# Patient Record
Sex: Female | Born: 1965
Health system: Southern US, Community
[De-identification: ages and names within clinical notes are randomized; demographics above are authoritative.]

## PROBLEM LIST (undated history)

## (undated) DIAGNOSIS — G35 Multiple sclerosis: Secondary | ICD-10-CM

## (undated) DIAGNOSIS — G259 Extrapyramidal and movement disorder, unspecified: Secondary | ICD-10-CM

## (undated) DIAGNOSIS — E119 Type 2 diabetes mellitus without complications: Secondary | ICD-10-CM

## (undated) HISTORY — PX: WISDOM TOOTH EXTRACTION: SHX21

## (undated) HISTORY — DX: Extrapyramidal and movement disorder, unspecified: G25.9

## (undated) HISTORY — PX: UTERINE FIBROID SURGERY: SHX826

---

## 1998-01-23 ENCOUNTER — Ambulatory Visit (HOSPITAL_COMMUNITY): Admission: RE | Admit: 1998-01-23 | Discharge: 1998-01-23 | Payer: Self-pay | Admitting: Obstetrics and Gynecology

## 2000-06-16 ENCOUNTER — Ambulatory Visit (HOSPITAL_COMMUNITY): Admission: RE | Admit: 2000-06-16 | Discharge: 2000-06-16 | Payer: Self-pay | Admitting: Obstetrics and Gynecology

## 2000-06-16 ENCOUNTER — Encounter: Payer: Self-pay | Admitting: Obstetrics and Gynecology

## 2000-06-21 ENCOUNTER — Encounter: Payer: Self-pay | Admitting: Obstetrics and Gynecology

## 2000-06-21 ENCOUNTER — Inpatient Hospital Stay (HOSPITAL_COMMUNITY): Admission: AD | Admit: 2000-06-21 | Discharge: 2000-06-24 | Payer: Self-pay | Admitting: Obstetrics and Gynecology

## 2000-06-21 ENCOUNTER — Encounter (INDEPENDENT_AMBULATORY_CARE_PROVIDER_SITE_OTHER): Payer: Self-pay | Admitting: Specialist

## 2000-06-22 ENCOUNTER — Encounter: Payer: Self-pay | Admitting: Obstetrics and Gynecology

## 2001-03-22 ENCOUNTER — Encounter (INDEPENDENT_AMBULATORY_CARE_PROVIDER_SITE_OTHER): Payer: Self-pay | Admitting: Specialist

## 2001-03-22 ENCOUNTER — Encounter (INDEPENDENT_AMBULATORY_CARE_PROVIDER_SITE_OTHER): Payer: Self-pay | Admitting: *Deleted

## 2001-03-22 ENCOUNTER — Inpatient Hospital Stay (HOSPITAL_COMMUNITY): Admission: RE | Admit: 2001-03-22 | Discharge: 2001-03-24 | Payer: Self-pay | Admitting: Obstetrics and Gynecology

## 2002-01-18 ENCOUNTER — Other Ambulatory Visit: Admission: RE | Admit: 2002-01-18 | Discharge: 2002-01-18 | Payer: Self-pay | Admitting: Obstetrics and Gynecology

## 2002-01-31 ENCOUNTER — Encounter: Payer: Self-pay | Admitting: Obstetrics and Gynecology

## 2002-01-31 ENCOUNTER — Ambulatory Visit (HOSPITAL_COMMUNITY): Admission: RE | Admit: 2002-01-31 | Discharge: 2002-01-31 | Payer: Self-pay | Admitting: Obstetrics and Gynecology

## 2002-10-12 ENCOUNTER — Encounter: Payer: Self-pay | Admitting: Obstetrics and Gynecology

## 2002-10-12 ENCOUNTER — Ambulatory Visit (HOSPITAL_COMMUNITY): Admission: RE | Admit: 2002-10-12 | Discharge: 2002-10-12 | Payer: Self-pay | Admitting: Obstetrics and Gynecology

## 2002-10-17 ENCOUNTER — Ambulatory Visit (HOSPITAL_COMMUNITY): Admission: RE | Admit: 2002-10-17 | Discharge: 2002-10-17 | Payer: Self-pay | Admitting: Obstetrics and Gynecology

## 2002-11-16 ENCOUNTER — Encounter: Payer: Self-pay | Admitting: Obstetrics and Gynecology

## 2002-11-16 ENCOUNTER — Ambulatory Visit (HOSPITAL_COMMUNITY): Admission: RE | Admit: 2002-11-16 | Discharge: 2002-11-16 | Payer: Self-pay | Admitting: Obstetrics and Gynecology

## 2003-01-26 ENCOUNTER — Encounter: Payer: Self-pay | Admitting: Obstetrics and Gynecology

## 2003-01-26 ENCOUNTER — Ambulatory Visit (HOSPITAL_COMMUNITY): Admission: RE | Admit: 2003-01-26 | Discharge: 2003-01-26 | Payer: Self-pay | Admitting: Obstetrics and Gynecology

## 2003-03-16 ENCOUNTER — Inpatient Hospital Stay (HOSPITAL_COMMUNITY): Admission: AD | Admit: 2003-03-16 | Discharge: 2003-03-20 | Payer: Self-pay | Admitting: Obstetrics and Gynecology

## 2003-03-17 ENCOUNTER — Encounter (INDEPENDENT_AMBULATORY_CARE_PROVIDER_SITE_OTHER): Payer: Self-pay

## 2003-03-22 ENCOUNTER — Inpatient Hospital Stay (HOSPITAL_COMMUNITY): Admission: AD | Admit: 2003-03-22 | Discharge: 2003-03-22 | Payer: Self-pay | Admitting: Obstetrics and Gynecology

## 2003-03-22 ENCOUNTER — Encounter: Payer: Self-pay | Admitting: Obstetrics and Gynecology

## 2003-06-15 ENCOUNTER — Other Ambulatory Visit: Admission: RE | Admit: 2003-06-15 | Discharge: 2003-06-15 | Payer: Self-pay | Admitting: Obstetrics and Gynecology

## 2004-10-08 ENCOUNTER — Other Ambulatory Visit: Admission: RE | Admit: 2004-10-08 | Discharge: 2004-10-08 | Payer: Self-pay | Admitting: Obstetrics and Gynecology

## 2005-01-17 ENCOUNTER — Ambulatory Visit (HOSPITAL_COMMUNITY): Admission: RE | Admit: 2005-01-17 | Discharge: 2005-01-17 | Payer: Self-pay | Admitting: Obstetrics and Gynecology

## 2005-02-18 ENCOUNTER — Ambulatory Visit (HOSPITAL_COMMUNITY): Admission: RE | Admit: 2005-02-18 | Discharge: 2005-02-18 | Payer: Self-pay | Admitting: Obstetrics and Gynecology

## 2005-03-20 ENCOUNTER — Ambulatory Visit (HOSPITAL_COMMUNITY): Admission: RE | Admit: 2005-03-20 | Discharge: 2005-03-20 | Payer: Self-pay | Admitting: Obstetrics and Gynecology

## 2005-05-14 ENCOUNTER — Inpatient Hospital Stay (HOSPITAL_COMMUNITY): Admission: AD | Admit: 2005-05-14 | Discharge: 2005-05-14 | Payer: Self-pay | Admitting: Obstetrics and Gynecology

## 2005-05-15 ENCOUNTER — Inpatient Hospital Stay (HOSPITAL_COMMUNITY): Admission: AD | Admit: 2005-05-15 | Discharge: 2005-05-15 | Payer: Self-pay | Admitting: Obstetrics and Gynecology

## 2005-05-30 ENCOUNTER — Ambulatory Visit (HOSPITAL_COMMUNITY): Admission: RE | Admit: 2005-05-30 | Discharge: 2005-05-30 | Payer: Self-pay | Admitting: Obstetrics and Gynecology

## 2005-06-18 ENCOUNTER — Ambulatory Visit (HOSPITAL_COMMUNITY): Admission: RE | Admit: 2005-06-18 | Discharge: 2005-06-18 | Payer: Self-pay | Admitting: Obstetrics and Gynecology

## 2005-08-01 ENCOUNTER — Inpatient Hospital Stay (HOSPITAL_COMMUNITY): Admission: RE | Admit: 2005-08-01 | Discharge: 2005-08-04 | Payer: Self-pay | Admitting: Obstetrics and Gynecology

## 2005-08-06 ENCOUNTER — Inpatient Hospital Stay (HOSPITAL_COMMUNITY): Admission: AD | Admit: 2005-08-06 | Discharge: 2005-08-06 | Payer: Self-pay | Admitting: Obstetrics and Gynecology

## 2005-08-07 ENCOUNTER — Encounter: Admission: RE | Admit: 2005-08-07 | Discharge: 2005-08-20 | Payer: Self-pay | Admitting: Obstetrics and Gynecology

## 2005-08-07 ENCOUNTER — Inpatient Hospital Stay (HOSPITAL_COMMUNITY): Admission: AD | Admit: 2005-08-07 | Discharge: 2005-08-07 | Payer: Self-pay | Admitting: Obstetrics and Gynecology

## 2005-08-08 ENCOUNTER — Inpatient Hospital Stay (HOSPITAL_COMMUNITY): Admission: AD | Admit: 2005-08-08 | Discharge: 2005-08-08 | Payer: Self-pay | Admitting: Obstetrics and Gynecology

## 2005-11-19 ENCOUNTER — Other Ambulatory Visit: Admission: RE | Admit: 2005-11-19 | Discharge: 2005-11-19 | Payer: Self-pay | Admitting: Obstetrics and Gynecology

## 2007-03-09 ENCOUNTER — Ambulatory Visit (HOSPITAL_COMMUNITY): Admission: RE | Admit: 2007-03-09 | Discharge: 2007-03-09 | Payer: Self-pay | Admitting: Obstetrics and Gynecology

## 2007-03-25 ENCOUNTER — Encounter: Admission: RE | Admit: 2007-03-25 | Discharge: 2007-03-25 | Payer: Self-pay | Admitting: Obstetrics and Gynecology

## 2007-04-08 ENCOUNTER — Ambulatory Visit (HOSPITAL_COMMUNITY): Admission: RE | Admit: 2007-04-08 | Discharge: 2007-04-08 | Payer: Self-pay | Admitting: Obstetrics and Gynecology

## 2008-06-13 ENCOUNTER — Encounter: Admission: RE | Admit: 2008-06-13 | Discharge: 2008-06-13 | Payer: Self-pay | Admitting: Obstetrics and Gynecology

## 2009-07-11 ENCOUNTER — Encounter: Admission: RE | Admit: 2009-07-11 | Discharge: 2009-07-11 | Payer: Self-pay | Admitting: Obstetrics and Gynecology

## 2010-10-17 ENCOUNTER — Other Ambulatory Visit (HOSPITAL_COMMUNITY): Payer: Self-pay | Admitting: Obstetrics and Gynecology

## 2010-10-17 DIAGNOSIS — Z1231 Encounter for screening mammogram for malignant neoplasm of breast: Secondary | ICD-10-CM

## 2010-10-17 DIAGNOSIS — Z1239 Encounter for other screening for malignant neoplasm of breast: Secondary | ICD-10-CM

## 2010-11-06 ENCOUNTER — Ambulatory Visit (HOSPITAL_COMMUNITY)
Admission: RE | Admit: 2010-11-06 | Discharge: 2010-11-06 | Disposition: A | Discharge status: Home / Self Care | Payer: BC Managed Care – PPO | Source: Ambulatory Visit | Attending: Obstetrics and Gynecology | Admitting: Obstetrics and Gynecology

## 2010-11-06 DIAGNOSIS — Z1231 Encounter for screening mammogram for malignant neoplasm of breast: Secondary | ICD-10-CM | POA: Insufficient documentation

## 2011-01-28 NOTE — H&P (Signed)
NAMEMAGARET, JUSTO              ACCOUNT NO.:  [id number]   MEDICAL RECORD NO.:  [id number]          PATIENT TYPE:  AMB   LOCATION:  SDC                           [facility]ILITY:  [facility]   PHYSICIAN:  Hal Morales, M.D.DATE OF BIRTH:  02-Jun-1966   DATE OF ADMISSION:  04/08/2007  DATE OF DISCHARGE:                              HISTORY & PHYSICAL   HISTORY OF PRESENT ILLNESS:  Ms. Frary is a 45 year old married  African American female, para 2-0-1-2, who presents for open laparoscopy  to remove a misplaced IUD.  The patient had a Mirena IUD placed on  October 31, 2005.  The initial followup of this IUD placement did not  reveal a string.  However, an ultrasound on November 19, 2005 showed that  the IUD was centrally located within the endometrial cavity.  A year  later, during her annual exam, it was again noted that the patient's IUD  string was not visible, and a repeat ultrasound failed to show an IUD in  the endometrial cavity.  A subsequent x-ray of the abdomen showed the  IUD in the region of the right iliac bone.  The patient presents for  surgical removal of this device.   PAST MEDICAL HISTORY:   OB HISTORY:  Gravida 3, para 2-0-1-2.  The patient had a history of  incompetent cervix.  The patient also underwent cesarean sections.   GYN HISTORY:  Menarche 45 years old.  Last menstrual period March 29, 2007.  She denies any use of contraception at this time.  Denies any  history of sexually transmitted diseases.  Does have a history of  abnormal Pap smear in 2000.  However, subsequent Pap smears have been  within normal limits.  Her last normal Pap smear was January 2006.  Her  most recent Pap smear done on June 2008, results are pending.   MEDICAL HISTORY:  1. Multiple sclerosis.  2. Uterine fibroids.   SURGICAL HISTORY:  1. In 2002, myomectomy.  2. Denies any history of blood transfusions or problems with      anesthesia.   FAMILY HISTORY:  Diabetes, hypertension,  thyroid disease, and cancer.   SOCIAL HISTORY:  The patient is married, and she currently is not  employed.   HABITS:  She does not use alcohol or tobacco.   CURRENT MEDICATIONS:  None.   ALLERGIES:  OXYCODONE, when it is given alone.  However, she is able to  take it in combination with Tylenol.  It causes her to have hives.   REVIEW OF SYSTEMS:  She denies any chest pain, shortness of breath,  abdominal pain, headache, vision changes, urinary tract symptoms,  vaginitis symptoms, irregular vaginal bleeding, abdominal pain, back  pain, and otherwise review of systems is also negative.   PHYSICAL EXAMINATION:  VITAL SIGNS:  Blood pressure 128/70, weight is  197, height is 5 feet 7 inches, pulse is 75.  NECK:  Supple, without masses.  There is no thyromegaly or cervical  adenopathy.  HEART:  Regular rate and rhythm.  There is no murmur.  LUNGS:  Clear.  There are no  wheezes, rales, or rhonchi.  BACK:  No CVA tenderness.  ABDOMEN:  Soft.  Bowel sounds are present.  It is without tenderness,  masses, or organomegaly.  EXTREMITIES:  Without clubbing, cyanosis, or edema.  PELVIC:  EGBUS is within normal limits.  Vagina is rugose.  Cervix is  nontender, without lesions.  Uterus appears normal size, shape, and  consistency, without tenderness.  Adnexa without tenderness or masses.   IMPRESSION:  Misplaced intrauterine device.   DISPOSITION:  A discussion was held with the patient regarding the  indications for her procedure, along with its risks which include but  are not limited to reaction to anesthesia, damage to adjacent organs,  infection, excessive bleeding, and the possibility that her surgery may  require an abdominal incision to complete her surgery (laparotomy).  The  patient verbalized understanding of these risks and has consented to  proceed with an open laparoscopy with the possibility of laparotomy at  [facility] on April 08, 2007 at 2:30 p.m. for  the  removal of a misplaced IUD.      Elmira J. Adline Peals.      Hal Morales, M.D.  Electronically Signed    EJP/MEDQ  D:  04/07/2007  T:  04/07/2007  Job:  [id number]

## 2011-01-28 NOTE — Op Note (Signed)
[name]              ACCOUNT NO.:  [id number]   MEDICAL RECORD NO.:  [id number]          PATIENT TYPE:  AMB   LOCATION:  SDC                           [facility]   PHYSICIAN:  Hal Morales, M.D.DATE OF BIRTH:  [birth year]   DATE OF PROCEDURE:  04/08/2007  DATE OF DISCHARGE:                               OPERATIVE REPORT   PREOPERATIVE DIAGNOSIS:  Is intra-abdominal intrauterine contraceptive  device, Mirena.   POSTOPERATIVE DIAGNOSIS:  Is intra-abdominal intrauterine contraceptive  device, Mirena.   OPERATION:  Operative laparoscopy with retrieval of intra-abdominal  Mirena intrauterine contraceptive device.   SURGEON:  Dr. Dierdre Forth   FIRST ASSISTANT:  [name].   ANESTHESIA:  General orotracheal.   ESTIMATED BLOOD LOSS:  Less than 10 mL.   COMPLICATIONS:  None.   FINDINGS:  The uterus was essentially normal size.  There was an  adhesion over the anterior surface of the uterus to the anterior  abdominal wall and then an adhesion between the fundus of the uterus and  the omentum.  The IUD was encased in filmy adhesions attached to the  omental pad.  The right tube and ovary was within normal limits.  The  left tube was within normal limits.  The left ovary contained a corpus  luteum cyst.   PROCEDURE:  The patient was taken to the operating room after  appropriate identification and placed on the operating table.  After the  attainment of adequate general anesthesia she was placed in modified  lithotomy position.  The abdomen, perineum and vagina were prepped with  multiple layers of Betadine and a Foley catheter inserted into the  bladder and connected to straight drainage.  The Graves speculum was  placed in the vagina and a single-tooth tenaculum placed on the anterior  cervix.  The abdomen and perineum was draped as a sterile field.  Subumbilical and suprapubic injections of quarter percent Marcaine for  total of 8 mL was  undertaken.  A subumbilical incision was made and  after blunt dissection the fascia identified and grasped.  It was  incised and the peritoneum then grasped and incised.  The open  laparoscopy cannula was then placed into the peritoneal cavity under  direct visualization and sutures placed on the perineum and fascia on  either side of that incision to secure of the cannula in place.  A  pneumoperitoneum was created with 3 liters of CO2.  The laparoscope was  placed through the laparoscopy cannula.  Suprapubic incisions to the  right and left of midline were made and laparoscopic probe trocars  placed through those incisions into the perineal cavity under direct  visualization.  The above-noted findings were made and documented.  The  IUD was identified and then grasped with atraumatic graspers.  A  combination of blunt and sharp dissection was used to dissect the IUD  from the overlying filmy adhesions.  Once it was free in the peritoneal  cavity the IUD string were brought through the laparoscopic trocar  sleeve and removed.  Hemostasis was noted to be adequate and copious  irrigation was carried out with approximately 50 mL of lactated Ringer's  left in the peritoneal cavity.  All instruments were then removed from  the peritoneal cavity under direct visualization as the CO2 was allowed  to escape.  The sutures that had been placed in the fascia upon entry  into the peritoneal cavity were then used to close the fascia in figure-  of-eight fashion with 0 Vicryl sutures.  The skin incision was then  closed with subcuticular sutures of 3-0 Monocryl both of the umbilical  and suprapubic region.  Sterile dressings were applied, the Foley  catheter removed, and the single-tooth tenaculum removed as well.  The  patient was awakened from general anesthesia and taken to the recovery  room in satisfactory condition having tolerated the procedure well with  sponge and instrument counts correct.   The Mirena IUD was given to the  patient.      Hal Morales, M.D.  Electronically Signed     VPH/MEDQ  D:  04/08/2007  T:  04/09/2007  Job:  [id number]

## 2011-01-31 NOTE — Discharge Summary (Signed)
[facility]  Patient:    [name], [name]                     MRN: [id number] Adm. Date:  [id number] Disc. Date: [id number] Attending:  Shaune Spittle Dictator:   Vance Gather Duplantis, C.N.M.                           Discharge Summary  ADMISSION DIAGNOSES:          1. Intrauterine pregnancy at 18-2/7 weeks.                               2. Spontaneous rupture of membranes.                               3. Cord prolapse.                               4. Multiple large fibroids.  DISCHARGE DIAGNOSES:          1. Intrauterine pregnancy at 18-2/7 weeks.                               2. Spontaneous rupture of membranes.                               3. Cord prolapse.                               4. Multiple large fibroids.                               5. Positive group B streptococcus.                               6. Undecided regarding contraception.  PROCEDURES THIS ADMISSION:    1. Spontaneous of a nonviable fetus at                                  approximately [redacted] weeks gestation at 6 p.m. on                                  June 22, 2000.  Attended by Georgina Peer, M.D.                               2. Dilatation and curettage for retained                                  placenta on June 23, 2000, also by Jomarie Longs  Dora Sims, M.D.  HISTORY OF PRESENT ILLNESS:   [name] is a 45 year old, married, black female, gravida 1, para 0, at 18-2/7 weeks by LMP and confirmed by ultrasound, who presents complaining of a pink discharge and a questionable gush of fluid earlier in the evening.  She also reports onset of cramps earlier in the day which had resolved after the gush of fluid.  Upon evaluation, she was noted to have cord prolapse and a fetal hand in the vagina.  HOSPITAL COURSE:              She was admitted for observation and eventually to proceed with induction  of labor once nonviability was demonstrated on ultrasound.  She received Cytotec for induction and eventually delivered at 6 p.m. on June 22, 2000, but had a retained placenta and underwent D&C for removal of that on June 23, 2000, at approximately midnight.  She was attended by Georgina Peer, M.D., for delivery and [facility].  Please see the operative note for details.  Postoperatively, she has done well.  She is now afebrile, ambulating, voiding, and eating without difficulty.  She is emotionally coping well and has good support systems at home.  She is deemed ready for discharge today.  DISCHARGE INSTRUCTIONS:       To call for temperature, heavy bleeding, or any other concerns or problems.  DISCHARGE LABORATORIES:       Her hemoglobin is 11.6, her WBC count is 13.0, and her platelet count is 242.  Group B streptococcus was positive from this hospital admission.  DISCHARGE MEDICATIONS:        1. Motrin 600 mg one p.o. q.6h. p.r.n. for pain.                               2. Tylox one to two p.o. q.4-6h. p.r.n. for                                  pain.                               3. Prenatal vitamins daily.  DISCHARGE FOLLOW-UP:          In two to three weeks with Erie Noe P. Haygood, M.D., for exam and discussion of future plans and/or contraception. DD:  06/24/00 TD:  06/25/00 Job: [id number]

## 2011-01-31 NOTE — Op Note (Signed)
NAMEMARIBELLA, [name]              ACCOUNT NO.:  [id number]   MEDICAL RECORD NO.:  [id number]          PATIENT TYPE:  AMB   LOCATION:  SDC                           [facility]   PHYSICIAN:  Hal Morales, M.D.DATE OF BIRTH:  16-Jul-1966   DATE OF PROCEDURE:  02/18/2005  DATE OF DISCHARGE:                                 OPERATIVE REPORT   PREOPERATIVE DIAGNOSES:  1.  Intrauterine pregnancy at [redacted] weeks gestation.  2.  Incompetent cervix.  3.  Uterine fibroids.  4.  Status post myomectomy.  5.  Status post cesarean section for previous pregnancy.   POSTOPERATIVE DIAGNOSES:  1.  Intrauterine pregnancy at [redacted] weeks gestation.  2.  Incompetent cervix.  3.  Uterine fibroids.  4.  Status post myomectomy.  5.  Status post cesarean section for previous pregnancy.   OPERATION:  Cervical cerclage.   SURGEON:  Hal Morales, M.D.   ANESTHESIA:  Spinal.   ESTIMATED BLOOD LOSS:  Less than 25 mL.   COMPLICATIONS:  None.   FINDINGS:  The cervix was dilated to 1 cm at the external os. The internal  os was fingertip dilated. The cervix was slightly shortened.   PROCEDURE:  The patient was taken to the operating room after appropriate  identification and placed on the operating table. After placement of a  spinal anesthetic, she was placed in the lithotomy position. The perineum  and vagina were prepped with multiple layers of Betadine and draped as a  sterile field. A red Robinson catheter was used into the bladder. A weighted  speculum was placed in the posterior vagina and the Deaver used to elevate  the bladder. A  Deschamps clamp was placed on the anterior cervix and it was  noted to be foreshortened. The cervicovaginal mucosa was incised and the  bladder dissected sharply off the anterior cervix to the level of the  internal os. A 5 mm Mersilene suture was then used to place a pursestring  suture at the level of the internal os and that suture was tied posteriorly.  It  was cinched down to the point that the internal os was less than a  fingertip dilated. The vaginal mucosa was then repaired with a running  suture of 2-0 Vicryl. Hemostasis was noted to be adequate and the patient  tolerated the procedure well. She was taken from the operating room to the  recovery room in satisfactory condition having tolerated procedure well with  sponge and instrument counts correct.   DISCHARGE INSTRUCTIONS:  The patient is discharged home on bed rest level  III for a total of two weeks. She is to take ibuprofen 600 mg p.o. q.6h. for  three days and then q.6h. p.r.n. cramps. She is to call for menstrual-like  cramping, uterine contractions, low dull back ache not relieved by heat or  Tylenol, intestinal cramps with or without diarrhea, pelvic pressure,  increase change in vaginal discharge, vaginal bleeding or leaking of fluid.   FOLLOW-UP:  She will follow-up in the office with me in two weeks.       VPH/MEDQ  D:  02/18/2005  T:  02/18/2005  Job:  [id number]

## 2011-01-31 NOTE — Consult Note (Signed)
[name], [name]                        ACCOUNT NO.:  [id number]   MEDICAL RECORD NO.:  [id number]                   PATIENT TYPE:  MAT   LOCATION:  MATC                                 [facility]   PHYSICIAN:  Janine Limbo, M.D.            DATE OF BIRTH:  1965-11-19   DATE OF CONSULTATION:  03/22/2003  DATE OF DISCHARGE:                                   CONSULTATION   HISTORY OF PRESENT ILLNESS:  The patient is a 45 year old female, now para 1-  0-1-1, who had a primary cesarean section on March 17, 2003.  The patient was  followed for her pregnancy at the [facility]. The patient's pregnancy was  complicated by a history of a prior myomectomy. The patient also has a  history of multiple sclerosis, advanced maternal age, and possible  incompetent cervix. The patient had a prior pregnancy loss at [redacted] weeks  gestation. During labor the patient had a nonreassuring fetal heart rate  tracing and was subsequently found to have an occult cord prolapse. She  tolerated her cesarean section well. Her postoperative course was  uneventful. She was discharged to home on postoperative day three. The  patient reports that on March 21, 2003, she started noticing swelling in her  legs and in her hands. She experienced some shortness of breath. She also  complained of chills. She presented for evaluation at the Maternity  Admissions Unit here at [facility].   OBSTETRICAL HISTORY  Please see history of present illness.   PAST MEDICAL HISTORY:  The patient has a history of multiple sclerosis as  mentioned above. She also has had a prior myomectomy. She has also had her  wisdom teeth removed.   ALLERGIES:  The patient reports that she is allergic to OXYCODONE and that  this causes hives. However she is currently taking oxycodone and does not  have any hives. She reports that the medicine does not work well  for her.   FAMILY HISTORY:  The patient's mother, aunt, and grandmother have  hypertension. Her mother also has thyroid disease.   SOCIAL HISTORY:  The patient denies alcohol use, tobacco use, and  recreational drug use.   REVIEW OF SYSTEMS:  The patient complains of mild diarrhea.   PHYSICAL EXAMINATION:  VITAL SIGNS: Her temperature is 98.6, her pulse is  between 50 and 55.  Respirations 20, blood pressure is 150/72 and 143/63.  HEENT: Within normal limits. The patient is in no acute distress.  LUNGS: Clear in most quadrants but there is slight decrease breath sounds in  the right lower quadrant.  HEART: Regular rate and rhythm. No gallops are noted.  BREASTS: Nontender and no lesions are noted.  ABDOMEN: Soft and her incision is clean.  EXTREMITIES: Show 1 to 2+ edema.  NEUROLOGY: There is no clonus present. The patient is slightly  hyperreflexic.  LABORATORY DATA:  Hemoglobin is 9.1, white blood cell count is 9900,  platelet count is 314,000. Her chemistry evaluation is within normal limits  except for an SGOT of 60.   Chest x-ray  show mild interstitial edema and a mild right pleural effusion.  The heart size appears normal.   ASSESSMENT:  1. Status post cesarean section on March 17, 2003.  2. Fluid shifts that is normal for postpartum patients.   PLAN:  The patient was offered in-hospital management. She elects to proceed  with outpatient management. She will be given Lasix 20 mg to take three  times each day for two days. Her potassium is 4.0 today. She will return to  the office on March 23, 2003 for evaluation. She understands that she can call  anytime tonight should she have any difficulties or concerns. Her oxygen  saturation in the emergency room is between 93 and 98%  on room air. The  patient was given a prescription for Darvocet-N 100 and she will take one  tablet every four to six hours as needed for pain in addition to her  ibuprofen.                                                Janine Limbo, M.D.    AVS/MEDQ  D:  03/22/2003  T:  03/22/2003  Job:  [id number]   cc:   Janine Limbo, M.D.  [address]  [place]  [place] [address]  Fax: [phone]

## 2011-01-31 NOTE — H&P (Signed)
NAMEARTURO, [name]              ACCOUNT NO.:  [id number]   MEDICAL RECORD NO.:  [id number]          PATIENT TYPE:  INP   LOCATION:  NA                            [facility]   PHYSICIAN:  Hal Morales, M.D.DATE OF BIRTH:  1966/03/27   DATE OF ADMISSION:  08/01/2005  DATE OF DISCHARGE:                                HISTORY & PHYSICAL   This is a 45 year old gravida 3, para 1-0-1-1, who will be 38-1/7 weeks on  day of surgery.  She presents for an elective repeat cesarean section and  cerclage removal and tubal ligation to be performed by Dr. Pennie Rushing.  Pregnancy has been followed by Dr. Pennie Rushing and remarkable for:  (1) AMA.  (2) Multiple sclerosis.  (3) History of second trimester loss.  (4) History  of myomectomy.  (5) Previous cesarean section.  (6) UTI.  (7) Increased down  syndrome risks.   PAST OBSTETRICAL HISTORY:  Remarkable for vaginal delivery in 2001 of an 18-  week gestational baby remarkable for preterm premature rupture of membranes  and cord prolapse.  She did receive a D&C for the placenta after that  delivery.  She then had a cesarean delivery in 2004 of a female infant at [redacted]  weeks gestation weighing 8 pounds 5 ounces remarkable for cerclage and  occult cord prolapse.   PAST MEDICAL HISTORY:  Remarkable for a history of multiple fibroids with a  history of myomectomy, history of childhood varicella, and multiple  sclerosis which was diagnosed in 1997, treated with steroids at one time,  but no medications currently.   PAST SURGICAL HISTORY:  Remarkable for wisdom teeth extraction, myomectomy,  cerclage, and cesarean section.   FAMILY HISTORY:  Remarkable for hypertension in her mother and grandmother.  Varicosities in her aunt.  Type 2 diabetes in her aunt.  Thyroid dysfunction  in her mother.  Unknown type of cancer in her grandfather and cigarette use  in her father.  Genetic history is unremarkable except for increased down  syndrome risk on quad  screen this pregnancy.   SOCIAL HISTORY:  The patient is married to Nino Glow who is involved  and supportive.  She does not report a religious affiliation.  She works in  Audiological scientist.  She denies any alcohol, tobacco, or drug use.   HISTORY OF CURRENT PREGNANCY:  The patient entered care at [redacted] weeks  gestation.  She had an ultrasound at 11 weeks showing 4.3 cm of cervical  length.  Cerclage was done in June.  She had a quad screen at 16 weeks  showing down syndrome risk of 1:29.  She declined amnio at that time.  She  had a Glucola at 26 weeks which was normal.  She had an ultrasound at 29  weeks showing average growth and again at 31 weeks showing average growth  50th to 75th percentile.  She had Group B Strep cultures which were negative  at term and had an ultrasound at term showing average growth.   OBJECTIVE:  VITAL SIGNS:  Stable and afebrile.  HEENT:  Within normal limits.  Thyroid normal, not  enlarged.  CHEST:  Clear to auscultation.  HEART:  Regular rate and rhythm.  ABDOMEN:  Gravid, 38 cm, vertex to Leopold's.  Fetal heart rate 138.  PELVIC:  Declined.  EXTREMITIES:  Within normal limits.   ASSESSMENT:  1.  Intrauterine pregnancy at 38-1/7 weeks.  2.  Previous cesarean section and myomectomy.  3.  Cerclage.  4.  Elective repeat cesarean section.   PLAN:  Admit to operating suite for elective cesarean section, bilateral  tubal ligation, and cerclage removal per Dr. Pennie Rushing.      Marie L. Williams, C.N.M.      Hal Morales, M.D.  Electronically Signed    MLW/MEDQ  D:  07/28/2005  T:  07/28/2005  Job:  [id number]

## 2011-01-31 NOTE — Discharge Summary (Signed)
[name], [name]                        ACCOUNT NO.:  [id number]   MEDICAL RECORD NO.:  [id number]                   PATIENT TYPE:  INP   LOCATION:  9103                                 FACILITY:   PHYSICIAN:  Christina A. Dillard, M.D.              DATE OF BIRTH:  [birth year]   DATE OF ADMISSION:  03/16/2003  DATE OF DISCHARGE:  03/20/2003                                 DISCHARGE SUMMARY   ADMISSION DIAGNOSES:  1. Intrauterine pregnancy at term.  2. Active labor.  3. History of myomectomy.  4. History of cerclage with this pregnancy.  5. Multiple sclerosis.   DISCHARGE DIAGNOSES:  1. Intrauterine pregnancy at term.  2. Active labor.  3. History of myomectomy.  4. History of cerclage with this pregnancy.  5. Multiple sclerosis.  6. Non reassuring fetal heart rate tracing, status post primary low     transverse cesarean section, breastfeeding, and undecided regarding     contraception.   PROCEDURES THIS ADMISSION:  Primary low transverse cesarean section for  delivery of a viable female infant named Charity who weighed 8 pounds and 5  ounces and had Apgars of 8 and 9 on March 17, 2003, attended in delivery by  Dr. Dierdre Forth and Wynelle Bourgeois, C.N.M.   HOSPITAL COURSE:  [name] is a 45 year old black female, gravida 2, para  0-1-0-0, at [redacted] weeks gestation who presented from the office secondary to  advanced cervical dilation and early active labor.  She was four to five cm  on admission and progressed to five to six about an hour later.  Subsequently her uterine contractions dissipated to about every eight  minutes and her water was ruptured with clear fluid noted and she was  started on Pitocin a couple of hours later because uterine contractions were  still irregular and she has not changed her cervix any further.  By midnight  that night, the patient was noted to have late decelerations with uterine  contractions every four to five minutes and her cervix was  still 7 cm, 90%,  and a minus 1 station.  At this point, amnio infusion was started and IUPC  was placed to document adequacy of uterine contractions and the plan was to  reassess in an hour.  Approximately an hour later, it was noted that she  continued to have late decelerations with variables and the recommendation  was made for her to progress to a cesarean section for delivery.  The risks  and benefits were discussed with patient and husband and she consented to  proceed with that.  She underwent the same for delivery of a viable female  infant named Charity who weighed 8 pounds, 5 ounces, and had Apgars of 8 and  9 on March 17, 2003.  Please see the operative note for further details.  Postoperatively, the patient has done well.  She is ambulating, voiding, and  eating without difficulty.  Her vital signs have been stable and she has  remained afebrile throughout her hospital stay.  She is breast feeding.  She  is undecided regarding contraception.  She is deemed ready for discharge  today.   DISCHARGE INSTRUCTIONS:  As per the [facility] handout.   DISCHARGE MEDICATIONS:  1. Motrin 600 mg p.o. q. 6h p.r.n. for pain.  2. Tylox one to two p.o. q. 4-6h p.r.n. for pain.  3. Prenatal vitamins daily.   DISCHARGE LABS:  Hemoglobin is 9.5.  WBC count is 13.2.  Platelets are 231.   DISCHARGE FOLLOWUP:  Will be in six weeks at [facility] or  p.r.n.     [name], C.N.M.              [name], M.D.    SJD/MEDQ  D:  03/20/2003  T:  03/20/2003  Job:  [id number]

## 2011-01-31 NOTE — Discharge Summary (Signed)
[facility]  Patient:    [name], [name]                     MRN: [id number] Adm. Date:  [id number] Disc. Date: 03/24/01 Attending:  Shaune Spittle Dictator:   Henreitta Leber, P.A.-C.                           Discharge Summary  DISCHARGE DIAGNOSES:          1. Large uterine fibroid.                               2. History of second trimester loss.  OPERATION ON March 22, 2001:    The patient underwent an abdominal myomectomy which yielded 10 uterine fibroids ranging in size from 9 cm x 5 cm to 6 mm x 3 mm. The patient tolerated the procedure well.  HISTORY OF PRESENT ILLNESS:   Ms. Bohannon is a 45 year old married, African-American female, para 1-0-1-0, with a history of multiple sclerosis, infertility and long history of uterine fibroids who presents for myomectomy. Please see patients dictated history and physical examination for details.  PHYSICAL EXAMINATION:         Vital signs: Blood pressure 100/70, weight is 202 pounds, height is 5 feet 6-3/4 inches tall. General exam is within normal limits with the exception of the abdomen which reveals a firm, nontender mass arising from the pelvis to two fingerbreadths below the umbilicus. The patient has no organomegaly. Pelvic exam: EGBUS is within normal limits. The vagina is rugous. The cervix is no nontender without lesions. The uterus is approximately 18 to 20 weeks size, firm, and nontender. Adnexa without masses. The patients uterus extends partially bilaterally into her adnexal region; however, there are no separable masses. Rectovaginal confirms.  HOSPITAL COURSE:              On March 22, 2001 the patient underwent abdominal myomectomy yielding 10 uterine fibroids ranging from 9 cm x 5 cm to 6 mm x 3 mm, tolerating procedure well. Postoperative course was unremarkable with patient quickly resuming bowel and bladder function by postoperative day #2 and being ready for discharge home.  Postoperative hemoglobin 10.3 (preoperative hemoglobin 12.8).  DISCHARGE MEDICATIONS:        1. Darvocet-N 100 one tablet every four hours                                  as needed for pain.                               2. Ibuprofen 600 mg one tablet every six hours                                  with food for five days then as needed.                               3. Iron 325 mg one tablet twice daily for  six weeks.                               4. Phenergan 12.5 mg one tablet four times a                                  day, if needed, for nausea.                               5. Stool softener 100 mg one tablet twice daily                                  until bowel movements are regular.  DISCHARGE FOLLOWUP:           The patient is scheduled to have staples removed at [facility] on March 26, 2001 at 10:30 a.m. She also has a six week postoperative exam with Dr. Dierdre Forth on May 04, 2001 at 2: 30 p.m.  DISCHARGE INSTRUCTIONS:       The patient was given a copy of [facility] postoperative instruction sheet. She was further advised to avoid driving for two weeks, heavy lifting for four weeks, and intercourse for six weeks.  FINAL PATHOLOGY:              Uterine fibroids; fragments of leiomyomata with extensive degenerative changes. DD:  03/24/01 TD:  03/24/01 Job: [id number]

## 2011-01-31 NOTE — H&P (Signed)
NAMESHELTON, SOLER                        ACCOUNT NO.:  [id number]   MEDICAL RECORD NO.:  [id number]                   PATIENT TYPE:  MAT   LOCATION:  MATC                                 [facility]ILITY:  [facility]   PHYSICIAN:  Hal Morales, M.D.             DATE OF BIRTH:  06-May-1966   DATE OF ADMISSION:  03/16/2003  DATE OF DISCHARGE:                                HISTORY & PHYSICAL   HISTORY OF PRESENT ILLNESS:  This is a 45 year old gravida 2, para 0-0-1-0,  at 39 weeks who presents from the office with advanced cervical dilation.  Her pregnancy has been followed by Dr. Pennie Rushing, and remarkable for multiple  sclerosis, advanced maternal age, history of myomectomy, history of second  trimester loss at 18 weeks, increased Down syndrome risk per AFT, Cerclage  which was removed two weeks ago.   OBSTETRICAL HISTORY:  Vaginal delivery in 2001, of an 18 week baby,  remarkable for preterm premature rupture of membranes and cord prolapse.  She did receive a D&C for the placenta after that delivery.   PAST MEDICAL HISTORY:  1. History of multiple fibroids with a history of myomectomy.  2. History of childhood Varicella.  3. Multiple sclerosis which was diagnosed in 1997, treated with steroids at     one time, but no medications currently.   PAST SURGICAL HISTORY:  1. Wisdom teeth extraction.  2. Myomectomy.   FAMILY HISTORY:  Hypertension in her mother, aunt, and grandmother.  Varicosities in her aunt.  Type 2 diabetes in her aunt.  Thyroid dysfunction  in her mother.  Unknown type of cancer in her grandfather.  Cigarette use in  her father.   GENETIC HISTORY:  Unremarkable.   SOCIAL HISTORY:  The patient is married to Nino Glow who is involved  and supportive.  She does not report a religious affiliation.  She works in  Audiological scientist.  She denies any alcohol, tobacco, or drug use.   PHYSICAL EXAMINATION:  VITAL SIGNS:  Stable, afebrile.  HEENT:  Within normal limits.  NECK:   Thyroid normal, not enlarged.  CHEST:  Clear to auscultation.  HEART:  Regular rate and rhythm.  ABDOMEN:  Gravid at 39 cm, vertex [place].  EFM shows fetal heart rate of  140's with small accelerations, uterine contractions every 5 to 8 minutes  which are mild to moderate.  PELVIC:  Cervical exam was 4 to 5 cm, 90% effaced, -1 station, vertex  presentation in the office today.  EXTREMITIES:  Within normal limits.   ASSESSMENT:  1. Intrauterine pregnancy at 39 weeks.  2. Early labor with advanced cervical dilation.    PLAN:  1. Admit to birthing suite as per Dr. Pennie Rushing.  2. Routine M.D. orders and further orders to follow.     Marie L. Williams, C.N.M.                 Hal Morales, M.D.  MLW/MEDQ  D:  03/16/2003  T:  03/16/2003  Job:  [id number]

## 2011-01-31 NOTE — H&P (Signed)
[facility]  Patient:    [name], [name]                       MRN: [id number] Attending:  Maris Berger. Pennie Rushing, M.D. Dictator:   Henreitta Leber, P.A.                         History and Physical  DATE OF BIRTH:                06/13/1966  HISTORY OF PRESENT ILLNESS:   Ms. Kaleta is a 45 year old, married, African-American female, para 1-0-1-0, with a history of multiple sclerosis, infertility, and greater than five-year history of uterine fibroids.  The patient experienced a spontaneous rupture of membranes at 18 weeks in October of 2001, which resulted in the birth of a nonviable infant and retained placenta, which was removed by D&C.  The patients menstrual periods are every 28 days with moderate bleeding and only minimal cramping.  A pelvic ultrasound on September 23, 2000, revealed five uterine fibroids ranging in size from 2 cm to 7.7 cm.  In view of the patients obstetrical history and desire to achieve a pregnancy, the patient has consented to undergo an abdominal myomectomy.  OBSTETRICAL HISTORY:          Gravida 1, para 1-0-1-0; the patient has a history of a second trimester lost.  GYNECOLOGICAL HISTORY:        Menarche in "early teens."  The patients monthly periods are monthly with only moderate bleeding and minimal cramping. She denies any history of sexually transmitted diseases.  Currently is using condoms as a method of contraception.  History of abnormal Pap smear in the year 2000, however, subsequent Pap smears have been normal with her most recently being in August of 2001.  PAST MEDICAL HISTORY:         Multiple sclerosis, which is inactive (diagnosed in October of 1997).  PAST SURGICAL HISTORY:        Positive for D&C for retained placenta.  She denies any history of blood transfusions.  FAMILY HISTORY:               Positive for hypertension, diabetes, and colon cancer.  SOCIAL HISTORY:               The patient is married and she  is employed as a Counselling psychologist.  CURRENT MEDICATIONS:          None.  ALLERGIES:                    Oxycodone which causes hives.  HABITS:                       She does not use alcohol or tobacco.  REVIEW OF SYSTEMS:            Positive for urinary frequency, otherwise negative.  PHYSICAL EXAMINATION:         Vital signs with blood pressure 100/70.  WEIGHT:                       202 pounds.  HEIGHT:                       5 feet 6-3/4 inches tall.  HEENT:  Exam is within normal limits.  The thyroid is not enlarged.  HEART:                        Regular rate and rhythm.  There is no murmur.  LUNGS:                        Without wheezes, rales, or rhonchi.  BACK:                         No CVA tenderness.  ABDOMEN:                      Bowel sounds are present.  It is soft.  The patient does, however, have a firm, nontender mass arising from the pelvis to two fingerbreadths below the umbilicus.  She has no organomegaly.  EXTREMITIES:                  There is no clubbing, cyanosis, or edema.  PELVIC:                       EG and BUS within normal limits.  The vagina is rugous.  The cervix is nontender without lesions.  The uterus is approximately 18-20 weeks size, firm, and nontender.  Adnexa with no masses.  The patients uterus extends partially bilaterally into her adnexal regions.  Rectovaginal confirms.  IMPRESSION:                   1. Large uterine fibroids.                               2. History of second trimester loss.  DISPOSITION:                  A discussion was held with the patient regarding the implications for her procedure along with the risks involved to include reaction to anesthesia, infection, bleeding, and damage to adjacent organs. The patient also understands that hysterectomy may become necessary should unforeseen complications develop.  The patient has consented to undergo an abdominal myomectomy at [facility]  [facility] [place], [place], on Wednesday, March 24, 2001, at 10 a.m. DD:  03/19/01 TD:  03/19/01 Job: [id number]

## 2011-01-31 NOTE — Op Note (Signed)
NAMEAVAYA, MCJUNKINS              ACCOUNT NO.:  [id number]   MEDICAL RECORD NO.:  [id number]          PATIENT TYPE:  INP   LOCATION:  9122                          [facility]ILITY:  [facility]   PHYSICIAN:  Hal Morales, M.D.DATE OF BIRTH:  July 10, 1966   DATE OF PROCEDURE:  08/01/2005  DATE OF DISCHARGE:                                 OPERATIVE REPORT   PREOPERATIVE DIAGNOSES:  1.  Intrauterine pregnancy at term.  2.  History of incompetent cervix with cerclage in place.  3.  History of prior cesarean section with desire for repeat cesarean      section.  4.  History of myomectomy.  5.  Multiple sclerosis, asymptomatic   POSTOPERATIVE DIAGNOSES:  1.  Intrauterine pregnancy at term.  2.  History of incompetent cervix with cerclage in place.  3.  History of prior cesarean section with desire for repeat cesarean      section.  4.  History of myomectomy.  5.  Multiple sclerosis, asymptomatic   OPERATION:  Cervical cerclage removal and repeat low transverse cesarean  section.   SURGEON:  Dr. Dierdre Forth   FIRST ASSISTANT:  Wynelle Bourgeois, CNM   ANESTHESIA:  Spinal.   ESTIMATED BLOOD LOSS:  750 mL.   COMPLICATIONS:  None.   FINDINGS:  The uterus, tubes, and ovaries were normal for the gravid state.  The patient was delivered of a female infant whose name is Leeroy Bock, weighing  6 pounds 11 ounces with Apgars of 9 and 10 at one and five minutes,  respectively.   PROCEDURE:  The patient was taken to the operating room after appropriate  identification and placed on the operating table.  After the attainment of  adequate spinal anesthesia, she was placed in the supine position with a  left lateral tilt.  The legs were abducted and a Graves speculum placed in  the vagina.  The cervical cerclage was then removed with a uterine dressing  forceps and long scissors.  The abdomen and perineum were then prepped with  multiple layers of Betadine and a Foley catheter inserted into the  bladder  under sterile conditions and connected to straight drainage.  The abdomen  was draped as a sterile field.  After assurance of adequate anesthesia, the  site of the previous cesarean section incision was infiltrated with 20 mL of  0.25% Marcaine.  A suprapubic incision was made at that site and the abdomen  opened in layers.  The peritoneum was entered and the bladder blade placed.  The uterus was incised approximately 2 cm above the uterovesical fold and  the uterine incision taken laterally on either side bluntly.  The membranes  were ruptured with clear fluid and the infant delivered from the right  occiput transverse position.  The nares and pharynx were suctioned and the  cord clamped and cut.  The infant was handed off to the awaiting  pediatricians.  The appropriate cord blood was drawn and the placenta noted  to have separated from the uterus and was removed from the operative field.  The uterine incision was closed with a running interlocking  suture of 0  Vicryl.  An imbricating suture of 0 Vicryl was placed.  A single hemostatic  suture was placed in the fundus at the site of a disruption of the serosa.  Copious irrigation was carried out and hemostasis noted to be adequate.  The  abdominal peritoneum was closed with a suture of 2-0 Vicryl.  The rectus  muscles were reapproximated in the midline with figure-of-eight suture of 2-  0 Vicryl.  The rectus fascia was closed with a running suture of 0 Vicryl  then reinforced on either side of midline with figure-of-eight sutures of 0  Vicryl.  The subcutaneous tissue was made hemostatic with Bovie cautery and  irrigated.  Subcuticular sutures were used to close the skin incision.  Sterile Steri-Strips were applied.  A sterile dressing was applied and the  patient taken from the operating room to the recovery room in satisfactory  condition, having tolerated the procedure well with sponge and instrument  counts correct.  The  infant went to the full-term nursery.      Hal Morales, M.D.  Electronically Signed     VPH/MEDQ  D:  08/01/2005  T:  08/01/2005  Job:  [id number]

## 2011-01-31 NOTE — Op Note (Signed)
[facility]  Patient:    [name], [name]                     MRN: [id number] Proc. Date: 03/22/01 Adm. Date:  [id number] Attending:  Dierdre Forth [place]                           Operative Report  PREOPERATIVE DIAGNOSES:       1. Large uterine fibroids.                               2. History of second trimester pregnancy loss.  POSTOPERATIVE DIAGNOSES:      1. Large uterine fibroids.                               2. History of second trimester pregnancy loss.  OPERATION:                    Multiple abdominal myomectomy.  SURGEON:                      Vanessa P. Pennie Rushing, M.D.  ASSISTANT:                    Henreitta Leber, P.A.C.  ANESTHESIA:                   General orotracheal anesthesia.  ESTIMATED BLOOD LOSS:         300 cc.  COMPLICATIONS:                None.  FINDINGS:                     The uterus was enlarged to approximately 16 weeks size with multiple myomata. The tubes and ovaries appeared normal.  Ten myomata were removed, ranging in size from 6 x 3 mm to 9 x 5 cm.  DESCRIPTION OF PROCEDURE:     The patient was taken to the operating room after appropriate identification and placed on the operating table.  After obtaining adequate general anesthesia, the abdomen, perineum and vagina were prepped with multiple layers of Betadine.  A Foley catheter was inserted into the bladder and connected to straight drainage. The abdomen was draped as a sterile field. Transverse incision was made in the abdomen and the abdomen opened in layers.  The peritoneum was entered and peritoneal washings obtained. The upper abdomen and pelvis were examined with normal kidneys and liver edge palpated and no other masses other than those mentioned on the uterus.  The uterus was then elevated into the operative field and a combination of blunt and sharp dissection used to dissect adherent omentum off the uterus. The largest of the myomata were in  the left fundal region posteriorly and adjacent to the left tube and ovary. The overlying serosa was injected with a dilute solution of Pitressin and incised.  A combination of blunt and sharp dissection were used to remove those two myomata through a single incision.  A third myomata was removed from the subserosal area of that incision and the myoma bed closed with interrupted sutures of 0 Vicryl.  The serosa was closed with a running suture of 3-0 Vicryl.  A second incision was made in the fundus of the uterus  from anterior to posterior overlying two large myomata.  A combination of blunt and sharp dissection was used to remove those myomata from the myoma bed and a third myoma removed subserosally from the cut edge.  Sutures of 0 Vicryl were used to close the myoma bed and the serosa was closed with a running suture of 3-0 Vicryl.  Four additional myomata were noted in the left anterior fundus and the serosa overlying these was injected with a dilute solution of Pitressin and incised.  A combination of blunt and sharp dissection were used to remove these myomata and sutures of 0 Vicryl were used to close the myoma bed and provide hemostasis and the serosa closed with a suture of 3-0 Vicryl.  Hemostatic sutures of 3-0 Vicryl were placed in the three incisions to allow for adequate hemostasis.  Copious irrigation was carried out.  The incisions were each covered with a strip of Interceed which was sewn down and the uterus returned to the pelvic cavity. The abdominal peritoneum was then closed with a running suture of 2-0 Vicryl. The rectus muscles were made hemostatic with Bovie cautery and irrigated. The rectus fascia was closed with running sutures of 0 Vicryl from each apex to the midline and tied in the midline.  The subcutaneous tissue was made hemostatic with Bovie cautery and irrigated. Skin staples were applied to the skin incision. Sterile dressing was applied and the patient was  awakened from general anesthesia and taken to the recovery room in satisfactory condition, having tolerated the procedure well with sponge and instrument counts correct.  SPECIMENS TO PATHOLOGY:       Peritoneal washings and 10 uterine fibroids. DD:  03/22/01 TD:  03/22/01 Job: [id number]

## 2011-01-31 NOTE — H&P (Signed)
[facility]  Patient:    [name], [name]                       MRN: [id number] Adm. Date:  06/21/00 Attending:  Erie Noe P. Pennie Rushing, M.D. Dictator:   Vance Gather Duplantis, C.N.M.                         History and Physical  HISTORY OF PRESENT ILLNESS:   [name] is a 45 year old married black female, gravida 1, para 0, at 18-2/7ths weeks by LMP, confirmed by ultrasound on June 16, 2000, who presents complaining of some pink-reddish spotting around 2:30 this morning and a possible gushing of yellow fluid in a small amount at 8:20 p.m.  She reports that she did have cramping all day previously prior to that gush of fluid.  Subsequent to the episode of fluid, she has had no further fluid or cramping.  She denies any nausea, vomiting, headaches or visual disturbances.  She reports no problems with her pregnancy up to this date except some episodes of vaginal bleeding and a history of fibroids.  Her pregnancy is also at risk for advanced maternal age and multiple sclerosis.  GYNECOLOGICAL HISTORY:        She is a primigravida.  She is reported to have multiple fibroids.  GENERAL MEDICAL HISTORY:      She has no known drug allergies.  She reports having had the usual childhood diseases and multiple sclerosis that was diagnosed in 1997, but no medications currently.  FAMILY HISTORY:               Significant for a mother with hypertension, paternal aunt with hypertension, paternal aunt with type 2 insulin-dependent diabetes, mom with thyroid dysfunction.  GENETIC HISTORY:              Negative other than that she is age 75 currently.  SOCIAL HISTORY:               She is married to Nino Glow who is involved and supportive.  They are both employed full-time.  They deny any illicit drug use, alcohol or smoking with this pregnancy.  PRENATAL LABORATORY:          Her prenatal labs are currently unavailable, although her rubella is  equivocal.  PHYSICAL EXAMINATION:  VITAL SIGNS:                  Her temperature is 98.8, pulse is 93, respirations are 20, blood pressure is 105/70.  HEENT:                        Grossly within normal limits.  HEART:                        Regular rhythm and rate.  CHEST:                        Clear.  BREASTS:                      Soft and nontender.  ABDOMEN:                      A fundal height is about 26 cm, irregular and fibroids are palpable.  Fetal heart tones were audible with Doppler at 150s.  PELVIC:  A sterile speculum exam reveals a prolapsed umbilical cord in the vagina with a fetal hand presenting.  GC, Chlamydia and group B strep cultures were collected.  A digital exam was deferred at this time.  EXTREMITIES:                  Within normal limits.  LABORATORY DATA:              A UA was negative.  ASSESSMENT:                   1. Intrauterine pregnancy at approximately                                  18-2/7ths weeks.                               2. Cord prolapse.                               3. Fibroids.  PLAN:                         Admit to [facility] and Dr. Dierdre Forth will come and discuss further plans in detail. DD:  06/21/00 TD:  06/21/00 Job: [id number]

## 2011-01-31 NOTE — Op Note (Signed)
   [name], [name]                        ACCOUNT NO.:  [id number]   MEDICAL RECORD NO.:  [id number]                   PATIENT TYPE:  AMB   LOCATION:  SDC                                  [facility]ILITY:  [facility]   PHYSICIAN:  Hal Morales, M.D.             DATE OF BIRTH:  [birth year]   DATE OF PROCEDURE:  10/18/2002  DATE OF DISCHARGE:                                 OPERATIVE REPORT   PREOPERATIVE DIAGNOSES:  1. Intrauterine pregnancy at [redacted] weeks gestation.  2. Incompetent cervix.  3. History of fibroids.  4. Status post myomectomy.   POSTOPERATIVE DIAGNOSES:  1. Intrauterine pregnancy at [redacted] weeks gestation.  2. Incompetent cervix.  3. History of fibroids.  4. Status post myomectomy.   OPERATION:  Cervical cerclage.   ANESTHESIA:  Epidural.   ESTIMATED BLOOD LOSS:  Less than 50 mL.   COMPLICATIONS:  None.   FINDINGS:  The internal os of the cervix was dilated approximately a  fingertip.   PROCEDURE:  The patient was taken to the operating room after appropriate  identification and placed on the operating table.  After the placement of a  spinal anesthetic she was placed in the lithotomy position.  The perineum  and vagina were prepped with multiple layers of Betadine.  A red Robinson  catheter was used to empty the bladder.  A weighted speculum was placed in  the posterior vagina and Deschamps clamps placed on the anterior and  posterior cervix.  A 5 mm Mersilene suture was used to place a purse-string  suture around the cervix at the level of the internal os and was tied down  posteriorly.  At the time of completion of the cerclage the cervix was  slightly less than a fingertip.  Hemostasis was noted to be adequate.  All  instruments were removed from the vagina and the patient taken from the  operating room to the recovery room in satisfactory condition having  tolerated the procedure well with sponge and instrument counts correct.                    Hal Morales, M.D.    VPH/MEDQ  D:  10/17/2002  T:  10/17/2002  Job:  [id number]

## 2011-01-31 NOTE — Op Note (Signed)
[name], [name]                        ACCOUNT NO.:  [id number]   MEDICAL RECORD NO.:  [id number]                   PATIENT TYPE:  INP   LOCATION:  9103                                 [facility]ACILITY:  [facility]   PHYSICIAN:  Hal Morales, M.D.             DATE OF BIRTH:  20-May-1966   DATE OF PROCEDURE:  03/17/2003  DATE OF DISCHARGE:                                 OPERATIVE REPORT   PREOPERATIVE DIAGNOSIS:  Intrauterine pregnancy at [redacted] weeks gestation status  post myomectomy, history of incompetent cervix status post cerclage during  this pregnancy which was removed three weeks ago, nonreassuring fetal heart  rate tracing.   POSTOPERATIVE DIAGNOSIS:  Intrauterine pregnancy at [redacted] weeks gestation  status post myomectomy, history of incompetent cervix status post cerclage  during this pregnancy which was removed three weeks ago, nonreassuring fetal  heart rate tracing, occult cord prolapse.   OPERATION:  Primary low transverse cesarean section.   SURGEON:  Hal Morales, M.D.   FIRST ASSISTANT:  Wynelle Bourgeois, certified nurse mid-wife.   ANESTHESIA:  Epidural.   ESTIMATED BLOOD LOSS:  750 mL.   COMPLICATIONS:  None.   FINDINGS:  The patient was delivered of a female infant whose name is  Therapist, nutritional.  The weight is 8 pounds 3 ounces, Apgars of 8 and 9 at 1 and 5  minutes respectively.  The uterus, tubes, and ovaries were normal for the  gravid state.   PROCEDURE:  The patient was taken to the operating room and after  appropriate identification and after a discussion of the indication and the  risks of anesthesia, bleeding, infection, damage to adjacent organs, she had  her labor epidural and Foley catheter in place.  She was placed on the  operating table in the supine position with a left lateral tilt and her  epidural was dosed for surgical anesthesia.  The abdomen was prepped with  multiple layers of Betadine and draped in a sterile field.  After assurance  of  adequate anesthesia, 10 mL of 0.25% Marcaine were injected at the site of  the myomectomy incision.  A transverse incision was made at the site of the  previous incision and the abdomen opened in layers.  The peritoneum was  entered and the bladder blade placed.  The uterus was incised approximately  2 cm above the uterovesical fold and that incision taken laterally on either  side bluntly.  The infant was delivered from the occiput transverse position  with the aid of a vacuum extractor.  The nares and pharynx were suctioned,  the cord clamped and cut.  The infant was handed off to the awaiting  pediatricians.  The appropriate cord blood was drawn in the placenta,  separated from the uterus, and was removed from the operative field.  There  was the suggestion of a velamentous insertion of the cord.  All products of  conception were removed from  the uterine cavity and the uterine incision was  closed with running interlocking suture of 0 Vicryl.  An imbricating suture  of 0 Vicryl was placed with good hemostasis.  Copious irrigation was carried  out.  The abdominal peritoneum was closed with running suture of 2-0 Vicryl.  The rectus muscles were reapproximated in the midline with a figure-of-eight  suture of 2-0 Vicryl.  The rectus muscles were irrigated and made hemostatic  with Bovie cautery.  The rectus fascia was closed with running suture of 0  Vicryl and then reinforced on either side of the midline with a figure-of-  eight suture of 0 Vicryl.  The subcutaneous tissues were irrigated and made  hemostatic with Bovie cautery.  A subcuticular suture of 3-0 Monocryl was  used to close the skin incision.  A sterile dressing was applied.  The  patient was taken from the operating room to the recovery room in  satisfactory condition, having tolerated the procedure well with sponge and  instrument counts correct.  The infant went to the full term nursery.  The  placenta went to  pathology.                                               Hal Morales, M.D.    VPH/MEDQ  D:  03/17/2003  T:  03/17/2003  Job:  [id number]

## 2011-01-31 NOTE — Discharge Summary (Signed)
[name], [name]              ACCOUNT NO.:  [id number]   MEDICAL RECORD NO.:  [id number]          PATIENT TYPE:  INP   LOCATION:  9122                          FACILITY:  [facility]   PHYSICIAN:  Hal Morales, M.D.DATE OF BIRTH:  23-Aug-1966   DATE OF ADMISSION:  08/01/2005  DATE OF DISCHARGE:  08/04/2005                                 DISCHARGE SUMMARY   ADMISSION DIAGNOSES:  1.  Intrauterine pregnancy at term.  2.  Incompetent cervix with cerclage.  3.  History of increased Down syndrome risk on maternal screening.   DISCHARGE DIAGNOSES:  1.  Intrauterine pregnancy at term.  2.  Incompetent cervix with cerclage.  3.  History of increased Down syndrome risk on maternal screening.  4.  Status post Cesarean delivery of a female infant named Chelsea weighing      6 pounds, 11 ounces, Apgars 9 and 10.   HOSPITAL PROCEDURES:  1.  Spinal anesthesia.  2.  Cerclage removal.  3.  Repeat low transverse caesarian section.   HOSPITAL COURSE:  The patient was admitted for an elective repeat Cesarean  section and cerclage removal.  She underwent this procedure under spinal  anesthesia and was taken to recovery and then to the mother-baby unit, where  she did well.  Postop day number 1, Foley was removed, she was tolerating  regular diet, she was breastfeeding, and routine postop care was given.  Postop day number 2, she was improving, got up to the shower, tolerated a  regular diet, and care was continued.  On postop day number 3, she was  breastfeeding well, vital signs were stable, blood pressure 114/73, weight  was 219, which was an increase of 2 pounds, but denies headache or any other  symptoms.  Chest was clear.  Heart regular rate and rhythm.  Abdomen was  soft and appropriately tender.  Incision was clean, dry and intact with  Steri Strips.  Lochia was normal.  Extremities showed trace edema in feet.  She was deemed to have received the full benefit of her hospital stay and  was  discharged home.   DISCHARGE MEDICATIONS:  1.  Motrin 600 mg p.o. q.6 hours p.r.n.  2.  Tylox 1 to 2 p.o. q.4 hours p.r.n.   DISCHARGE LABS:  White blood cell count 10, hemoglobin 11.2, platelet count  223, RPR nonreactive.   DISCHARGE INSTRUCTIONS:  Per [facility] handout.   DISCHARGE FOLLOWUP:  In 6 weeks or p.r.n.      Marie L. Williams, C.N.M.      Hal Morales, M.D.  Electronically Signed    MLW/MEDQ  D:  08/04/2005  T:  08/04/2005  Job:  [id number]

## 2011-06-30 LAB — CBC
MCHC: 33.8
RBC: 4.28
WBC: 8.7

## 2011-12-10 ENCOUNTER — Other Ambulatory Visit: Payer: Self-pay | Admitting: Obstetrics and Gynecology

## 2011-12-10 ENCOUNTER — Ambulatory Visit (INDEPENDENT_AMBULATORY_CARE_PROVIDER_SITE_OTHER): Payer: BC Managed Care – PPO | Admitting: Obstetrics and Gynecology

## 2011-12-10 DIAGNOSIS — Z1231 Encounter for screening mammogram for malignant neoplasm of breast: Secondary | ICD-10-CM

## 2011-12-10 DIAGNOSIS — Z01419 Encounter for gynecological examination (general) (routine) without abnormal findings: Secondary | ICD-10-CM

## 2011-12-24 ENCOUNTER — Telehealth: Payer: Self-pay | Admitting: Obstetrics and Gynecology

## 2011-12-24 NOTE — Telephone Encounter (Signed)
PC TO PT PER TELEPHONE CALL. PT STATES,"SPOUSE WILL PROCEED WITH HAVING VASECTOMY". PT WILL CALL BACK IF DESIRES BTL @ LATER TIME. PT ALSO NOTICED A QUARTER-SIZED BLOOD CLOT X 1 EPISODE TODAY. LMP 12-20-11. NO FEVER. NO ABN ABD PAIN. ADVISED PT TO MONITOR AND IF EPISODE REOCCURS, PT TO CALL BACK. PT AGREES

## 2012-01-03 ENCOUNTER — Encounter (HOSPITAL_COMMUNITY): Payer: Self-pay | Admitting: Emergency Medicine

## 2012-01-03 ENCOUNTER — Emergency Department (HOSPITAL_COMMUNITY)
Admission: EM | Admit: 2012-01-03 | Discharge: 2012-01-03 | Disposition: A | Discharge status: Home / Self Care | Payer: BC Managed Care – PPO | Attending: Emergency Medicine | Admitting: Emergency Medicine

## 2012-01-03 DIAGNOSIS — H579 Unspecified disorder of eye and adnexa: Secondary | ICD-10-CM | POA: Insufficient documentation

## 2012-01-03 DIAGNOSIS — R220 Localized swelling, mass and lump, head: Secondary | ICD-10-CM | POA: Insufficient documentation

## 2012-01-03 DIAGNOSIS — G35 Multiple sclerosis: Secondary | ICD-10-CM | POA: Insufficient documentation

## 2012-01-03 DIAGNOSIS — J3489 Other specified disorders of nose and nasal sinuses: Secondary | ICD-10-CM | POA: Insufficient documentation

## 2012-01-03 DIAGNOSIS — T7840XA Allergy, unspecified, initial encounter: Secondary | ICD-10-CM

## 2012-01-03 DIAGNOSIS — R221 Localized swelling, mass and lump, neck: Secondary | ICD-10-CM | POA: Insufficient documentation

## 2012-01-03 HISTORY — DX: Multiple sclerosis: G35

## 2012-01-03 MED ORDER — DIPHENHYDRAMINE HCL 25 MG PO CAPS
50.0000 mg | ORAL_CAPSULE | Freq: Once | ORAL | Status: AC
  Administered 2012-01-03: 50 mg via ORAL
  Filled 2012-01-03: qty 2

## 2012-01-03 MED ORDER — ONDANSETRON HCL 4 MG/2ML IJ SOLN
4.0000 mg | Freq: Once | INTRAMUSCULAR | Status: AC
  Administered 2012-01-03: 4 mg via INTRAVENOUS
  Filled 2012-01-03: qty 2

## 2012-01-03 MED ORDER — PREDNISONE 10 MG PO TABS: 40.0000 mg | ORAL_TABLET | Freq: Every day | ORAL | Status: DC

## 2012-01-03 MED ORDER — METHYLPREDNISOLONE SODIUM SUCC 125 MG IJ SOLR
INTRAMUSCULAR | Status: AC
  Administered 2012-01-03: 09:00:00
  Filled 2012-01-03: qty 2

## 2012-01-03 MED ORDER — EPINEPHRINE HCL 1 MG/ML IJ SOLN
INTRAMUSCULAR | Status: AC
  Administered 2012-01-03: 09:00:00
  Filled 2012-01-03: qty 1

## 2012-01-03 MED ORDER — FAMOTIDINE 20 MG PO TABS: 20.0000 mg | ORAL_TABLET | Freq: Two times a day (BID) | ORAL | Status: DC

## 2012-01-03 MED ORDER — FAMOTIDINE IN NACL 20-0.9 MG/50ML-% IV SOLN
20.0000 mg | Freq: Once | INTRAVENOUS | Status: AC
  Administered 2012-01-03: 20 mg via INTRAVENOUS
  Filled 2012-01-03: qty 50

## 2012-01-03 MED ORDER — DIPHENHYDRAMINE HCL 25 MG PO TABS: 50.0000 mg | ORAL_TABLET | Freq: Four times a day (QID) | ORAL | Status: DC

## 2012-01-03 MED ORDER — EPINEPHRINE 0.3 MG/0.3ML IJ DEVI: 0.3000 mg | INTRAMUSCULAR | Status: DC | PRN

## 2012-01-03 NOTE — ED Notes (Signed)
ZOX:WR60<AV> Expected date:01/03/12<BR> Expected time: 9:45 AM<BR> Means of arrival:Ambulance<BR> Comments:<BR> Allergic Reaction

## 2012-01-03 NOTE — ED Notes (Signed)
Per EMS: Pt tried soy milk for the first time today with her cereal.  About 15 minutes later, her eyes began to swell, throat started swelling, and she began to itch.  20 g in L hand.  125 solumedrol.  0.3 epi IM.  25 mg benadryl given en route.  Pt took 25 mg of benadryl on her own while at home.  Denies SOB.  C/o nasal congestion and copious congestion.

## 2012-01-03 NOTE — Discharge Instructions (Signed)
Please follow-up with your doctor and an allergist of her choice as we discussed. Return to the ER immediately if your symptoms get worse.     Anaphylactic Reaction An anaphylactic reaction is a severe allergic reaction. It may be caused by medicines, food, insect bites, or other common items. It cannot spread from one person to another (contagious). It can be life threatening and require hospitalization.  SYMPTOMS  Symptoms may include, but are not limited to, the following:  Skin rash or hives.   Itching.   Chest tightness.   Swelling (including the eyes, tongue, or lips).   Trouble breathing or swallowing.   Lightheadedness or fainting.   Stomach pains or vomiting.  Symptoms may gradually disappear when you are no longer around the substance that caused the problem. You may find that 2 or 3 days are needed for symptoms to go away completely. HOME CARE INSTRUCTIONS   Carry a card or wear a bracelet that lists anything that has caused past anaphylaxis or a less severe allergic reaction.   If 1 or more medicines have caused past problems, you need to avoid the same or similar medicines in the future.   Talk with a medical caregiver before using any new prescription or over-the-counter medicines.   If you develop hives or a rash:   Apply cold compresses to the skin, or take a cool bath to help reduce itching.   Avoid hot baths or showers. This might make itching or the rash worse.   Wear loose-fitting clothes.   If you have had a severe allergic reaction in the past:   Carry an anaphylaxis kit with you at all times. Both you and family members should be shown how to give the medicines in the kit. This can be lifesaving if there is another severe reaction. If it needs to be used, and symptoms improve, it is still important for you to seek immediate medical care or call your local emergency services (911 in the [place] This is because severe symptoms can return when the medicine  in the kit wears off.   If hospitalization is not required, you should have fast access to emergency services if the problem recurs. If a family member or friend has been shown how to give the medicines in an anaphylaxis kit and can stay with you, he or she can also help give the medicines from the kit if problems return.   Make sure you renew your anaphylaxis kit when it gets close to being out of date.   You may return to your normal activities when the allergic symptoms are gone.  SEEK MEDICAL CARE IF:   You develop symptoms of an allergic reaction to a new substance. Symptoms may occur right away or minutes later.   Rash, hives, or itching occurs.   You have different symptoms than you have had previously.  SEEK IMMEDIATE MEDICAL CARE IF:   You have difficulty breathing, start to wheeze, or a tight feeling in the chest or throat develops.   You develop swelling of the mouth or tongue or swelling or itching over most of your body.   You develop severe stomach pains or repeated vomiting.   You feel very lightheaded or pass out.   Chest pain develops or there is a worsening of problems noted above. THIS IS AN EMERGENCY. Call your local emergency services (911 in the [place]  MAKE SURE YOU:   Understand these instructions.   Will watch your condition.   Will  get help right away if you have recurrent problems or have problems that are getting worse.  Document Released: 09/01/2005 Document Revised: 08/21/2011 Document Reviewed: 04/19/2008 [facility] Patient Information 2012 [place], [place].          Epinephrine Injection Epinephrine is a medicine given by injection to temporarily treat an emergency allergic reaction. It is also used to treat severe asthmatic attacks and other lung problems. The medicine helps to enlarge (dilate) the small breathing tubes of the lungs. A life-threatening, sudden allergic reaction that involves the whole body is called anaphylaxis. Because of  potential side effects, epinephrine should only be used as directed by your caregiver. RISKS AND COMPLICATIONS Possible side effects of epinephrine injections include:  Chest pain.   Irregular or rapid heartbeat.   Shortness of breath.   Nausea.   Vomiting.   Abdominal pain or cramping.   Sweating.   Dizziness.   Weakness.   Headache.   Nervousness.  Report all side effects to your caregiver. HOW TO GIVE AN EPINEPHRINE INJECTION Give the epinephrine injection immediately when symptoms of a severe reaction begin. Inject the medicine into the outer thigh or any available, large muscle. Your caregiver can teach you how to do this. You do not need to remove any clothing. After the injection, call your local emergency services (911 in [place] Even if you improve after the injection, you need to be examined at a hospital emergency department. Epinephrine works quickly, but it also wears off quickly. Delayed reactions can occur. A delayed reaction may be as serious and dangerous as the initial reaction. HOME CARE INSTRUCTIONS  Make sure you and your family know how to give an epinephrine injection.   Use epinephrine injections as directed by your caregiver. Do not use this medicine more often or in larger doses than prescribed.   Always carry your epinephrine injection or anaphylaxis kit with you. This can be lifesaving if you have a severe reaction.   Store the medicine in a cool, dry place. If the medicine becomes discolored or cloudy, dispose of it properly and replace it with new medicine.   Check the expiration date on your medicine. It may be unsafe to use medicines past their expiration date.   Tell your caregiver about any other medicines you are taking. Some medicines can react badly with epinephrine.   Tell your caregiver about any medical conditions you have, such as diabetes, high blood pressure (hypertension), heart disease, irregular heartbeats, or if you are pregnant.   SEEK IMMEDIATE MEDICAL CARE IF:  You have used an epinephrine injection. Call your local emergency services (911 in [place] Even if you improve after the injection, you need to be examined at a hospital emergency department to make sure your allergic reaction is under control. You will also be monitored for adverse effects from the medicine.   You have chest pain.   You have irregular or fast heartbeats.   You have shortness of breath.   You have severe headaches.   You have severe nausea, vomiting, or abdominal cramps.   You have severe pain, swelling, or redness in the area where you gave the injection.  Document Released: 08/29/2000 Document Revised: 08/21/2011 Document Reviewed: 05/21/2011 [facility] Patient Information 2012 [place], [place].

## 2012-01-03 NOTE — ED Provider Notes (Signed)
History     CSN: [id number]  Arrival date & time 01/03/12  4098   First MD Initiated Contact with Patient 01/03/12 [id number]      Chief Complaint  Patient presents with  . Allergic Reaction    (Consider location/radiation/quality/duration/timing/severity/associated sxs/prior treatment) The history is provided by the patient.  46 y/o F with PMH of MS with no known food allergies presents to ED via EMS with c/c of allergic reaction, suspected to be from using soy milk in her cereal this morning. Has never had soy milk before; has eaten the cereal before. While driving to work approx 46-82 minutes after eating, she developed eye itching, eyelid swelling, nasal congestion, and throat swelling. Denies that there was any dizziness, SOB, palpitations, CP, abd pain, N/V, or rash. She does not take any medications daily and has had no new exposures otherwise. Nothing makes her symptoms worse. She took benadryl 25mg  prior to EMS arrival. She was given an additional 25mg  benadryl as well as 125 solumedrol and 0.3 epi by EMS PTA in the ED. She reports significantly improved eye itching and throat swelling, but is afraid to swallow. Has never had a reaction like this in the past.  Past Medical History  Diagnosis Date  . Multiple sclerosis     No past surgical history on file.  No family history on file.  History  Substance Use Topics  . Smoking status: Never Smoker   . Smokeless tobacco: Never Used  . Alcohol Use: No    Review of Systems 10 systems reviewed and are negative for acute change except as noted in the HPI.  Allergies  Review of patient's allergies indicates not on file.  Home Medications  No current outpatient prescriptions on file.  BP 135/72  Pulse 104  Temp(Src) 98.1 F (36.7 C) (Oral)  Resp 18  SpO2 96%  LMP 12/20/2011  Physical Exam  Nursing note reviewed. Constitutional: She is oriented to person, place, and time. She appears well-developed and well-nourished.      VS reviewed, sig for tachycardia. Pt sitting up on stretcher, spitting oral secretions into emesis bag (though denies any throat swelling)  HENT:  Head: Normocephalic and atraumatic.  Right Ear: Hearing and external ear normal.  Left Ear: Hearing and external ear normal.  Nose: Mucosal edema present.  Mouth/Throat: Uvula is midline, oropharynx is clear and moist and mucous membranes are normal.       Edematous uvula  Eyes:       Unable to assess pupils, EOM, or conjunctiva secondary to significant eyelid edema.   Neck: Normal range of motion. Neck supple.  Cardiovascular:       Tachycardia, regular rhythm, normal heart sounds without murmur  Pulmonary/Chest: Effort normal and breath sounds normal. No stridor. No respiratory distress. She has no wheezes. She has no rales. She exhibits no tenderness.  Abdominal: Soft. Bowel sounds are normal. She exhibits no distension. There is no tenderness.  Musculoskeletal: She exhibits no edema and no tenderness.  Neurological: She is alert and oriented to person, place, and time. GCS eye subscore is 4. GCS verbal subscore is 5. GCS motor subscore is 6.       Speech normal.  Skin: Skin is warm and dry.       Mild erythema to upper chest without urticarial appearance. No other areas of the body affected.  Psychiatric:       Anxious    ED Course  Procedures (including critical care time)  Labs Reviewed -  No data to display No results found.   Dx 1: Allergic reaction   MDM  10:15 AM Pt has been seen and evaluated. Initial H&P completed. Allergic rxn improving after med administration. Pepcid ordered as she has not received any. Pt nauseated after stimulation of gag reflex while examining uvula, zofran ordered. Will continue to monitor closely.      1:00 PM Pt with improving eyelid edema. Chemosis now visible bilaterally. PERRL. No longer spitting into emesis bag. Tolerating oral secretions well. No SOB. Family at bedside.      5:15  PM Pt continues to improve. Uvula swelling significantly decreased. Tolerating oral secretions and other PO intake. Lungs remain clear with no increased WOB. No rash. Will d.c home. Return precautions were discussed. Spoke at length with pt and family regarding the use of an epi-pen and follow-up with an allergist as preferred by her primary doctor. Pt agrees to consult PMD early this week to arrange follow-up.  Shaaron Adler, [place] 01/03/12 1720

## 2012-01-04 NOTE — ED Provider Notes (Signed)
Medical screening examination/treatment/procedure(s) were conducted as a shared visit with non-physician practitioner(s) and myself.  I personally evaluated the patient during the encounter   Breshae Belcher, MD 01/04/12 0710 

## 2012-01-14 ENCOUNTER — Ambulatory Visit (HOSPITAL_COMMUNITY)
Admission: RE | Admit: 2012-01-14 | Discharge: 2012-01-14 | Disposition: A | Discharge status: Home / Self Care | Payer: BC Managed Care – PPO | Source: Ambulatory Visit | Attending: Obstetrics and Gynecology | Admitting: Obstetrics and Gynecology

## 2012-01-14 DIAGNOSIS — Z1231 Encounter for screening mammogram for malignant neoplasm of breast: Secondary | ICD-10-CM | POA: Insufficient documentation

## 2012-04-07 ENCOUNTER — Encounter: Payer: Self-pay | Admitting: Obstetrics and Gynecology

## 2013-01-06 DIAGNOSIS — E559 Vitamin D deficiency, unspecified: Secondary | ICD-10-CM | POA: Insufficient documentation

## 2013-01-06 DIAGNOSIS — G35 Multiple sclerosis: Secondary | ICD-10-CM | POA: Insufficient documentation

## 2013-01-06 DIAGNOSIS — G35D Multiple sclerosis, unspecified: Secondary | ICD-10-CM | POA: Insufficient documentation

## 2013-01-14 ENCOUNTER — Ambulatory Visit: Payer: BC Managed Care – PPO | Attending: Physician Assistant | Admitting: Physical Therapy

## 2013-01-14 DIAGNOSIS — R269 Unspecified abnormalities of gait and mobility: Secondary | ICD-10-CM | POA: Insufficient documentation

## 2013-01-14 DIAGNOSIS — M6281 Muscle weakness (generalized): Secondary | ICD-10-CM | POA: Insufficient documentation

## 2013-01-14 DIAGNOSIS — IMO0001 Reserved for inherently not codable concepts without codable children: Secondary | ICD-10-CM | POA: Insufficient documentation

## 2013-01-19 ENCOUNTER — Ambulatory Visit: Payer: BC Managed Care – PPO | Admitting: Physical Therapy

## 2013-01-19 ENCOUNTER — Ambulatory Visit: Payer: BC Managed Care – PPO | Admitting: *Deleted

## 2013-01-21 ENCOUNTER — Ambulatory Visit: Payer: BC Managed Care – PPO | Admitting: Physical Therapy

## 2013-01-26 ENCOUNTER — Ambulatory Visit: Payer: BC Managed Care – PPO | Admitting: Physical Therapy

## 2013-02-02 ENCOUNTER — Ambulatory Visit: Payer: BC Managed Care – PPO | Admitting: Physical Therapy

## 2013-02-09 ENCOUNTER — Ambulatory Visit: Payer: BC Managed Care – PPO | Admitting: Physical Therapy

## 2013-02-16 ENCOUNTER — Ambulatory Visit: Payer: BC Managed Care – PPO | Attending: Physician Assistant | Admitting: Physical Therapy

## 2013-02-16 DIAGNOSIS — M6281 Muscle weakness (generalized): Secondary | ICD-10-CM | POA: Insufficient documentation

## 2013-02-16 DIAGNOSIS — IMO0001 Reserved for inherently not codable concepts without codable children: Secondary | ICD-10-CM | POA: Insufficient documentation

## 2013-02-16 DIAGNOSIS — R269 Unspecified abnormalities of gait and mobility: Secondary | ICD-10-CM | POA: Insufficient documentation

## 2013-02-23 ENCOUNTER — Ambulatory Visit: Payer: BC Managed Care – PPO | Admitting: Physical Therapy

## 2013-03-02 ENCOUNTER — Ambulatory Visit: Payer: BC Managed Care – PPO | Admitting: Physical Therapy

## 2013-03-09 ENCOUNTER — Ambulatory Visit: Payer: BC Managed Care – PPO | Admitting: Physical Therapy

## 2013-03-25 ENCOUNTER — Ambulatory Visit: Payer: BC Managed Care – PPO | Attending: Physician Assistant | Admitting: Physical Therapy

## 2013-03-25 DIAGNOSIS — IMO0001 Reserved for inherently not codable concepts without codable children: Secondary | ICD-10-CM | POA: Insufficient documentation

## 2013-03-25 DIAGNOSIS — M6281 Muscle weakness (generalized): Secondary | ICD-10-CM | POA: Insufficient documentation

## 2013-03-25 DIAGNOSIS — R269 Unspecified abnormalities of gait and mobility: Secondary | ICD-10-CM | POA: Insufficient documentation

## 2013-04-27 ENCOUNTER — Other Ambulatory Visit: Payer: Self-pay | Admitting: Obstetrics and Gynecology

## 2013-04-27 DIAGNOSIS — Z1231 Encounter for screening mammogram for malignant neoplasm of breast: Secondary | ICD-10-CM

## 2013-06-22 ENCOUNTER — Ambulatory Visit (HOSPITAL_COMMUNITY)
Admission: RE | Admit: 2013-06-22 | Discharge: 2013-06-22 | Disposition: A | Discharge status: Home / Self Care | Payer: BC Managed Care – PPO | Source: Ambulatory Visit | Attending: Obstetrics and Gynecology | Admitting: Obstetrics and Gynecology

## 2013-06-22 DIAGNOSIS — Z1231 Encounter for screening mammogram for malignant neoplasm of breast: Secondary | ICD-10-CM | POA: Insufficient documentation

## 2014-03-26 ENCOUNTER — Encounter (HOSPITAL_COMMUNITY): Payer: Self-pay | Admitting: Emergency Medicine

## 2014-03-26 ENCOUNTER — Emergency Department (HOSPITAL_COMMUNITY)
Admission: EM | Admit: 2014-03-26 | Discharge: 2014-03-27 | Disposition: A | Discharge status: Home / Self Care | Payer: BC Managed Care – HMO | Attending: Emergency Medicine | Admitting: Emergency Medicine

## 2014-03-26 DIAGNOSIS — Z79899 Other long term (current) drug therapy: Secondary | ICD-10-CM | POA: Diagnosis not present

## 2014-03-26 DIAGNOSIS — IMO0002 Reserved for concepts with insufficient information to code with codable children: Secondary | ICD-10-CM | POA: Insufficient documentation

## 2014-03-26 DIAGNOSIS — Z8669 Personal history of other diseases of the nervous system and sense organs: Secondary | ICD-10-CM | POA: Insufficient documentation

## 2014-03-26 DIAGNOSIS — M5430 Sciatica, unspecified side: Secondary | ICD-10-CM | POA: Diagnosis present

## 2014-03-26 DIAGNOSIS — M5416 Radiculopathy, lumbar region: Secondary | ICD-10-CM

## 2014-03-26 MED ORDER — DIAZEPAM 5 MG PO TABS
5.0000 mg | ORAL_TABLET | Freq: Once | ORAL | Status: AC
  Administered 2014-03-26: 5 mg via ORAL
  Filled 2014-03-26: qty 1

## 2014-03-26 MED ORDER — DEXAMETHASONE SODIUM PHOSPHATE 10 MG/ML IJ SOLN
10.0000 mg | Freq: Once | INTRAMUSCULAR | Status: AC
  Administered 2014-03-26: 10 mg via INTRAVENOUS
  Filled 2014-03-26: qty 1

## 2014-03-26 MED ORDER — MORPHINE SULFATE 4 MG/ML IJ SOLN
4.0000 mg | INTRAMUSCULAR | Status: DC | PRN
  Administered 2014-03-26: 4 mg via INTRAVENOUS
  Filled 2014-03-26: qty 1

## 2014-03-26 NOTE — ED Notes (Signed)
IV team paged for IV start. 

## 2014-03-26 NOTE — ED Notes (Signed)
Pt c/o L lower back pain radiating down L leg x 1 week

## 2014-03-26 NOTE — ED Provider Notes (Signed)
CSN: [id number]     Arrival date & time 03/26/14  2007 History   First MD Initiated Contact with Patient 03/26/14 2218     Chief Complaint  Patient presents with  . Sciatica     (Consider location/radiation/quality/duration/timing/severity/associated sxs/prior Treatment) HPI  [name] is a 48 y.o. female with past medical history significant for multiple sclerosis complaining of low back pain radiating down the left leg worsening over the course of a week, not alleviated by ibuprofen. Pain is exacerbated by standing or laying flat. Patient does not have a history of chronic low back pain, atraumatic. Denies fever, chills, change in bowel or bladder habits, h/o IDVU or cancer, numbness or weakness.    Past Medical History  Diagnosis Date  . Multiple sclerosis    Past Surgical History  Procedure Laterality Date  . Cesarean section    . Wisdom tooth extraction    . Uterine fibroid surgery     No family history on file. History  Substance Use Topics  . Smoking status: Never Smoker   . Smokeless tobacco: Never Used  . Alcohol Use: No   OB History   Grav Para Term Preterm Abortions TAB SAB Ect Mult Living                 Review of Systems  10 systems reviewed and found to be negative, except as noted in the HPI.  Allergies  Food and Oxycodone hcl  Home Medications   Prior to Admission medications   Medication Sig Start Date End Date Taking? Authorizing Provider  cholecalciferol (VITAMIN D) 1000 UNITS tablet Take 1,000 Units by mouth daily.   Yes Historical Provider, MD  ibuprofen (ADVIL,MOTRIN) 200 MG tablet Take 200 mg by mouth every 6 (six) hours as needed (pain).   Yes Historical Provider, MD  diazepam (VALIUM) 5 MG tablet Take 1 tablet (5 mg total) by mouth 2 (two) times daily. 03/27/14   Destiny Trickey, PA-C  diphenhydrAMINE (BENADRYL) 25 MG tablet Take 2 tablets (50 mg total) by mouth every 6 (six) hours. 01/03/12 02/02/12  Orlie Dakin, PA-C   famotidine (PEPCID) 20 MG tablet Take 1 tablet (20 mg total) by mouth 2 (two) times daily. 01/03/12 01/02/13  Orlie Dakin, PA-C  traMADol (ULTRAM) 50 MG tablet Take 1 tablet (50 mg total) by mouth every 6 (six) hours as needed. 03/27/14   Julene Rahn, PA-C   BP 131/75  Pulse 95  Temp(Src) 98.2 F (36.8 C) (Oral)  Resp 20  Ht 5\' 7"  (1.702 m)  Wt 220 lb (99.791 kg)  BMI 34.45 kg/m2  SpO2 100% Physical Exam  Nursing note and vitals reviewed. Constitutional: She is oriented to person, place, and time. She appears well-developed and well-nourished. No distress.  HENT:  Head: Normocephalic and atraumatic.  Mouth/Throat: Oropharynx is clear and moist.  Eyes: Conjunctivae and EOM are normal. Pupils are equal, round, and reactive to light.  Cardiovascular: Normal rate, regular rhythm and intact distal pulses.   Pulmonary/Chest: Effort normal and breath sounds normal. No stridor. No respiratory distress. She has no wheezes. She has no rales. She exhibits no tenderness.  Abdominal: Soft. Bowel sounds are normal. She exhibits no distension and no mass. There is no tenderness. There is no rebound and no guarding.  Musculoskeletal: Normal range of motion.  Neurological: She is alert and oriented to person, place, and time.  Skin: Skin is warm.  Psychiatric: She has a normal mood and affect.    ED  Course  Procedures (including critical care time) Labs Review Labs Reviewed - No data to display  Imaging Review No results found.   EKG Interpretation None      MDM   Final diagnoses:  Lumbar radiculopathy, acute   Filed Vitals:   03/26/14 2105 03/26/14 2114  BP: 131/75   Pulse: 95   Temp: 98.2 F (36.8 C)   TempSrc: Oral   Resp: 20   Height:  5\' 7"  (1.702 m)  Weight:  220 lb (99.791 kg)  SpO2: 100%     Medications  morphine 4 MG/ML injection 4 mg (4 mg Intravenous Given 03/26/14 2344)  traMADol (ULTRAM) tablet 50 mg (not administered)  dexamethasone (DECADRON)  injection 10 mg (10 mg Intravenous Given 03/26/14 2344)  diazepam (VALIUM) tablet 5 mg (5 mg Oral Given 03/26/14 2306)  ketorolac (TORADOL) 30 MG/ML injection 15 mg (15 mg Intravenous Given 03/27/14 0033)    [name] is a 48 y.o. female presenting with  back pain.  No neurological deficits and normal neuro exam.  Patient can walk but states is painful.  No loss of bowel or bladder control.  No concern for cauda equina.  No fever, night sweats, weight loss, h/o cancer, IVDU.  RICE protocol and pain medicine indicated and discussed with patient.  Evaluation does not show pathology that would require ongoing emergent intervention or inpatient treatment. Pt is hemodynamically stable and mentating appropriately. Discussed findings and plan with patient/guardian, who agrees with care plan. All questions answered. Return precautions discussed and outpatient follow up given.   New Prescriptions   DIAZEPAM (VALIUM) 5 MG TABLET    Take 1 tablet (5 mg total) by mouth 2 (two) times daily.   TRAMADOL (ULTRAM) 50 MG TABLET    Take 1 tablet (50 mg total) by mouth every 6 (six) hours as needed.        Monico Blitz, PA-C 03/27/14 [phone]

## 2014-03-27 MED ORDER — TRAMADOL HCL 50 MG PO TABS
50.0000 mg | ORAL_TABLET | Freq: Once | ORAL | Status: AC
  Administered 2014-03-27: 50 mg via ORAL
  Filled 2014-03-27: qty 1

## 2014-03-27 MED ORDER — TRAMADOL HCL 50 MG PO TABS: 50.0000 mg | ORAL_TABLET | Freq: Four times a day (QID) | ORAL | Status: DC | PRN

## 2014-03-27 MED ORDER — KETOROLAC TROMETHAMINE 30 MG/ML IJ SOLN
15.0000 mg | Freq: Once | INTRAMUSCULAR | Status: AC
  Administered 2014-03-27: 15 mg via INTRAVENOUS
  Filled 2014-03-27: qty 1

## 2014-03-27 MED ORDER — DIAZEPAM 5 MG PO TABS: 5.0000 mg | ORAL_TABLET | Freq: Two times a day (BID) | ORAL | Status: DC

## 2014-03-27 NOTE — Discharge Instructions (Signed)
Please take ibuprofen 400mg  (this is normally 2 over the counter pills) every 6 hours (take with food to minimze stomach irritation).   Take valium and/or tramadol for breakthrough pain, do not drink alcohol, drive, care for children or perfom other critical tasks while taking valium and/or tramadol.  Please follow with your primary care doctor in the next 2 days for a check-up. They must obtain records for further management.   Do not hesitate to return to the Emergency Department for any new, worsening or concerning symptoms.    Lumbosacral Radiculopathy Lumbosacral radiculopathy is a pinched nerve or nerves in the low back (lumbosacral area). When this happens you may have weakness in your legs and may not be able to stand on your toes. You may have pain going down into your legs. There may be difficulties with walking normally. There are many causes of this problem. Sometimes this may happen from an injury, or simply from arthritis or boney problems. It may also be caused by other illnesses such as diabetes. If there is no improvement after treatment, further studies may be done to find the exact cause. DIAGNOSIS  X-rays may be needed if the problems become long standing. Electromyograms may be done. This study is one in which the working of nerves and muscles is studied. HOME CARE INSTRUCTIONS   Applications of ice packs may be helpful. Ice can be used in a plastic bag with a towel around it to prevent frostbite to skin. This may be used every 2 hours for 20 to 30 minutes, or as needed, while awake, or as directed by your caregiver.  Only take over-the-counter or prescription medicines for pain, discomfort, or fever as directed by your caregiver.  If physical therapy was prescribed, follow your caregiver's directions. SEEK IMMEDIATE MEDICAL CARE IF:   You have pain not controlled with medications.  You seem to be getting worse rather than better.  You develop increasing weakness in  your legs.  You develop loss of bowel or bladder control.  You have difficulty with walking or balance, or develop clumsiness in the use of your legs.  You have a fever. MAKE SURE YOU:   Understand these instructions.  Will watch your condition.  Will get help right away if you are not doing well or get worse. Document Released: 09/01/2005 Document Revised: 11/24/2011 Document Reviewed: 04/21/2008 [facility] Patient Information 2015 [place], [place]. This information is not intended to replace advice given to you by your health care provider. Make sure you discuss any questions you have with your health care provider.

## 2014-03-29 NOTE — ED Provider Notes (Signed)
Medical screening examination/treatment/procedure(s) were performed by non-physician practitioner and as supervising physician I was immediately available for consultation/collaboration.    Dorie Rank, MD 03/29/14 [phone]

## 2014-06-30 ENCOUNTER — Other Ambulatory Visit (HOSPITAL_COMMUNITY): Payer: Self-pay | Admitting: Obstetrics and Gynecology

## 2014-06-30 DIAGNOSIS — Z1231 Encounter for screening mammogram for malignant neoplasm of breast: Secondary | ICD-10-CM

## 2014-07-17 ENCOUNTER — Ambulatory Visit (HOSPITAL_COMMUNITY)
Admission: RE | Admit: 2014-07-17 | Discharge: 2014-07-17 | Disposition: A | Discharge status: Home / Self Care | Payer: BC Managed Care – HMO | Source: Ambulatory Visit | Attending: Obstetrics and Gynecology | Admitting: Obstetrics and Gynecology

## 2014-07-17 DIAGNOSIS — Z1231 Encounter for screening mammogram for malignant neoplasm of breast: Secondary | ICD-10-CM | POA: Insufficient documentation

## 2014-07-20 ENCOUNTER — Other Ambulatory Visit: Payer: Self-pay | Admitting: Obstetrics and Gynecology

## 2014-07-20 DIAGNOSIS — R928 Other abnormal and inconclusive findings on diagnostic imaging of breast: Secondary | ICD-10-CM

## 2014-08-22 ENCOUNTER — Ambulatory Visit
Admission: RE | Admit: 2014-08-22 | Discharge: 2014-08-22 | Disposition: A | Discharge status: Home / Self Care | Payer: BC Managed Care – PPO | Source: Ambulatory Visit | Attending: Obstetrics and Gynecology | Admitting: Obstetrics and Gynecology

## 2014-08-22 DIAGNOSIS — R928 Other abnormal and inconclusive findings on diagnostic imaging of breast: Secondary | ICD-10-CM

## 2014-09-28 ENCOUNTER — Ambulatory Visit (INDEPENDENT_AMBULATORY_CARE_PROVIDER_SITE_OTHER): Payer: BLUE CROSS/BLUE SHIELD | Admitting: Neurology

## 2014-09-28 ENCOUNTER — Encounter: Payer: Self-pay | Admitting: Neurology

## 2014-09-28 VITALS — BP 120/68 | HR 78 | Resp 16 | Ht 67.0 in | Wt 184.6 lb

## 2014-09-28 DIAGNOSIS — Z79899 Other long term (current) drug therapy: Secondary | ICD-10-CM

## 2014-09-28 DIAGNOSIS — G35 Multiple sclerosis: Secondary | ICD-10-CM

## 2014-09-28 NOTE — Progress Notes (Signed)
[facility]  PATIENT: [name] DOB: December 22, 1965  REFERRING CLINICIAN: Willey Blade HISTORY FROM: Patient and mother REASON FOR VISIT: Multiple sclerosis   HISTORICAL  CHIEF COMPLAINT:  Chief Complaint  Patient presents with  . Multiple Sclerosis    HISTORY OF PRESENT ILLNESS:  [name] is a 49 year old woman who was diagnosed around 44 when she presented with backache, poor gait and visual blurring. She had an MRI and a lumbar puncture. The studies were consistent with multiple sclerosis. She received several days of IV steroids. She was placed on a disease modifying therapy at that time. Her legs and her walking got completely better and she was back at baseline after the steroids. Over the next 15 years or so she had no additional symptoms. She had worsening symptoms in the left leg that was affecting her balance. She saw Dr. Krista Blue and had a repeat MRI. There was progression. However, she opted not to go on medication at that time.   She went to [facility] for several visits. At that time she was started on Tecfidera. She has been on Tecfidera for over a year she tolerates it well and has not had much flushing or GI symptoms. She has not had definite exacerbations. However, her gait has worsened and she has gone from a cane to a walker.  Her gait has been poor since 2012. The left leg has been worse than the right leg. She notes poor balance. More recently she is falling more. She started using a cane after she saw Dr. Krista Blue about 2 years ago. This summer she started using a walker. Both feet are numb. After physical therapy she felt the feet got better after infrared therapy. About 2 years ago she got a left AFO brace when her foot drop on the left worsened. It seemed to help a bit at first and she is less sure now.   She feels the weakness is only on the left. She denies any weakness in the arms. The legs, the left leg is much more clumsy than the  right and she denies any ataxia in the arms.   We had discussed Ampyra in the past. She chose not to go on it because of the risk of seizures.  She is interested in the [employer] foot drop system  She feels her bladder is doing well. She denies any vision changes.  She feels tired daily, especially as the day goes on. She denies any depression or anxiety. However, her mother thinks that she is moody at times. She sleeps well at night. She denies any cognitive dysfunction. She has no difficulty with short-term memory or getting words out.  She is unable to work. 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 She has tried to get disability because of her impairments but has not been able to do so yet.  In July 2015 she went to the emergency room after she had the onset of low back and left leg pain.A lumbar radiculopathy. She had regular x-rays but not an MRI at that time. She noted that her walking worsened with the pain and she went from a cane to walk her at that time. Even though her pain has improved she continues to use the walker.   REVIEW OF SYSTEMS:  Constitutional: No fevers, chills, sweats, or change in appetite Eyes: No visual changes, double vision, eye pain Ear, nose and throat: No hearing loss, ear pain,  nasal congestion, sore throat Cardiovascular: No chest pain, palpitations Respiratory:  No shortness of breath at rest or with exertion.   No wheezes GastrointestinaI: No nausea, vomiting, diarrhea, abdominal pain, fecal incontinence Genitourinary:  No dysuria, urinary retention or frequency.  No nocturia. Musculoskeletal:  No neck pain.   Currently back pain is mild but increases with movements Integumentary: No rash, pruritus, skin lesions Neurological: as above Psychiatric: No major depression at this time -- but is moody.  No anxiety Endocrine: No palpitations, diaphoresis, change in appetite or increased thirst Hematologic/Lymphatic:  No anemia,  purpura, petechiae. Allergic/Immunologic: No itchy/runny eyes, nasal congestion, recent allergic reactions, rashes  ALLERGIES: Allergies  Allergen Reactions  . Food Other (See Comments)    Eyes swelling and throat swelling  . Oxycodone Hcl Hives  . Soy Allergy     Other reaction(s): SWELLING    HOME MEDICATIONS: Outpatient Prescriptions Prior to Visit  Medication Sig Dispense Refill  . ibuprofen (ADVIL,MOTRIN) 200 MG tablet Take 200 mg by mouth every 6 (six) hours as needed (pain).    [name] diphenhydrAMINE (BENADRYL) 25 MG tablet Take 2 tablets (50 mg total) by mouth every 6 (six) hours. 60 tablet 0  . famotidine (PEPCID) 20 MG tablet Take 1 tablet (20 mg total) by mouth 2 (two) times daily. 20 tablet 0  . cholecalciferol (VITAMIN D) 1000 UNITS tablet Take 1,000 Units by mouth daily.    . diazepam (VALIUM) 5 MG tablet Take 1 tablet (5 mg total) by mouth 2 (two) times daily. 10 tablet 0  . traMADol (ULTRAM) 50 MG tablet Take 1 tablet (50 mg total) by mouth every 6 (six) hours as needed. 15 tablet 0   No facility-administered medications prior to visit.    PAST MEDICAL HISTORY: Past Medical History  Diagnosis Date  . Multiple sclerosis   . Movement disorder     PAST SURGICAL HISTORY: Past Surgical History  Procedure Laterality Date  . Cesarean section    . Wisdom tooth extraction    . Uterine fibroid surgery      FAMILY HISTORY: Family History  Problem Relation Age of Onset  . Hypertension Mother   . Hypertension Father   . Diabetes Father     SOCIAL HISTORY:  History   Social History  . Marital Status: Married    Spouse Name: N/A    Number of Children: N/A  . Years of Education: N/A   Occupational History  . Not on file.   Social History Main Topics  . Smoking status: Never Smoker   . Smokeless tobacco: Never Used  . Alcohol Use: No  . Drug Use: No  . Sexual Activity: Not on file   Other Topics Concern  . Not on file   Social History Narrative      PHYSICAL EXAM  Filed Vitals:   09/28/14 1015  BP: 120/68  Pulse: 78  Resp: 16  Height: 5\' 7"  (1.702 m)  Weight: 184 lb 9.6 oz (83.734 kg)    Body mass index is 28.91 kg/(m^2).   General: The patient is well-developed and well-nourished and in no acute distress.   She uses a walker  Eyes:  Funduscopic exam shows normal optic discs and retinal vessels.  Neck: The neck is supple, no carotid bruits are noted.  The neck is nontender.  Respiratory: The respiratory examination is clear.  Cardiovascular: The cardiovascular examination reveals a regular rate and rhythm, no murmurs, gallops or rubs are noted.  Skin: Extremities are without significant edema.  Neurologic Exam  Mental status: The patient is alert and oriented x 3 at the time of the examination. The patient has apparent normal recent and remote memory, with an apparently normal attention span and concentration ability.   Speech is normal.  Cranial nerves: Extraocular movements show bilateral INO's with nystagmus. Pupils are equal, round, and reactive to light and accomodation.  Visual fields are full.  Facial symmetry is present. There is good facial sensation to soft touch bilaterally.Facial strength is normal.  Trapezius and sternocleidomastoid strength is normal. No dysarthria is noted.  The tongue is midline, and the patient has symmetric elevation of the soft palate. No obvious hearing deficits are noted.  Motor:  Muscle bulk is normal but tone is increased in left > right legs.  Strength is 4++ in right EHL, 5/5 elsewhere in right leg and both arms, EHL and ant. tib are 4-/5 on the left and she is 4/5 elsewhere in left leg.   Sensory: Sensory testing is intact to pinprick, soft touch, vibration sensation, and position sense in arms but there is decreased vib sense in left foot  Coordination: Cerebellar testing reveals good finger-nose-finger and right heel-to-shin but very poor left HTS.  Gait and station:  Station is unstable and she does better with unilateral support.   Positive Romberg,   . Wide stride can only do a few steps without unilateral support for balance.  Can not tandem  Reflexes: Deep tendon reflexes are increased on let more than right legs. Plantar responses are normal.    DIAGNOSTIC DATA (LABS, IMAGING, TESTING) - I reviewed patient records, labs, notes, testing and imaging myself where available.  Lab Results  Component Value Date   WBC 8.7 04/07/2007   HGB 13.2 04/07/2007   HCT 38.9 04/07/2007   MCV 91.0 04/07/2007   PLT 280 04/07/2007   No results found for: NA, K, CL, CO2, GLUCOSE, BUN, CREATININE, CALCIUM, PROT, ALBUMIN, AST, ALT, ALKPHOS, BILITOT, GFRNONAA, GFRAA No results found for: CHOL, HDL, LDLCALC, LDLDIRECT, TRIG, CHOLHDL No results found for: HGBA1C No results found for: VITAMINB12 No results found for: TSH No components found for: VITAMIND     ASSESSMENT AND PLAN  In summary, Sandy Haye is a 49 year old woman with relapsing remitting multiple sclerosis. Her main impairments involve poor gait due to left greater than right weakness spasticity and clumsiness. She also has fatigue.  Her MS appears to be stable on Tecfidera and she will continue. As a lymphopenia occurs in about 6% of people onTecfidera and the lead to increased risk of PML, I will check a CBC with differential. She will look into obtaining a bioness for her left foot drop.  She will return to see me in 4 - 5 months or call sooner if she has any new or worsening neurologic symptoms.   Richard A. Felecia Shelling, MD, PhD 6/83/4196, 22:29 AM Certified in Neurology, Clinical Neurophysiology, Sleep Medicine, Pain Medicine and Neuroimaging  [facility] [address], [place], [place] [address] [phone]

## 2014-09-29 ENCOUNTER — Telehealth: Payer: Self-pay | Admitting: *Deleted

## 2014-09-29 LAB — CBC WITH DIFFERENTIAL/PLATELET
BASOS ABS: 0 10*3/uL (ref 0.0–0.2)
Basos: 0 %
Eos: 2 %
Eosinophils Absolute: 0.1 10*3/uL (ref 0.0–0.4)
HCT: 41.2 % (ref 34.0–46.6)
Hemoglobin: 14.2 g/dL (ref 11.1–15.9)
IMMATURE GRANS (ABS): 0 10*3/uL (ref 0.0–0.1)
Immature Granulocytes: 0 %
Lymphocytes Absolute: 1.1 10*3/uL (ref 0.7–3.1)
Lymphs: 14 %
MCH: 31.3 pg (ref 26.6–33.0)
MCHC: 34.5 g/dL (ref 31.5–35.7)
MCV: 91 fL (ref 79–97)
MONOS ABS: 1 10*3/uL — AB (ref 0.1–0.9)
Monocytes: 12 %
NEUTROS ABS: 6 10*3/uL (ref 1.4–7.0)
Neutrophils Relative %: 72 %
RBC: 4.53 x10E6/uL (ref 3.77–5.28)
RDW: 13.9 % (ref 12.3–15.4)
WBC: 8.3 10*3/uL (ref 3.4–10.8)

## 2014-09-29 NOTE — Telephone Encounter (Signed)
Spoke with [name] and, per RAS,  advised her of normal lab results; she should continue Tecfidera as rx.  She verbalized understanding of same/fim

## 2015-01-02 ENCOUNTER — Other Ambulatory Visit: Payer: Self-pay | Admitting: Neurology

## 2015-01-03 ENCOUNTER — Telehealth: Payer: Self-pay | Admitting: Neurology

## 2015-01-03 NOTE — Telephone Encounter (Signed)
LMOM (identified vm) for [name] that vitamin d3 2,000iu is otc.  She does not need a rx. for this/fim

## 2015-01-03 NOTE — Telephone Encounter (Signed)
Patient requesting refill for Rx [facility] D3 2000 UNITS CAPS.  Please forward to [place].  Please call and advise.

## 2015-01-26 ENCOUNTER — Other Ambulatory Visit: Payer: Self-pay | Admitting: Internal Medicine

## 2015-01-26 DIAGNOSIS — R319 Hematuria, unspecified: Secondary | ICD-10-CM

## 2015-02-01 ENCOUNTER — Ambulatory Visit (INDEPENDENT_AMBULATORY_CARE_PROVIDER_SITE_OTHER): Payer: BLUE CROSS/BLUE SHIELD | Admitting: Neurology

## 2015-02-01 ENCOUNTER — Encounter: Payer: Self-pay | Admitting: Neurology

## 2015-02-01 VITALS — BP 110/74 | HR 78 | Resp 16 | Ht 67.0 in | Wt 242.0 lb

## 2015-02-01 DIAGNOSIS — R269 Unspecified abnormalities of gait and mobility: Secondary | ICD-10-CM | POA: Diagnosis not present

## 2015-02-01 DIAGNOSIS — R5383 Other fatigue: Secondary | ICD-10-CM | POA: Insufficient documentation

## 2015-02-01 DIAGNOSIS — G35 Multiple sclerosis: Secondary | ICD-10-CM | POA: Diagnosis not present

## 2015-02-01 DIAGNOSIS — M21372 Foot drop, left foot: Secondary | ICD-10-CM | POA: Diagnosis not present

## 2015-02-01 MED ORDER — VITAMIN B-12 500 MCG PO TABS: 1000.0000 ug | ORAL_TABLET | Freq: Every day | ORAL | Status: DC

## 2015-02-01 MED ORDER — CVS D3 50 MCG (2000 UT) PO CAPS: 2000.0000 [IU] | ORAL_CAPSULE | Freq: Every day | ORAL | Status: DC

## 2015-02-01 NOTE — Progress Notes (Signed)
[facility]  PATIENT: [name] DOB: 04/15/1966  REFERRING CLINICIAN: Willey Blade HISTORY FROM: Patient and mother REASON FOR VISIT: Multiple sclerosis   HISTORICAL  CHIEF COMPLAINT:  Chief Complaint  Patient presents with  . Multiple Sclerosis    Sts. she tolerates Tecfidera well.  She thinks walking is some better--she can take 4-5 steps without her walker as long as there is something nearby she can grab if she needs it. She had recent labwork at pcp's office (on 01-08-15) and brought a copy with her.  Lymphocytes were 1.3  Hgb A1c was 5.9.  Her pcp would like to do further testing for diabetes./fim    HISTORY OF PRESENT ILLNESS:  [name] is a 49 year old woman with relapsing remitting MS diagnosed in 1997.  MS History:   In 1997, she presented with backache, poor gait and visual blurring. She had an MRI and a lumbar puncture. The studies were consistent with multiple sclerosis.  She turned down a disease modifying therapy at that time. Her legs and her walking got completely better and she was back at baseline after the steroids. Over the next 15 years or so she had no additional symptoms. She had worsening symptoms in the left leg that was affecting her balance. MRI 2-3 years ago showed progression. She opted not to go on medication at that time.   She went to the [place] clinic at [facility] for several visits. At that time she was started on Tecfidera and she tolerates it well and has not had much flushing or GI symptoms. She has not had definite exacerbations. However, her gait has worsened and she has gone from a cane to a walker.  Gait/strength/sensation:  Her gait has been poor since 2012. She uses walker for long distances and cane/furniture for short distances.    The left leg is much worse than the right leg. She notes poor balance. No recent falls. She started using a cane after she saw Dr. Krista Blue about 2 years ago. This summer she started using a walker.    She has a left AFO but it has not helped recently.   She is going to look into the Bio[employer]ess device.   She denies any weakness in the arms. The legs, the left leg is much more clumsy than the right and she denies any ataxia in the arms.  We had discussed Ampyra in the past. She chose not to go on it because of the risk of seizures.  After B12 pills her numbness in the toes is better.     Bladder:   She feels her bladder is doing well. She has 0 - 1 nocturia.  Vision:  She denies any vision changes.  Fatigue:   She feels tired daily, especially in the evenings and she needs to go to bed earlier.  She sleeps well at night.     .  Mood/cognition:   She denies any depression or anxiety. However, her mother thinks that she is moody at times. She denies any cognitive dysfunction. She has no difficulty with short-term memory or getting words out.     Pain:    She had an episode of pain, likely left leg lumbar radiculopathy, last year.  She noted that her walking worsened with the pain and she went from a cane to walk her at that time. E  CBC with Diff 01/08/15 (outsie) shows lymphocyte count = 1.3   REVIEW OF SYSTEMS:  Constitutional: No fevers, chills, sweats,  or change in appetite Eyes: No visual changes, double vision, eye pain Ear, nose and throat: No hearing loss, ear pain, nasal congestion, sore throat Cardiovascular: No chest pain, palpitations Respiratory:  No shortness of breath at rest or with exertion.   No wheezes GastrointestinaI: No nausea, vomiting, diarrhea, abdominal pain, fecal incontinence Genitourinary:  No dysuria, urinary retention or frequency.  No nocturia. Musculoskeletal:  No neck pain.   Currently back pain is mild but increases with movements Integumentary: No rash, pruritus, skin lesions Neurological: as above Psychiatric: No major depression at this time -- but is moody.  No anxiety Endocrine: No palpitations, diaphoresis, change in appetite or increased  thirst Hematologic/Lymphatic:  No anemia, purpura, petechiae. Allergic/Immunologic: No itchy/runny eyes, nasal congestion, recent allergic reactions, rashes  ALLERGIES: Allergies  Allergen Reactions  . Food Other (See Comments)    Eyes swelling and throat swelling  . Oxycodone Hcl Hives  . Soy Allergy     Other reaction(s): SWELLING    HOME MEDICATIONS: Outpatient Prescriptions Prior to Visit  Medication Sig Dispense Refill  . CVS D3 2000 UNITS CAPS Take 2,000 Units by mouth. Take 2 capsules daily  5  . Dimethyl Fumarate 240 MG CPDR Take 240 mg by mouth.    [name] ibuprofen (ADVIL,MOTRIN) 200 MG tablet Take 200 mg by mouth every 6 (six) hours as needed (pain).    [name] diphenhydrAMINE (BENADRYL) 25 MG tablet Take 2 tablets (50 mg total) by mouth every 6 (six) hours. 60 tablet 0  . famotidine (PEPCID) 20 MG tablet Take 1 tablet (20 mg total) by mouth 2 (two) times daily. 20 tablet 0   No facility-administered medications prior to visit.    PAST MEDICAL HISTORY: Past Medical History  Diagnosis Date  . Multiple sclerosis   . Movement disorder     PAST SURGICAL HISTORY: Past Surgical History  Procedure Laterality Date  . Cesarean section    . Wisdom tooth extraction    . Uterine fibroid surgery      FAMILY HISTORY: Family History  Problem Relation Age of Onset  . Hypertension Mother   . Hypertension Father   . Diabetes Father     SOCIAL HISTORY:  History   Social History  . Marital Status: Married    Spouse Name: N/A  . Number of Children: N/A  . Years of Education: N/A   Occupational History  . Not on file.   Social History Main Topics  . Smoking status: Never Smoker   . Smokeless tobacco: Never Used  . Alcohol Use: No  . Drug Use: No  . Sexual Activity: Not on file   Other Topics Concern  . Not on file   Social History Narrative     PHYSICAL EXAM  Filed Vitals:   02/01/15 0900  BP: 110/74  Pulse: 78  Resp: 16  Height: 5\' 7"  (1.702 m)  Weight:  242 lb (109.77 kg)    Body mass index is 37.89 kg/(m^2).   General: The patient is well-developed and well-nourished and in no acute distress.   She uses a walker  Eyes:  Funduscopic exam shows normal optic discs and retinal vessels.  Neck: The neck is supple, no carotid bruits are noted.  The neck is nontender.  Respiratory: The respiratory examination is clear.  Cardiovascular: The cardiovascular examination reveals a regular rate and rhythm, no murmurs, gallops or rubs are noted.  Skin: Extremities are without significant edema.  Neurologic Exam  Mental status: The patient is alert and oriented  x 3 at the time of the examination. The patient has apparent normal recent and remote memory, with an apparently normal attention span and concentration ability.   Speech is normal.  Cranial nerves: Extraocular movements show bilateral INO's with nystagmus. Pupils are equal, round, and reactive to light and accomodation.  Visual fields are full.  Facial symmetry is present. There is good facial sensation to soft touch bilaterally.Facial strength is normal.  Trapezius and sternocleidomastoid strength is normal. No dysarthria is noted.  The tongue is midline, and the patient has symmetric elevation of the soft palate. No obvious hearing deficits are noted.  Motor:  Muscle bulk is normal but tone is increased in left > right legs.  Strength is 4+ in right EHL, 5/5 elsewhere in right leg and both arms, EHL and ant. tib are 4-/5 on the left and she is 4/5 elsewhere in left leg.   Sensory: Sensory testing is intact to pinprick, soft touch, vibration sensation, and position sense in arms but there is decreased vib and touch  sense in left foot  Coordination: Cerebellar testing reveals good finger-nose-finger and right heel-to-shin but very poor left HTS.  Gait and station: Station is unstable and she does better with unilateral support.   Positive Romberg,   . Wide stride with left foot drop and can  only do a few steps without unilateral support for balance.  Can not tandem  Reflexes: Deep tendon reflexes are increased on let more than right legs. Plantar responses are normal.    DIAGNOSTIC DATA (LABS, IMAGING, TESTING) - I reviewed patient records, labs, notes, testing and imaging myself where available.  Lab Results  Component Value Date   WBC 8.3 09/28/2014   HGB 14.2 09/28/2014   HCT 41.2 09/28/2014   MCV 91 09/28/2014   PLT 280 04/07/2007      ASSESSMENT AND PLAN  Multiple sclerosis - Plan: MR Brain W Wo Contrast, CANCELED: CBC with Differential/Platelet  Gait disturbance  Left foot drop  Other fatigue    1.   Continue Tecfidera, CBC checked in [facility] and was fine (lymphocyte count =1.3) 2.   Continue Vit D and B12 as she was deficient 3.   Stay active and exercise a tolerated.   Use walker for safety.   I believe she is unable to work due to gait issues and easy fatigue. 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 She has not been able to get this yet and is working with a Chief Executive Officer 4.   rtc 4-5 months, sooner if problems   Richard A. Felecia Shelling, MD, PhD 7/62/8315, 1:76 AM Certified in Neurology, Clinical Neurophysiology, Sleep Medicine, Pain Medicine and Neuroimaging  [facility] [address], [place], [place] [address] [phone]    g \\

## 2015-02-05 ENCOUNTER — Telehealth: Payer: Self-pay | Admitting: Neurology

## 2015-02-05 DIAGNOSIS — G35 Multiple sclerosis: Secondary | ICD-10-CM

## 2015-02-05 DIAGNOSIS — M21372 Foot drop, left foot: Secondary | ICD-10-CM

## 2015-02-05 DIAGNOSIS — R269 Unspecified abnormalities of gait and mobility: Secondary | ICD-10-CM

## 2015-02-05 NOTE — Telephone Encounter (Signed)
LMTC./fim 

## 2015-02-05 NOTE — Telephone Encounter (Signed)
mri is scheduled for 06/06/15 same as f/u appt.  Pt states she is having gait difficulites and normally she has the cspine and thoracic or lumbar done as well.  Please advise if patient is to have additional scans.  Also she wants them on the same day as appointment and would like to know if they can be read before scheduled afternoon appt.

## 2015-02-06 ENCOUNTER — Other Ambulatory Visit: Payer: Self-pay

## 2015-02-06 NOTE — Addendum Note (Signed)
Addended by: France Ravens I on: 02/06/2015 10:33 AM   Modules accepted: Orders

## 2015-02-06 NOTE — Telephone Encounter (Signed)
[place].  Per RAS, since she feels she is having more difficulty walking, it is ok to add mri c-spine without contrast.  I have ordered this and let Larene Beach know so that it can be scheduled on the same day as her mri brain.  Dr. Felecia Shelling will read mri's during her f/u office visit, providing that the mri's are done w/i the [facility] system, so that he can see images/fim

## 2015-02-16 ENCOUNTER — Ambulatory Visit
Admission: RE | Admit: 2015-02-16 | Discharge: 2015-02-16 | Disposition: A | Discharge status: Home / Self Care | Payer: BLUE CROSS/BLUE SHIELD | Source: Ambulatory Visit | Attending: Internal Medicine | Admitting: Internal Medicine

## 2015-02-16 DIAGNOSIS — R319 Hematuria, unspecified: Secondary | ICD-10-CM

## 2015-02-23 ENCOUNTER — Telehealth: Payer: Self-pay | Admitting: Neurology

## 2015-02-23 NOTE — Telephone Encounter (Signed)
Patient returning Faith's call from 02/05/15. She also has a question concerning ROLFING therapy. Please call and advise. Patient can be reached at [phone].

## 2015-02-23 NOTE — Telephone Encounter (Signed)
I have spoken with [name] this morning.  I do not see a note from 2 weeks ago--I do not have a message for her at this time.  She asked if I am aware of rolfing therapy--I have advised I am not aware of this./fim

## 2015-06-06 ENCOUNTER — Ambulatory Visit (INDEPENDENT_AMBULATORY_CARE_PROVIDER_SITE_OTHER): Payer: BLUE CROSS/BLUE SHIELD | Admitting: Neurology

## 2015-06-06 ENCOUNTER — Ambulatory Visit: Payer: BLUE CROSS/BLUE SHIELD | Admitting: Neurology

## 2015-06-06 ENCOUNTER — Encounter: Payer: Self-pay | Admitting: Neurology

## 2015-06-06 ENCOUNTER — Ambulatory Visit (INDEPENDENT_AMBULATORY_CARE_PROVIDER_SITE_OTHER): Payer: BLUE CROSS/BLUE SHIELD

## 2015-06-06 VITALS — BP 126/88 | HR 80 | Resp 18 | Ht 67.0 in | Wt 239.0 lb

## 2015-06-06 DIAGNOSIS — G35 Multiple sclerosis: Secondary | ICD-10-CM | POA: Diagnosis not present

## 2015-06-06 DIAGNOSIS — M21372 Foot drop, left foot: Secondary | ICD-10-CM | POA: Diagnosis not present

## 2015-06-06 DIAGNOSIS — R269 Unspecified abnormalities of gait and mobility: Secondary | ICD-10-CM

## 2015-06-06 DIAGNOSIS — R5383 Other fatigue: Secondary | ICD-10-CM

## 2015-06-06 MED ORDER — GADOPENTETATE DIMEGLUMINE 469.01 MG/ML IV SOLN: 20.0000 mL | Freq: Once | INTRAVENOUS | Status: DC | PRN

## 2015-06-06 MED ORDER — NALTREXONE HCL 50 MG PO TABS: ORAL_TABLET | ORAL | Status: DC

## 2015-06-06 NOTE — Progress Notes (Signed)
[facility]  PATIENT: [name] DOB: 02-01-1966  REFERRING CLINICIAN: Willey Blade HISTORY FROM: Patient and mother REASON FOR VISIT: Multiple sclerosis   HISTORICAL  CHIEF COMPLAINT:  Chief Complaint  Patient presents with  . Multiple Sclerosis    Sts. she continues to tolerate Tecfidera well.  Sts. she feels walking is some better with physical therapy./fim    HISTORY OF PRESENT ILLNESS:  [name] is a 49 year old woman with relapsing remitting MS diagnosed in 1997.   She is on Tecfidera and tolerates it well.     MS History:   In 1997, she presented with backache, poor gait and visual blurring. She had an MRI and a lumbar puncture. The studies were consistent with multiple sclerosis.  She turned down a disease modifying therapy at that time. Her legs and her walking got completely better and she was back at baseline after the steroids. Over the next 15 years or so she had no additional symptoms. She had worsening symptoms in the left leg that was affecting her balance. MRI 2-3 years ago showed progression. She opted not to go on medication at that time.   She went to the [place] clinic at [facility] for several visits. At that time she was started on Tecfidera and she tolerates it well and has not had much flushing or GI symptoms. She has not had definite exacerbations. However, her gait has worsened and she has gone from a cane to a walker.  Gait/strength/sensation:  She has fallen more, last time 1 month ago.   The left leg is much worse than the right leg. She notes poor balance. Her gait has been poor since 2012. She uses walker for long distances and cane/furniture for short distances.   She has been using cane since 2012 and walker since this summer she started  She has a left AFO but it has not helped recently.  She drags more on some floors.  She denies any weakness in the arms. The left leg has weakness, spasticity and ataxia.   Right leg issues are milder.   We had discussed Ampyra in the past. 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 She has 0 - 1 nocturia.  Vision:  She denies any vision changes.  Fatigue:   She feels tired some evenings and she needs to go to bed earlier.  She sleeps well at night.     .  Mood/cognition:   She denies any depression or anxiety. However, her mother thinks that she is moody at times. She denies any cognitive dysfunction. She has no difficulty with short-term memory or getting words out.     Her back and left leg pain resolved  (c/w radiculopathy)  REVIEW OF SYSTEMS:  Constitutional: No fevers, chills, sweats, or change in appetite Eyes: No visual changes, double vision, eye pain Ear, nose and throat: No hearing loss, ear pain, nasal congestion, sore throat Cardiovascular: No chest pain, palpitations Respiratory:  No shortness of breath at rest or with exertion.   No wheezes GastrointestinaI: No nausea, vomiting, diarrhea, abdominal pain, fecal incontinence Genitourinary:  No dysuria, urinary retention or frequency.  No nocturia. Musculoskeletal:  No neck pain.   Currently back pain is mild but increases with movements Integumentary: No rash, pruritus, skin lesions Neurological: as above Psychiatric: No major depression at this time --  but is moody.  No anxiety Endocrine: No palpitations, diaphoresis, change in appetite or increased thirst Hematologic/Lymphatic:  No anemia, purpura, petechiae. Allergic/Immunologic: No itchy/runny eyes, nasal congestion, recent allergic reactions, rashes  ALLERGIES: Allergies  Allergen Reactions  . Food Other (See Comments)    Eyes swelling and throat swelling  . Oxycodone Hcl Hives  . Soy Allergy     Other reaction(s): SWELLING    HOME MEDICATIONS: Outpatient Prescriptions Prior to Visit  Medication Sig Dispense Refill  . CVS D3 2000 UNITS CAPS Take  1 capsule (2,000 Units total) by mouth daily. Take 2 capsules daily 30 each 11  . Dimethyl Fumarate 240 MG CPDR Take 240 mg by mouth.    [name] ibuprofen (ADVIL,MOTRIN) 200 MG tablet Take 200 mg by mouth every 6 (six) hours as needed (pain).    . vitamin B-12 (CYANOCOBALAMIN) 500 MCG tablet Take 2 tablets (1,000 mcg total) by mouth daily. 60 tablet 11  . diphenhydrAMINE (BENADRYL) 25 MG tablet Take 2 tablets (50 mg total) by mouth every 6 (six) hours. 60 tablet 0  . famotidine (PEPCID) 20 MG tablet Take 1 tablet (20 mg total) by mouth 2 (two) times daily. 20 tablet 0   No facility-administered medications prior to visit.    PAST MEDICAL HISTORY: Past Medical History  Diagnosis Date  . Multiple sclerosis   . Movement disorder     PAST SURGICAL HISTORY: Past Surgical History  Procedure Laterality Date  . Cesarean section    . Wisdom tooth extraction    . Uterine fibroid surgery      FAMILY HISTORY: Family History  Problem Relation Age of Onset  . Hypertension Mother   . Hypertension Father   . Diabetes Father     SOCIAL HISTORY:  Social History   Social History  . Marital Status: Married    Spouse Name: N/A  . Number of Children: N/A  . Years of Education: N/A   Occupational History  . Not on file.   Social History Main Topics  . Smoking status: Never Smoker   . Smokeless tobacco: Never Used  . Alcohol Use: No  . Drug Use: No  . Sexual Activity: Not on file   Other Topics Concern  . Not on file   Social History Narrative     PHYSICAL EXAM  Filed Vitals:   06/06/15 0907  BP: 126/88  Pulse: 80  Resp: 18  Height: 5\' 7"  (1.702 m)  Weight: 239 lb (108.41 kg)    Body mass index is 37.42 kg/(m^2).   General: The patient is well-developed and well-nourished and in no acute distress.   She uses a walker  Eyes:  Funduscopic exam shows normal optic discs and retinal vessels.  Neck: The neck is supple, no carotid bruits are noted.  The neck is  nontender.  Respiratory: The respiratory examination is clear.  Cardiovascular: The cardiovascular examination reveals a regular rate and rhythm, no murmurs, gallops or rubs are noted.  Skin: Extremities are without significant edema.  Neurologic Exam  Mental status: The patient is alert and oriented x 3 at the time of the examination. The patient has apparent normal recent and remote memory, with an apparently normal attention span and concentration ability.   Speech is normal.  Cranial nerves: Extraocular movements show bilateral INO's with nystagmus. Pupils are equal, round, and reactive to light and accomodation.  Visual fields are full.  Facial symmetry is present. There is good facial sensation to soft touch bilaterally.Facial strength is  normal.  Trapezius and sternocleidomastoid strength is normal. No dysarthria is noted.  The tongue is midline, and the patient has symmetric elevation of the soft palate. No obvious hearing deficits are noted.  Motor:  Muscle bulk is normal but tone is increased in left > right legs.  Strength is 4+ in right EHL, 5/5 elsewhere in right leg and both arms, EHL and ant. tib are 4-/5 on the left and she is 4/5 elsewhere in left leg.   Sensory: Sensory testing is intact to pinprick, soft touch, vibration sensation, and position sense in arms but there is decreased vib and touch  sense in left foot  Coordination: Cerebellar testing reveals good finger-nose-finger and right heel-to-shin but very poor left HTS.  Gait and station: Station is unstable and she does better with unilateral support.   Positive Romberg,   . Wide stride with left foot drop and can only do a few steps without unilateral support for balance.  Can not tandem  Reflexes: Deep tendon reflexes are increased on let more than right legs. Plantar responses are normal.    DIAGNOSTIC DATA (LABS, IMAGING, TESTING) - I reviewed patient records, labs, notes, testing and imaging myself where  available.  Lab Results  Component Value Date   WBC 8.3 09/28/2014   HGB 14.2 09/28/2014   HCT 41.2 09/28/2014   MCV 91 09/28/2014   PLT 280 04/07/2007      ASSESSMENT AND PLAN  No diagnosis found.   1.   Continue Tecfidera and check CBC.    She will be having an MRI of the cervical spine later this morning. I will reevaluate the scans later in the day or tomorrow and let her know the results. If she is having subclinical progression of the MS, I would recommend that we switch her medications to either Tysabri or [place] or Zinbryta 2.   Continue Vit D and B12 as she was deficient 3.   Stay active and exercise a tolerated.   Use walker for safety.  We discussed using a recumbent exercise bike..  4.   She has her a lot about low-dose naltrexone as a treatment for MS. We had a long discussion about the lack of any decent studies. It is possible that it can help her fatigue though I am less certain that it would have any beneficial effect long-term for the MS. It is a safe treatment and relatively inexpensive, so I am okay with her giving it a try as long as she stays on her Tecfidera or other DMT.   rtc 4-5 months, sooner if problems   45 minute face-to-face with greater than one half of the visit counseling or coordinating care about her multiple sclerosis prognosis, difficulties with gait and other aspects of her disease and treatment.     Takahiro Godinho A. Felecia Shelling, MD, PhD 0/56/9794, 8:01 AM Certified in Neurology, Clinical Neurophysiology, Sleep Medicine, Pain Medicine and Neuroimaging  [facility] [address], [place], [place] [address] [phone]    g \\

## 2015-06-07 LAB — CBC WITH DIFFERENTIAL/PLATELET
BASOS ABS: 0 10*3/uL (ref 0.0–0.2)
BASOS: 0 %
EOS (ABSOLUTE): 0.1 10*3/uL (ref 0.0–0.4)
Eos: 2 %
Hematocrit: 41.6 % (ref 34.0–46.6)
Hemoglobin: 14 g/dL (ref 11.1–15.9)
Immature Grans (Abs): 0 10*3/uL (ref 0.0–0.1)
Immature Granulocytes: 0 %
LYMPHS ABS: 1.3 10*3/uL (ref 0.7–3.1)
Lymphs: 18 %
MCH: 29.9 pg (ref 26.6–33.0)
MCHC: 33.7 g/dL (ref 31.5–35.7)
MCV: 89 fL (ref 79–97)
Monocytes Absolute: 0.6 10*3/uL (ref 0.1–0.9)
Monocytes: 9 %
Neutrophils Absolute: 4.8 10*3/uL (ref 1.4–7.0)
Neutrophils: 71 %
Platelets: 271 10*3/uL (ref 150–379)
RBC: 4.69 x10E6/uL (ref 3.77–5.28)
RDW: 15 % (ref 12.3–15.4)
WBC: 6.8 10*3/uL (ref 3.4–10.8)

## 2015-06-14 ENCOUNTER — Telehealth: Payer: Self-pay | Admitting: *Deleted

## 2015-06-14 NOTE — Telephone Encounter (Signed)
I have spoken with [name] this morning, and per RAS, advised that mri c-spine showed 4 spots that are probably responsible for her gait disturbance, and mri brain showed spots consistent with MS.  None of the spots in the brain or neck look new.  She verbalized understanding of same.  4 mo. follow up appt. made/fim

## 2015-06-14 NOTE — Telephone Encounter (Signed)
-----   Message from Britt Bottom, MD sent at 06/08/2015  5:37 PM EDT ----- Please let her know that the MRI of the cervical spine shows 4 foci within the spinal cord. That likely explains her gait difficulties. The MRI of the brain shows spots consistent with multiple sclerosis. None of the spots in the brain or spinal cord appears to be new  Blood work looks good.

## 2015-06-15 ENCOUNTER — Other Ambulatory Visit: Payer: Self-pay

## 2015-06-15 DIAGNOSIS — Z1231 Encounter for screening mammogram for malignant neoplasm of breast: Secondary | ICD-10-CM

## 2015-06-19 ENCOUNTER — Telehealth: Payer: Self-pay | Admitting: Neurology

## 2015-06-19 NOTE — Telephone Encounter (Signed)
Patient called regarding stopping naltrexone (DEPADE) 50 MG tablet. Please call back at [phone].

## 2015-06-19 NOTE — Telephone Encounter (Signed)
I have spoken with [name] this morning.  She sts. she has decided to stop Naltrexone, as she does not feel it is helping.  I will let RAS know, and remove it from her med list/fim

## 2015-07-01 ENCOUNTER — Other Ambulatory Visit: Payer: Self-pay | Admitting: Neurology

## 2015-08-24 ENCOUNTER — Ambulatory Visit: Payer: BLUE CROSS/BLUE SHIELD

## 2015-09-19 ENCOUNTER — Other Ambulatory Visit: Payer: Self-pay | Admitting: Neurology

## 2015-09-26 ENCOUNTER — Ambulatory Visit: Payer: BLUE CROSS/BLUE SHIELD

## 2015-10-03 ENCOUNTER — Ambulatory Visit: Payer: Self-pay | Admitting: Neurology

## 2015-10-23 ENCOUNTER — Ambulatory Visit (INDEPENDENT_AMBULATORY_CARE_PROVIDER_SITE_OTHER): Payer: BLUE CROSS/BLUE SHIELD | Admitting: Neurology

## 2015-10-23 ENCOUNTER — Encounter: Payer: Self-pay | Admitting: Neurology

## 2015-10-23 VITALS — BP 116/74 | HR 88 | Resp 18 | Ht 67.0 in | Wt 247.8 lb

## 2015-10-23 DIAGNOSIS — M21372 Foot drop, left foot: Secondary | ICD-10-CM | POA: Diagnosis not present

## 2015-10-23 DIAGNOSIS — R5383 Other fatigue: Secondary | ICD-10-CM | POA: Diagnosis not present

## 2015-10-23 DIAGNOSIS — R269 Unspecified abnormalities of gait and mobility: Secondary | ICD-10-CM | POA: Diagnosis not present

## 2015-10-23 DIAGNOSIS — G35 Multiple sclerosis: Secondary | ICD-10-CM

## 2015-10-23 DIAGNOSIS — E559 Vitamin D deficiency, unspecified: Secondary | ICD-10-CM | POA: Diagnosis not present

## 2015-10-23 NOTE — Progress Notes (Signed)
[facility]  PATIENT: [name] DOB: 1965-11-23  REFERRING CLINICIAN: Willey Blade HISTORY FROM: Patient and mother REASON FOR VISIT: Multiple sclerosis   HISTORICAL  CHIEF COMPLAINT:  Chief Complaint  Patient presents with  . Multiple Sclerosis    Sts. she continues to tolerate Tecfidera well.  She thinks gait is about the same.  Thinks balance is better due to participation in physical therapy.  She is ambulatory with a rolling walker, dragging both feet some.  She has a left afo but doesn't use it./fim    HISTORY OF PRESENT ILLNESS:  [name] is a 50 year old woman with relapsing remitting MS diagnosed in 1997.   She is on Tecfidera and tolerates it well.   She denies any recent exacerbation. Her last MRI of the brain was 06/08/2015. It did not show any enhancing lesions.  Gait/strength/sensation:  She feels gait is slightly better.   She can walk without a cane/walker if she is well rested but generally uses a walker if > 5 steps.     Stairs are difficult.     The left leg is much worse than the right leg. She notes poor balance and she has used a walker x 1 1/2  years (cane x 2 years before that).    She has a left AFO but it has not helped recently.  She drags more on some floors.  She denies any weakness in the arms. The left leg has weakness, spasticity and ataxia.   Right leg issues are milder.  We had discussed Ampyra in the past. 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    She has a left AFO but has not worn it much.      Bladder:   She feels her bladder is doing well. She has 0 - 1 nocturia.  Vision:  She denies any vision changes.  Fatigue:   She wakes up feeling well but feels fatigued by the evening most days.   She takes a nap with benefit many days.  She sleeps well at night.   She does not snore.       .  Mood/cognition:   She denies any depression or anxiety.  However, her mother thinks that she is moody at times. She denies any cognitive dysfunction. She has no difficulty with short-term memory or getting words out.     Her back and radicular left leg pain resolved.     MS History:   In 1997, she presented with backache, poor gait and visual blurring. She had an MRI and a lumbar puncture. The studies were consistent with multiple sclerosis.  She turned down a disease modifying therapy at that time. Her legs and her walking got completely better and she was back at baseline after the steroids. Over the next 15 years or so she had no additional symptoms. She had worsening symptoms in the left leg that was affecting her balance. MRI 2-3 years ago showed progression. She opted not to go on medication at that time.   She went to the [place] clinic at [facility] for several visits. At that time she was started on Tecfidera and she tolerates it well and has not had much flushing or GI symptoms. She has not had definite exacerbations. However, her gait has worsened and she has gone from a cane to a walker.  REVIEW OF SYSTEMS:  Constitutional: No fevers, chills,  sweats, or change in appetite Eyes: No visual changes, double vision, eye pain Ear, nose and throat: No hearing loss, ear pain, nasal congestion, sore throat Cardiovascular: No chest pain, palpitations Respiratory:  No shortness of breath at rest or with exertion.   No wheezes GastrointestinaI: No nausea, vomiting, diarrhea, abdominal pain, fecal incontinence Genitourinary:  No dysuria, urinary retention or frequency.  No nocturia. Musculoskeletal:  No neck pain.   Currently back pain is mild but increases with movements Integumentary: No rash, pruritus, skin lesions Neurological: as above Psychiatric: No major depression at this time -- but is moody.  No anxiety Endocrine: No palpitations, diaphoresis, change in appetite or increased thirst Hematologic/Lymphatic:  No anemia, purpura,  petechiae. Allergic/Immunologic: No itchy/runny eyes, nasal congestion, recent allergic reactions, rashes  ALLERGIES: Allergies  Allergen Reactions  . Food Other (See Comments)    Eyes swelling and throat swelling  . Oxycodone Hcl Hives  . Soy Allergy     Other reaction(s): SWELLING    HOME MEDICATIONS: Outpatient Prescriptions Prior to Visit  Medication Sig Dispense Refill  . [facility] D3 2000 UNITS CAPS Take 1 capsule (2,000 Units total) by mouth daily. Take 2 capsules daily 30 each 11  . ibuprofen (ADVIL,MOTRIN) 200 MG tablet Take 200 mg by mouth every 6 (six) hours as needed (pain).    . TECFIDERA 240 MG CPDR TAKE ONE CAPSULE (240 MG) BY MOUTH TWICE DAILY 180 capsule 0  . vitamin B-12 (CYANOCOBALAMIN) 500 MCG tablet Take 2 tablets (1,000 mcg total) by mouth daily. 60 tablet 11  . diphenhydrAMINE (BENADRYL) 25 MG tablet Take 2 tablets (50 mg total) by mouth every 6 (six) hours. 60 tablet 0   Facility-Administered Medications Prior to Visit  Medication Dose Route Frequency Provider Last Rate Last Dose  . gadopentetate dimeglumine (MAGNEVIST) injection 20 mL  20 mL Intravenous Once PRN Britt Bottom, MD        PAST MEDICAL HISTORY: Past Medical History  Diagnosis Date  . Multiple sclerosis ([place])   . Movement disorder     PAST SURGICAL HISTORY: Past Surgical History  Procedure Laterality Date  . Cesarean section    . Wisdom tooth extraction    . Uterine fibroid surgery      FAMILY HISTORY: Family History  Problem Relation Age of Onset  . Hypertension Mother   . Hypertension Father   . Diabetes Father     SOCIAL HISTORY:  Social History   Social History  . Marital Status: Married    Spouse Name: N/A  . Number of Children: N/A  . Years of Education: N/A   Occupational History  . Not on file.   Social History Main Topics  . Smoking status: Never Smoker   . Smokeless tobacco: Never Used  . Alcohol Use: No  . Drug Use: No  . Sexual Activity: Not on file    Other Topics Concern  . Not on file   Social History Narrative     PHYSICAL EXAM  Filed Vitals:   10/23/15 0942  BP: 116/74  Pulse: 88  Resp: 18  Height: 5\' 7"  (1.702 m)  Weight: 247 lb 12.8 oz (112.401 kg)    Body mass index is 38.8 kg/(m^2).   General: The patient is well-developed and well-nourished and in no acute distress.   She uses a walker   Neurologic Exam  Mental status: The patient is alert and oriented x 3 at the time of the examination. The patient has apparent normal recent and remote  memory, with an apparently normal attention span and concentration ability.   Speech is normal.  Cranial nerves: Extraocular movements show bilateral INO's with nystagmus. Pupils are equal, round, and reactive to light and accomodation.  Visual fields are full.  Facial symmetry is present. There is good facial sensation to soft touch bilaterally.Facial strength is normal.  Trapezius and sternocleidomastoid strength is normal. No dysarthria is noted.  The tongue is midline, and the patient has symmetric elevation of the soft palate. No obvious hearing deficits are noted.  Motor:  Muscle bulk is normal but tone is increased in left > right legs.  Strength is 4+ in right EHL, 5/5 elsewhere in right leg and both arms, EHL and ant. tib are 4-/5 on the left and she is 4/5 elsewhere in left leg.   Sensory: Sensory testing is intact to touch and vibration sensation, and position sense in arms but there is decreased vib and touch  sense in left foot  Coordination: Cerebellar testing reveals good finger-nose-finger and right heel-to-shin but very poor left HTS.  Gait and station: Station is unstable and she does better with unilateral support.   Positive Romberg,   . Wide stride with left foot drop and needs unilateral support for balance.    Reflexes: Deep tendon reflexes are increased on let more than right legs.     DIAGNOSTIC DATA (LABS, IMAGING, TESTING) - I reviewed patient  records, labs, notes, testing and imaging myself where available.  Lab Results  Component Value Date   WBC 6.8 06/06/2015   HGB 14.2 09/28/2014   HCT 41.6 06/06/2015   MCV 89 06/06/2015   PLT 271 06/06/2015      ASSESSMENT AND PLAN  Multiple sclerosis ([place]) - Plan: CBC with Differential/Platelet, VITAMIN D 25 Hydroxy (Vit-D Deficiency, Fractures)  Gait disturbance  Left foot drop  Other fatigue  Avitaminosis D - Plan: VITAMIN D 25 Hydroxy (Vit-D Deficiency, Fractures)    1.   Continue Tecfidera and check CBC.  Also check Vit D as low in past. 2.   Continue Vit D and B12 as she was deficient in past 3.   Stay active and exercise a tolerated.   Use walker for safety.  Use recumbent exercise bike.[name]  4.    rtc 4-5 months, sooner if problems     Richard A. Felecia Shelling, MD, PhD [id number], 0000000 AM Certified in Neurology, Clinical Neurophysiology, Sleep Medicine, Pain Medicine and Neuroimaging  [facility] [address] [place], [place] [address] [phone]    g \\

## 2015-10-24 ENCOUNTER — Telehealth: Payer: Self-pay | Admitting: *Deleted

## 2015-10-24 LAB — CBC WITH DIFFERENTIAL/PLATELET
BASOS ABS: 0 10*3/uL (ref 0.0–0.2)
Basos: 0 %
EOS (ABSOLUTE): 0.1 10*3/uL (ref 0.0–0.4)
Eos: 2 %
Hematocrit: 41.1 % (ref 34.0–46.6)
Hemoglobin: 14 g/dL (ref 11.1–15.9)
IMMATURE GRANS (ABS): 0 10*3/uL (ref 0.0–0.1)
IMMATURE GRANULOCYTES: 0 %
LYMPHS: 19 %
Lymphocytes Absolute: 1.2 10*3/uL (ref 0.7–3.1)
MCH: 30.5 pg (ref 26.6–33.0)
MCHC: 34.1 g/dL (ref 31.5–35.7)
MCV: 90 fL (ref 79–97)
MONOCYTES: 9 %
Monocytes Absolute: 0.6 10*3/uL (ref 0.1–0.9)
Neutrophils Absolute: 4.2 10*3/uL (ref 1.4–7.0)
Neutrophils: 70 %
Platelets: 277 10*3/uL (ref 150–379)
RBC: 4.59 x10E6/uL (ref 3.77–5.28)
RDW: 14.1 % (ref 12.3–15.4)
WBC: 6.1 10*3/uL (ref 3.4–10.8)

## 2015-10-24 LAB — VITAMIN D 25 HYDROXY (VIT D DEFICIENCY, FRACTURES): VIT D 25 HYDROXY: 48.5 ng/mL (ref 30.0–100.0)

## 2015-10-24 NOTE — Telephone Encounter (Signed)
LMTC./fim 

## 2015-10-24 NOTE — Telephone Encounter (Signed)
-----   Message from Britt Bottom, MD sent at 10/24/2015  8:35 AM EST ----- Please better now that the blood count and a vitamin D level looks good.   She should still continue to take over-the-counter vitamin D.

## 2015-10-24 NOTE — Telephone Encounter (Signed)
I have spoken with [name] this afternoon and per RAS, advised that labs, including vit. d level look good.  She should still continue to take otc vitamin d.  She verbalized understanding of same/fim

## 2015-12-07 ENCOUNTER — Telehealth: Payer: Self-pay | Admitting: Neurology

## 2015-12-07 NOTE — Telephone Encounter (Signed)
I have spoken with [name] to see what sort of letter she needs.  She sts. she needs documentation of her disabilities, such as her difficulty with ambulation.  I have mailed her copies of all ov with RAS, as RAS as most thoroughly documented her disabilities in these notes.  She may submit these notes with her disability claim.  She is agreeable with this plan.  Notes mailed to her home address on [address]/fim

## 2015-12-07 NOTE — Telephone Encounter (Signed)
Patient called to request note regarding her case for hearing that is scheduled for 03/04/16 for disability.

## 2015-12-18 ENCOUNTER — Telehealth: Payer: Self-pay | Admitting: Neurology

## 2015-12-18 NOTE — Telephone Encounter (Signed)
Patient called to say "thank you to Faith for the package that she mailed"

## 2015-12-18 NOTE — Telephone Encounter (Signed)
noted/fim 

## 2015-12-18 NOTE — Telephone Encounter (Signed)
I have spoken with [name] this afternoon.  She sts. another MD advised she take vit. B12 for lower extremity edema.  I have advised vit. B12 should not cause or improve this condition.  She verbalized understanding of same.  She denies shortness of breath. I have advised elevation, low Na diet, plenty of water, and f/u with pcp if edema persists or worsens.  She verbalized understanding of same./fim

## 2015-12-18 NOTE — Telephone Encounter (Signed)
Patient called to advise, B12 doesn't seem to be as potent, not working as well, would like to know if she should double it or change brands? "Ankles are real tight and pressure around ankles". Please call to advise.

## 2016-01-01 ENCOUNTER — Other Ambulatory Visit: Payer: Self-pay | Admitting: Neurology

## 2016-01-22 ENCOUNTER — Telehealth: Payer: Self-pay | Admitting: Neurology

## 2016-01-22 NOTE — Telephone Encounter (Signed)
[place] [phone] called to check status of records request that was faxed on 12/18/15, states she hasn't heard back from anyone. "Client has a hearing coming before the judge and we really need those records".

## 2016-01-28 NOTE — Telephone Encounter (Signed)
Request sent to [employer] on 01/02/16.Please call [employer] at [phone].

## 2016-02-05 ENCOUNTER — Other Ambulatory Visit: Payer: Self-pay | Admitting: Neurology

## 2016-02-12 ENCOUNTER — Telehealth: Payer: Self-pay | Admitting: Neurology

## 2016-02-12 NOTE — Telephone Encounter (Signed)
Message For: MED RECORDS          Taken 30-MAY-17 at  1:51PM by [facility] ------------------------------------------------------------  Caller  NATALIE AT [facility]   CID  [id number]   Patient  [name]        Pt's Dr  NOT [place]  [id number]                                                                                  Disp:Y/N  N  If Y = C/B If No Response In 23minutes  ============================================================

## 2016-02-13 ENCOUNTER — Telehealth: Payer: Self-pay | Admitting: Neurology

## 2016-02-13 MED ORDER — VITAMIN B-12 500 MCG PO TABS: 1000.0000 ug | ORAL_TABLET | Freq: Every day | ORAL | Status: DC

## 2016-02-13 NOTE — Telephone Encounter (Signed)
I have spoken with pt.  [facility] per [facility] (if rx. is sent in, they will let her pick it up at the drive thru window--she has gait disturbance and it is difficult for her to walk into the store.)  She also needed to move her July appt. to August, due to vacation plans, and I have done this for her/fim

## 2016-02-13 NOTE — Telephone Encounter (Signed)
Message For: OFFICE               Taken 30-MAY-17 at  4:47PM by [facility] ------------------------------------------------------------  [name]              CID  [id number]   Patient  -CALLER-              Pt's Dr  Felecia Shelling         Area Code  67  Phone#  [address] [place]  02/04/1966       RE  NEEDS B12 CALLED INTO PHARMACY, ASKED TO [place]. [facility]#:[phone]. NOT E.R>>   Disp:Y/N  N  If Y = C/B If No Response In 83minutes   >>FOR TODAY, CAN BE CALLED IN TOMORROW 5/31 IF        NEEDED.

## 2016-02-14 ENCOUNTER — Telehealth: Payer: Self-pay | Admitting: *Deleted

## 2016-02-14 NOTE — Telephone Encounter (Signed)
Taken  1-JUN-17 at 11:31AM by TRL ------------------------------------------------------------ Kennedy Bucker   CID [id number]  Patient [name]       Pt's Dr NOT Haig Prophet     Area Code 336 Phone# [phone]      RE NEEDS TO SPEAK WITH MEDICAL RECORDS  ASAP                                                              Disp:Y/N N If Y = C/B If No Response In 38minutes ============================================================  Notes faxed on 02/14/16. To [facility]

## 2016-03-03 NOTE — Telephone Encounter (Signed)
Pt called, need to know when was her first office visit.

## 2016-03-25 ENCOUNTER — Ambulatory Visit: Payer: BLUE CROSS/BLUE SHIELD | Admitting: Neurology

## 2016-04-22 ENCOUNTER — Encounter: Payer: Self-pay | Admitting: Neurology

## 2016-04-22 ENCOUNTER — Ambulatory Visit (INDEPENDENT_AMBULATORY_CARE_PROVIDER_SITE_OTHER): Payer: BLUE CROSS/BLUE SHIELD | Admitting: Neurology

## 2016-04-22 VITALS — BP 110/68 | HR 70 | Resp 18 | Ht 67.0 in | Wt 247.0 lb

## 2016-04-22 DIAGNOSIS — R350 Frequency of micturition: Secondary | ICD-10-CM | POA: Insufficient documentation

## 2016-04-22 DIAGNOSIS — R269 Unspecified abnormalities of gait and mobility: Secondary | ICD-10-CM

## 2016-04-22 DIAGNOSIS — R5383 Other fatigue: Secondary | ICD-10-CM

## 2016-04-22 DIAGNOSIS — E559 Vitamin D deficiency, unspecified: Secondary | ICD-10-CM

## 2016-04-22 DIAGNOSIS — M21372 Foot drop, left foot: Secondary | ICD-10-CM | POA: Diagnosis not present

## 2016-04-22 DIAGNOSIS — G35 Multiple sclerosis: Secondary | ICD-10-CM | POA: Diagnosis not present

## 2016-04-22 NOTE — Progress Notes (Signed)
[facility]  PATIENT: [name] DOB: 05-Oct-1965  REFERRING CLINICIAN: Willey Blade HISTORY FROM: Patient and mother REASON FOR VISIT: Multiple sclerosis   HISTORICAL  CHIEF COMPLAINT:  Chief Complaint  Patient presents with  . Multiple Sclerosis    Sts. she continues to tolerate Tecfidera well.  Sts. she continues PT thru [facility]. She would like to discuss gait/balance training, possible referral to another PT facility/fim    HISTORY OF PRESENT ILLNESS:  [name] is a 50 year old woman with relapsing remitting MS diagnosed in 1997.  She denies any recent exacerbation.   She is on Tecfidera and tolerates it well.  Her last MRI of the brain was 06/08/2015. It did not show any enhancing lesions. An MRI of the cervical spine 06/08/2015 showed several foci within the spinal cord consistent with MS. These images were personally reviewed today.  Gait/strength/sensation:  Gait is stable.   She uses a walker around the house and wheelchair out of the house.    She'll take a few steps without a walker.        The left leg is worse than the right leg and she has left AFO brace . She notes poor balance and she has used a walker x 1 1/2  years (cane x 2 years before that).    Her left foot drags more on some floors.  She denies any weakness in the arms. The left leg also has spasticity and ataxia.   Right leg issues are milder.  We had discussed Ampyra in the past. 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        Bladder:   She feels her bladder is doing well. She has 0 - 1 nocturia.  Vision:  She denies any vision changes.  Fatigue:   She wakes up feeling well but feels fatigued by the evening most days.  She does a little worse with heat.     She takes a nap with benefit many days.  She sleeps well at night.   She does not snore.       .  Mood/cognition:   She denies any depression  or anxiety. However, her mother thinks that she is moody at times. She denies any cognitive dysfunction. She has no difficulty with short-term memory or getting words out.     Her back and radicular left leg pain resolved.     MS History:   In 1997, she presented with backache, poor gait and visual blurring. She had an MRI and a lumbar puncture. The studies were consistent with multiple sclerosis.  She turned down a disease modifying therapy at that time. Her legs and her walking got completely better and she was back at baseline after the steroids. Over the next 15 years or so she had no additional symptoms. She had worsening symptoms in the left leg that was affecting her balance. MRI 2-3 years ago showed progression. She opted not to go on medication at that time.   She went to the [place] clinic at [facility] for several visits. At that time she was started on Tecfidera and she tolerates it well and has not had much flushing or GI symptoms. She has not had definite exacerbations. However, her gait has worsened and she has gone from a cane to a walker.  REVIEW OF SYSTEMS:  Constitutional: No fevers, chills, sweats, or change in appetite Eyes:  No visual changes, double vision, eye pain Ear, nose and throat: No hearing loss, ear pain, nasal congestion, sore throat Cardiovascular: No chest pain, palpitations Respiratory:  No shortness of breath at rest or with exertion.   No wheezes GastrointestinaI: No nausea, vomiting, diarrhea, abdominal pain, fecal incontinence Genitourinary:  No dysuria, urinary retention or frequency.  No nocturia. Musculoskeletal:  No neck pain.   Currently back pain is mild but increases with movements Integumentary: No rash, pruritus, skin lesions Neurological: as above Psychiatric: No major depression at this time -- but is moody.  No anxiety Endocrine: No palpitations, diaphoresis, change in appetite or increased thirst Hematologic/Lymphatic:  No anemia, purpura,  petechiae. Allergic/Immunologic: No itchy/runny eyes, nasal congestion, recent allergic reactions, rashes  ALLERGIES: Allergies  Allergen Reactions  . Food Other (See Comments)    Eyes swelling and throat swelling  . Oxycodone Hcl Hives  . Soy Allergy     Other reaction(s): SWELLING    HOME MEDICATIONS: Outpatient Medications Prior to Visit  Medication Sig Dispense Refill  . [facility] D3 2000 units CAPS TAKE 1 CAPSULE BY MOUTH DAILY OR AS DIRECTED 30 capsule 7  . ibuprofen (ADVIL,MOTRIN) 200 MG tablet Take 200 mg by mouth every 6 (six) hours as needed (pain).    . TECFIDERA 240 MG CPDR TAKE ONE CAPSULE (240 MG) BY MOUTH TWICE DAILY 180 capsule 3  . vitamin B-12 (CYANOCOBALAMIN) 500 MCG tablet Take 2 tablets (1,000 mcg total) by mouth daily. 180 tablet 3  . terbinafine (LAMISIL) 250 MG tablet Take 250 mg by mouth daily.  0  . diphenhydrAMINE (BENADRYL) 25 MG tablet Take 2 tablets (50 mg total) by mouth every 6 (six) hours. 60 tablet 0   Facility-Administered Medications Prior to Visit  Medication Dose Route Frequency Provider Last Rate Last Dose  . gadopentetate dimeglumine (MAGNEVIST) injection 20 mL  20 mL Intravenous Once PRN Britt Bottom, MD        PAST MEDICAL HISTORY: Past Medical History:  Diagnosis Date  . Movement disorder   . Multiple sclerosis ([place])     PAST SURGICAL HISTORY: Past Surgical History:  Procedure Laterality Date  . CESAREAN SECTION    . UTERINE FIBROID SURGERY    . WISDOM TOOTH EXTRACTION      FAMILY HISTORY: Family History  Problem Relation Age of Onset  . Hypertension Mother   . Hypertension Father   . Diabetes Father     SOCIAL HISTORY:  Social History   Social History  . Marital status: Married    Spouse name: N/A  . Number of children: N/A  . Years of education: N/A   Occupational History  . Not on file.   Social History Main Topics  . Smoking status: Never Smoker  . Smokeless tobacco: Never Used  . Alcohol use No  . Drug  use: No  . Sexual activity: Not on file   Other Topics Concern  . Not on file   Social History Narrative  . No narrative on file     PHYSICAL EXAM  Vitals:   04/22/16 1500  BP: 110/68  Pulse: 70  Resp: 18  Weight: 247 lb (112 kg)  Height: 5\' 7"  (1.702 m)    Body mass index is 38.69 kg/m.   General: The patient is well-developed and well-nourished and in no acute distress.   She uses a walker   Neurologic Exam  Mental status: The patient is alert and oriented x 3 at the time of the examination. The  patient has apparent normal recent and remote memory, with an apparently normal attention span and concentration ability.   Speech is normal.  Cranial nerves: Extraocular movements show bilateral INO's with nystagmus.  Facial symmetry is present. There is good facial sensation to soft touch bilaterally.Facial strength is normal.  Trapezius and sternocleidomastoid strength is normal. No dysarthria is noted.  The tongue is midline, and the patient has symmetric elevation of the soft palate. No obvious hearing deficits are noted.  Motor:  Muscle bulk is normal.   Tone is increased in left > right legs;normal in arms.  Strength is 4 in right iliopsoas and right EHL, 5/5 elsewhere in right leg and both arms.   Strength is  4-/5 in left EHL and ant. tib 4+/5 quads and 3/5 in left iliopsoas.      Sensory: Sensory testing is intact to touch and vibration sensation in arms.   Mild reduced Vibration sense in left foot  Coordination: Cerebellar testing reveals good finger-nose-finger and right heel-to-shin but very poor left HTS.  Gait and station: Station is unstable and she does better with unilateral support.   Positive Romberg,   . Wide stride with left foot drop and needs unilateral support for balance.    Reflexes: Deep tendon reflexes are increased on let more than right legs.     DIAGNOSTIC DATA (LABS, IMAGING, TESTING) - I reviewed patient records, labs, notes, testing and  imaging myself where available.  Lab Results  Component Value Date   WBC 6.1 10/23/2015   HGB 14.2 09/28/2014   HCT 41.1 10/23/2015   MCV 90 10/23/2015   PLT 277 10/23/2015      ASSESSMENT AND PLAN  Multiple sclerosis ([facility])  Gait disturbance  Left foot drop  Other fatigue  Avitaminosis D  Urinary frequency   1.   Continue Tecfidera and check CBC to assess for lymphopenia.   Around the time of next visit we will check an MRI of the brain to make sure that there is not any subclinical progression that will make [place] consider a change to different disease modifying therapy. 2.   Continue Vit D and B12 as she was deficient in past.   Last vitamin D was fine so OTC strength is sufficient 3.   Physical therapy for gait and left greater than right weakness/spasticity. Stay active and exercise as tolerated.   Use walker for safety.    4.    rtc 6 months, sooner if problems     Richard A. Felecia Shelling, MD, PhD [id number], [id number] PM Certified in Neurology, Clinical Neurophysiology, Sleep Medicine, Pain Medicine and Neuroimaging  [facility] [address], [place], [place] [address] [phone]    g \\

## 2016-04-23 ENCOUNTER — Telehealth: Payer: Self-pay | Admitting: *Deleted

## 2016-04-23 LAB — CBC WITH DIFFERENTIAL/PLATELET
Basophils Absolute: 0 x10E3/uL (ref 0.0–0.2)
Basos: 0 %
EOS (ABSOLUTE): 0.2 x10E3/uL (ref 0.0–0.4)
Eos: 3 %
Hematocrit: 40.6 % (ref 34.0–46.6)
Hemoglobin: 13.8 g/dL (ref 11.1–15.9)
Immature Grans (Abs): 0.1 x10E3/uL (ref 0.0–0.1)
Immature Granulocytes: 1 %
Lymphocytes Absolute: 1.5 x10E3/uL (ref 0.7–3.1)
Lymphs: 18 %
MCH: 30.7 pg (ref 26.6–33.0)
MCHC: 34 g/dL (ref 31.5–35.7)
MCV: 90 fL (ref 79–97)
Monocytes Absolute: 0.8 x10E3/uL (ref 0.1–0.9)
Monocytes: 9 %
Neutrophils Absolute: 6 x10E3/uL (ref 1.4–7.0)
Neutrophils: 69 %
Platelets: 295 x10E3/uL (ref 150–379)
RBC: 4.5 x10E6/uL (ref 3.77–5.28)
RDW: 14.5 % (ref 12.3–15.4)
WBC: 8.6 x10E3/uL (ref 3.4–10.8)

## 2016-04-23 NOTE — Telephone Encounter (Signed)
-----   Message from Britt Bottom, MD sent at 04/23/2016  8:50 AM EDT ----- Please note that the blood count is good.

## 2016-04-23 NOTE — Telephone Encounter (Signed)
I have spoken with [name] this afternoon, and per RAS, advised that blood counts are good--she should continue Tecfidera as rx'd.  She verbalized understanding of same/fim

## 2016-09-26 DIAGNOSIS — R531 Weakness: Secondary | ICD-10-CM | POA: Diagnosis not present

## 2016-09-26 DIAGNOSIS — R262 Difficulty in walking, not elsewhere classified: Secondary | ICD-10-CM | POA: Diagnosis not present

## 2016-10-10 DIAGNOSIS — R531 Weakness: Secondary | ICD-10-CM | POA: Diagnosis not present

## 2016-10-10 DIAGNOSIS — R262 Difficulty in walking, not elsewhere classified: Secondary | ICD-10-CM | POA: Diagnosis not present

## 2016-10-15 DIAGNOSIS — R531 Weakness: Secondary | ICD-10-CM | POA: Diagnosis not present

## 2016-10-15 DIAGNOSIS — R262 Difficulty in walking, not elsewhere classified: Secondary | ICD-10-CM | POA: Diagnosis not present

## 2016-10-21 ENCOUNTER — Ambulatory Visit (INDEPENDENT_AMBULATORY_CARE_PROVIDER_SITE_OTHER): Payer: BLUE CROSS/BLUE SHIELD | Admitting: Neurology

## 2016-10-21 ENCOUNTER — Encounter: Payer: Self-pay | Admitting: Neurology

## 2016-10-21 VITALS — BP 104/66 | HR 70 | Resp 18 | Ht 67.0 in

## 2016-10-21 DIAGNOSIS — M21372 Foot drop, left foot: Secondary | ICD-10-CM | POA: Diagnosis not present

## 2016-10-21 DIAGNOSIS — G35 Multiple sclerosis: Secondary | ICD-10-CM | POA: Diagnosis not present

## 2016-10-21 DIAGNOSIS — R269 Unspecified abnormalities of gait and mobility: Secondary | ICD-10-CM

## 2016-10-21 DIAGNOSIS — Z79899 Other long term (current) drug therapy: Secondary | ICD-10-CM | POA: Diagnosis not present

## 2016-10-21 DIAGNOSIS — R350 Frequency of micturition: Secondary | ICD-10-CM | POA: Diagnosis not present

## 2016-10-21 DIAGNOSIS — R5383 Other fatigue: Secondary | ICD-10-CM

## 2016-10-21 NOTE — Progress Notes (Signed)
[facility]  PATIENT: [name] DOB: 08-01-1966  REFERRING CLINICIAN: Willey Blade HISTORY FROM: Patient and mother REASON FOR VISIT: Multiple sclerosis   HISTORICAL  CHIEF COMPLAINT:  Chief Complaint  Patient presents with  . Multiple Sclerosis    Sts. she continues to tolerate Tecfidera well.  Thinks spasticity is some worse in her hands, but sts. exercises help./fim    HISTORY OF PRESENT ILLNESS:  [name] is a 51 year old woman with relapsing remitting MS diagnosed in 1997.    MS:    She is on Tecfidera and she tolerates it well.   She denies any recent exacerbation..   Her last MRI of the brain was 06/08/2015. It did not show any enhancing lesions. An MRI of the cervical spine 06/08/2015 showed several foci within the spinal cord consistent with MS. These images were personally reviewed today.  Gait/strength/sensation:  Gait is stable.   She uses a walker around the house and can get from one room to another.    She'll take a few steps without a walker.        The left leg is worse than the right leg and she has left AFO brace but does not wear it much .   She has been using a walker predominantly around the house for the past 2 years.    Her left foot drags more on some floors.  She denies any weakness in the arms. He r left leg is spastic.   She notes left leg ataxia.   Right leg issues are milder.  We had discussed Ampyra in the past. 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        Bladder:   She feels her bladder is doing well. She has 0 - 1 nocturia.   She does not need any medication.    Vision:  She denies any MS related vision changes.  Fatigue:   She feels fatigue is better.    She wakes up feeling well but feels fatigued by the evening most days.  She has mild sleepiness.   She takes a nap with benefit many days.  She sleeps well at night.   She does not snore.       .   Mood/cognition:   She denies any depression or anxiety.   She denies any cognitive dysfunction. She has no difficulty with short-term memory or getting words out.     LBP/leg pain:   Her back and radicular left leg pain resolved.    Pain increases with bending.   NSAIDs help.   MS History:   In 1997, she presented with backache, poor gait and visual blurring. She had an MRI and a lumbar puncture. The studies were consistent with multiple sclerosis.  She turned down a disease modifying therapy at that time. Her legs and her walking got completely better and she was back at baseline after the steroids. Over the next 15 years or so she had no additional symptoms. She had worsening symptoms in the left leg that was affecting her balance. MRI 2-3 years ago showed progression. She opted not to go on medication at that time.   She went to the [place] [facility] at [facility] for several visits. At that time she was started on Tecfidera and she tolerates it well and has not had much flushing or GI symptoms. She has not had definite exacerbations. However,  her gait has worsened and she has gone from a cane to a walker.  REVIEW OF SYSTEMS:  Constitutional: No fevers, chills, sweats, or change in appetite Eyes: No visual changes, double vision, eye pain Ear, nose and throat: No hearing loss, ear pain, nasal congestion, sore throat Cardiovascular: No chest pain, palpitations Respiratory:  No shortness of breath at rest or with exertion.   No wheezes GastrointestinaI: No nausea, vomiting, diarrhea, abdominal pain, fecal incontinence Genitourinary:  No dysuria, urinary retention or frequency.  No nocturia. Musculoskeletal:  No neck pain.   Currently back pain is mild but increases with movements Integumentary: No rash, pruritus, skin lesions Neurological: as above Psychiatric: No major depression at this time -- but is moody.  No anxiety Endocrine: No palpitations, diaphoresis, change in appetite or increased  thirst Hematologic/Lymphatic:  No anemia, purpura, petechiae. Allergic/Immunologic: No itchy/runny eyes, nasal congestion, recent allergic reactions, rashes  ALLERGIES: Allergies  Allergen Reactions  . Food Other (See Comments)    Eyes swelling and throat swelling  . Oxycodone Hcl Hives  . Soy Allergy     Other reaction(s): SWELLING    HOME MEDICATIONS: Outpatient Medications Prior to Visit  Medication Sig Dispense Refill  . CVS D3 2000 units CAPS TAKE 1 CAPSULE BY MOUTH DAILY OR AS DIRECTED 30 capsule 7  . ibuprofen (ADVIL,MOTRIN) 200 MG tablet Take 200 mg by mouth every 6 (six) hours as needed (pain).    . TECFIDERA 240 MG CPDR TAKE ONE CAPSULE (240 MG) BY MOUTH TWICE DAILY 180 capsule 3  . vitamin B-12 (CYANOCOBALAMIN) 500 MCG tablet Take 2 tablets (1,000 mcg total) by mouth daily. 180 tablet 3  . diphenhydrAMINE (BENADRYL) 25 MG tablet Take 2 tablets (50 mg total) by mouth every 6 (six) hours. 60 tablet 0   Facility-Administered Medications Prior to Visit  Medication Dose Route Frequency Provider Last Rate Last Dose  . gadopentetate dimeglumine (MAGNEVIST) injection 20 mL  20 mL Intravenous Once PRN Britt Bottom, MD        PAST MEDICAL HISTORY: Past Medical History:  Diagnosis Date  . Movement disorder   . Multiple sclerosis ([place])     PAST SURGICAL HISTORY: Past Surgical History:  Procedure Laterality Date  . CESAREAN SECTION    . UTERINE FIBROID SURGERY    . WISDOM TOOTH EXTRACTION      FAMILY HISTORY: Family History  Problem Relation Age of Onset  . Hypertension Mother   . Hypertension Father   . Diabetes Father     SOCIAL HISTORY:  Social History   Social History  . Marital status: Married    Spouse name: N/A  . Number of children: N/A  . Years of education: N/A   Occupational History  . Not on file.   Social History Main Topics  . Smoking status: Never Smoker  . Smokeless tobacco: Never Used  . Alcohol use No  . Drug use: No  . Sexual  activity: Not on file   Other Topics Concern  . Not on file   Social History Narrative  . No narrative on file     PHYSICAL EXAM  Vitals:   10/21/16 0929  BP: 104/66  Pulse: 70  Resp: 18  Height: 5\' 7"  (1.702 m)    There is no height or weight on file to calculate BMI.   General: The patient is well-developed and well-nourished and in no acute distress.   She uses a walker   Neurologic Exam  Mental status: The patient  is alert and oriented x 3 at the time of the examination. The patient has apparent normal recent and remote memory, with an apparently normal attention span and concentration ability.   Speech is normal.  Cranial nerves: Extraocular movements showed that she has bilateral INO's with nystagmus.  Facial symmetry is present. Facial strength and sensation is normal. Trapezius and sternocleidomastoid strength is normal. No dysarthria is noted.  The tongue is midline, and the patient has symmetric elevation of the soft palate. No obvious hearing deficits are noted.  Motor:  Muscle bulk is normal.   Muscle tone is increased in legs,  left > right .  Tone is normal in arms.   Strength is 4 in right iliopsoas and right EHL, 5/5 elsewhere in right leg and both arms.   Strength is  4-/5 in left EHL and ant. tib 4+/5 quads and 3/5 in left iliopsoas.      Sensory: Sensory testing is intact to touch and vibration sensation in arms.   Mild reduced Vibration sense in left foot  Coordination: Cerebellar testing reveals good finger-nose-finger and right heel-to-shin but very poor left HTS.  Gait and station: Station is unstable and she does better with unilateral support.   Positive Romberg,   . Wide stride with left foot drop and needs unilateral support for balance.    Reflexes: Deep tendon reflexes are increased on let more than right legs.     DIAGNOSTIC DATA (LABS, IMAGING, TESTING) - I reviewed patient records, labs, notes, testing and imaging myself where  available.  Lab Results  Component Value Date   WBC 8.6 04/22/2016   HGB 14.2 09/28/2014   HCT 40.6 04/22/2016   MCV 90 04/22/2016   PLT 295 04/22/2016      ASSESSMENT AND PLAN  Multiple sclerosis ([place]) - Plan: CBC with Differential/Platelet, Comprehensive metabolic panel, Stratify JCV Antibody Test ([facility])  Gait disturbance  Left foot drop  Other fatigue  Urinary frequency  High risk medication use - Plan: Comprehensive metabolic panel, Stratify JCV Antibody Test ([facility])     1.   Continue Tecfidera and check CBC to assess for lymphopenia and CMP to rule out hepatotoxicity and other changes..   Later this year we will check an MRI of the brain to make sure that there is not any subclinical progression that will make [place] consider a change to different disease modifying therapy. 2.   Continue supplementation with vitamin D and vitamin B12. 3.   Stay active and make sure to walk daily with her walker. 4.    rtc 6 months, sooner if problems     Ouita Nish A. Felecia Shelling, MD, PhD [id number], [id number] AM Certified in Neurology, Clinical Neurophysiology, Sleep Medicine, Pain Medicine and Neuroimaging  [facility] [address], [place], [place] [address] [phone]    g Amado Nash

## 2016-10-22 LAB — COMPREHENSIVE METABOLIC PANEL
A/G RATIO: 1.6 (ref 1.2–2.2)
ALBUMIN: 4.9 g/dL (ref 3.5–5.5)
ALT: 17 IU/L (ref 0–32)
AST: 12 IU/L (ref 0–40)
Alkaline Phosphatase: 64 IU/L (ref 39–117)
BUN/Creatinine Ratio: 21 (ref 9–23)
BUN: 16 mg/dL (ref 6–24)
Bilirubin Total: 0.3 mg/dL (ref 0.0–1.2)
CALCIUM: 9.5 mg/dL (ref 8.7–10.2)
CO2: 24 mmol/L (ref 18–29)
Chloride: 99 mmol/L (ref 96–106)
Creatinine, Ser: 0.76 mg/dL (ref 0.57–1.00)
GFR calc Af Amer: 106 mL/min/{1.73_m2} (ref >59)
GFR, EST NON AFRICAN AMERICAN: 92 mL/min/{1.73_m2} (ref >59)
GLOBULIN, TOTAL: 3 g/dL (ref 1.5–4.5)
Glucose: 97 mg/dL (ref 65–99)
Potassium: 4.4 mmol/L (ref 3.5–5.2)
SODIUM: 138 mmol/L (ref 134–144)
TOTAL PROTEIN: 7.9 g/dL (ref 6.0–8.5)

## 2016-10-22 LAB — CBC WITH DIFFERENTIAL/PLATELET
Basophils Absolute: 0 10*3/uL (ref 0.0–0.2)
Basos: 0 %
EOS (ABSOLUTE): 0.1 10*3/uL (ref 0.0–0.4)
EOS: 1 %
HEMATOCRIT: 43.2 % (ref 34.0–46.6)
Hemoglobin: 14.6 g/dL (ref 11.1–15.9)
IMMATURE GRANS (ABS): 0 10*3/uL (ref 0.0–0.1)
Immature Granulocytes: 0 %
Lymphocytes Absolute: 1.5 10*3/uL (ref 0.7–3.1)
Lymphs: 18 %
MCH: 29.7 pg (ref 26.6–33.0)
MCHC: 33.8 g/dL (ref 31.5–35.7)
MCV: 88 fL (ref 79–97)
Monocytes Absolute: 0.5 10*3/uL (ref 0.1–0.9)
Monocytes: 6 %
NEUTROS ABS: 6.5 10*3/uL (ref 1.4–7.0)
NEUTROS PCT: 75 %
Platelets: 278 10*3/uL (ref 150–379)
RBC: 4.91 x10E6/uL (ref 3.77–5.28)
RDW: 14.6 % (ref 12.3–15.4)
WBC: 8.7 10*3/uL (ref 3.4–10.8)

## 2016-10-23 DIAGNOSIS — R262 Difficulty in walking, not elsewhere classified: Secondary | ICD-10-CM | POA: Diagnosis not present

## 2016-10-23 DIAGNOSIS — R531 Weakness: Secondary | ICD-10-CM | POA: Diagnosis not present

## 2016-10-29 ENCOUNTER — Telehealth: Payer: Self-pay | Admitting: Neurology

## 2016-10-29 DIAGNOSIS — G35 Multiple sclerosis: Secondary | ICD-10-CM

## 2016-10-29 DIAGNOSIS — G35D Multiple sclerosis, unspecified: Secondary | ICD-10-CM

## 2016-10-29 DIAGNOSIS — R269 Unspecified abnormalities of gait and mobility: Secondary | ICD-10-CM

## 2016-10-29 NOTE — Telephone Encounter (Signed)
Rx. for w/c faxed to [facility] per pt's request/fim

## 2016-10-29 NOTE — Telephone Encounter (Signed)
Rx. for w/c faxed to [facility], fax# [phone]/fim

## 2016-10-29 NOTE — Telephone Encounter (Signed)
I have spoken with [name] this morning--extended, pleasant conversation regarding what type of w/c she needs.  We discussed power w/c vs manual w/c, insurance coverage (1w/c every 5 yrs., so if she gets a manual w/c now, and needs a power w/c in 2 yrs., she may not be able to get that covered thru insurance, if her insurance remains the same.)  She sts. she prefers to stay as mobile as possible right now, so just wants rx. for manual w/c faxed to [facility]./fim

## 2016-10-29 NOTE — Telephone Encounter (Signed)
Patient called office to see if she is able to get an order for a wheel chair (powered/normal) if possible to compare prices.  Patient will be using [facility] fax # [phone]

## 2016-10-29 NOTE — Telephone Encounter (Signed)
Rx. for lightweight manual w/c with seat cushion awaiting RAS sig/fim

## 2016-10-29 NOTE — Addendum Note (Signed)
Addended by: France Ravens I on: 10/29/2016 11:32 AM   Modules accepted: Orders

## 2016-10-30 ENCOUNTER — Telehealth: Payer: Self-pay | Admitting: *Deleted

## 2016-10-30 ENCOUNTER — Encounter: Payer: Self-pay | Admitting: *Deleted

## 2016-10-30 DIAGNOSIS — N83202 Unspecified ovarian cyst, left side: Secondary | ICD-10-CM | POA: Diagnosis not present

## 2016-10-30 DIAGNOSIS — D259 Leiomyoma of uterus, unspecified: Secondary | ICD-10-CM | POA: Diagnosis not present

## 2016-10-30 NOTE — Telephone Encounter (Signed)
-----   Message from Britt Bottom, MD sent at 10/30/2016  1:07 PM EST ----- Please let him know that the Labs are fine

## 2016-10-30 NOTE — Telephone Encounter (Signed)
I have spoken with [name] this afternoon and per RAS, explained that labs done in our office look ok.  She verbalized understanding of same/fim

## 2016-11-03 ENCOUNTER — Telehealth: Payer: Self-pay | Admitting: *Deleted

## 2016-11-03 NOTE — Telephone Encounter (Signed)
[facility] [phone] called to speak with RN reg wheelchair order

## 2016-11-03 NOTE — Telephone Encounter (Signed)
I have spoken with [name] this afternoon, explained that I have spoken with [name] at [facility].  She explained that pt. does not qualify for a lightwt. w/c.  Sts. a lightwt. w/c is only 2lbs lighter than a standard w/c.  She will change the order and fax to me for sig.  I have also faxed last ov note that details pt. difficulty walking.  Now waiting to see if [facility] can process order.  She verbalized understanding of same/fim

## 2016-11-03 NOTE — Telephone Encounter (Signed)
Patient called stating she spoke with [facility] today re: trying to get a wheelchair through Medicaid. She stated they "did not win the bid for this area". They are unable to assist her with getting a wheelchair. Please call her to discuss other options.

## 2016-11-03 NOTE — Telephone Encounter (Signed)
I have spoken with [name] this afternoon.  She clarified that pt. will not qualify for a lightwt. w/c, would have to get a standard w/c.  She explained that a lightwt. w/c is only 2 lbs lighter than a standard manual w/c.  She will fax me a new order for RAS to sign. I have faxed requested ov notes to her/fim

## 2016-11-06 DIAGNOSIS — R531 Weakness: Secondary | ICD-10-CM | POA: Diagnosis not present

## 2016-11-06 DIAGNOSIS — R262 Difficulty in walking, not elsewhere classified: Secondary | ICD-10-CM | POA: Diagnosis not present

## 2016-11-14 NOTE — Telephone Encounter (Signed)
[name] with [facility] is calling to discuss order for wheelchair for the patient.

## 2016-11-14 NOTE — Telephone Encounter (Signed)
I have spoken with [name] at [facility].  She sts. ins. will not approve a manual w/c for pt. b/c pt. is able to walk with walker at home.  Sts. transport w/c, which weighs about 20lbs, is available for purchase at [facility] for around $80.  I have spoken with Catricia, and offered the following options: 1) another office visit to document worsening gait and need for manual w/c (she does not want a power w/c yet--wants to keep as mobile as possible, and is afraid if she has a power w/c, walking will decline). 2) I can refer her to the [facility] to see if they can help with the cost of a manual transport w/c, or 3) she can purchase on her own if she is able.  She sts. if cost is only arounc $80, she feels she can afford this.  She will let me know if she changes her mind/fim

## 2016-11-19 ENCOUNTER — Other Ambulatory Visit: Payer: Self-pay | Admitting: Neurology

## 2017-01-26 ENCOUNTER — Other Ambulatory Visit: Payer: Self-pay | Admitting: Neurology

## 2017-02-03 IMAGING — US US RENAL
1 series · 14 of 23 positions shown · non-contrast
Comparison: None.

CLINICAL DATA: 49-year-old female with a history of hematuria

EXAM:
RENAL / URINARY TRACT ULTRASOUND COMPLETE

[Series 1: us renal · 0.30mm/px · 14 of 23 slices shown]
[im 1/23]
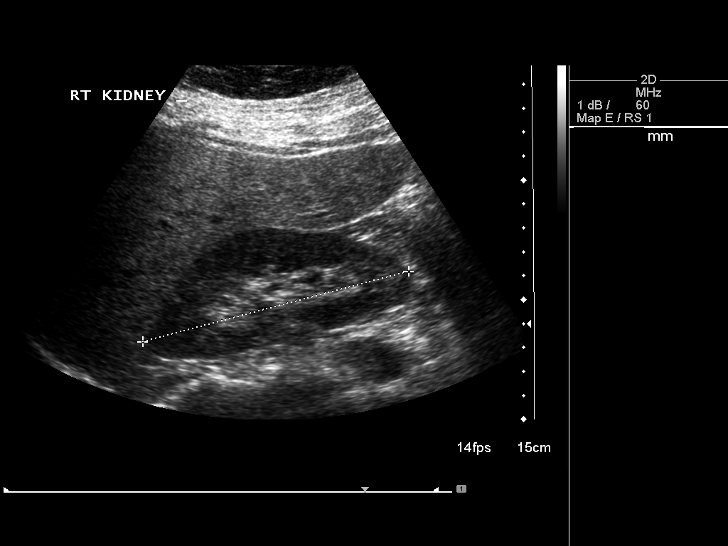
[im 3/23]
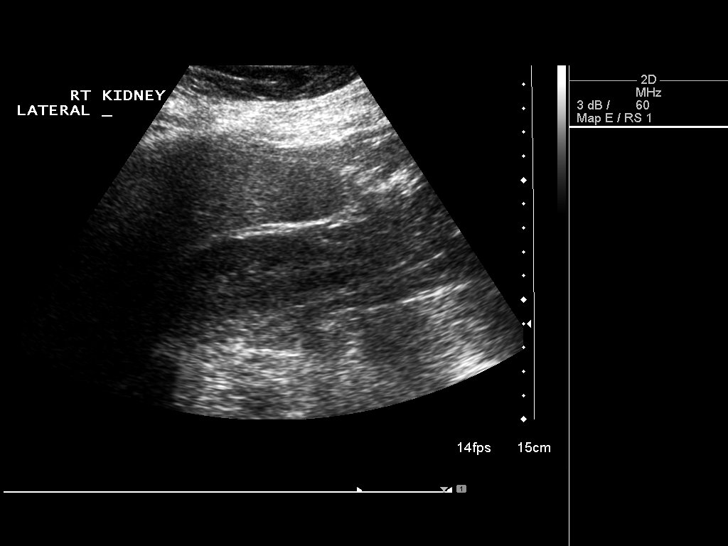
[im 5/23]
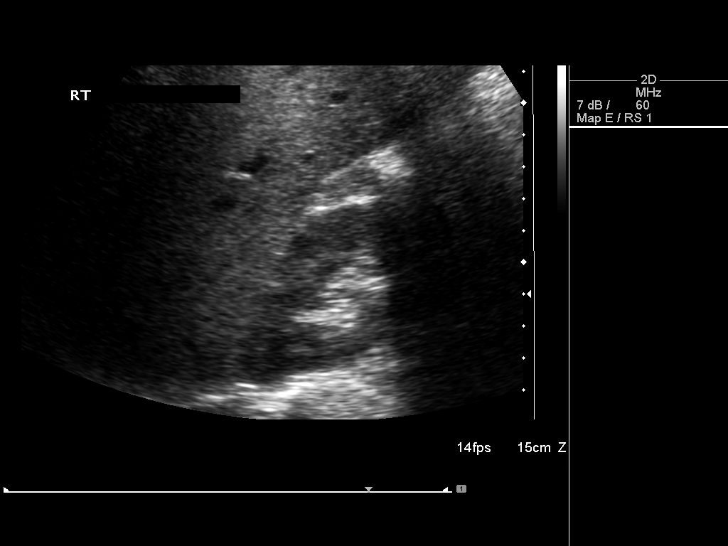
[im 6/23]
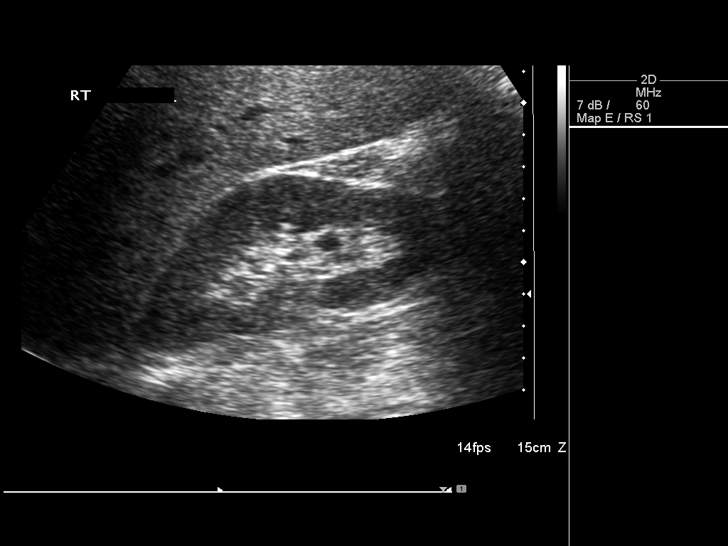
[im 8/23]
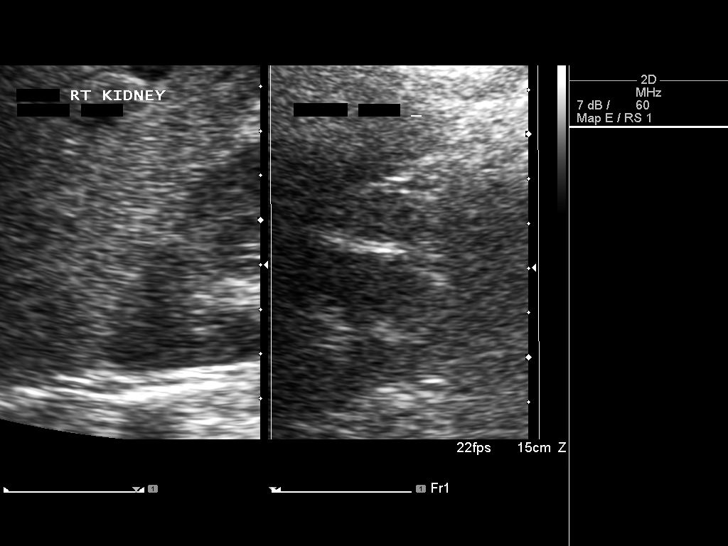
[im 10/23]
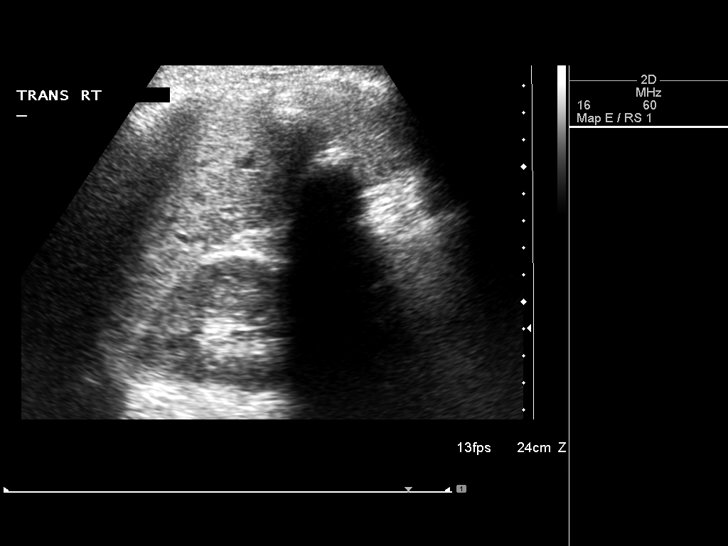
[im 11/23]
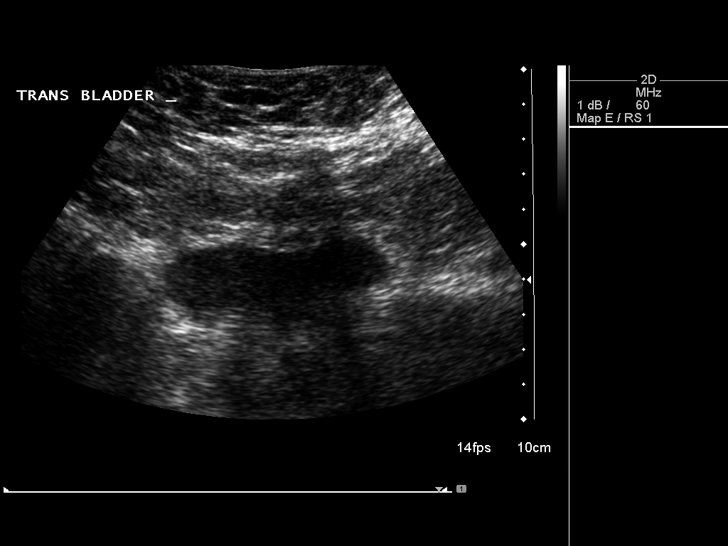
[im 13/23]
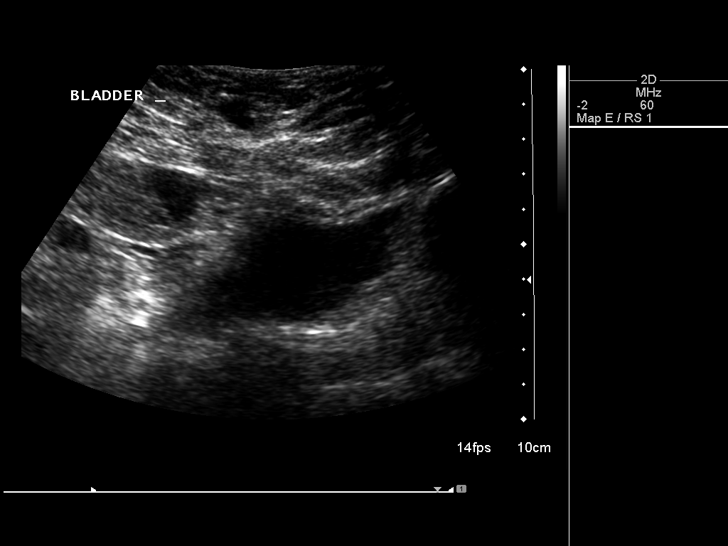
[im 14/23]
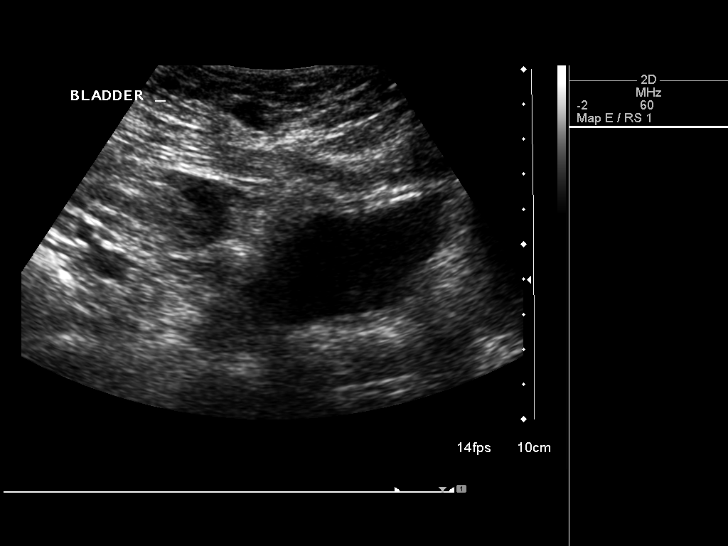
[im 16/23]
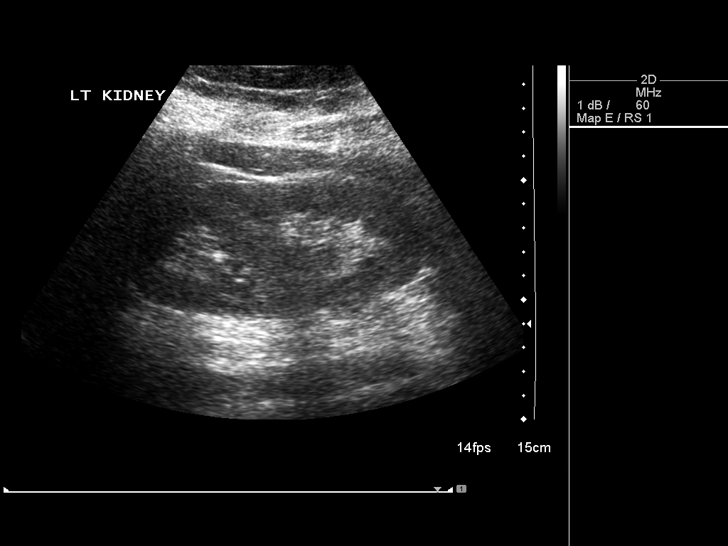
[im 18/23]
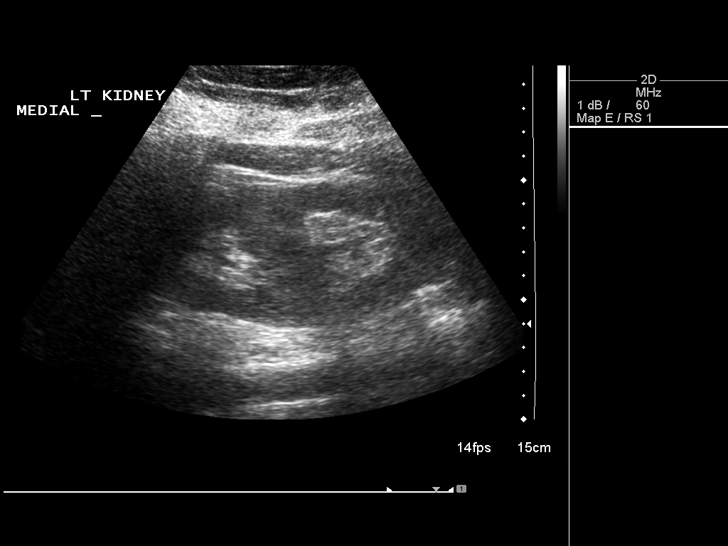
[im 19/23]
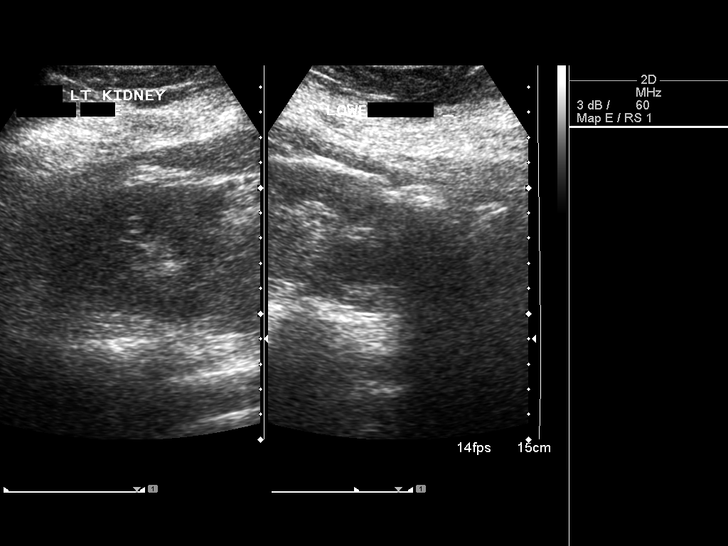
[im 21/23]
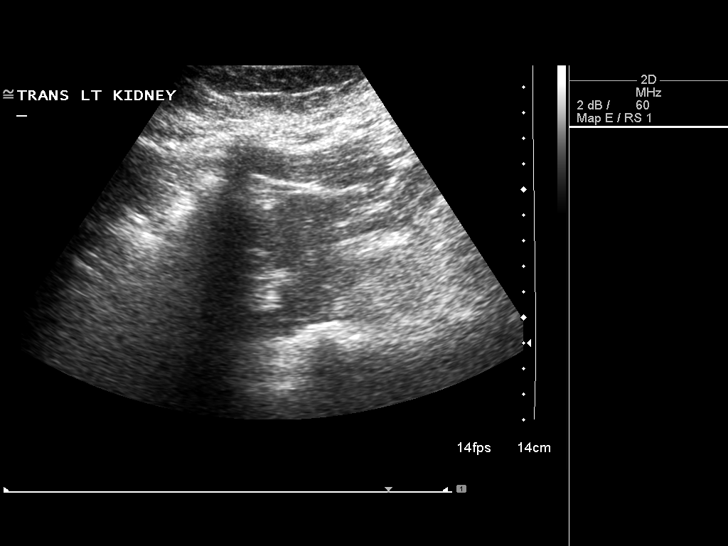
[im 23/23]
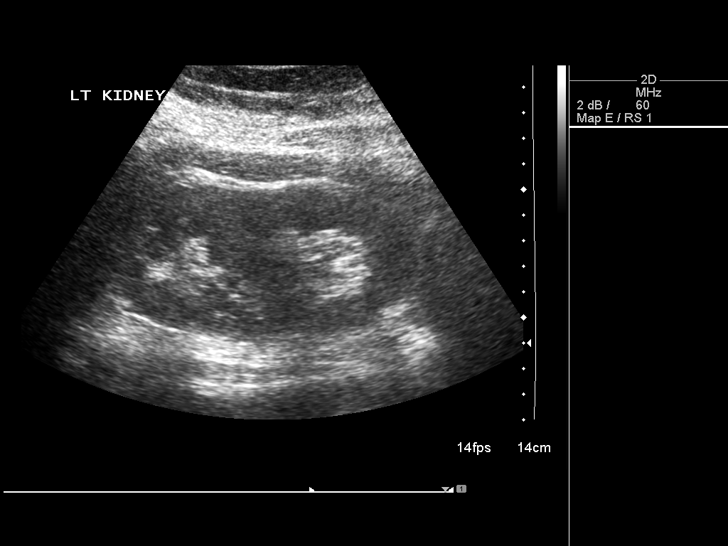

[14 of 23 positions shown; findings below may reference images not displayed]

FINDINGS: Right Kidney:

Length: 11.5 cm. Echogenicity less than that of the adjacent liver.
No hydronephrosis.

Left Kidney:

Length: 12.6 cm. Echogenicity similar to that of the adjacent
spleen. No hydronephrosis.

Bladder:

Appears normal for degree of bladder distention.
IMPRESSION: Unremarkable sonographic survey of the kidneys.

## 2017-02-12 ENCOUNTER — Encounter: Payer: Self-pay | Admitting: *Deleted

## 2017-02-12 ENCOUNTER — Telehealth: Payer: Self-pay | Admitting: Neurology

## 2017-02-12 NOTE — Telephone Encounter (Signed)
Letter mailed to home address/fim

## 2017-02-12 NOTE — Telephone Encounter (Signed)
I have spoken with [name] this afternoon.  She sts. she doesn't feel she can serve for jury duty as spasticity/spasms are worse with prolonged sitting.  Letter awaiting RAS sig/fim

## 2017-02-12 NOTE — Telephone Encounter (Signed)
Patient called office in reference to [place] for August 2nd and is needing letter stating why patient is unable to serve and her disability and how it effects the patient.  Please call and if able to complete letter per patient please mail to her home.

## 2017-02-19 ENCOUNTER — Telehealth: Payer: Self-pay | Admitting: Neurology

## 2017-02-19 NOTE — Telephone Encounter (Signed)
Patient called and requested to speak with the nurse regarding a vitamin she is wanting to take. She wants to know if this would be ok, the vitamin is TURMERIC. Please call and advise.

## 2017-02-19 NOTE — Telephone Encounter (Signed)
I have spoken with Dellene and advised ok to take otc Turmeric.  She verbalized understanding of same, sts. has been taking it and feels it is helping edema in legs/fim

## 2017-02-25 DIAGNOSIS — M79675 Pain in left toe(s): Secondary | ICD-10-CM | POA: Diagnosis not present

## 2017-02-25 DIAGNOSIS — M79674 Pain in right toe(s): Secondary | ICD-10-CM | POA: Diagnosis not present

## 2017-02-27 ENCOUNTER — Other Ambulatory Visit: Payer: Self-pay | Admitting: Neurology

## 2017-04-23 ENCOUNTER — Ambulatory Visit (INDEPENDENT_AMBULATORY_CARE_PROVIDER_SITE_OTHER): Payer: BLUE CROSS/BLUE SHIELD | Admitting: Neurology

## 2017-04-23 ENCOUNTER — Encounter: Payer: Self-pay | Admitting: Neurology

## 2017-04-23 VITALS — BP 135/78 | HR 98 | Wt 250.0 lb

## 2017-04-23 DIAGNOSIS — M21372 Foot drop, left foot: Secondary | ICD-10-CM

## 2017-04-23 DIAGNOSIS — R350 Frequency of micturition: Secondary | ICD-10-CM | POA: Diagnosis not present

## 2017-04-23 DIAGNOSIS — R269 Unspecified abnormalities of gait and mobility: Secondary | ICD-10-CM

## 2017-04-23 DIAGNOSIS — G35 Multiple sclerosis: Secondary | ICD-10-CM

## 2017-04-23 DIAGNOSIS — R5383 Other fatigue: Secondary | ICD-10-CM | POA: Diagnosis not present

## 2017-04-23 DIAGNOSIS — Z79899 Other long term (current) drug therapy: Secondary | ICD-10-CM | POA: Diagnosis not present

## 2017-04-23 DIAGNOSIS — G35D Multiple sclerosis, unspecified: Secondary | ICD-10-CM

## 2017-04-23 NOTE — Progress Notes (Signed)
[facility]  PATIENT: [name] DOB: [birth year]  REFERRING CLINICIAN: Willey Blade HISTORY FROM: Patient and mother REASON FOR VISIT: Multiple sclerosis   HISTORICAL  CHIEF COMPLAINT:  No chief complaint on file.   HISTORY OF PRESENT ILLNESS:  [name] is a 51 year old woman with relapsing remitting MS diagnosed in 1997.    MS:    She is on Tecfidera and she tolerates it well.  No flushing or GI issues.   She denies any recent exacerbation..   Her last MRI of the brain was 06/08/2015. It did not show any enhancing lesions. An MRI of the cervical spine 06/08/2015 showed several foci within the spinal cord consistent with MS. These images were personally reviewed today.  Gait/strength/sensation:  Her gait is a little worse compared to her last visit.    she uses a walker around the house and wheelchair for long distances outside.   The left leg is worse than the right leg and she has left AFO brace for foot drop but does not use it recently.   She denies any weakness in the arms. He r left leg is spastic.   She notes left leg ataxia.   Right leg issues are milder.  We had discussed Ampyra in the past. 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        Bladder:   She feels her bladder is better with only mild frequency now.   She does not need any medication.    Vision:  She denies any MS related vision changes.  Fatigue:   She notes mild fatigue most days. The fatigue worsens as the day goes on. She is often sleepy and takes naps most days. She sleeps well at night.   She does not snore.       .  Mood/cognition:  She feels mood is doing well though she is sometimes irritable. She denies any cognitive dysfunction. There is no difficulty with verbal fluency or short-term memory.  LBP/leg pain:   Her back and radicular left leg pain resolved.    Pain increases with bending.   NSAIDs help.      A chiropractor told her one leg is shorter than another and she feels manipulations are helping some.    MS History:   In 1997, she presented with backache, poor gait and visual blurring. She had an MRI and a lumbar puncture. The studies were consistent with multiple sclerosis.  She turned down a disease modifying therapy at that time. Her legs and her walking got completely better and she was back at baseline after the steroids. Over the next 15 years or so she had no additional symptoms. She had worsening symptoms in the left leg that was affecting her balance. MRI 2-3 years ago showed progression. She opted not to go on medication at that time.   She went to the [place] [facility] at [facility] for several visits. At that time she was started on Tecfidera and she tolerates it well and has not had much flushing or GI symptoms. She has not had definite exacerbations. However, her gait has worsened and she has gone from a cane to a walker.  REVIEW OF SYSTEMS:  Constitutional: No fevers, chills, sweats, or change in appetite Eyes: No visual changes, double vision, eye pain Ear, nose and throat: No hearing loss, ear pain, nasal congestion, sore throat Cardiovascular: No chest pain,  palpitations Respiratory:  No shortness of breath at rest or with exertion.   No wheezes GastrointestinaI: No nausea, vomiting, diarrhea, abdominal pain, fecal incontinence Genitourinary:  No dysuria, urinary retention or frequency.  No nocturia. Musculoskeletal:  No neck pain.   She reports some back pain and occasionally proximal leg pain. Integumentary: No rash, pruritus, skin lesions Neurological: as above Psychiatric: She denies depression but notes that she is sometimes moody. There is no anxiety. Endocrine: No palpitations, diaphoresis, change in appetite or increased thirst Hematologic/Lymphatic:  No anemia, purpura, petechiae. Allergic/Immunologic: No itchy/runny eyes, nasal congestion, recent allergic reactions,  rashes  ALLERGIES: Allergies  Allergen Reactions  . Food Other (See Comments)    Eyes swelling and throat swelling  . Oxycodone Hcl Hives  . Soy Allergy     Other reaction(s): SWELLING    HOME MEDICATIONS: Outpatient Medications Prior to Visit  Medication Sig Dispense Refill  . CVS D3 2000 units CAPS TAKE 1 CAPSULE BY MOUTH DAILY OR AS DIRECTED 30 capsule 7  . CVS VITAMIN B-12 500 MCG tablet TAKE 2 TABLETS BY MOUTH EVERY DAY 180 tablet 3  . diphenhydrAMINE (BENADRYL) 25 MG tablet Take 2 tablets (50 mg total) by mouth every 6 (six) hours. 60 tablet 0  . ibuprofen (ADVIL,MOTRIN) 200 MG tablet Take 200 mg by mouth every 6 (six) hours as needed (pain).    . TECFIDERA 240 MG CPDR TAKE 1 CAPSULE (240 MG) BY MOUTH TWICE DAILY (Patient not taking: Reported on 04/23/2017) 180 capsule 3   Facility-Administered Medications Prior to Visit  Medication Dose Route Frequency Provider Last Rate Last Dose  . gadopentetate dimeglumine (MAGNEVIST) injection 20 mL  20 mL Intravenous Once PRN Carlie Solorzano, Nanine Means, MD        PAST MEDICAL HISTORY: Past Medical History:  Diagnosis Date  . Movement disorder   . Multiple sclerosis ([place])     PAST SURGICAL HISTORY: Past Surgical History:  Procedure Laterality Date  . CESAREAN SECTION    . UTERINE FIBROID SURGERY    . WISDOM TOOTH EXTRACTION      FAMILY HISTORY: Family History  Problem Relation Age of Onset  . Hypertension Mother   . Hypertension Father   . Diabetes Father     SOCIAL HISTORY:  Social History   Social History  . Marital status: Married    Spouse name: N/A  . Number of children: N/A  . Years of education: N/A   Occupational History  . Not on file.   Social History Main Topics  . Smoking status: Never Smoker  . Smokeless tobacco: Never Used  . Alcohol use No  . Drug use: No  . Sexual activity: Not on file   Other Topics Concern  . Not on file   Social History Narrative  . No narrative on file     PHYSICAL  EXAM  Vitals:   04/23/17 1526  BP: 135/78  Pulse: 98  Weight: 250 lb (113.4 kg)    Body mass index is 39.16 kg/m.   General: The patient is well-developed and well-nourished and in no acute distress.   She uses a walker   Neurologic Exam  Mental status: The patient is alert and oriented x 3 at the time of the examination. The patient has apparent normal recent and remote memory, with an apparently normal attention span and concentration ability.   Speech is normal.  Cranial nerves: Extraocular movements showed that she has bilateral INO's with nystagmus.  Facial symmetry is present. Facial strength  and sensation is normal. Trapezius and sternocleidomastoid strength is normal. No dysarthria is noted.  The tongue is midline, and the patient has symmetric elevation of the soft palate. No obvious hearing deficits are noted.  Motor:  Muscle bulk is normal. Tone is increased in her legs, more the left than the right. Muscle tone is normal in the arms. Strength is normal in the arms. Currently strength is 4 minus in the hip flexors on the right and 4/5 elsewhere in the right leg. Strength is 2+/5 in the left iliopsoas and 3 to 4 minus/5 elsewhere in the left leg.       Sensory: Sensory testing is intact to touch and vibration sensation in arms.   That sensation is normal and symmetric in the legs. She has reduced vibration sensation in the left leg.  Coordination: Cerebellar testing reveals good finger-nose-finger and right heel-to-shin but very poor left HTS.  Gait and station: Station is unstable and she does better with unilateral support.   Positive Romberg,   . Wide stride with left foot drop and needs unilateral support for balance.    Reflexes: Deep tendon reflexes are increased on let more than right legs.     DIAGNOSTIC DATA (LABS, IMAGING, TESTING) - I reviewed patient records, labs, notes, testing and imaging myself where available.  Lab Results  Component Value Date   WBC  8.7 10/21/2016   HGB 14.6 10/21/2016   HCT 43.2 10/21/2016   MCV 88 10/21/2016   PLT 278 10/21/2016      ASSESSMENT AND PLAN  No diagnosis found.  1.   Continue Tecfidera. We  will check a CBC and hepatic function test to determine if there is any lymphopenia or hepatotoxicity. She needs to have an MRI of the brain, cervical spine and thoracic spine as she is having more difficulty with her gait. If she has evidence of new lesions, we will need to consider changing from Tecfidera to 1 of the IV medications.   This will also allow [place] to rule out compressive cervical myelopathy.   2.   Continue supplementation with vitamin D and vitamin B12. 3.   Advised to use the walker when she is in the house.. 4.    rtc 6 months, sooner if problems     Audrionna Lampton A. Felecia Shelling, MD, PhD 02/18/599, 4:59 PM Certified in Neurology, Clinical Neurophysiology, Sleep Medicine, Pain Medicine and Neuroimaging  [facility] [address], [place], [place] [address] [phone]    g Amado Nash

## 2017-04-24 ENCOUNTER — Telehealth: Payer: Self-pay

## 2017-04-24 LAB — HEPATIC FUNCTION PANEL
ALBUMIN: 4.6 g/dL (ref 3.5–5.5)
ALK PHOS: 58 IU/L (ref 39–117)
ALT: 20 IU/L (ref 0–32)
AST: 15 IU/L (ref 0–40)
BILIRUBIN TOTAL: 0.2 mg/dL (ref 0.0–1.2)
Bilirubin, Direct: 0.08 mg/dL (ref 0.00–0.40)
TOTAL PROTEIN: 7.5 g/dL (ref 6.0–8.5)

## 2017-04-24 LAB — CBC WITH DIFFERENTIAL/PLATELET
BASOS ABS: 0 10*3/uL (ref 0.0–0.2)
Basos: 0 %
EOS (ABSOLUTE): 0.2 10*3/uL (ref 0.0–0.4)
EOS: 2 %
Hematocrit: 42.4 % (ref 34.0–46.6)
Hemoglobin: 13.9 g/dL (ref 11.1–15.9)
IMMATURE GRANULOCYTES: 0 %
Immature Grans (Abs): 0 10*3/uL (ref 0.0–0.1)
Lymphocytes Absolute: 1.4 10*3/uL (ref 0.7–3.1)
Lymphs: 16 %
MCH: 29.4 pg (ref 26.6–33.0)
MCHC: 32.8 g/dL (ref 31.5–35.7)
MCV: 90 fL (ref 79–97)
MONOS ABS: 0.8 10*3/uL (ref 0.1–0.9)
Monocytes: 10 %
NEUTROS PCT: 72 %
Neutrophils Absolute: 6 10*3/uL (ref 1.4–7.0)
PLATELETS: 269 10*3/uL (ref 150–379)
RBC: 4.72 x10E6/uL (ref 3.77–5.28)
RDW: 14.4 % (ref 12.3–15.4)
WBC: 8.4 10*3/uL (ref 3.4–10.8)

## 2017-04-24 NOTE — Telephone Encounter (Signed)
I called pt. I advised her that her lab work was fine per Dr. Felecia Shelling. Pt says that she will discuss with her chiropractor that she needs a handicapped placard and if the chiropractor can't do it, she will call our office to ask Dr. Felecia Shelling. I offered to discuss this with Dr. Felecia Shelling for her, but she does not want to drive up here to pick it up at this time. Pt verbalized understanding of results. Pt had no questions at this time but was encouraged to call back if questions arise.

## 2017-04-24 NOTE — Telephone Encounter (Signed)
-----   Message from Britt Bottom, MD sent at 04/24/2017  9:51 AM EDT ----- Please let the patient know that the lab work is fine.

## 2017-05-28 ENCOUNTER — Telehealth: Payer: Self-pay | Admitting: Neurology

## 2017-05-28 DIAGNOSIS — R269 Unspecified abnormalities of gait and mobility: Secondary | ICD-10-CM

## 2017-05-28 DIAGNOSIS — G35 Multiple sclerosis: Secondary | ICD-10-CM

## 2017-05-28 NOTE — Telephone Encounter (Signed)
Pt is asking for RX for stair lift, shower is upstairs, her bedroom is upstairs. She has 16 stairs and is having a lot of difficulty climbing. She has been sleeping downstairs on the sofa. Please send to home address (verified).

## 2017-05-28 NOTE — Telephone Encounter (Signed)
DME order for stair lift mailed to pt's home address/fim

## 2017-06-08 ENCOUNTER — Other Ambulatory Visit: Payer: BLUE CROSS/BLUE SHIELD

## 2017-06-22 ENCOUNTER — Ambulatory Visit
Admission: RE | Admit: 2017-06-22 | Discharge: 2017-06-22 | Disposition: A | Discharge status: Home / Self Care | Payer: Medicare Other | Source: Ambulatory Visit | Attending: Neurology | Admitting: Neurology

## 2017-06-22 DIAGNOSIS — G35 Multiple sclerosis: Secondary | ICD-10-CM | POA: Diagnosis not present

## 2017-06-22 MED ORDER — GADOBENATE DIMEGLUMINE 529 MG/ML IV SOLN
20.0000 mL | Freq: Once | INTRAVENOUS | Status: AC | PRN
  Administered 2017-06-22: 20 mL via INTRAVENOUS

## 2017-06-23 ENCOUNTER — Telehealth: Payer: Self-pay | Admitting: *Deleted

## 2017-06-23 NOTE — Telephone Encounter (Signed)
Spoke with Karmina this morning and reviewed MRI results as below.  She verbalized understanding of same/fim

## 2017-06-23 NOTE — Telephone Encounter (Signed)
-----   Message from Britt Bottom, MD sent at 06/22/2017  4:06 PM EDT ----- Please let her know that the MRIs of the brain and spine show chronic changes of MS but nothing looked brand-new. The MRI of the brain and cervical spine have not changed since the MRI from 2016. She does have some spots in her spinal cord which likely explain her difficulty with gait.   She should continue the Tecfidera.

## 2017-08-19 ENCOUNTER — Other Ambulatory Visit: Payer: Self-pay | Admitting: Neurology

## 2017-08-27 ENCOUNTER — Telehealth: Payer: Self-pay | Admitting: Neurology

## 2017-08-27 NOTE — Telephone Encounter (Signed)
Pt called request RX for stairlift. Please call to discuss

## 2017-08-27 NOTE — Telephone Encounter (Signed)
Spoke with [name] and explained that the dme order I mailed her on 05/28/17 is the only way we have of giving an order for a stair lift./fim

## 2017-10-28 ENCOUNTER — Ambulatory Visit: Payer: BLUE CROSS/BLUE SHIELD | Admitting: Neurology

## 2017-11-19 ENCOUNTER — Encounter: Payer: Self-pay | Admitting: Neurology

## 2017-11-19 ENCOUNTER — Telehealth: Payer: Self-pay | Admitting: Neurology

## 2017-11-19 ENCOUNTER — Other Ambulatory Visit: Payer: Self-pay

## 2017-11-19 ENCOUNTER — Ambulatory Visit (INDEPENDENT_AMBULATORY_CARE_PROVIDER_SITE_OTHER): Payer: BLUE CROSS/BLUE SHIELD | Admitting: Neurology

## 2017-11-19 VITALS — BP 130/77 | HR 94 | Resp 18 | Ht 67.0 in | Wt 250.0 lb

## 2017-11-19 DIAGNOSIS — R350 Frequency of micturition: Secondary | ICD-10-CM

## 2017-11-19 DIAGNOSIS — R5383 Other fatigue: Secondary | ICD-10-CM | POA: Diagnosis not present

## 2017-11-19 DIAGNOSIS — R269 Unspecified abnormalities of gait and mobility: Secondary | ICD-10-CM | POA: Diagnosis not present

## 2017-11-19 DIAGNOSIS — Z79899 Other long term (current) drug therapy: Secondary | ICD-10-CM

## 2017-11-19 DIAGNOSIS — G35 Multiple sclerosis: Secondary | ICD-10-CM

## 2017-11-19 DIAGNOSIS — G35D Multiple sclerosis, unspecified: Secondary | ICD-10-CM

## 2017-11-19 MED ORDER — MODAFINIL 200 MG PO TABS: 200.0000 mg | ORAL_TABLET | Freq: Every day | ORAL | 5 refills | Status: DC

## 2017-11-19 MED ORDER — MELOXICAM 15 MG PO TABS: 15.0000 mg | ORAL_TABLET | Freq: Every day | ORAL | 11 refills | Status: DC

## 2017-11-19 NOTE — Telephone Encounter (Signed)
Spoke with [name] and advised she does not need to fast for labs this morning/fim

## 2017-11-19 NOTE — Telephone Encounter (Signed)
Pt would like to know if she needs to fast for labs today, please call

## 2017-11-19 NOTE — Progress Notes (Signed)
[facility]  PATIENT: [name] DOB: [birth year]  REFERRING CLINICIAN: Willey Blade HISTORY FROM: Patient and mother REASON FOR VISIT: Multiple sclerosis   HISTORICAL  CHIEF COMPLAINT:  Chief Complaint  Patient presents with  . Multiple Sclerosis    Sts. she continues to tolerate Tecfidera well.  Having more edema, tight, uncomfortable sensation bilat lower legs and feet/fim    HISTORY OF PRESENT ILLNESS:  [name] is a 52 year old woman with relapsing remitting MS diagnosed in 1997.    Update 11/19/2017: She feels her MS is stable. She is on Tecfidera. She tolerates it well. She has not had any recent exacerbations.    The MRI of the brain performed 06/22/2017 was compared to the MRI dated 06/06/2015. She has lesions in the hemispheres and brainstem consistent with MS. None of them were acute and there were no changes over the 2 year interval.     She has multiple foci in both the cervical and thoracic spine none of them appear to be acute.  Her gait is about the same as 6 months ago.    She uses a walker because ankles are weak.   Outside, sometimes she will use a wheelchair.    She notes pain in the ankles and feet.       There is ankle discomfort mor ethan foot.   She has a  little hand numbness.    Bladder is about the same with frequency and very rare incontinence.    She notes fatigue.   Sometimes she is sleepy during the day.   She spends a lot of time in her home.    She denies depression.   She used to be more active and did water therapy that she enjoyed.    She sleeps well at night Cognition is fine.    From 04/23/2017:  MS:    She is on Tecfidera and she tolerates it well.  No flushing or GI issues.   She denies any recent exacerbation..   Her last MRI of the brain was 06/08/2015. It did not show any enhancing lesions. An MRI of the cervical spine 06/08/2015 showed several foci within the spinal cord consistent with MS. These images were  personally reviewed today.  Gait/strength/sensation:  Her gait is a little worse compared to her last visit.    she uses a walker around the house and wheelchair for long distances outside.   The left leg is worse than the right leg and she has left AFO brace for foot drop but does not use it recently.   She denies any weakness in the arms. He r left leg is spastic.   She notes left leg ataxia.   Right leg issues are milder.  We had discussed Ampyra in the past. 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        Bladder:   She feels her bladder is better with only mild frequency now.   She does not need any medication.    Vision:  She denies any MS related vision changes.  Fatigue:   She notes mild fatigue most days. The fatigue worsens as the day goes on. She is often sleepy and takes naps most days. She sleeps well at night.   She does not snore.       .  Mood/cognition:  She feels mood is doing well though she is sometimes  irritable. She denies any cognitive dysfunction. There is no difficulty with verbal fluency or short-term memory.  LBP/leg pain:   Her back and radicular left leg pain resolved.    Pain increases with bending.   NSAIDs help.     A chiropractor told her one leg is shorter than another and she feels manipulations are helping some.    MS History:   In 1997, she presented with backache, poor gait and visual blurring. She had an MRI and a lumbar puncture. The studies were consistent with multiple sclerosis.  She turned down a disease modifying therapy at that time. Her legs and her walking got completely better and she was back at baseline after the steroids. Over the next 15 years or so she had no additional symptoms. She had worsening symptoms in the left leg that was affecting her balance. MRI 2-3 years ago showed progression. She opted not to go on medication at that time.   She went to the [place] [facility] for  several visits. At that time she was started on Tecfidera and she tolerates it well and has not had much flushing or GI symptoms. She has not had definite exacerbations. However, her gait has worsened and she has gone from a cane to a walker.  REVIEW OF SYSTEMS:  Constitutional: No fevers, chills, sweats, or change in appetite Eyes: No visual changes, double vision, eye pain Ear, nose and throat: No hearing loss, ear pain, nasal congestion, sore throat Cardiovascular: No chest pain, palpitations Respiratory:  No shortness of breath at rest or with exertion.   No wheezes GastrointestinaI: No nausea, vomiting, diarrhea, abdominal pain, fecal incontinence Genitourinary:  No dysuria, urinary retention or frequency.  No nocturia. Musculoskeletal:  No neck pain.   She reports some back pain and occasionally proximal leg pain. Integumentary: No rash, pruritus, skin lesions Neurological: as above Psychiatric: She denies depression but notes that she is sometimes moody. There is no anxiety. Endocrine: No palpitations, diaphoresis, change in appetite or increased thirst Hematologic/Lymphatic:  No anemia, purpura, petechiae. Allergic/Immunologic: No itchy/runny eyes, nasal congestion, recent allergic reactions, rashes  ALLERGIES: Allergies  Allergen Reactions  . Food Other (See Comments)    Eyes swelling and throat swelling  . Oxycodone Hcl Hives  . Soy Allergy     Other reaction(s): SWELLING    HOME MEDICATIONS: Outpatient Medications Prior to Visit  Medication Sig Dispense Refill  . [facility] D3 2000 units CAPS TAKE 1 CAPSULE BY MOUTH DAILY OR AS DIRECTED 30 capsule 11  . [facility] VITAMIN B-12 500 MCG tablet TAKE 2 TABLETS BY MOUTH EVERY DAY 180 tablet 3  . Dimethyl Fumarate (TECFIDERA) 240 MG CPDR Tecfidera 240 mg capsule,delayed release    . ibuprofen (ADVIL,MOTRIN) 200 MG tablet Take 200 mg by mouth every 6 (six) hours as needed (pain).    [name] levonorgestrel (MIRENA, 52 MG,) 20 MCG/24HR IUD Mirena 20  mcg/24 hr (5 years) intrauterine device  Take 1 device by intrauterine route.    [name] OVER THE COUNTER MEDICATION     . diphenhydrAMINE (BENADRYL) 25 MG tablet Take 2 tablets (50 mg total) by mouth every 6 (six) hours. 60 tablet 0  . famotidine (PEPCID) 20 MG tablet famotidine 20 mg tablet     Facility-Administered Medications Prior to Visit  Medication Dose Route Frequency Provider Last Rate Last Dose  . gadopentetate dimeglumine (MAGNEVIST) injection 20 mL  20 mL Intravenous Once PRN Amzie Sillas, Nanine Means, MD        PAST  MEDICAL HISTORY: Past Medical History:  Diagnosis Date  . Movement disorder   . Multiple sclerosis ([place])     PAST SURGICAL HISTORY: Past Surgical History:  Procedure Laterality Date  . CESAREAN SECTION    . UTERINE FIBROID SURGERY    . WISDOM TOOTH EXTRACTION      FAMILY HISTORY: Family History  Problem Relation Age of Onset  . Hypertension Mother   . Hypertension Father   . Diabetes Father     SOCIAL HISTORY:  Social History   Socioeconomic History  . Marital status: Married    Spouse name: Not on file  . Number of children: Not on file  . Years of education: Not on file  . Highest education level: Not on file  Social Needs  . Financial resource strain: Not on file  . Food insecurity - worry: Not on file  . Food insecurity - inability: Not on file  . Transportation needs - medical: Not on file  . Transportation needs - non-medical: Not on file  Occupational History  . Not on file  Tobacco Use  . Smoking status: Never Smoker  . Smokeless tobacco: Never Used  Substance and Sexual Activity  . Alcohol use: No  . Drug use: No  . Sexual activity: Not on file  Other Topics Concern  . Not on file  Social History Narrative  . Not on file     PHYSICAL EXAM  Vitals:   11/19/17 1120  BP: 130/77  Pulse: 94  Resp: 18  Weight: 250 lb (113.4 kg)  Height: 5\' 7"  (1.702 m)    Body mass index is 39.16 kg/m.   General: The patient is  well-developed and well-nourished and in no acute distress.   She uses a walker   Neurologic Exam  Mental status: The patient is alert and oriented x 3 at the time of the examination. The patient has apparent normal recent and remote memory, with an apparently normal attention span and concentration ability.   Speech is normal.  Cranial nerves: Extraocular movements showed that she has bilateral INO's with nystagmus.  Facial symmetry is present. Facial strength and sensation is normal. Trapezius and sternocleidomastoid strength is normal. No dysarthria is noted.  The tongue is midline, and the patient has symmetric elevation of the soft palate. No obvious hearing deficits are noted.  Motor:  Muscle bulk is normal. Tone is increased in her legs, more the left than the right. Muscle tone is normal in the arms. Strength is normal in the arms, though rapid alternating movements were reduced in the left arm. In the legs, strength was 3/5 in the right hip flexors and 2/5 in the left. Strength was 3/5 in the lower right leg and 2+/5 in the lower left leg.   Sensory: In the arms, there was normal sensation to touch and vibration. She had normal touch sensation in the legs but reduced vibratory sensation in the left leg.  \  Coordination: Cerebellar testing reveals good finger-nose-finger and is unable to do heel-to-shin  Gait and station: Station is unstable and she does better with unilateral support.   Positive Romberg,   . Wide stride with left foot drop and needs unilateral support for balance.    Reflexes: Deep tendon reflexes are increased on let more than right legs.     DIAGNOSTIC DATA (LABS, IMAGING, TESTING) - I reviewed patient records, labs, notes, testing and imaging myself where available.  Lab Results  Component Value Date   WBC 8.4  04/23/2017   HGB 13.9 04/23/2017   HCT 42.4 04/23/2017   MCV 90 04/23/2017   PLT 269 04/23/2017      ASSESSMENT AND PLAN  Multiple sclerosis  ([place]) - Plan: CBC with Differential/Platelet, Ambulatory referral to Physical Therapy  Gait disturbance - Plan: Ambulatory referral to Physical Therapy  Other fatigue  Urinary frequency  High risk medication use  1.   She will continue Tecfidera. I'll check a CBC to make sure that she is not having any lymphopenia.  2.   Continue supplementation with vitamin D and vitamin B12. 3.   She will use a walker around her house and a wheelchair outside the house. 4.    With a long discussion about mood. She does have a flat affect and seems a little bit depressed though she denies it. She does not want to start a antidepressant. I advised her to try to get out of the house every day that the weather is decent even if she just stays in her yard. We also discussed trying to do more activities with others. 5.     Physical therapy/neuro rehabilitation to help with the gait. 6.   Modafinil 200 mg by mouth every morning to help with her sleepiness and lethargy. 7.    Meloxicam for leg pain. She has spasticity but was unable to tolerate some muscle spasm drugs in the past. 8.  rtc 6 months, sooner if problems   40 minutes face-to-face interaction with greater than one time counseling correlating care about her MS and related symptoms.    Savior Himebaugh A. Felecia Shelling, MD, PhD 05/20/6386, 5:64 PM Certified in Neurology, Clinical Neurophysiology, Sleep Medicine, Pain Medicine and Neuroimaging  [facility] [address] [place], [place] [address] [phone]

## 2017-11-20 ENCOUNTER — Telehealth: Payer: Self-pay | Admitting: *Deleted

## 2017-11-20 LAB — CBC WITH DIFFERENTIAL/PLATELET
Basophils Absolute: 0 10*3/uL (ref 0.0–0.2)
Basos: 0 %
EOS (ABSOLUTE): 0.4 10*3/uL (ref 0.0–0.4)
Eos: 4 %
Hematocrit: 40.6 % (ref 34.0–46.6)
Hemoglobin: 14 g/dL (ref 11.1–15.9)
IMMATURE GRANULOCYTES: 0 %
Immature Grans (Abs): 0 10*3/uL (ref 0.0–0.1)
Lymphocytes Absolute: 1.5 10*3/uL (ref 0.7–3.1)
Lymphs: 18 %
MCH: 31 pg (ref 26.6–33.0)
MCHC: 34.5 g/dL (ref 31.5–35.7)
MCV: 90 fL (ref 79–97)
MONOS ABS: 0.6 10*3/uL (ref 0.1–0.9)
Monocytes: 7 %
NEUTROS PCT: 71 %
Neutrophils Absolute: 5.8 10*3/uL (ref 1.4–7.0)
PLATELETS: 251 10*3/uL (ref 150–379)
RBC: 4.51 x10E6/uL (ref 3.77–5.28)
RDW: 14.4 % (ref 12.3–15.4)
WBC: 8.3 10*3/uL (ref 3.4–10.8)

## 2017-11-20 NOTE — Telephone Encounter (Signed)
-----   Message from Britt Bottom, MD sent at 11/20/2017 10:42 AM EST ----- Please let the patient know that the lab work is fine.

## 2017-11-20 NOTE — Telephone Encounter (Signed)
Spoke with [name] and reviewed below lab results.  She verbalized understanding of same/fim 

## 2017-11-20 NOTE — Telephone Encounter (Signed)
PA for Modafinil 200mg  tablets #30/30 completed via Cover My Meds.  Dx: Fatigue related to MS (G35, R53.83).Hilton Cork

## 2017-11-25 NOTE — Telephone Encounter (Signed)
CaseId:[id number];Status:Approved;Review Type:Prior Auth;Coverage Start Date:10/21/2017;Coverage End Date:11/20/2018;

## 2017-12-15 ENCOUNTER — Telehealth: Payer: Self-pay | Admitting: Neurology

## 2017-12-15 NOTE — Telephone Encounter (Signed)
Pt wants to stop taking the anti inflammatory that Dr Felecia Shelling has her on. She said she has been on it for about 30 days and it is helping ankles and hands some but not feet much.  Pt would like to try OTC  Cura Med by Berkley Harvey and wants to know if 750 mg would be okay to take.  Pt is asking for a call

## 2017-12-15 NOTE — Telephone Encounter (Signed)
Spoke with [name] and explained ok to stop Meloxicam and try the otc supplement (turmeric) from the [facility], and dose rec. on package. She verbalized understanding of same, sts. is seeing a holistic FNP in May.  She will run any med changes by RAS/fim

## 2018-02-10 ENCOUNTER — Other Ambulatory Visit: Payer: Self-pay | Admitting: Neurology

## 2018-02-17 DIAGNOSIS — E039 Hypothyroidism, unspecified: Secondary | ICD-10-CM | POA: Diagnosis not present

## 2018-02-17 DIAGNOSIS — R5383 Other fatigue: Secondary | ICD-10-CM | POA: Diagnosis not present

## 2018-03-17 ENCOUNTER — Other Ambulatory Visit: Payer: Self-pay | Admitting: Neurology

## 2018-03-22 DIAGNOSIS — E569 Vitamin deficiency, unspecified: Secondary | ICD-10-CM | POA: Diagnosis not present

## 2018-03-22 DIAGNOSIS — E039 Hypothyroidism, unspecified: Secondary | ICD-10-CM | POA: Diagnosis not present

## 2018-03-22 DIAGNOSIS — R799 Abnormal finding of blood chemistry, unspecified: Secondary | ICD-10-CM | POA: Diagnosis not present

## 2018-03-22 DIAGNOSIS — E559 Vitamin D deficiency, unspecified: Secondary | ICD-10-CM | POA: Diagnosis not present

## 2018-03-22 DIAGNOSIS — R5382 Chronic fatigue, unspecified: Secondary | ICD-10-CM | POA: Diagnosis not present

## 2018-03-22 DIAGNOSIS — D510 Vitamin B12 deficiency anemia due to intrinsic factor deficiency: Secondary | ICD-10-CM | POA: Diagnosis not present

## 2018-03-22 DIAGNOSIS — D649 Anemia, unspecified: Secondary | ICD-10-CM | POA: Diagnosis not present

## 2018-04-07 DIAGNOSIS — Z79899 Other long term (current) drug therapy: Secondary | ICD-10-CM | POA: Diagnosis not present

## 2018-04-07 DIAGNOSIS — E569 Vitamin deficiency, unspecified: Secondary | ICD-10-CM | POA: Diagnosis not present

## 2018-04-07 DIAGNOSIS — D510 Vitamin B12 deficiency anemia due to intrinsic factor deficiency: Secondary | ICD-10-CM | POA: Diagnosis not present

## 2018-04-07 DIAGNOSIS — I7090 Unspecified atherosclerosis: Secondary | ICD-10-CM | POA: Diagnosis not present

## 2018-04-07 DIAGNOSIS — E349 Endocrine disorder, unspecified: Secondary | ICD-10-CM | POA: Diagnosis not present

## 2018-04-07 DIAGNOSIS — E559 Vitamin D deficiency, unspecified: Secondary | ICD-10-CM | POA: Diagnosis not present

## 2018-04-07 DIAGNOSIS — M2550 Pain in unspecified joint: Secondary | ICD-10-CM | POA: Diagnosis not present

## 2018-04-07 DIAGNOSIS — R799 Abnormal finding of blood chemistry, unspecified: Secondary | ICD-10-CM | POA: Diagnosis not present

## 2018-04-07 DIAGNOSIS — K2900 Acute gastritis without bleeding: Secondary | ICD-10-CM | POA: Diagnosis not present

## 2018-04-07 DIAGNOSIS — R5382 Chronic fatigue, unspecified: Secondary | ICD-10-CM | POA: Diagnosis not present

## 2018-04-07 DIAGNOSIS — E039 Hypothyroidism, unspecified: Secondary | ICD-10-CM | POA: Diagnosis not present

## 2018-05-24 DIAGNOSIS — R5382 Chronic fatigue, unspecified: Secondary | ICD-10-CM | POA: Diagnosis not present

## 2018-05-24 DIAGNOSIS — E039 Hypothyroidism, unspecified: Secondary | ICD-10-CM | POA: Diagnosis not present

## 2018-05-31 ENCOUNTER — Other Ambulatory Visit: Payer: Self-pay

## 2018-05-31 ENCOUNTER — Ambulatory Visit (INDEPENDENT_AMBULATORY_CARE_PROVIDER_SITE_OTHER): Payer: BLUE CROSS/BLUE SHIELD | Admitting: Neurology

## 2018-05-31 ENCOUNTER — Encounter: Payer: Self-pay | Admitting: Neurology

## 2018-05-31 VITALS — BP 136/70 | HR 72 | Resp 18 | Ht 67.0 in | Wt 250.0 lb

## 2018-05-31 DIAGNOSIS — R5383 Other fatigue: Secondary | ICD-10-CM | POA: Diagnosis not present

## 2018-05-31 DIAGNOSIS — M21372 Foot drop, left foot: Secondary | ICD-10-CM | POA: Diagnosis not present

## 2018-05-31 DIAGNOSIS — Z79899 Other long term (current) drug therapy: Secondary | ICD-10-CM

## 2018-05-31 DIAGNOSIS — R269 Unspecified abnormalities of gait and mobility: Secondary | ICD-10-CM | POA: Diagnosis not present

## 2018-05-31 DIAGNOSIS — R350 Frequency of micturition: Secondary | ICD-10-CM

## 2018-05-31 DIAGNOSIS — G35 Multiple sclerosis: Secondary | ICD-10-CM

## 2018-05-31 NOTE — Progress Notes (Signed)
[facility]  PATIENT: [name] DOB: 08/04/1966  REFERRING CLINICIAN: Willey Blade HISTORY FROM: Patient and mother REASON FOR VISIT: Multiple sclerosis   HISTORICAL  CHIEF COMPLAINT:  Chief Complaint  Patient presents with  . Multiple Sclerosis    Sts. she continues to tolerate Tecfidera well.  Not taking Provigil and can't remember why. Continues to walk at home, uses a w/c outside of the home. One fall in the last several months--was walking at home, twisted her ankle and fell--belives it may have been due to the type of socks/shoes she she had on/fim    HISTORY OF PRESENT ILLNESS:  [name] is a 52 y.o. woman with relapsing remitting MS diagnosed in 1997.    Update 05/31/2018: She feels she is mostly stable.   With a walker she can walk around her house.    She feels she is mildly better compared to earlier in the year.     She has ben reluctant to try Ampyra due to the risk of seizures.    She denies numbness (occ tingling).   Her left leg is usually weaker than the right but sometimes legs are equal.     She has urinary urgency and has frequency (q 2-3 hours).     She falls asleep easily but has 2 x nocturia.   She usually falls back asleep easily.    She has some fatigue and   She has some joint pain.   She stopped meloxicam and is on tumeric and feels it is better than the NSAID.      Update 11/19/2017: She feels her MS is stable. She is on Tecfidera. She tolerates it well. She has not had any recent exacerbations.    The MRI of the brain performed 06/22/2017 was compared to the MRI dated 06/06/2015. She has lesions in the hemispheres and brainstem consistent with MS. None of them were acute and there were no changes over the 2 year interval.     She has multiple foci in both the cervical and thoracic spine none of them appear to be acute.  Her gait is about the same as 6 months ago.    She uses a walker because ankles are weak.   Outside,  sometimes she will use a wheelchair.    She notes pain in the ankles and feet.       There is ankle discomfort mor ethan foot.   She has a  little hand numbness.    Bladder is about the same with frequency and very rare incontinence.    She notes fatigue.   Sometimes she is sleepy during the day.   She spends a lot of time in her home.    She denies depression.   She used to be more active and did water therapy that she enjoyed.    She sleeps well at night Cognition is fine.    From 04/23/2017:  MS:    She is on Tecfidera and she tolerates it well.  No flushing or GI issues.   She denies any recent exacerbation..   Her last MRI of the brain was 06/08/2015. It did not show any enhancing lesions. An MRI of the cervical spine 06/08/2015 showed several foci within the spinal cord consistent with MS. These images were personally reviewed today.  Gait/strength/sensation:  Her gait is a little worse compared to her last visit.    she uses a walker around the house and wheelchair for long distances  outside.   The left leg is worse than the right leg and she has left AFO brace for foot drop but does not use it recently.   She denies any weakness in the arms. He r left leg is spastic.   She notes left leg ataxia.   Right leg issues are milder.  We had discussed Ampyra in the past. 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        Bladder:   She feels her bladder is better with only mild frequency now.   She does not need any medication.    Vision:  She denies any MS related vision changes.  Fatigue:   She notes mild fatigue most days. The fatigue worsens as the day goes on. She is often sleepy and takes naps most days. She sleeps well at night.   She does not snore.       .  Mood/cognition:  She feels mood is doing well though she is sometimes irritable. She denies any cognitive dysfunction. There is no difficulty with verbal fluency or  short-term memory.  LBP/leg pain:   Her back and radicular left leg pain resolved.    Pain increases with bending.   NSAIDs help.     A chiropractor told her one leg is shorter than another and she feels manipulations are helping some.    MS History:   In 1997, she presented with backache, poor gait and visual blurring. She had an MRI and a lumbar puncture. The studies were consistent with multiple sclerosis.  She turned down a disease modifying therapy at that time. Her legs and her walking got completely better and she was back at baseline after the steroids. Over the next 15 years or so she had no additional symptoms. She had worsening symptoms in the left leg that was affecting her balance. MRI 2-3 years ago showed progression. She opted not to go on medication at that time.   She went to [facility] for several visits. At that time she was started on Tecfidera and she tolerates it well and has not had much flushing or GI symptoms. She has not had definite exacerbations. However, her gait has worsened and she has gone from a cane to a walker.  REVIEW OF SYSTEMS:  Constitutional: No fevers, chills, sweats, or change in appetite Eyes: No visual changes, double vision, eye pain Ear, nose and throat: No hearing loss, ear pain, nasal congestion, sore throat Cardiovascular: No chest pain, palpitations Respiratory:  No shortness of breath at rest or with exertion.   No wheezes GastrointestinaI: No nausea, vomiting, diarrhea, abdominal pain, fecal incontinence Genitourinary:  No dysuria, urinary retention or frequency.  No nocturia. Musculoskeletal:  No neck pain.   She reports some back pain and occasionally proximal leg pain. Integumentary: No rash, pruritus, skin lesions Neurological: as above Psychiatric: She denies depression but notes that she is sometimes moody. There is no anxiety. Endocrine: No palpitations, diaphoresis, change in appetite or increased thirst Hematologic/Lymphatic:   No anemia, purpura, petechiae. Allergic/Immunologic: No itchy/runny eyes, nasal congestion, recent allergic reactions, rashes  ALLERGIES: Allergies  Allergen Reactions  . Food Other (See Comments)    Eyes swelling and throat swelling  . Oxycodone Hcl Hives  . Soy Allergy     Other reaction(s): SWELLING    HOME MEDICATIONS: Outpatient Medications Prior to Visit  Medication Sig Dispense Refill  .  Coenzyme Q10 (COQ10 PO) Take 150 mg by mouth daily.    . CVS B-12 500 MCG tablet TAKE 2 TABS BY MOUTH DAILY 100 tablet 7  . CVS D3 2000 units CAPS TAKE 1 CAPSULE BY MOUTH DAILY OR AS DIRECTED 30 capsule 11  . Misc Natural Products (TURMERIC CURCUMIN) CAPS Take by mouth 2 (two) times daily.    . TECFIDERA 240 MG CPDR TAKE 1 CAPSULE (240 MG) BY MOUTH TWICE DAILY 180 capsule 3  . diphenhydrAMINE (BENADRYL) 25 MG tablet Take 2 tablets (50 mg total) by mouth every 6 (six) hours. 60 tablet 0  . modafinil (PROVIGIL) 200 MG tablet Take 1 tablet (200 mg total) by mouth daily. In the morning (Patient not taking: Reported on 05/31/2018) 30 tablet 5  . progesterone (PROMETRIUM) 100 MG capsule TAKE 1 CAPSULE BY MOUTH EVERYDAY AT BEDTIME  0  . famotidine (PEPCID) 20 MG tablet famotidine 20 mg tablet    . ibuprofen (ADVIL,MOTRIN) 200 MG tablet Take 200 mg by mouth every 6 (six) hours as needed (pain).    [name] levonorgestrel (MIRENA, 52 MG,) 20 MCG/24HR IUD Mirena 20 mcg/24 hr (5 years) intrauterine device  Take 1 device by intrauterine route.    . meloxicam (MOBIC) 15 MG tablet Take 1 tablet (15 mg total) by mouth daily. 30 tablet 11  . OVER THE COUNTER MEDICATION      Facility-Administered Medications Prior to Visit  Medication Dose Route Frequency Provider Last Rate Last Dose  . gadopentetate dimeglumine (MAGNEVIST) injection 20 mL  20 mL Intravenous Once PRN Sater, Nanine Means, MD        PAST MEDICAL HISTORY: Past Medical History:  Diagnosis Date  . Movement disorder   . Multiple sclerosis ([place])      PAST SURGICAL HISTORY: Past Surgical History:  Procedure Laterality Date  . CESAREAN SECTION    . UTERINE FIBROID SURGERY    . WISDOM TOOTH EXTRACTION      FAMILY HISTORY: Family History  Problem Relation Age of Onset  . Hypertension Mother   . Hypertension Father   . Diabetes Father     SOCIAL HISTORY:  Social History   Socioeconomic History  . Marital status: Married    Spouse name: Not on file  . Number of children: Not on file  . Years of education: Not on file  . Highest education level: Not on file  Occupational History  . Not on file  Social Needs  . Financial resource strain: Not on file  . Food insecurity:    Worry: Not on file    Inability: Not on file  . Transportation needs:    Medical: Not on file    Non-medical: Not on file  Tobacco Use  . Smoking status: Never Smoker  . Smokeless tobacco: Never Used  Substance and Sexual Activity  . Alcohol use: No  . Drug use: No  . Sexual activity: Not on file  Lifestyle  . Physical activity:    Days per week: Not on file    Minutes per session: Not on file  . Stress: Not on file  Relationships  . Social connections:    Talks on phone: Not on file    Gets together: Not on file    Attends religious service: Not on file    Active member of club or organization: Not on file    Attends meetings of clubs or organizations: Not on file    Relationship status: Not on file  . Intimate partner  violence:    Fear of current or ex partner: Not on file    Emotionally abused: Not on file    Physically abused: Not on file    Forced sexual activity: Not on file  Other Topics Concern  . Not on file  Social History Narrative  . Not on file     PHYSICAL EXAM  Vitals:   05/31/18 1114  BP: 136/70  Pulse: 72  Resp: 18  Weight: 250 lb (113.4 kg)  Height: 5\' 7"  (1.702 m)    Body mass index is 39.16 kg/m.   General: The patient is well-developed and well-nourished and in no acute distress.   She uses a  walker   Neurologic Exam  Mental status: The patient is alert and oriented x 3 at the time of the examination. The patient has apparent normal recent and remote memory, with an apparently normal attention span and concentration ability.   Speech is normal.  Cranial nerves: She has bilateral INO's and nystagmus.  Facial symmetry is present. Facial strength and sensation is normal. Trapezius and sternocleidomastoid strength is normal. No dysarthria is noted.  The tongue is midline, and the patient has symmetric elevation of the soft palate. No obvious hearing deficits are noted.  Motor:  Muscle bulk is normal.  Muscle tone is increased in the legs, left greater than right.  Muscle tone was fairly normal in the arms.  Strength was normal in the right arm and just minimally reduced in the left arm (triceps) though left rapid alternating movements were slowed.  In the legs, she has reduced strength bilaterally , 3/5 on the right with 4-/5 quads and 2-3/5 on the left.    Sensory: In the arms, there was normal sensation to touch and vibration. She had normal touch sensation in the legs but reduced vibratory sensation in the left leg.  \  Coordination: Cerebellar testing reveals good finger-nose-finger and is unable to do heel-to-shin  Gait and station: Station is unstable and she does better with unilateral support.   Positive Romberg,   . Wide stride with left foot drop and needs unilateral support for balance.    Reflexes: Deep tendon reflexes are increased on let more than right legs.     DIAGNOSTIC DATA (LABS, IMAGING, TESTING) - I reviewed patient records, labs, notes, testing and imaging myself where available.  Lab Results  Component Value Date   WBC 8.3 11/19/2017   HGB 14.0 11/19/2017   HCT 40.6 11/19/2017   MCV 90 11/19/2017   PLT 251 11/19/2017      ASSESSMENT AND PLAN  Multiple sclerosis ([place]) - Plan: CBC with Differential/Platelet, Ambulatory referral to Physical  Therapy  Gait disturbance - Plan: Ambulatory referral to Physical Therapy  Left foot drop  Other fatigue  Urinary frequency  High risk medication use  1.   Continue Tecfidera.  We will check a CBC with differential to determine if there is any lymphopenia. 2.   Continue supplementary vitamin D and B12.. 3.   She will use a walker around the house and wheelchair outside the house. 4.    We discussed trying to stay active and try to get out of the house on a daily basis. 5.     Physical therapy/neuro rehabilitation to help with the gait. 6.   rtc 6 months, sooner if problems     Richard A. Felecia Shelling, MD, PhD 9/93/7169, 67:89 PM Certified in Neurology, Clinical Neurophysiology, Sleep Medicine, Pain Medicine and Neuroimaging  [facility]  [place], [address] [place], [place] [address] [phone]

## 2018-06-01 ENCOUNTER — Telehealth: Payer: Self-pay | Admitting: *Deleted

## 2018-06-01 LAB — CBC WITH DIFFERENTIAL/PLATELET
Basophils Absolute: 0 10*3/uL (ref 0.0–0.2)
Basos: 0 %
EOS (ABSOLUTE): 0.2 10*3/uL (ref 0.0–0.4)
EOS: 2 %
HEMATOCRIT: 41.8 % (ref 34.0–46.6)
HEMOGLOBIN: 13.6 g/dL (ref 11.1–15.9)
Immature Grans (Abs): 0 10*3/uL (ref 0.0–0.1)
Immature Granulocytes: 0 %
LYMPHS ABS: 1.1 10*3/uL (ref 0.7–3.1)
Lymphs: 12 %
MCH: 29.6 pg (ref 26.6–33.0)
MCHC: 32.5 g/dL (ref 31.5–35.7)
MCV: 91 fL (ref 79–97)
MONOCYTES: 10 %
Monocytes Absolute: 0.9 10*3/uL (ref 0.1–0.9)
NEUTROS ABS: 6.7 10*3/uL (ref 1.4–7.0)
Neutrophils: 76 %
Platelets: 272 10*3/uL (ref 150–450)
RBC: 4.59 x10E6/uL (ref 3.77–5.28)
RDW: 14.3 % (ref 12.3–15.4)
WBC: 8.9 10*3/uL (ref 3.4–10.8)

## 2018-06-01 NOTE — Telephone Encounter (Signed)
Spoke with Chauntae and reviewed below lab results.  She verbalized understanding of same/fim

## 2018-06-01 NOTE — Telephone Encounter (Signed)
-----   Message from Britt Bottom, MD sent at 06/01/2018  9:12 AM EDT ----- Please let the patient know that the lab work is fine.

## 2018-08-20 DIAGNOSIS — E039 Hypothyroidism, unspecified: Secondary | ICD-10-CM | POA: Diagnosis not present

## 2018-08-20 DIAGNOSIS — E569 Vitamin deficiency, unspecified: Secondary | ICD-10-CM | POA: Diagnosis not present

## 2018-10-06 DIAGNOSIS — Z1231 Encounter for screening mammogram for malignant neoplasm of breast: Secondary | ICD-10-CM | POA: Diagnosis not present

## 2018-10-06 DIAGNOSIS — G35 Multiple sclerosis: Secondary | ICD-10-CM | POA: Diagnosis not present

## 2018-10-06 DIAGNOSIS — Z3040 Encounter for surveillance of contraceptives, unspecified: Secondary | ICD-10-CM | POA: Diagnosis not present

## 2018-10-06 DIAGNOSIS — Z01411 Encounter for gynecological examination (general) (routine) with abnormal findings: Secondary | ICD-10-CM | POA: Diagnosis not present

## 2018-10-06 DIAGNOSIS — N912 Amenorrhea, unspecified: Secondary | ICD-10-CM | POA: Diagnosis not present

## 2018-10-06 DIAGNOSIS — D259 Leiomyoma of uterus, unspecified: Secondary | ICD-10-CM | POA: Diagnosis not present

## 2018-10-06 DIAGNOSIS — N920 Excessive and frequent menstruation with regular cycle: Secondary | ICD-10-CM | POA: Diagnosis not present

## 2018-10-06 DIAGNOSIS — Z124 Encounter for screening for malignant neoplasm of cervix: Secondary | ICD-10-CM | POA: Diagnosis not present

## 2018-10-15 ENCOUNTER — Other Ambulatory Visit: Payer: Self-pay | Admitting: Neurology

## 2018-10-28 ENCOUNTER — Other Ambulatory Visit: Payer: Self-pay

## 2018-10-28 ENCOUNTER — Emergency Department (HOSPITAL_COMMUNITY): Payer: BLUE CROSS/BLUE SHIELD

## 2018-10-28 ENCOUNTER — Inpatient Hospital Stay (HOSPITAL_COMMUNITY)
Admission: EM | Admit: 2018-10-28 | Discharge: 2018-10-30 | DRG: 690 | Disposition: A | Discharge status: Home Health | Payer: BLUE CROSS/BLUE SHIELD | Attending: Internal Medicine | Admitting: Internal Medicine

## 2018-10-28 ENCOUNTER — Encounter (HOSPITAL_COMMUNITY): Payer: Self-pay

## 2018-10-28 DIAGNOSIS — K08409 Partial loss of teeth, unspecified cause, unspecified class: Secondary | ICD-10-CM | POA: Diagnosis present

## 2018-10-28 DIAGNOSIS — R61 Generalized hyperhidrosis: Secondary | ICD-10-CM | POA: Diagnosis not present

## 2018-10-28 DIAGNOSIS — N3000 Acute cystitis without hematuria: Secondary | ICD-10-CM | POA: Diagnosis not present

## 2018-10-28 DIAGNOSIS — R5383 Other fatigue: Secondary | ICD-10-CM | POA: Diagnosis not present

## 2018-10-28 DIAGNOSIS — Z79899 Other long term (current) drug therapy: Secondary | ICD-10-CM | POA: Diagnosis not present

## 2018-10-28 DIAGNOSIS — R531 Weakness: Secondary | ICD-10-CM | POA: Diagnosis not present

## 2018-10-28 DIAGNOSIS — Z91018 Allergy to other foods: Secondary | ICD-10-CM | POA: Diagnosis not present

## 2018-10-28 DIAGNOSIS — M21372 Foot drop, left foot: Secondary | ICD-10-CM | POA: Diagnosis present

## 2018-10-28 DIAGNOSIS — Z6841 Body Mass Index (BMI) 40.0 and over, adult: Secondary | ICD-10-CM | POA: Diagnosis not present

## 2018-10-28 DIAGNOSIS — G35 Multiple sclerosis: Secondary | ICD-10-CM | POA: Diagnosis present

## 2018-10-28 DIAGNOSIS — Z885 Allergy status to narcotic agent status: Secondary | ICD-10-CM

## 2018-10-28 DIAGNOSIS — N390 Urinary tract infection, site not specified: Principal | ICD-10-CM | POA: Diagnosis present

## 2018-10-28 DIAGNOSIS — R55 Syncope and collapse: Secondary | ICD-10-CM | POA: Diagnosis not present

## 2018-10-28 DIAGNOSIS — Z7401 Bed confinement status: Secondary | ICD-10-CM | POA: Diagnosis not present

## 2018-10-28 DIAGNOSIS — R000 Tachycardia, unspecified: Secondary | ICD-10-CM | POA: Diagnosis not present

## 2018-10-28 DIAGNOSIS — K5900 Constipation, unspecified: Secondary | ICD-10-CM | POA: Diagnosis not present

## 2018-10-28 DIAGNOSIS — M2550 Pain in unspecified joint: Secondary | ICD-10-CM | POA: Diagnosis not present

## 2018-10-28 DIAGNOSIS — R5381 Other malaise: Secondary | ICD-10-CM | POA: Diagnosis not present

## 2018-10-28 LAB — COMPREHENSIVE METABOLIC PANEL
ALBUMIN: 4.8 g/dL (ref 3.5–5.0)
ALT: 31 U/L (ref 0–44)
AST: 25 U/L (ref 15–41)
Alkaline Phosphatase: 62 U/L (ref 38–126)
Anion gap: 10 (ref 5–15)
BILIRUBIN TOTAL: 0.7 mg/dL (ref 0.3–1.2)
BUN: 13 mg/dL (ref 6–20)
CO2: 24 mmol/L (ref 22–32)
Calcium: 9 mg/dL (ref 8.9–10.3)
Chloride: 103 mmol/L (ref 98–111)
Creatinine, Ser: 0.83 mg/dL (ref 0.44–1.00)
Glucose, Bld: 115 mg/dL — ABNORMAL HIGH (ref 70–99)
POTASSIUM: 4.4 mmol/L (ref 3.5–5.1)
Sodium: 137 mmol/L (ref 135–145)
TOTAL PROTEIN: 8.8 g/dL — AB (ref 6.5–8.1)

## 2018-10-28 LAB — CBC WITH DIFFERENTIAL/PLATELET
Abs Immature Granulocytes: 0.03 10*3/uL (ref 0.00–0.07)
BASOS ABS: 0 10*3/uL (ref 0.0–0.1)
Basophils Relative: 0 %
EOS ABS: 0 10*3/uL (ref 0.0–0.5)
Eosinophils Relative: 0 %
HEMATOCRIT: 44.4 % (ref 36.0–46.0)
HEMOGLOBIN: 14.7 g/dL (ref 12.0–15.0)
Immature Granulocytes: 0 %
LYMPHS PCT: 5 %
Lymphs Abs: 0.5 10*3/uL — ABNORMAL LOW (ref 0.7–4.0)
MCH: 29.6 pg (ref 26.0–34.0)
MCHC: 33.1 g/dL (ref 30.0–36.0)
MCV: 89.5 fL (ref 80.0–100.0)
MONOS PCT: 3 %
Monocytes Absolute: 0.3 10*3/uL (ref 0.1–1.0)
NEUTROS ABS: 10 10*3/uL — AB (ref 1.7–7.7)
NEUTROS PCT: 92 %
Platelets: 324 10*3/uL (ref 150–400)
RBC: 4.96 MIL/uL (ref 3.87–5.11)
RDW: 13.5 % (ref 11.5–15.5)
WBC: 10.9 10*3/uL — ABNORMAL HIGH (ref 4.0–10.5)
nRBC: 0 % (ref 0.0–0.2)

## 2018-10-28 LAB — URINALYSIS, ROUTINE W REFLEX MICROSCOPIC
BILIRUBIN URINE: NEGATIVE
Glucose, UA: NEGATIVE mg/dL
KETONES UR: NEGATIVE mg/dL
Nitrite: POSITIVE — AB
PROTEIN: NEGATIVE mg/dL
Specific Gravity, Urine: 1.006 (ref 1.005–1.030)
pH: 6 (ref 5.0–8.0)

## 2018-10-28 LAB — LACTIC ACID, PLASMA: Lactic Acid, Venous: 1.5 mmol/L (ref 0.5–1.9)

## 2018-10-28 LAB — TROPONIN I

## 2018-10-28 MED ORDER — SODIUM CHLORIDE 0.9 % IV SOLN
1.0000 g | Freq: Once | INTRAVENOUS | Status: AC
  Administered 2018-10-28: 1 g via INTRAVENOUS
  Filled 2018-10-28: qty 10

## 2018-10-28 MED ORDER — ONDANSETRON HCL 4 MG PO TABS: 4.0000 mg | ORAL_TABLET | Freq: Four times a day (QID) | ORAL | Status: DC | PRN

## 2018-10-28 MED ORDER — SODIUM CHLORIDE 0.9 % IV SOLN
1.0000 g | INTRAVENOUS | Status: DC
  Administered 2018-10-29: 1 g via INTRAVENOUS
  Filled 2018-10-28: qty 1

## 2018-10-28 MED ORDER — ONDANSETRON HCL 4 MG/2ML IJ SOLN: 4.0000 mg | Freq: Four times a day (QID) | INTRAMUSCULAR | Status: DC | PRN

## 2018-10-28 MED ORDER — VITAMIN D 25 MCG (1000 UNIT) PO TABS
2000.0000 [IU] | ORAL_TABLET | Freq: Every day | ORAL | Status: DC
  Administered 2018-10-29 – 2018-10-30 (×2): 2000 [IU] via ORAL
  Filled 2018-10-28 (×2): qty 2

## 2018-10-28 MED ORDER — LACTATED RINGERS IV BOLUS
1000.0000 mL | Freq: Once | INTRAVENOUS | Status: AC
  Administered 2018-10-28: 1000 mL via INTRAVENOUS

## 2018-10-28 MED ORDER — TURMERIC CURCUMIN PO CAPS: ORAL_CAPSULE | Freq: Two times a day (BID) | ORAL | Status: DC

## 2018-10-28 MED ORDER — ALUM & MAG HYDROXIDE-SIMETH 200-200-20 MG/5ML PO SUSP
30.0000 mL | ORAL | Status: DC | PRN
  Administered 2018-10-29 (×2): 30 mL via ORAL
  Filled 2018-10-28 (×2): qty 30

## 2018-10-28 MED ORDER — VITAMIN B-12 1000 MCG PO TABS
1000.0000 ug | ORAL_TABLET | Freq: Every day | ORAL | Status: DC
  Administered 2018-10-29 – 2018-10-30 (×2): 1000 ug via ORAL
  Filled 2018-10-28 (×2): qty 1

## 2018-10-28 MED ORDER — HEPARIN SODIUM (PORCINE) 5000 UNIT/ML IJ SOLN
5000.0000 [IU] | Freq: Three times a day (TID) | INTRAMUSCULAR | Status: DC
  Administered 2018-10-28 – 2018-10-30 (×6): 5000 [IU] via SUBCUTANEOUS
  Filled 2018-10-28 (×6): qty 1

## 2018-10-28 MED ORDER — PROGESTERONE MICRONIZED 100 MG PO CAPS
100.0000 mg | ORAL_CAPSULE | Freq: Every day | ORAL | Status: DC
  Administered 2018-10-28 – 2018-10-29 (×2): 100 mg via ORAL
  Filled 2018-10-28 (×2): qty 1

## 2018-10-28 MED ORDER — DIMETHYL FUMARATE 240 MG PO CPDR
240.0000 mg | DELAYED_RELEASE_CAPSULE | Freq: Two times a day (BID) | ORAL | Status: DC
  Administered 2018-10-29 – 2018-10-30 (×3): 240 mg via ORAL

## 2018-10-28 MED ORDER — DEXTROSE-NACL 5-0.45 % IV SOLN
INTRAVENOUS | Status: DC
  Administered 2018-10-28 – 2018-10-30 (×4): via INTRAVENOUS

## 2018-10-28 MED ORDER — DIPHENHYDRAMINE HCL 25 MG PO TABS: 50.0000 mg | ORAL_TABLET | Freq: Four times a day (QID) | ORAL | Status: DC

## 2018-10-28 NOTE — ED Triage Notes (Signed)
Per EMS: Pt c/o of constipation x4 days.  Pt was able to have BM today.  Pt became dizzy, presyncopal, and diaphoretic when passing BM.  Pt now states she feels improvement.

## 2018-10-28 NOTE — ED Notes (Signed)
Report given to [facility]

## 2018-10-28 NOTE — ED Provider Notes (Signed)
Patient accepted at signout from Dr. Ardith Dark for reassessment.  Patient has increasing weakness.  UTI has been identified.  Patient was rehydrated and given antibiotics.  Plan for reassessment for general condition.  Patient reports she remains very weak.  She tried to ambulate at baseline with assistance but reports it was very difficult.  Heart rate remains elevated at rest despite fluid resuscitation.  Will plan for admission.   Charlesetta Shanks, MD 10/28/18 [phone]

## 2018-10-28 NOTE — ED Notes (Signed)
This RN and NT tried to ambulated pt with walker.  We were able to help pt to standing x2 assist, but pt was unable to walk across room in the ED.  Pt states she is home alone most the day, and the walker is easier to use on carpet.  But with this ambulation attempt pt was unable to place feet flat on the floor.  She is able to bear her weight once standing, but she is unable to get to a standing position on her own at this time.

## 2018-10-28 NOTE — ED Notes (Signed)
Bed: WA06 Expected date:  Expected time:  Means of arrival:  Comments: 52yo constipation x 4 days, had BM today which caused near syncope

## 2018-10-28 NOTE — ED Notes (Signed)
PT PLACED ON [place].

## 2018-10-28 NOTE — ED Provider Notes (Signed)
[facility] DEPT Provider Note   CSN: [id number] Arrival date & time: 10/28/18  1247     History   Chief Complaint Chief Complaint  Patient presents with  . generalized weakness    HPI [name] is a 53 y.o. female.  HPI 53 year old female presents with generalized weakness and near syncope.  This morning she was having a bowel movement and while on the toilet and after the bowel movement she developed dizziness and near syncope.  Her legs feel weaker than normal.  She has had constipation for the last couple days but did end up going this morning.  She has not noticed any urinary symptoms, fever, cough, chest pain or vomiting.  Past Medical History:  Diagnosis Date  . Movement disorder   . Multiple sclerosis [facility])     Patient Active Problem List   Diagnosis Date Noted  . High risk medication use 10/21/2016  . Urinary frequency 04/22/2016  . Multiple sclerosis ([place]) 02/01/2015  . Gait disturbance 02/01/2015  . Left foot drop 02/01/2015  . Other fatigue 02/01/2015  . DS (disseminated sclerosis) ([place]) 01/06/2013  . Avitaminosis D 01/06/2013    Past Surgical History:  Procedure Laterality Date  . CESAREAN SECTION    . UTERINE FIBROID SURGERY    . WISDOM TOOTH EXTRACTION       OB History   No obstetric history on file.      Home Medications    Prior to Admission medications   Medication Sig Start Date End Date Taking? Authorizing Provider  CVS B-12 500 MCG tablet TAKE 2 TABS BY MOUTH DAILY Patient taking differently: Take 1,000 mcg by mouth daily.  03/17/18  Yes Sater, Nanine Means, MD  CVS D3 50 MCG (2000 UT) CAPS TAKE 1 CAPSULE BY MOUTH DAILY OR AS DIRECTED Patient taking differently: Take 1 capsule by mouth daily.  10/15/18  Yes Sater, Nanine Means, MD  Misc Natural Products (TURMERIC CURCUMIN) CAPS Take by mouth 2 (two) times daily.   Yes [provider]  progesterone (PROMETRIUM) 100 MG capsule Take 100 mg by mouth at  bedtime.  04/08/18  Yes [provider]  TECFIDERA 240 MG CPDR TAKE 1 CAPSULE (240 MG) BY MOUTH TWICE DAILY Patient taking differently: Take 240 mg by mouth 2 (two) times daily.  02/10/18  Yes Sater, Nanine Means, MD  diphenhydrAMINE (BENADRYL) 25 MG tablet Take 2 tablets (50 mg total) by mouth every 6 (six) hours. Patient not taking: Reported on 10/28/2018 01/03/12 04/23/17  Carylon Perches, PA-C  modafinil (PROVIGIL) 200 MG tablet Take 1 tablet (200 mg total) by mouth daily. In the morning Patient not taking: Reported on 05/31/2018 11/19/17   Britt Bottom, MD    Family History Family History  Problem Relation Age of Onset  . Hypertension Mother   . Hypertension Father   . Diabetes Father     Social History Social History   Tobacco Use  . Smoking status: Never Smoker  . Smokeless tobacco: Never Used  Substance Use Topics  . Alcohol use: No  . Drug use: No     Allergies   Food; Oxycodone hcl; and Soy allergy   Review of Systems Review of Systems  Constitutional: Negative for fever.  Respiratory: Negative for cough and shortness of breath.   Cardiovascular: Negative for chest pain.  Gastrointestinal: Positive for constipation. Negative for abdominal pain and vomiting.  Genitourinary: Negative for dysuria and frequency.  Neurological: Positive for weakness and light-headedness. Negative for  syncope and headaches.  All other systems reviewed and are negative.    Physical Exam Updated Vital Signs BP (!) 148/75   Pulse (!) 110   Temp 99.4 F (37.4 C) (Rectal)   Resp 20   Ht 5\' 6"  (1.676 m)   Wt 122.5 kg   SpO2 100%   BMI 43.58 kg/m   Physical Exam Vitals signs and nursing note reviewed.  Constitutional:      Appearance: She is well-developed. She is obese.  HENT:     Head: Normocephalic and atraumatic.     Right Ear: External ear normal.     Left Ear: External ear normal.     Nose: Nose normal.  Eyes:     General:        Right eye: No discharge.         Left eye: No discharge.  Cardiovascular:     Rate and Rhythm: Regular rhythm. Tachycardia present.     Heart sounds: Normal heart sounds.  Pulmonary:     Effort: Pulmonary effort is normal.     Breath sounds: Normal breath sounds.  Abdominal:     General: There is no distension.     Palpations: Abdomen is soft.     Tenderness: There is no abdominal tenderness.  Skin:    General: Skin is warm and dry.  Neurological:     Mental Status: She is alert.     Comments: Patient is able to move legs minimally side to side on the stretcher against no resistance, worse than typical for her  Psychiatric:        Mood and Affect: Mood is not anxious.      ED Treatments / Results  Labs (all labs ordered are listed, but only abnormal results are displayed) Labs Reviewed  COMPREHENSIVE METABOLIC PANEL - Abnormal; Notable for the following components:      Result Value   Glucose, Bld 115 (*)    Total Protein 8.8 (*)    All other components within normal limits  CBC WITH DIFFERENTIAL/PLATELET - Abnormal; Notable for the following components:   WBC 10.9 (*)    Neutro Abs 10.0 (*)    Lymphs Abs 0.5 (*)    All other components within normal limits  URINALYSIS, ROUTINE W REFLEX MICROSCOPIC - Abnormal; Notable for the following components:   Hgb urine dipstick MODERATE (*)    Nitrite POSITIVE (*)    Leukocytes,Ua SMALL (*)    Bacteria, UA RARE (*)    All other components within normal limits  URINE CULTURE  LACTIC ACID, PLASMA  TROPONIN I  LACTIC ACID, PLASMA    EKG EKG Interpretation  Date/Time:  Thursday October 28 2018 13:13:04 EST Ventricular Rate:  114 PR Interval:    QRS Duration: 76 QT Interval:  398 QTC Calculation: 549 R Axis:   62 Text Interpretation:  Sinus tachycardia Probable left atrial enlargement Borderline T abnormalities, anterior leads Prolonged QT interval rate is faster compared to 2013 Confirmed by Sherwood Gambler [phone]) on 10/28/2018 2:34:05  PM   Radiology Dg Chest Portable 1 View  Result Date: 10/28/2018 CLINICAL DATA:  Dizziness, presyncopal episode during defecation. EXAM: PORTABLE CHEST 1 VIEW COMPARISON:  08/06/2005 FINDINGS: Cardiomediastinal silhouette is normal. Mediastinal contours appear intact. There is no evidence of focal airspace consolidation, pleural effusion or pneumothorax. Osseous structures are without acute abnormality. Soft tissues are grossly normal. IMPRESSION: No active disease. Electronically Signed   By: Fidela Salisbury M.D.   On: 10/28/2018 13:53  Procedures Procedures (including critical care time)  Angiocath insertion Performed by: Ephraim Hamburger  Consent: Verbal consent obtained. Risks and benefits: risks, benefits and alternatives were discussed Time out: Immediately prior to procedure a "time out" was called to verify the correct patient, procedure, equipment, support staff and site/side marked as required.  Preparation: Patient was prepped and draped in the usual sterile fashion.  Vein Location: right upper arm  Ultrasound Guided  Gauge: 20  Normal blood return and flush without difficulty Patient tolerance: Patient tolerated the procedure well with no immediate complications.    Medications Ordered in ED Medications  cefTRIAXone (ROCEPHIN) 1 g in sodium chloride 0.9 % 100 mL IVPB (1 g Intravenous New Bag/Given 10/28/18 1618)  lactated ringers bolus 1,000 mL (1,000 mLs Intravenous New Bag/Given 10/28/18 1511)     Initial Impression / Assessment and Plan / ED Course  I have reviewed the triage vital signs and the nursing notes.  Pertinent labs & imaging results that were available during my care of the patient were reviewed by me and considered in my medical decision making (see chart for details).     Patient's near syncope while on the toilet was probably vasovagal given the constipation.  However she is noted to be tachycardic currently with low-grade temperature of  99.4.  Given her history of MS and generalized weakness, infectious source is being worked up and it does appear that she has an acute urinary tract infection.  She will be given IV fluids and IV Rocephin.  After this, she will need to be ambulated and if she is able to ambulate at her baseline with walker, she can probably go home.  If not, she will probably need admission for further supportive care.  Care transferred to Dr. Zoe Lan.  Final Clinical Impressions(s) / ED Diagnoses   Final diagnoses:  Acute urinary tract infection    ED Discharge Orders    None       Sherwood Gambler, MD 10/28/18 1623

## 2018-10-28 NOTE — H&P (Signed)
History and Physical   [name] LOV:[id number] DOB: 12-11-1965 DOA: 10/28/2018  Referring MD/NP/PA: Dr. Johnney Killian  PCP: Glendale Chard, MD   Patient coming from: Home  Chief Complaint: Weakness  HPI: [name] is a 53 y.o. female with medical history significant of multiple sclerosis, morbid obesity, left foot drop, gait disturbance who came from home secondary to generalized weakness today.  Patient was unable to get up or walk.  She also was constipated and was having difficulty having a bowel movement.  She finally managed to have a bowel movement prior to coming in and the weakness got worse.  She was seen and evaluated in the ER.  She has global weakness with no focal weakness.  She does not appear to have a flareup of this multiple sclerosis at this point.  Patient was found to have UTI.  Due to her relative immune compromised status and her inability to walk on her feet she is being admitted to the hospital for treatment.  Patient denied any significant fever or chills.  No nausea or vomiting.  She has been taking her medications for multiple sclerosis consistently..  ED Course: Temperature is 99.4, blood pressure 149/83, pulse 113, respirate of 28 and oxygen sat 97% on room air.  White count is 10.9 otherwise the rest of the CBC and chemistry appeared to be within normal.  Urinalysis showed moderate hemoglobin nitrite positive and WBC 6-10.  She has rare bacteria.  Chest x-ray showed no active findings.  Patient was initiated on IV antibiotics and is being admitted to the hospital for treatment.  Review of Systems: As per HPI otherwise 10 point review of systems negative.    Past Medical History:  Diagnosis Date  . Movement disorder   . Multiple sclerosis ([place])     Past Surgical History:  Procedure Laterality Date  . CESAREAN SECTION    . UTERINE FIBROID SURGERY    . WISDOM TOOTH EXTRACTION       reports that she has never smoked. She has never used smokeless  tobacco. She reports that she does not drink alcohol or use drugs.  Allergies  Allergen Reactions  . Food Other (See Comments)    Eyes swelling and throat swelling  . Oxycodone Hcl Hives  . Soy Allergy     Other reaction(s): SWELLING    Family History  Problem Relation Age of Onset  . Hypertension Mother   . Hypertension Father   . Diabetes Father      Prior to Admission medications   Medication Sig Start Date End Date Taking? Authorizing Provider  [facility] B-12 500 MCG tablet TAKE 2 TABS BY MOUTH DAILY Patient taking differently: Take 1,000 mcg by mouth daily.  03/17/18  Yes Sater, Nanine Means, MD  [facility] D3 50 MCG (2000 UT) CAPS TAKE 1 CAPSULE BY MOUTH DAILY OR AS DIRECTED Patient taking differently: Take 1 capsule by mouth daily.  10/15/18  Yes Sater, Nanine Means, MD  Misc Natural Products (TURMERIC CURCUMIN) CAPS Take by mouth 2 (two) times daily.   Yes [provider]  progesterone (PROMETRIUM) 100 MG capsule Take 100 mg by mouth at bedtime.  04/08/18  Yes [provider]  TECFIDERA 240 MG CPDR TAKE 1 CAPSULE (240 MG) BY MOUTH TWICE DAILY Patient taking differently: Take 240 mg by mouth 2 (two) times daily.  02/10/18  Yes Sater, Nanine Means, MD  diphenhydrAMINE (BENADRYL) 25 MG tablet Take 2 tablets (50 mg total) by mouth every 6 (six) hours.  Patient not taking: Reported on 10/28/2018 01/03/12 04/23/17  Carylon Perches, PA-C  modafinil (PROVIGIL) 200 MG tablet Take 1 tablet (200 mg total) by mouth daily. In the morning Patient not taking: Reported on 05/31/2018 11/19/17   Britt Bottom, MD    Physical Exam: Vitals:   10/28/18 1330 10/28/18 1332 10/28/18 1619 10/28/18 1630  BP: 133/75  (!) 148/75 (!) 143/75  Pulse: (!) 110  (!) 110 (!) 106  Resp: (!) 28  20 (!) 21  Temp:  99.4 F (37.4 C)    TempSrc:  Rectal    SpO2: 97%  100% 100%  Weight:      Height:          Constitutional: NAD, calm, comfortable, morbidly obese Vitals:   10/28/18 1330 10/28/18 1332 10/28/18  1619 10/28/18 1630  BP: 133/75  (!) 148/75 (!) 143/75  Pulse: (!) 110  (!) 110 (!) 106  Resp: (!) 28  20 (!) 21  Temp:  99.4 F (37.4 C)    TempSrc:  Rectal    SpO2: 97%  100% 100%  Weight:      Height:       Eyes: PERRL, lids and conjunctivae normal ENMT: Mucous membranes are moist. Posterior pharynx clear of any exudate or lesions.Normal dentition.  Neck: normal, supple, no masses, no thyromegaly Respiratory: clear to auscultation bilaterally, no wheezing, no crackles. Normal respiratory effort. No accessory muscle use.  Cardiovascular: Regular rate and rhythm, no murmurs / rubs / gallops. No extremity edema. 2+ pedal pulses. No carotid bruits.  Abdomen: no tenderness, no masses palpated. No hepatosplenomegaly. Bowel sounds positive.  Musculoskeletal: no clubbing / cyanosis. No joint deformity upper and lower extremities. Good ROM, no contractures. Normal muscle tone.  Skin: no rashes, lesions, ulcers. No induration Neurologic: CN 2-12 grossly intact. Sensation intact, DTR normal. Strength 4/5 in all 4.  Psychiatric: Normal judgment and insight. Alert and oriented x 3. Normal mood.     Labs on Admission: I have personally reviewed following labs and imaging studies  CBC: Recent Labs  Lab 10/28/18 1510  WBC 10.9*  NEUTROABS 10.0*  HGB 14.7  HCT 44.4  MCV 89.5  PLT 814   Basic Metabolic Panel: Recent Labs  Lab 10/28/18 1510  NA 137  K 4.4  CL 103  CO2 24  GLUCOSE 115*  BUN 13  CREATININE 0.83  CALCIUM 9.0   GFR: Estimated Creatinine Clearance: 105.9 mL/min (by C-G formula based on SCr of 0.83 mg/dL). Liver Function Tests: Recent Labs  Lab 10/28/18 1510  AST 25  ALT 31  ALKPHOS 62  BILITOT 0.7  PROT 8.8*  ALBUMIN 4.8   No results for input(s): LIPASE, AMYLASE in the last 168 hours. No results for input(s): AMMONIA in the last 168 hours. Coagulation Profile: No results for input(s): INR, PROTIME in the last 168 hours. Cardiac Enzymes: Recent Labs    Lab 10/28/18 1510  TROPONINI <0.03   BNP (last 3 results) No results for input(s): PROBNP in the last 8760 hours. HbA1C: No results for input(s): HGBA1C in the last 72 hours. CBG: No results for input(s): GLUCAP in the last 168 hours. Lipid Profile: No results for input(s): CHOL, HDL, LDLCALC, TRIG, CHOLHDL, LDLDIRECT in the last 72 hours. Thyroid Function Tests: No results for input(s): TSH, T4TOTAL, FREET4, T3FREE, THYROIDAB in the last 72 hours. Anemia Panel: No results for input(s): VITAMINB12, FOLATE, FERRITIN, TIBC, IRON, RETICCTPCT in the last 72 hours. Urine analysis:    Component Value Date/Time  COLORURINE YELLOW 10/28/2018 [place] 10/28/2018 1335   LABSPEC 1.006 10/28/2018 1335   PHURINE 6.0 10/28/2018 1335   GLUCOSEU NEGATIVE 10/28/2018 1335   HGBUR MODERATE (A) 10/28/2018 1335   BILIRUBINUR NEGATIVE 10/28/2018 1335   KETONESUR NEGATIVE 10/28/2018 1335   PROTEINUR NEGATIVE 10/28/2018 1335   NITRITE POSITIVE (A) 10/28/2018 1335   LEUKOCYTESUR SMALL (A) 10/28/2018 1335   Sepsis Labs: @LABRCNTIP (procalcitonin:4,lacticidven:4) )No results found for this or any previous visit (from the past 240 hour(s)).   Radiological Exams on Admission: Dg Chest Portable 1 View  Result Date: 10/28/2018 CLINICAL DATA:  Dizziness, presyncopal episode during defecation. EXAM: PORTABLE CHEST 1 VIEW COMPARISON:  08/06/2005 FINDINGS: Cardiomediastinal silhouette is normal. Mediastinal contours appear intact. There is no evidence of focal airspace consolidation, pleural effusion or pneumothorax. Osseous structures are without acute abnormality. Soft tissues are grossly normal. IMPRESSION: No active disease. Electronically Signed   By: Fidela Salisbury M.D.   On: 10/28/2018 13:53    EKG: Independently reviewed. It shows Sinus tachycardia with rate of 112 with no significant ST changes.  Assessment/Plan Principal Problem:   UTI (urinary tract infection) Active  Problems:   DS (disseminated sclerosis) (HCC)   Multiple sclerosis (H[facility])   Other fatigue     #1. UTI: Will admit and initiate IV Rocephin.  Follow urine and blood culture results.  Patient is relatively immunocompromised.  If she becomes more symptomatic we will broaden antibiotics.  #2 generalized weakness and fatigue: Could be related to the UTI or patient's multiple sclerosis.  Continue with her home regimen.  PT OT consult in the morning.  #3 multiple sclerosis: No evidence of flareups.  I will continue with her standard home regimen at this point.   DVT prophylaxis: Lovenox Code Status: Full code Family Communication: Patient has no family at bedside Disposition Plan: Home Consults called: None Admission status: Inpatient  Severity of Illness: The appropriate patient status for this patient is INPATIENT. Inpatient status is judged to be reasonable and necessary in order to provide the required intensity of service to ensure the patient's safety. The patient's presenting symptoms, physical exam findings, and initial radiographic and laboratory data in the context of their chronic comorbidities is felt to place them at high risk for further clinical deterioration. Furthermore, it is not anticipated that the patient will be medically stable for discharge from the hospital within 2 midnights of admission. The following factors support the patient status of inpatient.   " The patient's presenting symptoms include generalized weakness. " The worrisome physical exam findings include global weakness with no significant focal findings. " The initial radiographic and laboratory data are worrisome because of evidence of UTI. " The chronic co-morbidities include multiple sclerosis.   * I certify that at the point of admission it is my clinical judgment that the patient will require inpatient hospital care spanning beyond 2 midnights from the point of admission due to high intensity of service,  high risk for further deterioration and high frequency of surveillance required.Barbette Merino MD [facility] Pager [phone]-  If 7PM-7AM, please contact night-coverage [email or web address] Password [facility]  10/28/2018, 7:57 PM

## 2018-10-28 NOTE — ED Notes (Signed)
ED TO INPATIENT HANDOFF REPORT  Name/Age/Gender [name] 53 y.o. female  Code Status    Code Status Orders  (From admission, onward)         Start     Ordered   10/28/18 2012  Full code  Continuous     10/28/18 2011        Code Status History    This patient has a current code status but no historical code status.      Home/SNF/Other Home  Chief Complaint Abdominal pain   Level of Care/Admitting Diagnosis ED Disposition    ED Disposition Condition Comment   Admit  Hospital Area: [facility] [[id number]]  Level of Care: Med-Surg [16]  Diagnosis: UTI (urinary tract infection) [350093]  Admitting Physician: Elwyn Reach [[id number]]  Attending Physician: Elwyn Reach [[id number]]  Estimated length of stay: past midnight tomorrow  Certification:: I certify this patient will need inpatient services for at least 2 midnights  PT Class (Do Not Modify): Inpatient [101]  PT Acc Code (Do Not Modify): Private [1]       Medical History Past Medical History:  Diagnosis Date  . Movement disorder   . Multiple sclerosis (HCC)     Allergies Allergies  Allergen Reactions  . Food Other (See Comments)    Eyes swelling and throat swelling  . Oxycodone Hcl Hives  . Soy Allergy     Other reaction(s): SWELLING    IV Location/Drains/Wounds Patient Lines/Drains/Airways Status   Active Line/Drains/Airways    Name:   Placement date:   Placement time:   Site:   Days:   Peripheral IV 10/28/18 Right;Upper Arm   10/28/18    1506    Arm   less than 1          Labs/Imaging Results for orders placed or performed during the hospital encounter of 10/28/18 (from the past 48 hour(s))  Urinalysis, Routine w reflex microscopic     Status: Abnormal   Collection Time: 10/28/18  1:35 PM  Result Value Ref Range   Color, Urine YELLOW YELLOW   APPearance CLEAR CLEAR   Specific Gravity, Urine 1.006 1.005 - 1.030   pH 6.0 5.0 - 8.0   Glucose, UA NEGATIVE  NEGATIVE mg/dL   Hgb urine dipstick MODERATE (A) NEGATIVE   Bilirubin Urine NEGATIVE NEGATIVE   Ketones, ur NEGATIVE NEGATIVE mg/dL   Protein, ur NEGATIVE NEGATIVE mg/dL   Nitrite POSITIVE (A) NEGATIVE   Leukocytes,Ua SMALL (A) NEGATIVE   RBC / HPF 0-5 0 - 5 RBC/hpf   WBC, UA 6-10 0 - 5 WBC/hpf   Bacteria, UA RARE (A) NONE SEEN   Squamous Epithelial / LPF 0-5 0 - 5   Mucus PRESENT     Comment: Performed at [facility], [place] [address] [place], [place] [address]  Comprehensive metabolic panel     Status: Abnormal   Collection Time: 10/28/18  3:10 PM  Result Value Ref Range   Sodium 137 135 - 145 mmol/L   Potassium 4.4 3.5 - 5.1 mmol/L   Chloride 103 98 - 111 mmol/L   CO2 24 22 - 32 mmol/L   Glucose, Bld 115 (H) 70 - 99 mg/dL   BUN 13 6 - 20 mg/dL   Creatinine, Ser 0.83 0.44 - 1.00 mg/dL   Calcium 9.0 8.9 - 10.3 mg/dL   Total Protein 8.8 (H) 6.5 - 8.1 g/dL   Albumin 4.8 3.5 - 5.0 g/dL   AST 25 15 -  41 U/L   ALT 31 0 - 44 U/L   Alkaline Phosphatase 62 38 - 126 U/L   Total Bilirubin 0.7 0.3 - 1.2 mg/dL   GFR calc non Af Amer >60 >60 mL/min   GFR calc Af Amer >60 >60 mL/min   Anion gap 10 5 - 15    Comment: Performed at [facility], [place] [address], [place], [place] [address]  CBC with Differential     Status: Abnormal   Collection Time: 10/28/18  3:10 PM  Result Value Ref Range   WBC 10.9 (H) 4.0 - 10.5 K/uL   RBC 4.96 3.87 - 5.11 MIL/uL   Hemoglobin 14.7 12.0 - 15.0 g/dL   HCT 44.4 36.0 - 46.0 %   MCV 89.5 80.0 - 100.0 fL   MCH 29.6 26.0 - 34.0 pg   MCHC 33.1 30.0 - 36.0 g/dL   RDW 13.5 11.5 - 15.5 %   Platelets 324 150 - 400 K/uL   nRBC 0.0 0.0 - 0.2 %   Neutrophils Relative % 92 %   Neutro Abs 10.0 (H) 1.7 - 7.7 K/uL   Lymphocytes Relative 5 %   Lymphs Abs 0.5 (L) 0.7 - 4.0 K/uL   Monocytes Relative 3 %   Monocytes Absolute 0.3 0.1 - 1.0 K/uL   Eosinophils Relative 0 %   Eosinophils Absolute 0.0 0.0 - 0.5 K/uL   Basophils  Relative 0 %   Basophils Absolute 0.0 0.0 - 0.1 K/uL   Immature Granulocytes 0 %   Abs Immature Granulocytes 0.03 0.00 - 0.07 K/uL    Comment: Performed at [facility], [place] [address] [place], [place] [address]  Lactic acid, plasma     Status: None   Collection Time: 10/28/18  3:10 PM  Result Value Ref Range   Lactic Acid, Venous 1.5 0.5 - 1.9 mmol/L    Comment: Performed at [facility], [place] [address], [place], [place] [address]  Troponin I - Once     Status: None   Collection Time: 10/28/18  3:10 PM  Result Value Ref Range   Troponin I <0.03 <0.03 ng/mL    Comment: Performed at [facility], [place] [address], [place], [place] [address]   Dg Chest Portable 1 View  Result Date: 10/28/2018 CLINICAL DATA:  Dizziness, presyncopal episode during defecation. EXAM: PORTABLE CHEST 1 VIEW COMPARISON:  08/06/2005 FINDINGS: Cardiomediastinal silhouette is normal. Mediastinal contours appear intact. There is no evidence of focal airspace consolidation, pleural effusion or pneumothorax. Osseous structures are without acute abnormality. Soft tissues are grossly normal. IMPRESSION: No active disease. Electronically Signed   By: Fidela Salisbury M.D.   On: 10/28/2018 13:53   EKG Interpretation  Date/Time:  Thursday October 28 2018 13:13:04 EST Ventricular Rate:  114 PR Interval:    QRS Duration: 76 QT Interval:  398 QTC Calculation: 549 R Axis:   62 Text Interpretation:  Sinus tachycardia Probable left atrial enlargement Borderline T abnormalities, anterior leads Prolonged QT interval rate is faster compared to 2013 Confirmed by Sherwood Gambler [phone]) on 10/28/2018 2:34:05 PM   Pending Labs Unresulted Labs (From admission, onward)    Start     Ordered   10/29/18 0500  Comprehensive metabolic panel  Tomorrow morning,   R     10/28/18 2011   10/29/18 0500  CBC  Tomorrow morning,   R     10/28/18 2011   10/28/18 2012  HIV antibody  (Routine Testing)  Once,  R     10/28/18 2011   10/28/18 2012  CBC  (heparin)  Once,   R    Comments:  Baseline for heparin therapy IF NOT ALREADY DRAWN.  Notify MD if PLT < 100 K.    10/28/18 2011   10/28/18 2012  Creatinine, serum  (heparin)  Once,   R    Comments:  Baseline for heparin therapy IF NOT ALREADY DRAWN.    10/28/18 2011   10/28/18 2012  Urine culture  Once,   R     10/28/18 2011   10/28/18 1335  Urine culture  ONCE - STAT,   STAT     10/28/18 1334   10/28/18 1334  Lactic acid, plasma  Now then every 2 hours,   STAT     10/28/18 1334          Vitals/Pain Today's Vitals   10/28/18 1330 10/28/18 1332 10/28/18 1619 10/28/18 1630  BP: 133/75  (!) 148/75 (!) 143/75  Pulse: (!) 110  (!) 110 (!) 106  Resp: (!) 28  20 (!) 21  Temp:  99.4 F (37.4 C)    TempSrc:  Rectal    SpO2: 97%  100% 100%  Weight:      Height:      PainSc:        Isolation Precautions No active isolations  Medications Medications  diphenhydrAMINE (BENADRYL) tablet 50 mg (has no administration in time range)  Dimethyl Fumarate CPDR 240 mg (has no administration in time range)  vitamin B-12 (CYANOCOBALAMIN) tablet 1,000 mcg (has no administration in time range)  progesterone (PROMETRIUM) capsule 100 mg (has no administration in time range)  Turmeric Curcumin CAPS (has no administration in time range)  Cholecalciferol CAPS 2,000 Units (has no administration in time range)  heparin injection 5,000 Units (has no administration in time range)  dextrose 5 %-0.45 % sodium chloride infusion (has no administration in time range)  ondansetron (ZOFRAN) tablet 4 mg (has no administration in time range)    Or  ondansetron (ZOFRAN) injection 4 mg (has no administration in time range)  cefTRIAXone (ROCEPHIN) 1 g in sodium chloride 0.9 % 100 mL IVPB (has no administration in time range)  lactated ringers bolus 1,000 mL (0 mLs Intravenous Stopped 10/28/18 1612)  cefTRIAXone (ROCEPHIN) 1 g in sodium  chloride 0.9 % 100 mL IVPB (0 g Intravenous Stopped 10/28/18 1828)    Mobility walks with device

## 2018-10-29 DIAGNOSIS — N390 Urinary tract infection, site not specified: Principal | ICD-10-CM

## 2018-10-29 LAB — CBC
HCT: 40.6 % (ref 36.0–46.0)
Hemoglobin: 13.3 g/dL (ref 12.0–15.0)
MCH: 29.6 pg (ref 26.0–34.0)
MCHC: 32.8 g/dL (ref 30.0–36.0)
MCV: 90.2 fL (ref 80.0–100.0)
Platelets: 245 10*3/uL (ref 150–400)
RBC: 4.5 MIL/uL (ref 3.87–5.11)
RDW: 13.7 % (ref 11.5–15.5)
WBC: 8 10*3/uL (ref 4.0–10.5)
nRBC: 0 % (ref 0.0–0.2)

## 2018-10-29 LAB — URINE CULTURE: Culture: >=100000 — AB

## 2018-10-29 LAB — HIV ANTIBODY (ROUTINE TESTING W REFLEX): HIV Screen 4th Generation wRfx: NONREACTIVE

## 2018-10-29 LAB — COMPREHENSIVE METABOLIC PANEL
ALT: 27 U/L (ref 0–44)
AST: 18 U/L (ref 15–41)
Albumin: 3.7 g/dL (ref 3.5–5.0)
Alkaline Phosphatase: 43 U/L (ref 38–126)
Anion gap: 10 (ref 5–15)
BUN: 10 mg/dL (ref 6–20)
CO2: 20 mmol/L — ABNORMAL LOW (ref 22–32)
Calcium: 8.3 mg/dL — ABNORMAL LOW (ref 8.9–10.3)
Chloride: 105 mmol/L (ref 98–111)
Creatinine, Ser: 0.66 mg/dL (ref 0.44–1.00)
GFR calc Af Amer: >60 mL/min (ref >60)
GFR calc non Af Amer: >60 mL/min (ref >60)
Glucose, Bld: 114 mg/dL — ABNORMAL HIGH (ref 70–99)
Potassium: 3.5 mmol/L (ref 3.5–5.1)
Sodium: 135 mmol/L (ref 135–145)
Total Bilirubin: 0.6 mg/dL (ref 0.3–1.2)
Total Protein: 7.2 g/dL (ref 6.5–8.1)

## 2018-10-29 MED ORDER — POTASSIUM CHLORIDE CRYS ER 20 MEQ PO TBCR
40.0000 meq | EXTENDED_RELEASE_TABLET | Freq: Once | ORAL | Status: AC
  Administered 2018-10-29: 40 meq via ORAL
  Filled 2018-10-29: qty 2

## 2018-10-29 NOTE — Progress Notes (Signed)
PROGRESS NOTE    [name]  WNU:[id number] DOB: October 17, 1965 DOA: 10/28/2018 PCP: Glendale Chard, MD    Brief Narrative:  53 y.o. female with medical history significant of multiple sclerosis, morbid obesity, left foot drop, gait disturbance who came from home secondary to generalized weakness today.  Patient was unable to get up or walk.  She also was constipated and was having difficulty having a bowel movement.  She finally managed to have a bowel movement prior to coming in and the weakness got worse.  She was seen and evaluated in the ER.  She has global weakness with no focal weakness.  She does not appear to have a flareup of this multiple sclerosis at this point.  Patient was found to have UTI.  Due to her relative immune compromised status and her inability to walk on her feet she is being admitted to the hospital for treatment.  Patient denied any significant fever or chills.  No nausea or vomiting.  She has been taking her medications for multiple sclerosis consistently..  ED Course: Temperature is 99.4, blood pressure 149/83, pulse 113, respirate of 28 and oxygen sat 97% on room air.  White count is 10.9 otherwise the rest of the CBC and chemistry appeared to be within normal.  Urinalysis showed moderate hemoglobin nitrite positive and WBC 6-10.  She has rare bacteria.  Chest x-ray showed no active findings.  Patient was initiated on IV antibiotics and is being admitted to the hospital for treatment.  Assessment & Plan:   Principal Problem:   UTI (urinary tract infection) Active Problems:   DS (disseminated sclerosis) (HCC)   Multiple sclerosis (HCC)   Other fatigue  #1 generalized weakness in a patient with history of MS at baseline she ambulates at home with a walker and lives at home with the family.  She has not gotten out of bed since admission to the hospital.  Will consult PT.  Continue B12 vitamin D3.  #2 MS with no evidence of flareup patient followed as an outpatient  with neurology with no recent hospital admission for flareups.  #3 possible UTI on antibiotics as patient is immunocompromised urine culture pending continue Rocephin.   Estimated body mass index is 43.58 kg/m as calculated from the following:   Height as of this encounter: 5\' 6"  (1.676 m).   Weight as of this encounter: 122.5 kg.  DVT prophylaxis: Lovenox Code Status full code Family Communication: No family at bedside Disposition Plan: Pending clinical improvement and PT evaluation urine culture and blood culture  Consultants: None  Procedures: None Antimicrobials: Rocephin Subjective: Resting in bed complains of generalized weakness cannot get out of bed because of weakness.  Denies any nausea vomiting diarrhea  Objective: Vitals:   10/28/18 1619 10/28/18 1630 10/28/18 2040 10/29/18 0633  BP: (!) 148/75 (!) 143/75 (!) 149/83 130/70  Pulse: (!) 110 (!) 106 (!) 104 98  Resp: 20 (!) 21 20 16   Temp:   98.1 F (36.7 C) 98.2 F (36.8 C)  TempSrc:   Oral Oral  SpO2: 100% 100% 99% 98%  Weight:      Height:        Intake/Output Summary (Last 24 hours) at 10/29/2018 0949 Last data filed at 10/29/2018 0358 Gross per 24 hour  Intake 1465.78 ml  Output -  Net 1465.78 ml   Filed Weights   10/28/18 1308  Weight: 122.5 kg    Examination:  General exam: Appears calm and comfortable  Respiratory system: Clear to  auscultation. Respiratory effort normal. Cardiovascular system: S1 & S2 heard, RRR. No JVD, murmurs, rubs, gallops or clicks. No pedal edema. Gastrointestinal system: Abdomen is nondistended, soft and nontender. No organomegaly or masses felt. Normal bowel sounds heard. Central nervous system: Alert and oriented. No focal neurological deficits. Extremities: Symmetric 5 x 5 power. Skin: No rashes, lesions or ulcers Psychiatry: Judgement and insight appear normal. Mood & affect appropriate.     Data Reviewed: I have personally reviewed following labs and imaging  studies  CBC: Recent Labs  Lab 10/28/18 1510 10/29/18 0656  WBC 10.9* 8.0  NEUTROABS 10.0*  --   HGB 14.7 13.3  HCT 44.4 40.6  MCV 89.5 90.2  PLT 324 850   Basic Metabolic Panel: Recent Labs  Lab 10/28/18 1510 10/29/18 0656  NA 137 135  K 4.4 3.5  CL 103 105  CO2 24 20*  GLUCOSE 115* 114*  BUN 13 10  CREATININE 0.83 0.66  CALCIUM 9.0 8.3*   GFR: Estimated Creatinine Clearance: 109.9 mL/min (by C-G formula based on SCr of 0.66 mg/dL). Liver Function Tests: Recent Labs  Lab 10/28/18 1510 10/29/18 0656  AST 25 18  ALT 31 27  ALKPHOS 62 43  BILITOT 0.7 0.6  PROT 8.8* 7.2  ALBUMIN 4.8 3.7   No results for input(s): LIPASE, AMYLASE in the last 168 hours. No results for input(s): AMMONIA in the last 168 hours. Coagulation Profile: No results for input(s): INR, PROTIME in the last 168 hours. Cardiac Enzymes: Recent Labs  Lab 10/28/18 1510  TROPONINI <0.03   BNP (last 3 results) No results for input(s): PROBNP in the last 8760 hours. HbA1C: No results for input(s): HGBA1C in the last 72 hours. CBG: No results for input(s): GLUCAP in the last 168 hours. Lipid Profile: No results for input(s): CHOL, HDL, LDLCALC, TRIG, CHOLHDL, LDLDIRECT in the last 72 hours. Thyroid Function Tests: No results for input(s): TSH, T4TOTAL, FREET4, T3FREE, THYROIDAB in the last 72 hours. Anemia Panel: No results for input(s): VITAMINB12, FOLATE, FERRITIN, TIBC, IRON, RETICCTPCT in the last 72 hours. Sepsis Labs: Recent Labs  Lab 10/28/18 1510  LATICACIDVEN 1.5    No results found for this or any previous visit (from the past 240 hour(s)).       Radiology Studies: Dg Chest Portable 1 View  Result Date: 10/28/2018 CLINICAL DATA:  Dizziness, presyncopal episode during defecation. EXAM: PORTABLE CHEST 1 VIEW COMPARISON:  08/06/2005 FINDINGS: Cardiomediastinal silhouette is normal. Mediastinal contours appear intact. There is no evidence of focal airspace consolidation,  pleural effusion or pneumothorax. Osseous structures are without acute abnormality. Soft tissues are grossly normal. IMPRESSION: No active disease. Electronically Signed   By: Fidela Salisbury M.D.   On: 10/28/2018 13:53        Scheduled Meds: . cholecalciferol  2,000 Units Oral Daily  . Dimethyl Fumarate  240 mg Oral BID  . heparin  5,000 Units Subcutaneous Q8H  . progesterone  100 mg Oral QHS  . vitamin B-12  1,000 mcg Oral Daily   Continuous Infusions: . cefTRIAXone (ROCEPHIN)  IV    . dextrose 5 % and 0.45% NaCl 100 mL/hr at 10/29/18 0759     LOS: 1 day     Georgette Shell, MD [facility] If 7PM-7AM, please contact night-coverage [email or web address] Password [facility] 10/29/2018, 9:49 AM

## 2018-10-29 NOTE — Evaluation (Signed)
Physical Therapy Evaluation Patient Details Name: [name] MRN: [id number] DOB: 03-11-1966 Today's Date: 10/29/2018   History of Present Illness  53 yo female admitted to ED with UTI and generalized weakness/fatigue. Notable PMH includes MS, L foot drop.   Clinical Impression  Pt presents with increased LE tone L>R, fatigue with activity, difficulty performing all mobility tasks, and decreased tolerance for ambulation. Pt also with history of falls. Pt to benefit from acute PT to address deficits. Pt ambulated 2x8 ft to and from bathroom, with increased LE tone resulting in lack of knee flexion with gait and difficulty with positioning in RW. PT recommended SNF to pt to improve LE strength and endurance for activity, but pt states this is not an option given her family needing her at home. PT told pt to increase assist through family and friends if possible especially during the day, and recommending HHPT as an alternative. Pt expressing interest in OPPT in the future, but pt no longer drives so it is difficult for her to leave the house on her own. PT to progress mobility as tolerated, and will continue to follow acutely.      Follow Up Recommendations SNF;Supervision/Assistance - 24 hour;Home health PT(Pt states she has to go home to family, requesting HHPT as alternative to rehab. )    Equipment Recommendations  None recommended by PT    Recommendations for Other Services       Precautions / Restrictions Precautions Precautions: Fall Restrictions Weight Bearing Restrictions: No Other Position/Activity Restrictions: WBAT       Mobility  Bed Mobility Overal bed mobility: Needs Assistance Bed Mobility: Supine to Sit     Supine to sit: Mod assist;HOB elevated     General bed mobility comments: Mod assist for trunk elevation and LE lifting and translation to EOB. Pt able to scoot self to EOB with increased time and use of UEs to translate LEs forward.  Transfers Overall  transfer level: Needs assistance Equipment used: Rolling walker (2 wheeled) Transfers: Sit to/from Stand Sit to Stand: Min assist;From elevated surface         General transfer comment: Min assist for power up, steadying upon standing. Verbal cuing for hand placement. Sit to stand x2, once from toilet and once from bed   Ambulation/Gait Ambulation/Gait assistance: Min assist;+2 safety/equipment Gait Distance (Feet): 8 Feet(2x8 ft, to and from bathroom ) Assistive device: Rolling walker (2 wheeled) Gait Pattern/deviations: Step-through pattern;Decreased stride length;Trunk flexed;Wide base of support Gait velocity: decr    General Gait Details: Min assist for steadying during ambulation. Pt with wide BOS and hip and knee extension tone L>R. Pt unable to flex L knee during ambulation, slight L foot drop suspect due to tone.   Stairs            Wheelchair Mobility    Modified Rankin (Stroke Patients Only)       Balance Overall balance assessment: Needs assistance Sitting-balance support: No upper extremity supported;Feet supported Sitting balance-Leahy Scale: Fair     Standing balance support: Bilateral upper extremity supported Standing balance-Leahy Scale: Poor                               Pertinent Vitals/Pain Pain Assessment: 0-10 Pain Score: 0-No pain Pain Intervention(s): Monitored during session    Home Living Family/patient expects to be discharged to:: Private residence Living Arrangements: Spouse/significant other;Children Available Help at Discharge: Family;Available PRN/intermittently(Pt's husband works, pt's daughters  are gone during the day ) Type of Home: House Home Access: Stairs to enter Entrance Stairs-Rails: None Entrance Stairs-Number of Steps: 1 Home Layout: One level Home Equipment: Tub bench;Walker - 2 wheels;Cane - single point      Prior Function Level of Independence: Needs assistance   Gait / Transfers Assistance  Needed: Pt reports using RW for all mobility for the past 1-2 years  ADL's / Homemaking Assistance Needed: Pt reports she requires assist getting in and out of bed from husband, and most times just sleeps on the couch due to difficulty with bed mobility. at times needs assist from daughters to get in and out of bathtub. Pt reports she limits her activity due to fatiguability   Comments: Pt reports fall in the past year, where she just tripped and fell in home      Hand Dominance        Extremity/Trunk Assessment   Upper Extremity Assessment Upper Extremity Assessment: Overall WFL for tasks assessed;Generalized weakness    Lower Extremity Assessment Lower Extremity Assessment: Generalized weakness;RLE deficits/detail;LLE deficits/detail RLE Deficits / Details: 2/5 hip flexion strength at EOB, pt lifting LE with UEs. requires PT assist to move LE through hip and knee flexion/extension RLE Sensation: WNL LLE Deficits / Details: 2/5 hip flexion as above, extreme difficulty moving LE into hip flexion due to hypertonicity in hip extensors. Difficulty to assess strength due to increased tone. + clonus, ~7 beats.  LLE Sensation: WNL    Cervical / Trunk Assessment Cervical / Trunk Assessment: Normal  Communication   Communication: No difficulties  Cognition Arousal/Alertness: Awake/alert Behavior During Therapy: WFL for tasks assessed/performed Overall Cognitive Status: Within Functional Limits for tasks assessed                                        General Comments      Exercises     Assessment/Plan    PT Assessment Patient needs continued PT services  PT Problem List Decreased strength;Pain;Decreased range of motion;Decreased activity tolerance;Decreased knowledge of use of DME;Decreased balance;Decreased safety awareness;Decreased mobility       PT Treatment Interventions DME instruction;Therapeutic activities;Gait training;Therapeutic  exercise;Patient/family education;Balance training;Stair training;Functional mobility training    PT Goals (Current goals can be found in the Care Plan section)  Acute Rehab PT Goals Patient Stated Goal: get stronger  PT Goal Formulation: With patient Time For Goal Achievement: 11/12/18 Potential to Achieve Goals: Fair    Frequency Min 3X/week   Barriers to discharge        Co-evaluation               AM-PAC PT "6 Clicks" Mobility  Outcome Measure Help needed turning from your back to your side while in a flat bed without using bedrails?: A Lot Help needed moving from lying on your back to sitting on the side of a flat bed without using bedrails?: A Lot Help needed moving to and from a bed to a chair (including a wheelchair)?: A Lot Help needed standing up from a chair using your arms (e.g., wheelchair or bedside chair)?: A Little Help needed to walk in hospital room?: A Little Help needed climbing 3-5 steps with a railing? : A Lot 6 Click Score: 14    End of Session Equipment Utilized During Treatment: Gait belt Activity Tolerance: Patient tolerated treatment well;Patient limited by fatigue Patient left: in chair;with chair alarm  set;with call bell/phone within reach;with family/visitor present;with SCD's reapplied;with nursing/sitter in room Nurse Communication: Mobility status PT Visit Diagnosis: Other abnormalities of gait and mobility (R26.89);Difficulty in walking, not elsewhere classified (R26.2);Muscle weakness (generalized) (M62.81)    Time: 1146-4314 PT Time Calculation (min) (ACUTE ONLY): 37 min   Charges:   PT Evaluation $PT Eval Low Complexity: 1 Low PT Treatments $Gait Training: 8-22 mins        Julien Girt, PT Acute Rehabilitation Services Pager [phone]  Office [phone]   Roxine Caddy D Elonda Husky 10/29/2018, 6:26 PM

## 2018-10-30 MED ORDER — SACCHAROMYCES BOULARDII 250 MG PO CAPS: 250.0000 mg | ORAL_CAPSULE | Freq: Two times a day (BID) | ORAL | Status: DC

## 2018-10-30 MED ORDER — TRAZODONE HCL 50 MG PO TABS
50.0000 mg | ORAL_TABLET | Freq: Once | ORAL | Status: AC
  Administered 2018-10-30: 50 mg via ORAL
  Filled 2018-10-30: qty 1

## 2018-10-30 MED ORDER — ERYTHROMYCIN BASE 250 MG PO TABS
500.0000 mg | ORAL_TABLET | Freq: Four times a day (QID) | ORAL | Status: DC
  Administered 2018-10-30: 500 mg via ORAL
  Filled 2018-10-30 (×2): qty 2

## 2018-10-30 MED ORDER — SACCHAROMYCES BOULARDII 250 MG PO CAPS
250.0000 mg | ORAL_CAPSULE | Freq: Two times a day (BID) | ORAL | Status: DC
  Administered 2018-10-30: 250 mg via ORAL
  Filled 2018-10-30: qty 1

## 2018-10-30 MED ORDER — ERYTHROMYCIN BASE 500 MG PO TABS: 500.0000 mg | ORAL_TABLET | Freq: Four times a day (QID) | ORAL | 0 refills | Status: DC

## 2018-10-30 NOTE — Care Management Note (Signed)
Case Management Note  Patient Details  Name: [name] MRN: [id number] Date of Birth: May 06, 1966  Subjective/Objective:   MS, UTI                 Action/Plan:  Spoke to pt at bedside. Offered choice for HH/Medicare list provided and placed on chart. She has RW, stair lift and shower chair. Husband and children at home to assist with care. PTAR arranged. Contacted [facility] with new referral for [facility].   Expected Discharge Date:  10/30/18               Expected Discharge Plan:  [place]  In-House Referral:  NA  Discharge planning Services  CM Consult  Post Acute Care Choice:  Home Health Choice offered to:  Patient  DME Arranged:  N/A DME Agency:  NA  HH Arranged:  PT, Nurse's Aide, OT HH Agency:  [place]  Status of Service:  Completed, signed off  If discussed at [place] of Stay Meetings, dates discussed:    Additional Comments:  Erenest Rasher, RN 10/30/2018, 12:31 PM

## 2018-10-30 NOTE — Discharge Summary (Signed)
Physician Discharge Summary  [name] XFG:[id number] DOB: 03/23/1966 DOA: 10/28/2018  PCP: Glendale Chard, MD  Admit date: 10/28/2018 Discharge date: 10/30/2018  Admitted From: Home Disposition: Home  Recommendations for Outpatient Follow-up:  1. Follow up with PCP in 1-2 weeks 2. Please obtain BMP/CBC in one week   Home Health PT Equipment/Devices: None  Discharge Condition: Stable CODE STATUS full code Diet recommendation: Cardiac Brief/Interim Summary:53 y.o.femalewith medical history significant ofmultiple sclerosis, morbid obesity, left foot drop, gait disturbance who came from home secondary to generalized weakness today. Patient was unable to get up or walk. She also was constipated and was having difficulty having a bowel movement. She finally managed to have a bowel movement prior to coming in and the weakness got worse. She was seen and evaluated in the ER. She has global weakness with no focal weakness. She does not appear to have a flareup of this multiple sclerosis at this point. Patient was found to have UTI. Due to her relative immune compromised status and her inability to walk on her feet she is being admitted to the hospital for treatment. Patient denied any significant fever or chills. No nausea or vomiting. She has been taking her medications for multiple sclerosis consistently..  ED Course:Temperature is 99.4, blood pressure 149/83, pulse 113, respirate of 28 and oxygen sat 97% on room air. White count is 10.9 otherwise the rest of the CBC and chemistry appeared to be within normal. Urinalysis showed moderate hemoglobin nitrite positive and WBC 6-10. She has rare bacteria. Chest x-ray showed no active findings. Patient was initiated on IV antibiotics and is being admitted to the hospital for treatment.   Discharge Diagnoses:  Principal Problem:   Acute urinary tract infection Active Problems:   DS (disseminated sclerosis) (HCC)   Multiple  sclerosis (HCC)   Other fatigue   #1 generalized weakness in a patient with history of MS at baseline she ambulates at home with a walker and lives at home with the family.  PT recommends SNF patient refusing SNF.  She wants to go home with PT home health as she has a lot of family support.   #2 MS with no evidence of flareup patient followed as an outpatient with neurology with no recent hospital admission for flareups.  #3  UTI urine culture growing Corynebacterium will discharge patient on erythromycin 500 mg every 6 for 5 days.  Estimated body mass index is 43.58 kg/m as calculated from the following:   Height as of this encounter: 5\' 6"  (1.676 m).   Weight as of this encounter: 122.5 kg.  Discharge Instructions  Discharge Instructions    Call MD for:  difficulty breathing, headache or visual disturbances   Complete by:  As directed    Call MD for:  persistant dizziness or light-headedness   Complete by:  As directed    Call MD for:  persistant nausea and vomiting   Complete by:  As directed    Call MD for:  redness, tenderness, or signs of infection (pain, swelling, redness, odor or green/yellow discharge around incision site)   Complete by:  As directed    Diet - low sodium heart healthy   Complete by:  As directed    Increase activity slowly   Complete by:  As directed      Allergies as of 10/30/2018      Reactions   Food Other (See Comments)   Eyes swelling and throat swelling   Oxycodone Hcl Hives   Soy  Allergy    Other reaction(s): SWELLING      Medication List    STOP taking these medications   diphenhydrAMINE 25 MG tablet Commonly known as:  BENADRYL   modafinil 200 MG tablet Commonly known as:  PROVIGIL     TAKE these medications   [employer] B-12 500 MCG tablet Generic drug:  vitamin B-12 TAKE 2 TABS BY MOUTH DAILY What changed:  how much to take   CVS D3 50 MCG (2000 UT) Caps Generic drug:  Cholecalciferol TAKE 1 CAPSULE BY MOUTH DAILY OR AS  DIRECTED What changed:  See the new instructions.   erythromycin base 500 MG tablet Commonly known as:  E-MYCIN Take 1 tablet (500 mg total) by mouth every 6 (six) hours.   progesterone 100 MG capsule Commonly known as:  PROMETRIUM Take 100 mg by mouth at bedtime.   saccharomyces boulardii 250 MG capsule Commonly known as:  FLORASTOR Take 1 capsule (250 mg total) by mouth 2 (two) times daily.   TECFIDERA 240 MG Cpdr Generic drug:  Dimethyl Fumarate TAKE 1 CAPSULE (240 MG) BY MOUTH TWICE DAILY What changed:  See the new instructions.   Turmeric Curcumin Caps Take by mouth 2 (two) times daily.      Follow-up Information    Glendale Chard, MD Follow up.   Specialty:  Internal Medicine Contact information: [address] [place] [address] [phone]          Allergies  Allergen Reactions  . Food Other (See Comments)    Eyes swelling and throat swelling  . Oxycodone Hcl Hives  . Soy Allergy     Other reaction(s): SWELLING    Consultations: none  Procedures/Studies: Dg Chest Portable 1 View  Result Date: 10/28/2018 CLINICAL DATA:  Dizziness, presyncopal episode during defecation. EXAM: PORTABLE CHEST 1 VIEW COMPARISON:  08/06/2005 FINDINGS: Cardiomediastinal silhouette is normal. Mediastinal contours appear intact. There is no evidence of focal airspace consolidation, pleural effusion or pneumothorax. Osseous structures are without acute abnormality. Soft tissues are grossly normal. IMPRESSION: No active disease. Electronically Signed   By: Fidela Salisbury M.D.   On: 10/28/2018 13:53   (Echo, Carotid, EGD, Colonoscopy, ERCP)    Subjective: Patient resting in bed physical therapy saw this patient and recommended SNF but she refuses SNF she wants to go home with PT.  She reports at home she walks with a walker inside the house.  And she has a lot of family support.  Discharge Exam: Vitals:   10/29/18 2224 10/30/18 0723  BP: 120/63 120/71   Pulse: 81 86  Resp: 16 16  Temp: 98.6 F (37 C) 98.4 F (36.9 C)  SpO2: 100% 100%   Vitals:   10/29/18 0633 10/29/18 1550 10/29/18 2224 10/30/18 0723  BP: 130/70 (!) 118/58 120/63 120/71  Pulse: 98 93 81 86  Resp: 16 18 16 16   Temp: 98.2 F (36.8 C) 98.4 F (36.9 C) 98.6 F (37 C) 98.4 F (36.9 C)  TempSrc: Oral Oral Oral Oral  SpO2: 98% 100% 100% 100%  Weight:      Height:        General: Pt is alert, awake, not in acute distress Cardiovascular: RRR, S1/S2 +, no rubs, no gallops Respiratory: CTA bilaterally, no wheezing, no rhonchi Abdominal: Soft, NT, ND, bowel sounds + Extremities: no edema, no cyanosis    The results of significant diagnostics from this hospitalization (including imaging, microbiology, ancillary and laboratory) are listed below for reference.     Microbiology:  Recent Results (from the past 240 hour(s))  Urine culture     Status: Abnormal   Collection Time: 10/28/18  1:35 PM  Result Value Ref Range Status   Specimen Description   Final    URINE, CLEAN CATCH Performed at [facility] [address] [place], [place] [address]    Special Requests   Final    NONE Performed at [facility], [place] [address], [place], [place] [address]    Culture (A)  Final    >=100,000 COLONIES/mL DIPHTHEROIDS(CORYNEBACTERIUM SPECIES) Standardized susceptibility testing for this organism is not available. Performed at [facility], [place] [address], [place], [place] [address]    Report Status 10/29/2018 FINAL  Final     Labs: BNP (last 3 results) No results for input(s): BNP in the last 8760 hours. Basic Metabolic Panel: Recent Labs  Lab 10/28/18 1510 10/29/18 0656  NA 137 135  K 4.4 3.5  CL 103 105  CO2 24 20*  GLUCOSE 115* 114*  BUN 13 10  CREATININE 0.83 0.66  CALCIUM 9.0 8.3*   Liver Function Tests: Recent Labs  Lab 10/28/18 1510 10/29/18 0656  AST 25 18  ALT 31 27  ALKPHOS 62 43  BILITOT 0.7  0.6  PROT 8.8* 7.2  ALBUMIN 4.8 3.7   No results for input(s): LIPASE, AMYLASE in the last 168 hours. No results for input(s): AMMONIA in the last 168 hours. CBC: Recent Labs  Lab 10/28/18 1510 10/29/18 0656  WBC 10.9* 8.0  NEUTROABS 10.0*  --   HGB 14.7 13.3  HCT 44.4 40.6  MCV 89.5 90.2  PLT 324 245   Cardiac Enzymes: Recent Labs  Lab 10/28/18 1510  TROPONINI <0.03   BNP: Invalid input(s): POCBNP CBG: No results for input(s): GLUCAP in the last 168 hours. D-Dimer No results for input(s): DDIMER in the last 72 hours. Hgb A1c No results for input(s): HGBA1C in the last 72 hours. Lipid Profile No results for input(s): CHOL, HDL, LDLCALC, TRIG, CHOLHDL, LDLDIRECT in the last 72 hours. Thyroid function studies No results for input(s): TSH, T4TOTAL, T3FREE, THYROIDAB in the last 72 hours.  Invalid input(s): FREET3 Anemia work up No results for input(s): VITAMINB12, FOLATE, FERRITIN, TIBC, IRON, RETICCTPCT in the last 72 hours. Urinalysis    Component Value Date/Time   COLORURINE YELLOW 10/28/2018 1335   APPEARANCEUR CLEAR 10/28/2018 1335   LABSPEC 1.006 10/28/2018 1335   PHURINE 6.0 10/28/2018 1335   GLUCOSEU NEGATIVE 10/28/2018 1335   HGBUR MODERATE (A) 10/28/2018 1335   BILIRUBINUR NEGATIVE 10/28/2018 1335   KETONESUR NEGATIVE 10/28/2018 1335   PROTEINUR NEGATIVE 10/28/2018 1335   NITRITE POSITIVE (A) 10/28/2018 1335   LEUKOCYTESUR SMALL (A) 10/28/2018 1335   Sepsis Labs Invalid input(s): PROCALCITONIN,  WBC,  LACTICIDVEN Microbiology Recent Results (from the past 240 hour(s))  Urine culture     Status: Abnormal   Collection Time: 10/28/18  1:35 PM  Result Value Ref Range Status   Specimen Description   Final    URINE, CLEAN CATCH Performed at [facility], [place] [address], [place], [place] [address]    Special Requests   Final    NONE Performed at [facility], [place] [address] [place],  [address]     Culture (A)  Final    >=100,000 COLONIES/mL DIPHTHEROIDS(CORYNEBACTERIUM SPECIES) Standardized susceptibility testing for this organism is not available. Performed at [facility], [place] [address], [place],  [address]  Report Status 10/29/2018 FINAL  Final     Time coordinating discharge: 33  minutes  SIGNED:   Georgette Shell, MD  [facility] 10/30/2018, 9:57 AM Pager   If 7PM-7AM, please contact night-coverage [email or web address] Password [email or web address]

## 2018-10-30 NOTE — Progress Notes (Signed)
CSW consulted for SNF placement. Patient discharged home - signing off.   Pricilla Holm, MSW, [facility] [phone]

## 2018-10-30 NOTE — Progress Notes (Signed)
  Patient discharged to home, all discharge medications and instructions reviewed and questions answered.  Patient to be transported via PTAR.  

## 2018-11-02 DIAGNOSIS — G35 Multiple sclerosis: Secondary | ICD-10-CM | POA: Diagnosis not present

## 2018-11-02 DIAGNOSIS — N390 Urinary tract infection, site not specified: Secondary | ICD-10-CM | POA: Diagnosis not present

## 2018-11-02 DIAGNOSIS — B9689 Other specified bacterial agents as the cause of diseases classified elsewhere: Secondary | ICD-10-CM | POA: Diagnosis not present

## 2018-11-04 DIAGNOSIS — G35 Multiple sclerosis: Secondary | ICD-10-CM | POA: Diagnosis not present

## 2018-11-04 DIAGNOSIS — N390 Urinary tract infection, site not specified: Secondary | ICD-10-CM | POA: Diagnosis not present

## 2018-11-04 DIAGNOSIS — B9689 Other specified bacterial agents as the cause of diseases classified elsewhere: Secondary | ICD-10-CM | POA: Diagnosis not present

## 2018-11-08 DIAGNOSIS — G35 Multiple sclerosis: Secondary | ICD-10-CM | POA: Diagnosis not present

## 2018-11-08 DIAGNOSIS — B9689 Other specified bacterial agents as the cause of diseases classified elsewhere: Secondary | ICD-10-CM | POA: Diagnosis not present

## 2018-11-08 DIAGNOSIS — N390 Urinary tract infection, site not specified: Secondary | ICD-10-CM | POA: Diagnosis not present

## 2018-11-09 DIAGNOSIS — B9689 Other specified bacterial agents as the cause of diseases classified elsewhere: Secondary | ICD-10-CM | POA: Diagnosis not present

## 2018-11-09 DIAGNOSIS — N390 Urinary tract infection, site not specified: Secondary | ICD-10-CM | POA: Diagnosis not present

## 2018-11-09 DIAGNOSIS — G35 Multiple sclerosis: Secondary | ICD-10-CM | POA: Diagnosis not present

## 2018-11-15 DIAGNOSIS — B9689 Other specified bacterial agents as the cause of diseases classified elsewhere: Secondary | ICD-10-CM | POA: Diagnosis not present

## 2018-11-15 DIAGNOSIS — N390 Urinary tract infection, site not specified: Secondary | ICD-10-CM | POA: Diagnosis not present

## 2018-11-15 DIAGNOSIS — G35 Multiple sclerosis: Secondary | ICD-10-CM | POA: Diagnosis not present

## 2018-11-16 DIAGNOSIS — N390 Urinary tract infection, site not specified: Secondary | ICD-10-CM | POA: Diagnosis not present

## 2018-11-16 DIAGNOSIS — G35 Multiple sclerosis: Secondary | ICD-10-CM | POA: Diagnosis not present

## 2018-11-16 DIAGNOSIS — B9689 Other specified bacterial agents as the cause of diseases classified elsewhere: Secondary | ICD-10-CM | POA: Diagnosis not present

## 2018-11-19 ENCOUNTER — Encounter: Payer: Self-pay | Admitting: Nurse Practitioner

## 2018-11-19 ENCOUNTER — Ambulatory Visit (INDEPENDENT_AMBULATORY_CARE_PROVIDER_SITE_OTHER): Payer: BLUE CROSS/BLUE SHIELD | Admitting: Nurse Practitioner

## 2018-11-19 VITALS — BP 132/80 | HR 101 | Ht 66.0 in | Wt 270.0 lb

## 2018-11-19 DIAGNOSIS — N390 Urinary tract infection, site not specified: Secondary | ICD-10-CM

## 2018-11-19 DIAGNOSIS — G35 Multiple sclerosis: Secondary | ICD-10-CM | POA: Diagnosis not present

## 2018-11-19 DIAGNOSIS — Z09 Encounter for follow-up examination after completed treatment for conditions other than malignant neoplasm: Secondary | ICD-10-CM | POA: Diagnosis not present

## 2018-11-19 LAB — POCT URINALYSIS DIPSTICK
BILIRUBIN UA: NEGATIVE
Glucose, UA: NEGATIVE
Ketones, UA: NEGATIVE
Leukocytes, UA: NEGATIVE
Nitrite, UA: NEGATIVE
Protein, UA: NEGATIVE
Spec Grav, UA: 1.015 (ref 1.010–1.025)
Urobilinogen, UA: 0.2 E.U./dL
pH, UA: 7 (ref 5.0–8.0)

## 2018-11-19 NOTE — Progress Notes (Signed)
Subjective:     Patient ID: [name] , female    DOB: 1966/01/14 , 53 y.o.   MRN: [id number]   Chief Complaint  Patient presents with  . Hospitalization Follow-up    hospitla follow for uti, weakness     HPI  Here for hospital follow up admission 2/13-2/15 after being constipated.  After having a bowel movement she was more weak and was admitted. Also was found to have a UTI   She is doing better since being home.  Negative for burning on urination.   Now having PT/OT at home.  No home health nurse is visiting.  Does not need assistance.  She has two teenagers and her husband at home.    Continues to take tecfidera.        Past Medical History:  Diagnosis Date  . Movement disorder   . Multiple sclerosis ([place])      Family History  Problem Relation Age of Onset  . Hypertension Mother   . Hypertension Father   . Diabetes Father      Current Outpatient Medications:  .  CVS B-12 500 MCG tablet, TAKE 2 TABS BY MOUTH DAILY (Patient taking differently: Take 1,000 mcg by mouth daily. ), Disp: 100 tablet, Rfl: 7 .  CVS D3 50 MCG (2000 UT) CAPS, TAKE 1 CAPSULE BY MOUTH DAILY OR AS DIRECTED (Patient taking differently: Take 1 capsule by mouth daily. ), Disp: 90 capsule, Rfl: 3 .  Misc Natural Products (TURMERIC CURCUMIN) CAPS, Take by mouth 2 (two) times daily., Disp: , Rfl:  .  progesterone (PROMETRIUM) 100 MG capsule, Take 100 mg by mouth at bedtime. , Disp: , Rfl: 0 .  Coenzyme Q10 (CO Q 10 PO), Take by mouth., Disp: , Rfl:  .  Dimethyl Fumarate (TECFIDERA) 240 MG CPDR, TAKE 1 CAPSULE (240 MG) BY MOUTH TWICE DAILY, Disp: 180 capsule, Rfl: 3 .  Folic TSVX-B9-T90-ZESPQ 3 Acid (MI-OMEGA PO), Take by mouth., Disp: , Rfl:  .  Probiotic Product (PROBIOTIC PO), Take by mouth., Disp: , Rfl:  No current facility-administered medications for this visit.   Facility-Administered Medications Ordered in Other Visits:  .  gadopentetate dimeglumine (MAGNEVIST) injection 20 mL, 20 mL,  Intravenous, Once PRN, Sater, Nanine Means, MD   Allergies  Allergen Reactions  . Food Other (See Comments)    WHAT FOOD CAUSES THIS REACTION? Eyes swelling and throat swelling  . Oxycodone Hcl Hives  . Soy Allergy     Other reaction(s): SWELLING     Review of Systems  Constitutional: Negative for fatigue.  Respiratory: Negative for cough.   Cardiovascular: Negative for chest pain, palpitations and leg swelling.  Endocrine: Negative for polydipsia, polyphagia and polyuria.  Neurological: Negative for dizziness and headaches.     Today's Vitals   11/19/18 1210  BP: 132/80  Pulse: (!) 101  SpO2: 98%  Weight: 270 lb (122.5 kg)  Height: _0  (1.676 m)   Body mass index is 43.58 kg/m.   Objective:  Physical Exam Vitals signs reviewed.  Constitutional:      Appearance: Normal appearance.  Cardiovascular:     Rate and Rhythm: Normal rate and regular rhythm.     Pulses: Normal pulses.     Heart sounds: Normal heart sounds. No murmur.  Pulmonary:     Effort: Pulmonary effort is normal.     Breath sounds: Normal breath sounds.  Musculoskeletal:     Comments: She is in a wheelchair and has significant difficulty with  standing alone  Skin:    General: Skin is warm and dry.     Capillary Refill: Capillary refill takes less than 2 seconds.  Neurological:     Mental Status: She is alert.         Assessment And Plan:     1. Acute urinary tract infection  Was hospitalized for UTI will recheck  - CBC no Diff - BMP8+eGFR - POCT Urinalysis Dipstick (81002)  2. Multiple sclerosis ([place])  She continues to be seen by Neurology  She had not been to this office in over 1 1/2 years and had been to a different provider  She has significant weakness       [name], FNP

## 2018-11-20 LAB — BMP8+EGFR
BUN / CREAT RATIO: 13 (ref 9–23)
BUN: 9 mg/dL (ref 6–24)
CO2: 18 mmol/L — ABNORMAL LOW (ref 20–29)
CREATININE: 0.71 mg/dL (ref 0.57–1.00)
Calcium: 9.8 mg/dL (ref 8.7–10.2)
Chloride: 105 mmol/L (ref 96–106)
GFR calc Af Amer: 113 mL/min/{1.73_m2} (ref >59)
GFR calc non Af Amer: 98 mL/min/{1.73_m2} (ref >59)
Glucose: 89 mg/dL (ref 65–99)
Potassium: 4.2 mmol/L (ref 3.5–5.2)
Sodium: 142 mmol/L (ref 134–144)

## 2018-11-20 LAB — CBC
Hematocrit: 39.2 % (ref 34.0–46.6)
Hemoglobin: 13.8 g/dL (ref 11.1–15.9)
MCH: 30.5 pg (ref 26.6–33.0)
MCHC: 35.2 g/dL (ref 31.5–35.7)
MCV: 87 fL (ref 79–97)
Platelets: 302 10*3/uL (ref 150–450)
RBC: 4.52 x10E6/uL (ref 3.77–5.28)
RDW: 13.6 % (ref 11.7–15.4)
WBC: 9 10*3/uL (ref 3.4–10.8)

## 2018-11-22 DIAGNOSIS — N390 Urinary tract infection, site not specified: Secondary | ICD-10-CM | POA: Diagnosis not present

## 2018-11-22 DIAGNOSIS — G35 Multiple sclerosis: Secondary | ICD-10-CM | POA: Diagnosis not present

## 2018-11-22 DIAGNOSIS — B9689 Other specified bacterial agents as the cause of diseases classified elsewhere: Secondary | ICD-10-CM | POA: Diagnosis not present

## 2018-11-23 ENCOUNTER — Telehealth: Payer: Self-pay

## 2018-11-23 DIAGNOSIS — N390 Urinary tract infection, site not specified: Secondary | ICD-10-CM | POA: Diagnosis not present

## 2018-11-23 DIAGNOSIS — G35 Multiple sclerosis: Secondary | ICD-10-CM | POA: Diagnosis not present

## 2018-11-23 DIAGNOSIS — B9689 Other specified bacterial agents as the cause of diseases classified elsewhere: Secondary | ICD-10-CM | POA: Diagnosis not present

## 2018-11-23 NOTE — Telephone Encounter (Signed)
Pt called back to let you know that, you are her PCP, and the other doctor that she is seeing is her specialist. She also wants to know  When you want her come back in for a another appt. [phone].

## 2018-11-24 NOTE — Telephone Encounter (Signed)
She will need to set up for an AWV in the next 3 months with physical.

## 2018-12-03 DIAGNOSIS — B9689 Other specified bacterial agents as the cause of diseases classified elsewhere: Secondary | ICD-10-CM | POA: Diagnosis not present

## 2018-12-03 DIAGNOSIS — G35 Multiple sclerosis: Secondary | ICD-10-CM | POA: Diagnosis not present

## 2018-12-03 DIAGNOSIS — N390 Urinary tract infection, site not specified: Secondary | ICD-10-CM | POA: Diagnosis not present

## 2018-12-06 ENCOUNTER — Ambulatory Visit: Payer: BLUE CROSS/BLUE SHIELD | Admitting: Neurology

## 2018-12-15 ENCOUNTER — Ambulatory Visit: Payer: BLUE CROSS/BLUE SHIELD | Admitting: Nurse Practitioner

## 2019-01-06 ENCOUNTER — Telehealth: Payer: Self-pay | Admitting: Nurse Practitioner

## 2019-01-06 NOTE — Telephone Encounter (Signed)
I left a message asking the patient to call me at [phone] to schedule AWV-I and CPE. VDM (DD)

## 2019-02-01 ENCOUNTER — Telehealth: Payer: Self-pay | Admitting: Neurology

## 2019-02-01 NOTE — Telephone Encounter (Signed)
Called pt back. Advised Dr. Felecia Shelling would prefer to see her every 6 months. She is due now for a f/u. Pt agreeable to do a doxy.me virtual visit. Scheduled her for 02/08/19 at 9am with Dr. Felecia Shelling. I emailed her link at [email or web address]. Asked her to call back if she does not receive. I updated med list/pharmacy/allergies.  She walks with a walker.

## 2019-02-01 NOTE — Telephone Encounter (Signed)
Patient wanted to rs her apt for September due to COVID-19 health risk, we rescheduled apt but she would like to speak with nurse to reassure her that it is ok for her to wait until September to come in. Please call and advise.

## 2019-02-08 ENCOUNTER — Encounter: Payer: Self-pay | Admitting: Neurology

## 2019-02-08 ENCOUNTER — Other Ambulatory Visit: Payer: Self-pay

## 2019-02-08 ENCOUNTER — Ambulatory Visit (INDEPENDENT_AMBULATORY_CARE_PROVIDER_SITE_OTHER): Payer: BLUE CROSS/BLUE SHIELD | Admitting: Neurology

## 2019-02-08 DIAGNOSIS — R269 Unspecified abnormalities of gait and mobility: Secondary | ICD-10-CM

## 2019-02-08 DIAGNOSIS — R350 Frequency of micturition: Secondary | ICD-10-CM

## 2019-02-08 DIAGNOSIS — E559 Vitamin D deficiency, unspecified: Secondary | ICD-10-CM

## 2019-02-08 DIAGNOSIS — R5383 Other fatigue: Secondary | ICD-10-CM | POA: Diagnosis not present

## 2019-02-08 DIAGNOSIS — G35 Multiple sclerosis: Secondary | ICD-10-CM | POA: Diagnosis not present

## 2019-02-08 DIAGNOSIS — Z79899 Other long term (current) drug therapy: Secondary | ICD-10-CM | POA: Diagnosis not present

## 2019-02-08 MED ORDER — DIMETHYL FUMARATE 240 MG PO CPDR: DELAYED_RELEASE_CAPSULE | ORAL | 3 refills | Status: DC

## 2019-02-08 NOTE — Progress Notes (Signed)
[facility]  PATIENT: [name] DOB: 01/30/1966  REFERRING CLINICIAN: Willey Blade HISTORY FROM: Patient and mother REASON FOR VISIT: Multiple sclerosis   HISTORICAL  CHIEF COMPLAINT:  Chief Complaint  Patient presents with  . Multiple Sclerosis    HISTORY OF PRESENT ILLNESS:  [name] is a 53 y.o. woman with relapsing remitting MS diagnosed in 1997.    Update 02/08/2019: Virtual Visit via Video Note I connected with [name]  on 02/08/19 at  9:00 AM EDT by a video enabled telemedicine application and verified that I am speaking with the correct person.  I discussed the limitations of evaluation and management by telemedicine and the availability of in person appointments. The patient expressed understanding and agreed to proceed.  History of Present Illness: She feels mostly stale on Tecfidera 240 mg bid and tolerates it well.  Unfortunately, most recent labs at PCP did not included differential but lymphocytes were a little low at 0.5 when she went to the ED for a UTI in 10/28/2017.  I discussed with her that we would need to recheck this and make sure that the lymphocyte count has not dropped too much.  She denies GI upset or flushing.  Her main problem is walking.  She uses a walker almost exclusively now.   No recent falls.    We had discussed Ampyra but she was concerned about seizure risk.   Her left leg is weaker than the right but they are close to symmetric now.  Arms are strong and symmetric.   She has spasticity in both legs.   No numbness.   She has urinary frequency.  Vision is normal and symmetric.    She has some fatigue and feels vitamins like CoQ10 have helped.  She sleeps well at night and often takes a nap.    She feels mood is doing well.  Cognition is doing okay.  Her joint pain is better with supplements including tumeric and curcumin.     She takes Vit D supplements as well.  Observations/Objective:  She is a  well-developed well-nourished woman in no acute distress.  The head is normocephalic and atraumatic.  Sclera are anicteric.  Visible skin appears normal.  The neck has a good range of motion.  Pharynx and tongue have normal appearance.  She is alert and fully oriented with fluent speech and good attention, knowledge and memory.  Extraocular muscles are intact.  Facial strength is normal.  Palatal elevation and tongue protrusion are midline.  She appears to have normal strength in the arms.  Rapid alternating movements and finger-nose-finger are performed well.  Assessment and Plan: Multiple sclerosis ([place]) - Plan: CBC with Differential/Platelet  High risk medication use - Plan: CBC with Differential/Platelet  Other fatigue  Urinary frequency  Avitaminosis D  Gait disturbance  1.   Continue Tecfidera.  We will check a CBC with differential to determine if there is any lymphopenia. 2.   She will continue to take vitamin D. 3.   For safety, she should use a walker around the house and wheelchair outside the house. 4.   We discussed her risks of COVID 19.  MS itself does not add much risk but since she has difficulty walking she might also have some difficulty taking deep breaths.  That may at some risk.  Additionally, Tecfidera may add a small amount of risk.  The risk could be higher, however, if the lymphocyte count is low.  We discussed trying  to stay active and try to get out of the house on a daily basis. 5.     rtc 6 months, sooner if problems    Follow Up Instructions:  I discussed the assessment and treatment plan with the patient. The patient was provided an opportunity to ask questions and all were answered. The patient agreed with the plan and demonstrated an understanding of the instructions.    The patient was advised to call back or seek an in-person evaluation if the symptoms worsen or if the condition fails to improve as anticipated.  I provided 25 minutes of non-face-to-face time  during this encounter. ___________________________________ From previous visits Update 05/31/2018: She feels she is mostly stable.   With a walker she can walk around her house.    She feels she is mildly better compared to earlier in the year.     She has ben reluctant to try Ampyra due to the risk of seizures.    She denies numbness (occ tingling).   Her left leg is usually weaker than the right but sometimes legs are equal.     She has urinary urgency and has frequency (q 2-3 hours).     She falls asleep easily but has 2 x nocturia.   She usually falls back asleep easily.    She has some fatigue and   She has some joint pain.   She stopped meloxicam and is on tumeric and feels it is better than the NSAID.      Update 11/19/2017: She feels her MS is stable. She is on Tecfidera. She tolerates it well. She has not had any recent exacerbations.    The MRI of the brain performed 06/22/2017 was compared to the MRI dated 06/06/2015. She has lesions in the hemispheres and brainstem consistent with MS. None of them were acute and there were no changes over the 2 year interval.     She has multiple foci in both the cervical and thoracic spine none of them appear to be acute.  Her gait is about the same as 6 months ago.    She uses a walker because ankles are weak.   Outside, sometimes she will use a wheelchair.    She notes pain in the ankles and feet.       There is ankle discomfort mor ethan foot.   She has a  little hand numbness.    Bladder is about the same with frequency and very rare incontinence.    She notes fatigue.   Sometimes she is sleepy during the day.   She spends a lot of time in her home.    She denies depression.   She used to be more active and did water therapy that she enjoyed.    She sleeps well at night Cognition is fine.    From 04/23/2017:  MS:    She is on Tecfidera and she tolerates it well.  No flushing or GI issues.   She denies any recent exacerbation..   Her last MRI of the  brain was 06/08/2015. It did not show any enhancing lesions. An MRI of the cervical spine 06/08/2015 showed several foci within the spinal cord consistent with MS. These images were personally reviewed today.  Gait/strength/sensation:  Her gait is a little worse compared to her last visit.    she uses a walker around the house and wheelchair for long distances outside.   The left leg is worse than the right leg and she  has left AFO brace for foot drop but does not use it recently.   She denies any weakness in the arms. He r left leg is spastic.   She notes left leg ataxia.   Right leg issues are milder.  We had discussed Ampyra in the past. 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        Bladder:   She feels her bladder is better with only mild frequency now.   She does not need any medication.    Vision:  She denies any MS related vision changes.  Fatigue:   She notes mild fatigue most days. The fatigue worsens as the day goes on. She is often sleepy and takes naps most days. She sleeps well at night.   She does not snore.       .  Mood/cognition:  She feels mood is doing well though she is sometimes irritable. She denies any cognitive dysfunction. There is no difficulty with verbal fluency or short-term memory.  LBP/leg pain:   Her back and radicular left leg pain resolved.    Pain increases with bending.   NSAIDs help.     A chiropractor told her one leg is shorter than another and she feels manipulations are helping some.    MS History:   In 1997, she presented with backache, poor gait and visual blurring. She had an MRI and a lumbar puncture. The studies were consistent with multiple sclerosis.  She turned down a disease modifying therapy at that time. Her legs and her walking got completely better and she was back at baseline after the steroids. Over the next 15 years or so she had no additional symptoms. She had worsening  symptoms in the left leg that was affecting her balance. MRI 2-3 years ago showed progression. She opted not to go on medication at that time.   She went to the [place] [facility] at [facility] for several visits. At that time she was started on Tecfidera and she tolerates it well and has not had much flushing or GI symptoms. She has not had definite exacerbations. However, her gait has worsened and she has gone from a cane to a walker.  REVIEW OF SYSTEMS:  Constitutional: No fevers, chills, sweats, or change in appetite Eyes: No visual changes, double vision, eye pain Ear, nose and throat: No hearing loss, ear pain, nasal congestion, sore throat Cardiovascular: No chest pain, palpitations Respiratory:  No shortness of breath at rest or with exertion.   No wheezes GastrointestinaI: No nausea, vomiting, diarrhea, abdominal pain, fecal incontinence Genitourinary:  No dysuria, urinary retention or frequency.  No nocturia. Musculoskeletal:  No neck pain.   She reports some back pain and occasionally proximal leg pain. Integumentary: No rash, pruritus, skin lesions Neurological: as above Psychiatric: She denies depression but notes that she is sometimes moody. There is no anxiety. Endocrine: No palpitations, diaphoresis, change in appetite or increased thirst Hematologic/Lymphatic:  No anemia, purpura, petechiae. Allergic/Immunologic: No itchy/runny eyes, nasal congestion, recent allergic reactions, rashes  ALLERGIES: Allergies  Allergen Reactions  . Food Other (See Comments)    WHAT FOOD CAUSES THIS REACTION? Eyes swelling and throat swelling  . Oxycodone Hcl Hives  . Soy Allergy     Other reaction(s): SWELLING    HOME MEDICATIONS: Outpatient Medications Prior to Visit  Medication Sig Dispense Refill  . Coenzyme Q10 (CO Q 10 PO) Take by mouth.    [name]  [facility] B-12 500 MCG tablet TAKE 2 TABS BY MOUTH DAILY (Patient taking differently: Take 1,000 mcg by mouth daily. ) 100 tablet 7  . [facility] D3 50 MCG (2000 UT)  CAPS TAKE 1 CAPSULE BY MOUTH DAILY OR AS DIRECTED (Patient taking differently: Take 1 capsule by mouth daily. ) 90 capsule 3  . Folic [id number] 3 Acid (MI-OMEGA PO) Take by mouth.    . Misc Natural Products (TURMERIC CURCUMIN) CAPS Take by mouth 2 (two) times daily.    . Probiotic Product (PROBIOTIC PO) Take by mouth.    . progesterone (PROMETRIUM) 100 MG capsule Take 100 mg by mouth at bedtime.   0  . TECFIDERA 240 MG CPDR TAKE 1 CAPSULE (240 MG) BY MOUTH TWICE DAILY (Patient taking differently: TAKE 1 CAPSULE (240 MG) BY MOUTH TWICE DAILY) 180 capsule 3   Facility-Administered Medications Prior to Visit  Medication Dose Route Frequency Provider Last Rate Last Dose  . gadopentetate dimeglumine (MAGNEVIST) injection 20 mL  20 mL Intravenous Once PRN Sater, Nanine Means, MD        PAST MEDICAL HISTORY: Past Medical History:  Diagnosis Date  . Movement disorder   . Multiple sclerosis ([place])     PAST SURGICAL HISTORY: Past Surgical History:  Procedure Laterality Date  . CESAREAN SECTION    . UTERINE FIBROID SURGERY    . WISDOM TOOTH EXTRACTION      FAMILY HISTORY: Family History  Problem Relation Age of Onset  . Hypertension Mother   . Hypertension Father   . Diabetes Father     SOCIAL HISTORY:  Social History   Socioeconomic History  . Marital status: Married    Spouse name: Not on file  . Number of children: Not on file  . Years of education: Not on file  . Highest education level: Not on file  Occupational History  . Not on file  Social Needs  . Financial resource strain: Not on file  . Food insecurity:    Worry: Not on file    Inability: Not on file  . Transportation needs:    Medical: Not on file    Non-medical: Not on file  Tobacco Use  . Smoking status: Never Smoker  . Smokeless tobacco: Never Used  Substance and Sexual Activity  . Alcohol use: No  . Drug use: No  . Sexual activity: Not on file  Lifestyle  . Physical activity:    Days per week:  Not on file    Minutes per session: Not on file  . Stress: Not on file  Relationships  . Social connections:    Talks on phone: Not on file    Gets together: Not on file    Attends religious service: Not on file    Active member of club or organization: Not on file    Attends meetings of clubs or organizations: Not on file    Relationship status: Not on file  . Intimate partner violence:    Fear of current or ex partner: Not on file    Emotionally abused: Not on file    Physically abused: Not on file    Forced sexual activity: Not on file  Other Topics Concern  . Not on file  Social History Narrative  . Not on file     PHYSICAL EXAM  There were no vitals filed for this visit.  There is no height or weight on file to calculate BMI.   General: The patient is well-developed and well-nourished and  in no acute distress.   She uses a walker   Neurologic Exam  Mental status: The patient is alert and oriented x 3 at the time of the examination. The patient has apparent normal recent and remote memory, with an apparently normal attention span and concentration ability.   Speech is normal.  Cranial nerves: She has bilateral INO's and nystagmus.  Facial symmetry is present. Facial strength and sensation is normal. Trapezius and sternocleidomastoid strength is normal. No dysarthria is noted.  The tongue is midline, and the patient has symmetric elevation of the soft palate. No obvious hearing deficits are noted.  Motor:  Muscle bulk is normal.  Muscle tone is increased in the legs, left greater than right.  Muscle tone was fairly normal in the arms.  Strength was normal in the right arm and just minimally reduced in the left arm (triceps) though left rapid alternating movements were slowed.  In the legs, she has reduced strength bilaterally , 3/5 on the right with 4-/5 quads and 2-3/5 on the left.    Sensory: In the arms, there was normal sensation to touch and vibration. She had normal  touch sensation in the legs but reduced vibratory sensation in the left leg.  \  Coordination: Cerebellar testing reveals good finger-nose-finger and is unable to do heel-to-shin  Gait and station: Station is unstable and she does better with unilateral support.   Positive Romberg,   . Wide stride with left foot drop and needs unilateral support for balance.    Reflexes: Deep tendon reflexes are increased on let more than right legs.     DIAGNOSTIC DATA (LABS, IMAGING, TESTING) - I reviewed patient records, labs, notes, testing and imaging myself where available.  Lab Results  Component Value Date   WBC 9.0 11/19/2018   HGB 13.8 11/19/2018   HCT 39.2 11/19/2018   MCV 87 11/19/2018   PLT 302 11/19/2018     Richard A. Felecia Shelling, MD, PhD 01/07/9562, 8:75 AM Certified in Neurology, Clinical Neurophysiology, Sleep Medicine, Pain Medicine and Neuroimaging  [facility] [address], [place], [place] [address] [phone]

## 2019-02-11 ENCOUNTER — Encounter: Payer: Self-pay | Admitting: Nurse Practitioner

## 2019-02-18 ENCOUNTER — Ambulatory Visit: Payer: BLUE CROSS/BLUE SHIELD | Admitting: Neurology

## 2019-02-28 ENCOUNTER — Other Ambulatory Visit: Payer: Self-pay | Admitting: Neurology

## 2019-03-09 ENCOUNTER — Encounter: Payer: BLUE CROSS/BLUE SHIELD | Admitting: Nurse Practitioner

## 2019-03-09 ENCOUNTER — Ambulatory Visit: Payer: BLUE CROSS/BLUE SHIELD

## 2019-03-10 ENCOUNTER — Telehealth: Payer: Self-pay | Admitting: Neurology

## 2019-03-10 NOTE — Telephone Encounter (Signed)
I reached out to the pt and left a vm asking her to call me back.

## 2019-03-10 NOTE — Telephone Encounter (Signed)
I reached out to the pt. She wanted to know Dr. Garth Bigness thoughts on her stating Taopatches and Goli gummies for MS sx treatment? I advised the pt Dr. Felecia Shelling is currently out of the office and will not return until 03/14/19. Pt states she is ok with waiting for him to return to review this message.   I inquired on the question regarding the Tecfidera and pt states she had received a letter from her insurance stating the medication would not be covered. Pt states she reached out to [facility] and was advised no such issue was seen on their side.  Pt is scheduled for shipment on 03/16/19.

## 2019-03-10 NOTE — Telephone Encounter (Signed)
Pt called wanting to get advised on the Taopatch and if it would be something she can try. Also she is wanting to know why the [place] is not being covered. Please advise.

## 2019-03-10 NOTE — Telephone Encounter (Signed)
Please let her know:  The patches are probably safe though there have not been any real studies involving MS so it may not help any.  The Goli Gummies should be safe but there is not much data so it is uncertain if you would get any benefit.

## 2019-03-14 NOTE — Telephone Encounter (Signed)
I reached out to the pt and advised on MD's recommendations. Pt verbalized understanding and had no further questions at this time.

## 2019-05-25 ENCOUNTER — Encounter: Payer: BLUE CROSS/BLUE SHIELD | Admitting: Nurse Practitioner

## 2019-05-25 ENCOUNTER — Ambulatory Visit: Payer: BLUE CROSS/BLUE SHIELD

## 2019-05-31 ENCOUNTER — Telehealth: Payer: Self-pay | Admitting: *Deleted

## 2019-05-31 NOTE — Telephone Encounter (Signed)
Submitted PA Tecfidera-brand on CMM. Key: [id number]. PA approved 05/01/2019-05/30/2020. [id number]

## 2019-06-01 ENCOUNTER — Other Ambulatory Visit: Payer: Self-pay | Admitting: *Deleted

## 2019-06-01 DIAGNOSIS — G35 Multiple sclerosis: Secondary | ICD-10-CM

## 2019-06-01 MED ORDER — TECFIDERA 240 MG PO CPDR: DELAYED_RELEASE_CAPSULE | ORAL | 3 refills | Status: DC

## 2019-06-02 ENCOUNTER — Ambulatory Visit: Payer: BLUE CROSS/BLUE SHIELD | Admitting: Neurology

## 2019-06-05 ENCOUNTER — Other Ambulatory Visit: Payer: Self-pay | Admitting: Neurology

## 2019-06-07 ENCOUNTER — Encounter: Payer: Self-pay | Admitting: Family Medicine

## 2019-06-07 ENCOUNTER — Telehealth (INDEPENDENT_AMBULATORY_CARE_PROVIDER_SITE_OTHER): Payer: BC Managed Care – PPO | Admitting: Family Medicine

## 2019-06-07 DIAGNOSIS — G35 Multiple sclerosis: Secondary | ICD-10-CM

## 2019-06-07 DIAGNOSIS — E538 Deficiency of other specified B group vitamins: Secondary | ICD-10-CM

## 2019-06-07 DIAGNOSIS — E559 Vitamin D deficiency, unspecified: Secondary | ICD-10-CM

## 2019-06-07 MED ORDER — CYANOCOBALAMIN 500 MCG PO TABS: 1000.0000 ug | ORAL_TABLET | Freq: Every day | ORAL | 1 refills | Status: DC

## 2019-06-07 NOTE — Addendum Note (Signed)
Addended by: Inis Sizer D on: 06/07/2019 11:41 AM   Modules accepted: Orders

## 2019-06-07 NOTE — Progress Notes (Signed)
I have read the note, and I agree with the clinical assessment and plan.  Glendel Jaggers A. Diontay Rosencrans, MD, PhD, FAAN Certified in Neurology, Clinical Neurophysiology, Sleep Medicine, Pain Medicine and Neuroimaging  [facility] [address], [address] [place], [place] [address] [phone]  

## 2019-06-07 NOTE — Progress Notes (Signed)
PATIENT: [name] DOB: May 06, 1966  REASON FOR VISIT: follow up HISTORY FROM: patient  Virtual Visit via Telephone Note  I connected with [name] on 06/07/19 at  9:30 AM EDT by telephone and verified that I am speaking with the correct person using two identifiers.   I discussed the limitations, risks, security and privacy concerns of performing an evaluation and management service by telephone and the availability of in person appointments. I also discussed with the patient that there may be a patient responsible charge related to this service. The patient expressed understanding and agreed to proceed.   History of Present Illness:  06/07/19 [name] is a 53 y.o. female here today for follow up of MS. She continues Tecfidera 240mg  BID. She is tolerating medication well with no obvious adverse effects.  She feels that she is stable overall.  No new concerns of weakness, numbness, vision changes, bowel or bladder changes or gait changes.  She feels that mood is stable.  She is using her walker when ambulating.  She continues to have spasticity bilaterally but feels that she strength is improving and symmetric. She is able to perform ADLs.  She does not drive.  She continues vitamin D and B12 supplements.  CBC with diff ordered at last visit in May but has not been completed.  She has limited transportation at this time.  She is following up with her primary care provider, Mrs. Laurance Flatten, on Monday.  She is requesting to have labs drawn during that office visit.    History (copied from Dr Garth Bigness note on 02/08/2019)  She feels mostly stale on Tecfidera 240 mg bid and tolerates it well.  Unfortunately, most recent labs at PCP did not included differential but lymphocytes were a little low at 0.5 when she went to the ED for a UTI in 10/28/2017.  I discussed with her that we would need to recheck this and make sure that the lymphocyte count has not dropped too much.  She denies GI  upset or flushing.  Her main problem is walking.  She uses a walker almost exclusively now.   No recent falls.    We had discussed Ampyra but she was concerned about seizure risk.   Her left leg is weaker than the right but they are close to symmetric now.  Arms are strong and symmetric.   She has spasticity in both legs.   No numbness.   She has urinary frequency.  Vision is normal and symmetric.    She has some fatigue and feels vitamins like CoQ10 have helped.  She sleeps well at night and often takes a nap.    She feels mood is doing well.  Cognition is doing okay.  Her joint pain is better with supplements including tumeric and curcumin.     She takes Vit D supplements as well.  Update 05/31/2018: She feels she is mostly stable.   With a walker she can walk around her house.    She feels she is mildly better compared to earlier in the year.     She has ben reluctant to try Ampyra due to the risk of seizures.    She denies numbness (occ tingling).   Her left leg is usually weaker than the right but sometimes legs are equal.     She has urinary urgency and has frequency (q 2-3 hours).     She falls asleep easily but has 2 x nocturia.   She  usually falls back asleep easily.    She has some fatigue and   She has some joint pain.   She stopped meloxicam and is on tumeric and feels it is better than the NSAID.      Update 11/19/2017: She feels her MS is stable. She is on Tecfidera. She tolerates it well. She has not had any recent exacerbations.    The MRI of the brain performed 06/22/2017 was compared to the MRI dated 06/06/2015. She has lesions in the hemispheres and brainstem consistent with MS. None of them were acute and there were no changes over the 2 year interval.     She has multiple foci in both the cervical and thoracic spine none of them appear to be acute.  Her gait is about the same as 6 months ago.    She uses a walker because ankles are weak.   Outside, sometimes she will use a  wheelchair.    She notes pain in the ankles and feet.       There is ankle discomfort mor ethan foot.   She has a  little hand numbness.    Bladder is about the same with frequency and very rare incontinence.    She notes fatigue.   Sometimes she is sleepy during the day.   She spends a lot of time in her home.    She denies depression.   She used to be more active and did water therapy that she enjoyed.    She sleeps well at night Cognition is fine.    From 04/23/2017: MS:    She is on Tecfidera and she tolerates it well.  No flushing or GI issues.   She denies any recent exacerbation..   Her last MRI of the brain was 06/08/2015. It did not show any enhancing lesions. An MRI of the cervical spine 06/08/2015 showed several foci within the spinal cord consistent with MS. These images were personally reviewed today.  Gait/strength/sensation:  Her gait is a little worse compared to her last visit.    she uses a walker around the house and wheelchair for long distances outside.   The left leg is worse than the right leg and she has left AFO brace for foot drop but does not use it recently.   She denies any weakness in the arms. He r left leg is spastic.   She notes left leg ataxia.   Right leg issues are milder.  We had discussed Ampyra in the past. 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        Bladder:   She feels her bladder is better with only mild frequency now.   She does not need any medication.    Vision:  She denies any MS related vision changes.  Fatigue:   She notes mild fatigue most days. The fatigue worsens as the day goes on. She is often sleepy and takes naps most days. She sleeps well at night.   She does not snore.       .  Mood/cognition:  She feels mood is doing well though she is sometimes irritable. She denies any cognitive dysfunction. There is no difficulty with verbal fluency or short-term memory.  LBP/leg  pain:   Her back and radicular left leg pain resolved.    Pain increases with bending.   NSAIDs help.     A chiropractor told  her one leg is shorter than another and she feels manipulations are helping some.    MS History:   In 1997, she presented with backache, poor gait and visual blurring. She had an MRI and a lumbar puncture. The studies were consistent with multiple sclerosis.  She turned down a disease modifying therapy at that time. Her legs and her walking got completely better and she was back at baseline after the steroids. Over the next 15 years or so she had no additional symptoms. She had worsening symptoms in the left leg that was affecting her balance. MRI 2-3 years ago showed progression. She opted not to go on medication at that time.   She went to the [place] clinic at [facility] for several visits. At that time she was started on Tecfidera and she tolerates it well and has not had much flushing or GI symptoms. She has not had definite exacerbations. However, her gait has worsened and she has gone from a cane to a walker.   Observations/Objective:  Generalized: unable to see patient on video due to technical difficulties.  Mentation: Alert oriented to time, place, history taking. Speech and language fluent   Assessment and Plan:  53 y.o. year old female  has a past medical history of Movement disorder and Multiple sclerosis ([place]). here with    ICD-10-CM   1. Multiple sclerosis ([place])  G35 Vitamin B12    Stratify JCV Ab (w/ Index) w/ Rflx  2. Avitaminosis D  E55.9 Vitamin D, 25-hydroxy  3. B12 deficiency  E53.8 CBC with Differential/Platelets    Vitamin B12   Overall Ms. Ideker is feels that MS is stable.  She is tolerating Tecfidera well with no obvious adverse effects.  I have expressed the importance of updating labs and asked that she ensure these labs are drawn on Monday with PCP.  She may also come to our office to have these labs performed if there are any concerns.  She will  continue current therapy.  She was encouraged to continue activity as tolerated.  She will follow-up with Dr. Ladonna Snide in February.  Appointment scheduled for 10/31/2019 at 11 AM.  She is aware that she can call sooner if needed.  She verbalizes understanding and agreement with this plan.   Orders Placed This Encounter  Procedures   CBC with Differential/Platelets    Standing Status:   Future    Standing Expiration Date:   06/06/2020   Vitamin D, 25-hydroxy    Standing Status:   Future    Standing Expiration Date:   06/06/2020   Vitamin B12    Standing Status:   Future    Standing Expiration Date:   06/06/2020   Stratify JCV Ab (w/ Index) w/ Rflx    Standing Status:   Future    Standing Expiration Date:   06/06/2020    Meds ordered this encounter  Medications   vitamin B-12 (CVS B-12) 500 MCG tablet    Sig: Take 2 tablets (1,000 mcg total) by mouth daily.    Dispense:  180 tablet    Refill:  1    Order Specific Question:   Supervising Provider    Answer:   Melvenia Beam [id number]     Follow Up Instructions:  I discussed the assessment and treatment plan with the patient. The patient was provided an opportunity to ask questions and all were answered. The patient agreed with the plan and demonstrated an understanding of the instructions.   The patient was  advised to call back or seek an in-person evaluation if the symptoms worsen or if the condition fails to improve as anticipated.  I provided 25 minutes of non-face-to-face time during this encounter.  Patient is located at her place of residence during my chart visit.  Technological issues inhibit my ability to see patient clearly.  I was able to communicate clearly.  Provider is in the office.   Debbora Presto, NP

## 2019-06-13 ENCOUNTER — Encounter: Payer: Self-pay | Admitting: Nurse Practitioner

## 2019-06-13 ENCOUNTER — Ambulatory Visit (INDEPENDENT_AMBULATORY_CARE_PROVIDER_SITE_OTHER): Payer: BC Managed Care – PPO | Admitting: Nurse Practitioner

## 2019-06-13 ENCOUNTER — Other Ambulatory Visit: Payer: Self-pay

## 2019-06-13 VITALS — BP 112/74 | HR 100 | Temp 98.2°F

## 2019-06-13 DIAGNOSIS — Z1211 Encounter for screening for malignant neoplasm of colon: Secondary | ICD-10-CM | POA: Diagnosis not present

## 2019-06-13 DIAGNOSIS — Z23 Encounter for immunization: Secondary | ICD-10-CM | POA: Diagnosis not present

## 2019-06-13 DIAGNOSIS — K5900 Constipation, unspecified: Secondary | ICD-10-CM | POA: Diagnosis not present

## 2019-06-13 DIAGNOSIS — M545 Low back pain, unspecified: Secondary | ICD-10-CM

## 2019-06-13 DIAGNOSIS — Z0000 Encounter for general adult medical examination without abnormal findings: Secondary | ICD-10-CM | POA: Diagnosis not present

## 2019-06-13 DIAGNOSIS — R609 Edema, unspecified: Secondary | ICD-10-CM | POA: Diagnosis not present

## 2019-06-13 DIAGNOSIS — R0600 Dyspnea, unspecified: Secondary | ICD-10-CM | POA: Diagnosis not present

## 2019-06-13 DIAGNOSIS — G35 Multiple sclerosis: Secondary | ICD-10-CM

## 2019-06-13 MED ORDER — KETOROLAC TROMETHAMINE 60 MG/2ML IM SOLN
60.0000 mg | Freq: Once | INTRAMUSCULAR | Status: AC
  Administered 2019-06-13: 60 mg via INTRAMUSCULAR

## 2019-06-13 MED ORDER — TETANUS-DIPHTHERIA TOXOIDS TD 2-2 LF/0.5ML IM SUSP: 0.5000 mL | Freq: Once | INTRAMUSCULAR | 0 refills | Status: AC

## 2019-06-13 NOTE — Progress Notes (Signed)
Subjective:     Patient ID: [name] , female    DOB: 09-19-1965 , 53 y.o.   MRN: [id number]   Chief Complaint  Patient presents with   Medicare Wellness    HPI  She continues to see Neurologist  Left leg swelling - for almost a year.  She denies improvement with elevation.  She does not add salt to her foods.  She is drinking about 4 - 16 oz bottles of water per day.  Denies shortness of breath.   She has used a pain cream in the past that caused her to go to the bathroom a lot.   Back Pain This is a new problem. The current episode started more than 1 month ago (In the last 2 months, having difficulty with walking to the bathroom). The problem occurs intermittently. The problem has been gradually worsening since onset. The pain is present in the lumbar spine. The quality of the pain is described as aching. The pain does not radiate. The pain is at a severity of 10/10. The pain is the same all the time. The symptoms are aggravated by standing. Pertinent negatives include no abdominal pain, bladder incontinence, bowel incontinence, chest pain, headaches, leg pain, paresthesias or weakness. Risk factors include sedentary lifestyle and obesity. She has tried NSAIDs (last for a day. Tumeric/ginger ) for the symptoms.     Past Medical History:  Diagnosis Date   Movement disorder    Multiple sclerosis ([place])      Family History  Problem Relation Age of Onset   Hypertension Mother    Hypertension Father    Diabetes Father      Current Outpatient Medications:    Coenzyme Q10 (CO Q 10 PO), Take by mouth., Disp: , Rfl:    CVS D3 50 MCG (2000 UT) CAPS, TAKE 1 CAPSULE BY MOUTH DAILY OR AS DIRECTED (Patient taking differently: Take 1 capsule by mouth daily. ), Disp: 90 capsule, Rfl: 3   Probiotic Product (PROBIOTIC PO), Take by mouth., Disp: , Rfl:    TECFIDERA 240 MG CPDR, TAKE 1 CAPSULE (240 MG) BY MOUTH TWICE DAILY, Disp: 180 capsule, Rfl: 3   TURMERIC PO, Take 1  tablet by mouth. 1000mg  daily, Disp: , Rfl:    vitamin B-12 (CVS B-12) 500 MCG tablet, Take 2 tablets (1,000 mcg total) by mouth daily., Disp: 180 tablet, Rfl: 1   Folic 123456 3 Acid (MI-OMEGA PO), Take by mouth., Disp: , Rfl:    Misc Natural Products (TURMERIC CURCUMIN) CAPS, Take by mouth 2 (two) times daily., Disp: , Rfl:    progesterone (PROMETRIUM) 100 MG capsule, Take 100 mg by mouth at bedtime. , Disp: , Rfl: 0   Allergies  Allergen Reactions   Food Other (See Comments)    WHAT FOOD CAUSES THIS REACTION? Eyes swelling and throat swelling   Oxycodone Hcl Hives   Soy Allergy     Other reaction(s): SWELLING     Review of Systems  Constitutional: Negative.   HENT: Negative.   Eyes: Negative.   Respiratory: Negative.   Cardiovascular: Negative.  Negative for chest pain.  Gastrointestinal: Negative.  Negative for abdominal pain and bowel incontinence.  Endocrine: Negative.   Genitourinary: Negative.  Negative for bladder incontinence.  Musculoskeletal: Positive for back pain.  Skin: Negative.   Allergic/Immunologic: Negative.   Neurological: Negative.  Negative for weakness, headaches and paresthesias.  Hematological: Negative.   Psychiatric/Behavioral: Negative.      Today's Vitals   06/13/19 1533  BP: 112/74  Pulse: 100  Temp: 98.2 F (36.8 C)  TempSrc: Oral  PainSc: 8   PainLoc: Back   There is no height or weight on file to calculate BMI.   Objective:  Physical Exam Constitutional:      Appearance: Normal appearance. She is well-developed.  HENT:     Head: Normocephalic and atraumatic.     Right Ear: Hearing, tympanic membrane, ear canal and external ear normal. There is no impacted cerumen.     Left Ear: Hearing, tympanic membrane, ear canal and external ear normal. There is no impacted cerumen.  Eyes:     General: Lids are normal.     Extraocular Movements: Extraocular movements intact.     Conjunctiva/sclera: Conjunctivae normal.      Pupils: Pupils are equal, round, and reactive to light.     Funduscopic exam:    Right eye: No papilledema.        Left eye: No papilledema.  Neck:     Musculoskeletal: Full passive range of motion without pain, normal range of motion and neck supple.     Thyroid: No thyroid mass.     Vascular: No carotid bruit.  Cardiovascular:     Rate and Rhythm: Normal rate and regular rhythm.     Pulses: Normal pulses.     Heart sounds: Normal heart sounds. No murmur.  Pulmonary:     Effort: Pulmonary effort is normal.     Breath sounds: Normal breath sounds.  Abdominal:     General: Abdomen is flat. Bowel sounds are normal.     Palpations: Abdomen is soft.  Musculoskeletal: Normal range of motion.        General: Tenderness (low back area bilaterally) present. No swelling.     Right lower leg: No edema (trace - 1+ edema).     Left lower leg: No edema (trace - 1+ edema).  Skin:    General: Skin is warm and dry.     Capillary Refill: Capillary refill takes less than 2 seconds.  Neurological:     General: No focal deficit present.     Mental Status: She is alert and oriented to person, place, and time.     Cranial Nerves: No cranial nerve deficit.     Sensory: No sensory deficit.  Psychiatric:        Mood and Affect: Mood normal.        Behavior: Behavior normal.        Thought Content: Thought content normal.        Judgment: Judgment normal.         Assessment And Plan:     1. Encounter for Medicare annual wellness exam  Pt's annual wellness exam was performed and geriatric assessment reviewed.   Pt has no new identiafble wellness concerns at this time.   WIll obtain routine labs.   Will obtain UA and micro.   Behavior modifications discussed and diet history reviewed. Pt will continue to exercise regularly and modify diet, with low GI, plant based foods and decrease food intake of processed foods.   Recommend intake of daily multivitamin, Vitamin D, and calcium.  Recommond  mammogram and colonoscopy for preventive screenings, as well as recommend immunizations that include influenza (up to date) and TDAP  2. Need for influenza vaccination  Influenza vaccine given in office  Advised to take Tylenol as needed for muscle aches or fever - Flu Vaccine QUAD 6+ mos PF IM (Fluarix Quad PF)  3. Encounter  for screening colonoscopy  According to USPTF Colorectal cancer Screening guidelines. Colonoscopy is recommended every 10 years, starting at age 77years.  Will refer to GI for colon cancer screening. - Ambulatory referral to Gastroenterology  4. Encounter for immunization  tetanus vaccine was given today while in office. Refer to order management. TDAP will be administered to adults 65-24 years old every 10 years. - diptheria-tetanus toxoids [facility]) 2-2 LF/0.5ML injection; Inject 0.5 mLs into the muscle once for 1 dose.  Dispense: 0.5 mL; Refill: 0  5. Constipation, unspecified constipation type  Increase water intake and fiber intake  Will refer to GI for colonoscopy for routine - Ambulatory referral to Gastroenterology - CMP14 + Anion Gap  6. Multiple sclerosis ([place])  Chronic, continues follow up with Neurology - Referral to Chronic Care Management Services  7. Edema, unspecified type  Bilateral lower extremity swelling trace to 1+, will check d dimer today pending labs will send for venous doppler  Encouraged to elevate feet when possible - D-dimer, quantitative (not at [facility]) - CMP14 + Anion Gap - US Venous Img Lower Bilateral; Future  8. Dyspnea, unspecified type  Intermittent dyspnea, no abnormal findings on physical exam  Will check BNP  9. Acute midline low back pain without sciatica  Tenderness to palpation to low back  Negative radiculopathy  Toradol 60 mg IM given  - ketorolac (TORADOL) injection 60 mg   [name], FNP    THE PATIENT IS ENCOURAGED TO PRACTICE SOCIAL DISTANCING DUE TO THE COVID-19 PANDEMIC.      Subjective:    Elbia A Zarr is a 53 y.o. female who presents for a Welcome to Medicare exam.   Review of Systems See above        Objective:    Today's Vitals   06/13/19 1533  BP: 112/74  Pulse: 100  Temp: 98.2 F (36.8 C)  TempSrc: Oral  PainSc: 8   PainLoc: Back  There is no height or weight on file to calculate BMI.  Medications Outpatient Encounter Medications as of 06/13/2019  Medication Sig   Coenzyme Q10 (CO Q 10 PO) Take by mouth.   CVS D3 50 MCG (2000 UT) CAPS TAKE 1 CAPSULE BY MOUTH DAILY OR AS DIRECTED (Patient taking differently: Take 1 capsule by mouth daily. )   Probiotic Product (PROBIOTIC PO) Take by mouth.   TECFIDERA 240 MG CPDR TAKE 1 CAPSULE (240 MG) BY MOUTH TWICE DAILY   TURMERIC PO Take 1 tablet by mouth. 1000mg  daily   vitamin B-12 (CVS B-12) 500 MCG tablet Take 2 tablets (1,000 mcg total) by mouth daily.   [EXPIRED] diptheria-tetanus toxoids (DECAVAC) 2-2 LF/0.5ML injection Inject 0.5 mLs into the muscle once for 1 dose.   Folic 123456 3 Acid (MI-OMEGA PO) Take by mouth.   Misc Natural Products (TURMERIC CURCUMIN) CAPS Take by mouth 2 (two) times daily.   progesterone (PROMETRIUM) 100 MG capsule Take 100 mg by mouth at bedtime.    [EXPIRED] ketorolac (TORADOL) injection 60 mg    No facility-administered encounter medications on file as of 06/13/2019.      History: Past Medical History:  Diagnosis Date   Movement disorder    Multiple sclerosis ([place])    Past Surgical History:  Procedure Laterality Date   CESAREAN SECTION     UTERINE FIBROID SURGERY     WISDOM TOOTH EXTRACTION      Family History  Problem Relation Age of Onset   Hypertension Mother    Hypertension Father  Diabetes Father    Social History   Occupational History   Not on file  Tobacco Use   Smoking status: Never Smoker   Smokeless tobacco: Never Used  Substance and Sexual Activity   Alcohol use: No   Drug use: No    Sexual activity: Not on file    Tobacco Counseling Counseling given: No   Immunizations and Health Maintenance There is no immunization history for the selected administration types on file for this patient. Health Maintenance Due  Topic Date Due   TETANUS/TDAP  01/19/1985   PAP SMEAR-Modifier  01/20/1987   COLONOSCOPY  01/20/2016   MAMMOGRAM  08/22/2016    Activities of Daily Living In your present state of health, do you have any difficulty performing the following activities: 10/28/2018  Hearing? N  Vision? N  Difficulty concentrating or making decisions? N  Walking or climbing stairs? Y  Dressing or bathing? Y  Doing errands, shopping? Y  Some recent data might be hidden    Physical Exam  (optional), or other factors deemed appropriate based on the beneficiary's medical and social history and current clinical standards.  Advanced Directives: Does Patient Have a Medical Advance Directive?: No Would patient like information on creating a medical advance directive?: Yes (MAU/Ambulatory/Procedural Areas - Information given)    Assessment:    This is a routine wellness examination for this patient .   Vision/Hearing screen  Hearing Screening   125Hz  250Hz  500Hz  1000Hz  2000Hz  3000Hz  4000Hz  6000Hz  8000Hz   Right ear:   25 25 20  20     Left ear:   n/a 25 20  20       Visual Acuity Screening   Right eye Left eye Both eyes  Without correction: 20/30 20/20 20/20   With correction:       Dietary issues and exercise activities discussed:     Goals     Assist with Chronic Care Management and Care Coordination needs     Current Barriers:   Knowledge Barriers related to resources and support available to address needs related to Chronic disease management and Care Coordination needs   Case Manager Clinical Goal(s):   Over the next 30 days, patient will work with the CCM team to address needs related to Chronic disease management for Multiple  Sclerosis  Interventions:   Collaborated with BSW and initiated plan of care to address needs related to Chronic Care Management and Care Coordination needs  Patient Self Care Activities:   Self administers medications as prescribed  Attends all scheduled provider appointments  Calls pharmacy for medication refills  Calls provider office for new concerns or questions  Initial goal documentation      Increase physical activity     "would like to be able to walk without her walker"      Depression Screen PHQ 2/9 Scores 06/13/2019  PHQ - 2 Score 0     Fall Risk Fall Risk  06/13/2019  Falls in the past year? 1  Number falls in past yr: 0  Comment -  Injury with Fall? 0    Cognitive Function:     6CIT Screen 06/13/2019  What Year? 0 points  What month? 0 points  What time? 0 points  Count back from 20 0 points  Months in reverse 0 points  Repeat phrase 4 points  Total Score 4    Patient Care Team: [name], FNP as PCP - General ([place]) Little, Claudette Stapler, RN as Case Manager  Plan:   See above  I have personally reviewed and noted the following in the patients chart:    Medical and social history  Use of alcohol, tobacco or illicit drugs   Current medications and supplements  Functional ability and status  Nutritional status  Physical activity  Advanced directives  List of other physicians  Hospitalizations, surgeries, and ER visits in previous 12 months  Vitals  Screenings to include cognitive, depression, and falls  Referrals and appointments  In addition, I have reviewed and discussed with patient certain preventive protocols, quality metrics, and best practice recommendations. A written personalized care plan for preventive services as well as general preventive health recommendations were provided to patient.     [name], FNP 06/21/2019

## 2019-06-13 NOTE — Patient Instructions (Signed)
Advance Directive  Advance directives are legal documents that let you make choices ahead of time about your health care and medical treatment in case you become unable to communicate for yourself. Advance directives are a way for you to communicate your wishes to family, friends, and health care providers. This can help convey your decisions about end-of-life care if you become unable to communicate. Discussing and writing advance directives should happen over time rather than all at once. Advance directives can be changed depending on your situation and what you want, even after you have signed the advance directives. If you do not have an advance directive, some states assign family decision makers to act on your behalf based on how closely you are related to them. Each state has its own laws regarding advance directives. You may want to check with your health care provider, attorney, or state representative about the laws in your state. There are different types of advance directives, such as:  Medical power of attorney.  Living will.  Do not resuscitate (DNR) or do not attempt resuscitation (DNAR) order. Health care proxy and medical power of attorney A health care proxy, also called a health care agent, is a person who is appointed to make medical decisions for you in cases in which you are unable to make the decisions yourself. Generally, people choose someone they know well and trust to represent their preferences. Make sure to ask this person for an agreement to act as your proxy. A proxy may have to exercise judgment in the event of a medical decision for which your wishes are not known. A medical power of attorney is a legal document that names your health care proxy. Depending on the laws in your state, after the document is written, it may also need to be:  Signed.  Notarized.  Dated.  Copied.  Witnessed.  Incorporated into your medical record. You may also want to appoint  someone to manage your financial affairs in a situation in which you are unable to do so. This is called a durable power of attorney for finances. It is a separate legal document from the durable power of attorney for health care. You may choose the same person or someone different from your health care proxy to act as your agent in financial matters. If you do not appoint a proxy, or if there is a concern that the proxy is not acting in your best interests, a court-appointed guardian may be designated to act on your behalf. Living will A living will is a set of instructions documenting your wishes about medical care when you cannot express them yourself. Health care providers should keep a copy of your living will in your medical record. You may want to give a copy to family members or friends. To alert caregivers in case of an emergency, you can place a card in your wallet to let them know that you have a living will and where they can find it. A living will is used if you become:  Terminally ill.  Incapacitated.  Unable to communicate or make decisions. Items to consider in your living will include:  The use or non-use of life-sustaining equipment, such as dialysis machines and breathing machines (ventilators).  A DNR or DNAR order, which is the instruction not to use cardiopulmonary resuscitation (CPR) if breathing or heartbeat stops.  The use or non-use of tube feeding.  Withholding of food and fluids.  Comfort (palliative) care when the goal becomes comfort rather   than a cure.  Organ and tissue donation. A living will does not give instructions for distributing your money and property if you should pass away. It is recommended that you seek the advice of a lawyer when writing a will. Decisions about taxes, beneficiaries, and asset distribution will be legally binding. This process can relieve your family and friends of any concerns surrounding disputes or questions that may come up about  the distribution of your assets. DNR or DNAR A DNR or DNAR order is a request not to have CPR in the event that your heart stops beating or you stop breathing. If a DNR or DNAR order has not been made and shared, a health care provider will try to help any patient whose heart has stopped or who has stopped breathing. If you plan to have surgery, talk with your health care provider about how your DNR or DNAR order will be followed if problems occur. Summary  Advance directives are the legal documents that allow you to make choices ahead of time about your health care and medical treatment in case you become unable to communicate for yourself.  The process of discussing and writing advance directives should happen over time. You can change the advance directives, even after you have signed them.  Advance directives include DNR or DNAR orders, living wills, and designating an agent as your medical power of attorney. This information is not intended to replace advice given to you by your health care provider. Make sure you discuss any questions you have with your health care provider. Document Released: 12/09/2007 Document Revised: 10/06/2018 Document Reviewed: 07/21/2016 Elsevier Patient Education  2020 [employer]  

## 2019-06-15 ENCOUNTER — Other Ambulatory Visit (INDEPENDENT_AMBULATORY_CARE_PROVIDER_SITE_OTHER): Payer: Self-pay

## 2019-06-15 ENCOUNTER — Telehealth: Payer: Self-pay | Admitting: *Deleted

## 2019-06-15 ENCOUNTER — Other Ambulatory Visit: Payer: Self-pay

## 2019-06-15 DIAGNOSIS — E538 Deficiency of other specified B group vitamins: Secondary | ICD-10-CM | POA: Diagnosis not present

## 2019-06-15 DIAGNOSIS — K5900 Constipation, unspecified: Secondary | ICD-10-CM | POA: Diagnosis not present

## 2019-06-15 DIAGNOSIS — G35 Multiple sclerosis: Secondary | ICD-10-CM | POA: Diagnosis not present

## 2019-06-15 DIAGNOSIS — Z0289 Encounter for other administrative examinations: Secondary | ICD-10-CM

## 2019-06-15 DIAGNOSIS — R609 Edema, unspecified: Secondary | ICD-10-CM | POA: Diagnosis not present

## 2019-06-15 DIAGNOSIS — E559 Vitamin D deficiency, unspecified: Secondary | ICD-10-CM | POA: Diagnosis not present

## 2019-06-15 NOTE — Telephone Encounter (Signed)
Called patient and asked if she had labs drawn. She stated she went to lab but they "were unable to get labs we wanted". She is coming to [facility] this afternoon to have labs drawn. I advised she must wear a mask. Patient verbalized understanding, appreciation.

## 2019-06-16 LAB — CMP14 + ANION GAP
ALT: 20 IU/L (ref 0–32)
AST: 14 IU/L (ref 0–40)
Albumin/Globulin Ratio: 1.7 (ref 1.2–2.2)
Albumin: 4.3 g/dL (ref 3.8–4.9)
Alkaline Phosphatase: 60 IU/L (ref 39–117)
Anion Gap: 15 mmol/L (ref 10.0–18.0)
BUN/Creatinine Ratio: 17 (ref 9–23)
BUN: 13 mg/dL (ref 6–24)
Bilirubin Total: 0.3 mg/dL (ref 0.0–1.2)
CO2: 22 mmol/L (ref 20–29)
Calcium: 9.5 mg/dL (ref 8.7–10.2)
Chloride: 104 mmol/L (ref 96–106)
Creatinine, Ser: 0.76 mg/dL (ref 0.57–1.00)
GFR calc Af Amer: 104 mL/min/{1.73_m2} (ref >59)
GFR calc non Af Amer: 90 mL/min/{1.73_m2} (ref >59)
Globulin, Total: 2.6 g/dL (ref 1.5–4.5)
Glucose: 109 mg/dL — ABNORMAL HIGH (ref 65–99)
Potassium: 4.2 mmol/L (ref 3.5–5.2)
Sodium: 141 mmol/L (ref 134–144)
Total Protein: 6.9 g/dL (ref 6.0–8.5)

## 2019-06-16 LAB — CBC WITH DIFFERENTIAL/PLATELET
Basophils Absolute: 0 10*3/uL (ref 0.0–0.2)
Basos: 0 %
EOS (ABSOLUTE): 0.2 10*3/uL (ref 0.0–0.4)
Eos: 2 %
Hematocrit: 39.8 % (ref 34.0–46.6)
Hemoglobin: 13.5 g/dL (ref 11.1–15.9)
Immature Grans (Abs): 0 10*3/uL (ref 0.0–0.1)
Immature Granulocytes: 0 %
Lymphocytes Absolute: 1.3 10*3/uL (ref 0.7–3.1)
Lymphs: 16 %
MCH: 29 pg (ref 26.6–33.0)
MCHC: 33.9 g/dL (ref 31.5–35.7)
MCV: 86 fL (ref 79–97)
Monocytes Absolute: 0.8 10*3/uL (ref 0.1–0.9)
Monocytes: 10 %
Neutrophils Absolute: 5.9 10*3/uL (ref 1.4–7.0)
Neutrophils: 72 %
Platelets: 277 10*3/uL (ref 150–450)
RBC: 4.65 x10E6/uL (ref 3.77–5.28)
RDW: 13.7 % (ref 11.7–15.4)
WBC: 8.2 10*3/uL (ref 3.4–10.8)

## 2019-06-16 LAB — D-DIMER, QUANTITATIVE: D-DIMER: 0.88 mg/L FEU — ABNORMAL HIGH (ref 0.00–0.49)

## 2019-06-16 LAB — BRAIN NATRIURETIC PEPTIDE: BNP: 7.1 pg/mL (ref 0.0–100.0)

## 2019-06-16 LAB — VITAMIN B12: Vitamin B-12: >2000 pg/mL — ABNORMAL HIGH (ref 232–1245)

## 2019-06-16 LAB — VITAMIN D 25 HYDROXY (VIT D DEFICIENCY, FRACTURES): Vit D, 25-Hydroxy: 52.2 ng/mL (ref 30.0–100.0)

## 2019-06-17 ENCOUNTER — Ambulatory Visit: Payer: Self-pay

## 2019-06-17 DIAGNOSIS — M21372 Foot drop, left foot: Secondary | ICD-10-CM

## 2019-06-17 DIAGNOSIS — G35 Multiple sclerosis: Secondary | ICD-10-CM

## 2019-06-17 DIAGNOSIS — R0600 Dyspnea, unspecified: Secondary | ICD-10-CM

## 2019-06-17 NOTE — Chronic Care Management (AMB) (Signed)
  Chronic Care Management   Initial Visit Note  06/17/2019 Name: [name] MRN: [id number] DOB: November 11, 1965  Referred by: [name], FNP Reason for referral : Care Coordination (CC RNCM Case Collaboration )   [name] is a 53 y.o. year old female who is a primary care patient of [name], [place]. The care management team was consulted for assistance with chronic disease management and care coordination needs.   Review of patient status, including review of consultants reports, relevant laboratory and other test results, and collaboration with appropriate care team members and the patient's provider was performed as part of comprehensive patient evaluation and provision of chronic care management services.    I initiated and established the plan of care for [name] during one on one collaboration with my clinical care management colleague Daneen Schick BSW who is also engaged with this patient to address social work needs.   Outpatient Encounter Medications as of 06/17/2019  Medication Sig  . Coenzyme Q10 (CO Q 10 PO) Take by mouth.  . CVS D3 50 MCG (2000 UT) CAPS TAKE 1 CAPSULE BY MOUTH DAILY OR AS DIRECTED (Patient taking differently: Take 1 capsule by mouth daily. )  . Folic 123456 3 Acid (MI-OMEGA PO) Take by mouth.  . Misc Natural Products (TURMERIC CURCUMIN) CAPS Take by mouth 2 (two) times daily.  . Probiotic Product (PROBIOTIC PO) Take by mouth.  . progesterone (PROMETRIUM) 100 MG capsule Take 100 mg by mouth at bedtime.   . TECFIDERA 240 MG CPDR TAKE 1 CAPSULE (240 MG) BY MOUTH TWICE DAILY  . TURMERIC PO Take 1 tablet by mouth. 1000mg  daily  . vitamin B-12 (CVS B-12) 500 MCG tablet Take 2 tablets (1,000 mcg total) by mouth daily.   No facility-administered encounter medications on file as of 06/17/2019.      Goals Addressed    . Assist with Chronic Care Management and Care Coordination needs       Current Barriers:  [name] Knowledge Barriers  related to resources and support available to address needs related to Chronic disease management and Care Coordination needs   Case Manager Clinical Goal(s):  [name] Over the next 30 days, patient will work with the CCM team to address needs related to Chronic disease management for Multiple Sclerosis  Interventions:  . Collaborated with BSW and initiated plan of care to address needs related to Chronic Care Management and Care Coordination needs  Patient Self Care Activities:  . Self administers medications as prescribed . Attends all scheduled provider appointments . Calls pharmacy for medication refills . Calls provider office for new concerns or questions  Initial goal documentation         Telephone follow up appointment with care management team member scheduled for: 07/06/19  Barb Merino, RN, BSN, CCM Care Management Coordinator [facility]  Direct Phone: [phone]

## 2019-06-17 NOTE — Chronic Care Management (AMB) (Signed)
  Care Management Note   [name] is a 53 y.o. year old female who is a primary care patient of [name], [place] . The CM team was consulted for assistance with chronic care management and care coordination.   Review of patient status, including review of consultants reports, rand collaboration with appropriate care team members and the patient's provider was performed as part of comprehensive patient evaluation and provision of care management services. Telephone outreach to patient today to introduce CM services.   SDOH (Social Determinants of Health) screening performed today: None. See Care Plan for related entries.   The patient recognizes past issues with transportation but reports her daughter is now 35 and able to assist. Brief education on SCAT transportation services provided. Encouraged the patient to contact SW if/when SCAT services are desired.  The patient reports chronic pain as her main concern during today's call. The patient acknowledges diagnosis of multiple sclerosis and reports use of wheelchair for ambulation when leaving the home or going long distances.  Outpatient Encounter Medications as of 06/17/2019  Medication Sig  . Coenzyme Q10 (CO Q 10 PO) Take by mouth.  . CVS D3 50 MCG (2000 UT) CAPS TAKE 1 CAPSULE BY MOUTH DAILY OR AS DIRECTED (Patient taking differently: Take 1 capsule by mouth daily. )  . Folic 123456 3 Acid (MI-OMEGA PO) Take by mouth.  . Misc Natural Products (TURMERIC CURCUMIN) CAPS Take by mouth 2 (two) times daily.  . Probiotic Product (PROBIOTIC PO) Take by mouth.  . progesterone (PROMETRIUM) 100 MG capsule Take 100 mg by mouth at bedtime.   . TECFIDERA 240 MG CPDR TAKE 1 CAPSULE (240 MG) BY MOUTH TWICE DAILY  . TURMERIC PO Take 1 tablet by mouth. 1000mg  daily  . vitamin B-12 (CVS B-12) 500 MCG tablet Take 2 tablets (1,000 mcg total) by mouth daily.   No facility-administered encounter medications on file as of 06/17/2019.      I reached out to [employer] by phone today.   Ms. Izbicki was given information about Care Management services today including:  1. Care Management services includes personalized support from designated clinical staff supervised by her physician, including individualized plan of care and coordination with other care providers 2. 24/7 contact phone numbers for assistance for urgent and routine care needs. 3. The patient may stop case management services at any time by phone call to the office staff.  Patient agreed to services and verbal consent obtained.    Follow Up Plan: Collaboration with RN Case Manager who will follow up with the patient regarding disease management.   Daneen Schick, BSW, CDP Social Worker, Certified Dementia Practitioner [place] [facility] [phone]

## 2019-06-17 NOTE — Patient Instructions (Signed)
Visit Information  Ms. Fano was given information about Care Management services today including:  1. Care Management services include personalized support from designated clinical staff supervised by her physician, including individualized plan of care and coordination with other care providers 2. 24/7 contact phone numbers for assistance for urgent and routine care needs. 3. The patient may stop CCM services at any time (effective at the end of the month) by phone call to the office staff.  Patient agreed to services and verbal consent obtained.    Collaboration with RN Case Manager who will follow up with the patient by phone for disease management.  Daneen Schick, BSW, CDP Social Worker, Certified Dementia Practitioner [place] [facility] [phone]

## 2019-06-21 ENCOUNTER — Other Ambulatory Visit: Payer: Self-pay | Admitting: Nurse Practitioner

## 2019-06-21 DIAGNOSIS — Z1211 Encounter for screening for malignant neoplasm of colon: Secondary | ICD-10-CM | POA: Diagnosis not present

## 2019-06-21 DIAGNOSIS — Z0000 Encounter for general adult medical examination without abnormal findings: Secondary | ICD-10-CM | POA: Diagnosis not present

## 2019-06-21 DIAGNOSIS — K5900 Constipation, unspecified: Secondary | ICD-10-CM | POA: Diagnosis not present

## 2019-06-21 DIAGNOSIS — Z23 Encounter for immunization: Secondary | ICD-10-CM | POA: Diagnosis not present

## 2019-06-21 DIAGNOSIS — R609 Edema, unspecified: Secondary | ICD-10-CM

## 2019-06-21 DIAGNOSIS — M545 Low back pain: Secondary | ICD-10-CM | POA: Diagnosis not present

## 2019-06-21 DIAGNOSIS — G35 Multiple sclerosis: Secondary | ICD-10-CM | POA: Diagnosis not present

## 2019-06-21 DIAGNOSIS — R0600 Dyspnea, unspecified: Secondary | ICD-10-CM | POA: Diagnosis not present

## 2019-06-28 ENCOUNTER — Telehealth: Payer: Self-pay | Admitting: *Deleted

## 2019-06-28 NOTE — Telephone Encounter (Signed)
JCV lab results on NP's desk for review; charted in snapshot specialty comments.

## 2019-06-28 NOTE — Telephone Encounter (Signed)
Reviewed. Index 0.24, inhibition assay negative

## 2019-07-04 DIAGNOSIS — M79675 Pain in left toe(s): Secondary | ICD-10-CM | POA: Diagnosis not present

## 2019-07-04 DIAGNOSIS — B351 Tinea unguium: Secondary | ICD-10-CM | POA: Diagnosis not present

## 2019-07-04 DIAGNOSIS — L600 Ingrowing nail: Secondary | ICD-10-CM | POA: Diagnosis not present

## 2019-07-04 DIAGNOSIS — M79674 Pain in right toe(s): Secondary | ICD-10-CM | POA: Diagnosis not present

## 2019-07-06 ENCOUNTER — Telehealth: Payer: Self-pay

## 2019-07-19 ENCOUNTER — Ambulatory Visit
Admission: RE | Admit: 2019-07-19 | Discharge: 2019-07-19 | Disposition: A | Discharge status: Home / Self Care | Payer: BC Managed Care – PPO | Source: Ambulatory Visit | Attending: Nurse Practitioner | Admitting: Nurse Practitioner

## 2019-07-19 DIAGNOSIS — R600 Localized edema: Secondary | ICD-10-CM | POA: Diagnosis not present

## 2019-07-19 DIAGNOSIS — R609 Edema, unspecified: Secondary | ICD-10-CM

## 2019-07-21 ENCOUNTER — Telehealth: Payer: Self-pay

## 2019-07-21 NOTE — Telephone Encounter (Signed)
Patient notified of her results and she wanted to know if there is any medication or supplements she could take. She stated it was ok for me to leave a v/m. YRL,RMA

## 2019-07-21 NOTE — Telephone Encounter (Signed)
She can drink lemon water and limit her intake of breads and sugars.

## 2019-07-21 NOTE — Telephone Encounter (Signed)
Patient returned my call regarding her labs, she stated she doesn't know why she cant hear her phone since she is sitting beside it.  I RETURNED PT CALL AND LEFT HER A V/M TO CALL THE OFFICE OR LET ME KNOW IF I COULD LEAVE THERESULTS ON HER V/M. [name]

## 2019-07-21 NOTE — Telephone Encounter (Signed)
I left pt a v/m notifying her. YRL,RMA

## 2019-08-03 ENCOUNTER — Encounter: Payer: Self-pay | Admitting: Nurse Practitioner

## 2019-08-16 ENCOUNTER — Telehealth: Payer: Self-pay

## 2019-09-01 ENCOUNTER — Telehealth: Payer: Self-pay

## 2019-09-01 NOTE — Telephone Encounter (Signed)
Patient called wanting to cancel her appointment on the 28th she stated the swelling has gone down and that she has soo much going and also doesn't want to come in until the covid numbers are down.  I returned pt call and left her a v/m to call the office so we can reschedule her appointment. YRL,RMA

## 2019-09-12 ENCOUNTER — Ambulatory Visit: Payer: BC Managed Care – PPO | Admitting: Nurse Practitioner

## 2019-09-13 ENCOUNTER — Ambulatory Visit: Payer: Medicare Other | Admitting: Nurse Practitioner

## 2019-09-28 ENCOUNTER — Telehealth: Payer: Self-pay

## 2019-10-11 ENCOUNTER — Other Ambulatory Visit: Payer: Self-pay | Admitting: Neurology

## 2019-10-31 ENCOUNTER — Telehealth: Payer: Self-pay | Admitting: *Deleted

## 2019-10-31 ENCOUNTER — Encounter: Payer: Self-pay | Admitting: Neurology

## 2019-10-31 ENCOUNTER — Telehealth (INDEPENDENT_AMBULATORY_CARE_PROVIDER_SITE_OTHER): Payer: Medicare Other | Admitting: Neurology

## 2019-10-31 DIAGNOSIS — R269 Unspecified abnormalities of gait and mobility: Secondary | ICD-10-CM | POA: Diagnosis not present

## 2019-10-31 DIAGNOSIS — R5383 Other fatigue: Secondary | ICD-10-CM

## 2019-10-31 DIAGNOSIS — R350 Frequency of micturition: Secondary | ICD-10-CM | POA: Diagnosis not present

## 2019-10-31 DIAGNOSIS — Z79899 Other long term (current) drug therapy: Secondary | ICD-10-CM

## 2019-10-31 DIAGNOSIS — G35 Multiple sclerosis: Secondary | ICD-10-CM | POA: Diagnosis not present

## 2019-10-31 DIAGNOSIS — M21372 Foot drop, left foot: Secondary | ICD-10-CM

## 2019-10-31 NOTE — Progress Notes (Signed)
[facility]  PATIENT: [name] DOB: 1966-03-07  REFERRING CLINICIAN: Willey Blade HISTORY FROM: Patient  REASON FOR VISIT: Multiple sclerosis   HISTORICAL  CHIEF COMPLAINT:  Chief Complaint  Patient presents with  . Multiple Sclerosis    HISTORY OF PRESENT ILLNESS:  [name] is a 54 y.o. woman with relapsing remitting MS diagnosed in 1997.    Update 10/31/2019: I connected with [name]  on 10/31/19 at 11:00 AM EST by a video enabled telemedicine application and verified that I am speaking with the correct person.  I discussed the limitations of evaluation and management by telemedicine and the availability of in person appointments. The patient expressed understanding and agreed to proceed.  History of Present Illness: [name] feels her MS has been stable.  [name] has no exacerbations or new symptoms.   [name] is on Tecfidera and tolerates it well.   [name] feels mostly stale on Tecfidera 240 mg bid and tolerates it well.    Labs 06/15/2019 show lymphocyte count of 1.3.  [name] uses a walker due to leg weakness with some spasticity.   [name] can go 100 feet with a walker.  [name] had one fall when [name] slipped with a new pair of shoes.   [name] arms are strong but the left arm and hand are slightly slower than [name] right.    Bladder is doing better since using a supplement [employer] copy).    Vision is fine.     [name] has some fatigue but it does not prevent [name] from activities in general.  [name] sleeps well most well.   [name] is eating well and takes vitamins.   [name] takes vitamin D supplements.  [name] denies depression or anxiety.  Cognition is doing well.  [name] joint pain is milder now than it was at the last visit.      Observations/Objective:  [name] is a well-developed well-nourished woman in no acute distress.  The head is normocephalic and atraumatic.  Sclera are anicteric.  Visible skin appears normal.  The neck has a good range of motion.   [name] is alert and fully oriented  with fluent speech and good attention, knowledge and memory.  Extraocular muscles show mild bilateral INO's.  Facial strength is normal.  Palatal elevation and tongue protrusion are midline.  [name] appears to have normal strength in the arms.  Rapid alternating movements and finger-nose-finger are performed well on right but slightly reduced on left  Assessment and Plan: Multiple sclerosis (HCC)  High risk medication use  Other fatigue  Urinary frequency  Gait disturbance  Left foot drop   1.   Continue Tecfidera.  Advised to get CBC with differential next time [name] is in our office or hopefully will get done even sooner with primary care.   2.   [name] will continue to take vitamin D. 3.   Continue to use a walker around the house and for short distances and wheelchair outside of the home. 4.   We discussed [name] risks of COVID 19.  I recommended that [name] get the vaccination when [name] is able to to help reduce [name] risk of infection. 5.     rtc 6 months, sooner if problems    Follow Up Instructions:  I discussed the assessment and treatment plan with the patient. The patient was provided an opportunity to ask questions and all were answered. The patient agreed with the plan and demonstrated an understanding of the instructions.    The  patient was advised to call back or seek an in-person evaluation if the symptoms worsen or if the condition fails to improve as anticipated.  I provided 28 minutes of non-face-to-face time during this encounter.  Update 02/08/2019 (virtual) [name] feels mostly stale on Tecfidera 240 mg bid and tolerates it well.  Unfortunately, most recent labs at PCP did not included differential but lymphocytes were a little low at 0.5 when [name] went to the ED for a UTI in 10/28/2017.  I discussed with [name] that we would need to recheck this and make sure that the lymphocyte count has not dropped too much.  [name] denies GI upset or flushing.  [name] main problem is walking.  [name] uses a walker  almost exclusively now.   No recent falls.    We had discussed Ampyra but [name] was concerned about seizure risk.   [name] left leg is weaker than the right but they are close to symmetric now.  Arms are strong and symmetric.   [name] has spasticity in both legs.   No numbness.   [name] has urinary frequency.  Vision is normal and symmetric.    [name] has some fatigue and feels vitamins like CoQ10 have helped.  [name] sleeps well at night and often takes a nap.    [name] feels mood is doing well.  Cognition is doing okay.  [name] joint pain is better with supplements including tumeric and curcumin.     [name] takes Vit D supplements as well.   Update 05/31/2018: [name] feels [name] is mostly stable.   With a walker [name] can walk around [name] house.    [name] feels [name] is mildly better compared to earlier in the year.     [name] has ben reluctant to try Ampyra due to the risk of seizures.    [name] denies numbness (occ tingling).   [name] left leg is usually weaker than the right but sometimes legs are equal.     [name] has urinary urgency and has frequency (q 2-3 hours).     [name] falls asleep easily but has 2 x nocturia.   [name] usually falls back asleep easily.    [name] has some fatigue and   [name] has some joint pain.   [name] stopped meloxicam and is on tumeric and feels it is better than the NSAID.      Update 11/19/2017: [name] feels her MS is stable. [name] is on Tecfidera. [name] tolerates it well. [name] has not had any recent exacerbations.    The MRI of the brain performed 06/22/2017 was compared to the MRI dated 06/06/2015. [name] has lesions in the hemispheres and brainstem consistent with MS. None of them were acute and there were no changes over the 2 year interval.     [name] has multiple foci in both the cervical and thoracic spine none of them appear to be acute.  [name] gait is about the same as 6 months ago.    [name] uses a walker because ankles are weak.   Outside, sometimes [name] will use a wheelchair.    [name] notes pain in the ankles and feet.       There is ankle  discomfort mor ethan foot.   [name] has a  little hand numbness.    Bladder is about the same with frequency and very rare incontinence.    [name] notes fatigue.   Sometimes [name] is sleepy during the day.   [name] spends a lot of time in [name] home.  [name] denies depression.   [name] used to be more active and did water therapy that [name] enjoyed.    [name] sleeps well at night Cognition is fine.    From 04/23/2017:  MS:    [name] is on Tecfidera and [name] tolerates it well.  No flushing or GI issues.   [name] denies any recent exacerbation..   [name] last MRI of the brain was 06/08/2015. It did not show any enhancing lesions. An MRI of the cervical spine 06/08/2015 showed several foci within the spinal cord consistent with MS. These images were personally reviewed today.  Gait/strength/sensation:  [name] gait is a little worse compared to [name] last visit.    [name] uses a walker around the house and wheelchair for long distances outside.   The left leg is worse than the right leg and [name] has left AFO brace for foot drop but does not use it recently.   [name] denies any weakness in the arms. He r left leg is spastic.   [name] notes left leg ataxia.   Right leg issues are milder.  We had discussed Ampyra in the past. 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        Bladder:   [name] feels [name] bladder is better with only mild frequency now.   [name] does not need any medication.    Vision:  [name] denies any MS related vision changes.  Fatigue:   [name] notes mild fatigue most days. The fatigue worsens as the day goes on. [name] is often sleepy and takes naps most days. [name] sleeps well at night.   [name] does not snore.       .  Mood/cognition:  [name] feels mood is doing well though [name] is sometimes irritable. [name] denies any cognitive dysfunction. There is no difficulty with verbal fluency or short-term memory.  LBP/leg pain:   [name] back and radicular left leg pain resolved.    Pain increases with  bending.   NSAIDs help.     A chiropractor told [name] one leg is shorter than another and [name] feels manipulations are helping some.    MS History:   In 1997, [name] presented with backache, poor gait and visual blurring. [name] had an MRI and a lumbar puncture. The studies were consistent with multiple sclerosis.  [name] turned down a disease modifying therapy at that time. [name] legs and [name] walking got completely better and [name] was back at baseline after the steroids. Over the next 15 years or so [name] had no additional symptoms. [name] had worsening symptoms in the left leg that was affecting [name] balance. MRI 2-3 years ago showed progression. [name] opted not to go on medication at that time.   [name] went to the [facility] at [facility] for several visits. At that time [name] was started on Tecfidera and [name] tolerates it well and has not had much flushing or GI symptoms. [name] has not had definite exacerbations. However, [name] gait has worsened and [name] has gone from a cane to a walker.  REVIEW OF SYSTEMS:  Constitutional: No fevers, chills, sweats, or change in appetite Eyes: No visual changes, double vision, eye pain Ear, nose and throat: No hearing loss, ear pain, nasal congestion, sore throat Cardiovascular: No chest pain, palpitations Respiratory:  No shortness of breath at rest or with exertion.   No wheezes GastrointestinaI: No nausea, vomiting, diarrhea, abdominal pain, fecal incontinence Genitourinary:  No dysuria, urinary retention or frequency.  No nocturia. Musculoskeletal:  No neck pain.   [name] reports some back pain and occasionally proximal leg pain. Integumentary: No rash, pruritus, skin lesions Neurological: as above Psychiatric: [name] denies depression but notes that [name] is sometimes moody. There is no anxiety. Endocrine: No palpitations, diaphoresis, change in appetite or increased thirst Hematologic/Lymphatic:  No anemia, purpura, petechiae. Allergic/Immunologic: No itchy/runny eyes, nasal congestion, recent allergic  reactions, rashes  ALLERGIES: Allergies  Allergen Reactions  . Food Other (See Comments)    WHAT FOOD CAUSES THIS REACTION? Eyes swelling and throat swelling  . Oxycodone Hcl Hives  . Soy Allergy     Other reaction(s): SWELLING    HOME MEDICATIONS: Outpatient Medications Prior to Visit  Medication Sig Dispense Refill  . [employer] D3 50 MCG (2000 UT) CAPS TAKE 1 CAPSULE BY MOUTH DAILY OR AS DIRECTED 90 capsule 3  . Multiple Vitamin (MULTIVITAMIN ADULT PO) Take 1 Dose by mouth daily.    [name] OVER THE COUNTER MEDICATION Take 1 Dose by mouth daily. [place]-  Immune supplement    . TECFIDERA 240 MG CPDR TAKE 1 CAPSULE (240 MG) BY MOUTH TWICE DAILY 180 capsule 3  . vitamin B-12 ([employer]S B-12) 500 MCG tablet Take 2 tablets (1,000 mcg total) by mouth daily. 180 tablet 1   No facility-administered medications prior to visit.    PAST MEDICAL HISTORY: Past Medical History:  Diagnosis Date  . Movement disorder   . Multiple sclerosis ([place])     PAST SURGICAL HISTORY: Past Surgical History:  Procedure Laterality Date  . CESAREAN SECTION    . UTERINE FIBROID SURGERY    . WISDOM TOOTH EXTRACTION      FAMILY HISTORY: Family History  Problem Relation Age of Onset  . Hypertension Mother   . Hypertension Father   . Diabetes Father     SOCIAL HISTORY:  Social History   Socioeconomic History  . Marital status: Married    Spouse name: Not on file  . Number of children: Not on file  . Years of education: Not on file  . Highest education level: Not on file  Occupational History  . Not on file  Tobacco Use  . Smoking status: Never Smoker  . Smokeless tobacco: Never Used  Substance and Sexual Activity  . Alcohol use: No  . Drug use: No  . Sexual activity: Not on file  Other Topics Concern  . Not on file  Social History Narrative  . Not on file   Social Determinants of Health   Financial Resource Strain:   . Difficulty of Paying Living Expenses: Not on file  Food Insecurity:     . Worried About Charity fundraiser in the Last Year: Not on file  . Ran Out of Food in the Last Year: Not on file  Transportation Needs:   . Lack of Transportation (Medical): Not on file  . Lack of Transportation (Non-Medical): Not on file  Physical Activity:   . Days of Exercise per Week: Not on file  . Minutes of Exercise per Session: Not on file  Stress:   . Feeling of Stress : Not on file  Social Connections:   . Frequency of Communication with Friends and Family: Not on file  . Frequency of Social Gatherings with Friends and Family: Not on file  . Attends Religious Services: Not on file  . Active Member of Clubs or Organizations: Not on file  . Attends Archivist Meetings:  Not on file  . Marital Status: Not on file  Intimate Partner Violence:   . Fear of Current or Ex-Partner: Not on file  . Emotionally Abused: Not on file  . Physically Abused: Not on file  . Sexually Abused: Not on file     PHYSICAL EXAM from last live visit  There were no vitals filed for this visit.  There is no height or weight on file to calculate BMI.   General: The patient is well-developed and well-nourished and in no acute distress.   [name] uses a walker   Neurologic Exam  Mental status: The patient is alert and oriented x 3 at the time of the examination. The patient has apparent normal recent and remote memory, with an apparently normal attention span and concentration ability.   Speech is normal.  Cranial nerves: [name] has bilateral INO's and nystagmus.  Facial symmetry is present. Facial strength and sensation is normal. Trapezius and sternocleidomastoid strength is normal. No dysarthria is noted.  The tongue is midline, and the patient has symmetric elevation of the soft palate. No obvious hearing deficits are noted.  Motor:  Muscle bulk is normal.  Muscle tone is increased in the legs, left greater than right.  Muscle tone was fairly normal in the arms.  Strength was normal in the  right arm and just minimally reduced in the left arm (triceps) though left rapid alternating movements were slowed.  In the legs, [name] has reduced strength bilaterally , 3/5 on the right with 4-/5 quads and 2-3/5 on the left.    Sensory: In the arms, there was normal sensation to touch and vibration. [name] had normal touch sensation in the legs but reduced vibratory sensation in the left leg.  \  Coordination: Cerebellar testing reveals good finger-nose-finger and is unable to do heel-to-shin  Gait and station: Station is unstable and [name] does better with unilateral support.   Positive Romberg,   . Wide stride with left foot drop and needs unilateral support for balance.    Reflexes: Deep tendon reflexes are increased on let more than right legs.     Zakiah Beckerman A. Felecia Shelling, MD, PhD [id number], [id number] AM Certified in Neurology, Clinical Neurophysiology, Sleep Medicine, Pain Medicine and Neuroimaging  [facility] [address], [place], [place] [address] [phone]

## 2019-10-31 NOTE — Telephone Encounter (Signed)
Called pt, updated pt chart for mychart VV today at 11am.

## 2019-11-07 ENCOUNTER — Telehealth: Payer: Self-pay

## 2019-11-08 ENCOUNTER — Ambulatory Visit: Payer: Self-pay

## 2019-11-08 ENCOUNTER — Telehealth: Payer: Self-pay

## 2019-11-08 DIAGNOSIS — M21372 Foot drop, left foot: Secondary | ICD-10-CM

## 2019-11-08 DIAGNOSIS — R269 Unspecified abnormalities of gait and mobility: Secondary | ICD-10-CM

## 2019-11-08 DIAGNOSIS — K5900 Constipation, unspecified: Secondary | ICD-10-CM

## 2019-11-08 DIAGNOSIS — G35 Multiple sclerosis: Secondary | ICD-10-CM

## 2019-11-09 ENCOUNTER — Telehealth: Payer: Self-pay

## 2019-11-09 ENCOUNTER — Other Ambulatory Visit: Payer: Self-pay

## 2019-11-10 NOTE — Patient Instructions (Signed)
Visit Information  Goals Addressed      Patient Stated   . "slow down the progression of my MS" (pt-stated)       CARE PLAN ENTRY (see longtitudinal plan of care for additional care plan information)  Current Barriers:  [name] Knowledge Deficits related to disease process and Self Health Management of MS  . Chronic Disease Management support and education needs related to Secondary Chronic Progressive MS, Gait disturbance, High Risk Medication, Constipation   Nurse Case Manager Clinical Goal(s):  [name] Over the next 90 days, patient will work with the [facility] CM to address needs related to education and support to improve Self management of MS . Over the next 90 days, patient will have increased knowledge and understanding about alternate DME options to consider to help improve her gait/mobility, such as forearm crutches  CCM RN CM Interventions:  11/09/19 call completed with patient  . Evaluation of current treatment plan related to Multiple Sclerosis and patient's adherence to plan as established by provider . Determined patient developed symptoms of MS about 25 years ago but only started treatment with Tecfidera 5-7 years ago . Determined patient currently uses a walker for ambulation due to having balance/gait issues secondary to the MS . Educated patient about alternative DME to assist with ambulation such as forearm crutches  . Determined patient is established with Neurology to help manage her MS . Provided education to patient re: MS disease process, types of MS and recommendations for treatment management and MD follow up . Reviewed medications with patient and discussed indication, frequency and dosage of prescribed Tecfidera; patient is taking 240 mg bid without noted SE; reports no recent exacerbations  . Discussed plans with patient for ongoing care management follow up and provided patient with direct contact information for care management team . Provided patient with printed  educational materials related to Forearm crutches: Finding Balance with MS   Patient Self Care Activities:  . Self administers medications as prescribed . Attends all scheduled provider appointments . Calls pharmacy for medication refills . Performs ADL's independently . Performs IADL's independently . Calls provider office for new concerns or questions  Initial goal documentation     . "to better manage my constipation" (pt-stated)       CARE PLAN ENTRY (see longtitudinal plan of care for additional care plan information)  Current Barriers:  [name] Knowledge Deficits related to disease process and Self Health management of Multiple Sclerosis . Chronic Disease Management support and education needs related to Secondary Chronic Progressive MS, Gait disturbance, Constipation, High Risk Medication   Nurse Case Manager Clinical Goal(s):  [name] Over the next 90 days, patient will work with CCM RN CM and PCP  to address needs related to Constipation . Over the next 90 days, patient will have increased knowledge and understanding about the disease process, evaluation and treatment of Neurogenic Bowel disease secondary to [place] RN CM Interventions:  11/10/19 call completed with patient  . Evaluation of current treatment plan related to MS and patient's adherence to plan as established by provider. . Provided education to patient re: risk for neurogenic bowel secondary to disease progression related to MS . Reviewed medications with patient and discussed patient plans to use Metamucil OTC, she will try Miralax if the Metamucil is not effective . Discussed plans with patient for ongoing care management follow up and provided patient with direct contact information for care management team . Provided patient with printed educational materials related to  Bowel and Bladder Dysfunction in Multiple Sclerosis; 6 tips to Being Water Wise  Patient Self Care Activities:  . Self administers medications as  prescribed . Attends all scheduled provider appointments . Calls pharmacy for medication refills . Performs ADL's independently . Performs IADL's independently . Calls provider office for new concerns or questions  Initial goal documentation       Other   . COMPLETED: Assist with Chronic Care Management and Care Coordination needs       Current Barriers:  [name] Knowledge Barriers related to resources and support available to address needs related to Chronic disease management and Care Coordination needs   Case Manager Clinical Goal(s):  [name] Over the next 30 days, patient will work with the CCM team to address needs related to Chronic disease management for Multiple Sclerosis  Interventions:  . Collaborated with BSW and initiated plan of care to address needs related to Chronic Care Management and Care Coordination needs  Patient Self Care Activities:  . Self administers medications as prescribed . Attends all scheduled provider appointments . Calls pharmacy for medication refills . Calls provider office for new concerns or questions  Initial goal documentation        Patient verbalizes understanding of instructions provided today.   Telephone follow up appointment with care management team member scheduled for: 12/21/19  Barb Merino, RN, BSN, CCM Care Management Coordinator [facility] [facility]  Direct Phone: [phone]

## 2019-11-10 NOTE — Chronic Care Management (AMB) (Signed)
Care Management   Initial Visit Note  11/09/2019 Name: [name] MRN: [id number] DOB: Mar 30, 1966  Referred by: [name], FNP Reason for referral : Care Coordination (RQ Initial Call - MS)   Teri A Mabray is a 54 y.o. year old female who is a primary care patient of [name], [place]. The CCM team was consulted for assistance with chronic disease management and care coordination needs related to MS, Gait disturbance, Foot drop, Constipation   Review of patient status, including review of consultants reports, relevant laboratory and other test results, and collaboration with appropriate care team members and the patient's provider was performed as part of comprehensive patient evaluation and provision of chronic care management services.    SDOH (Social Determinants of Health) assessments performed: No See Care Plan activities for detailed interventions related to [place])   Initial CCM RN CM telephone outreach completed with patient to assess for CCM RN CM needs and a care plan was established.     Medications: Outpatient Encounter Medications as of 11/08/2019  Medication Sig  . CVS D3 50 MCG (2000 UT) CAPS TAKE 1 CAPSULE BY MOUTH DAILY OR AS DIRECTED  . Multiple Vitamin (MULTIVITAMIN ADULT PO) Take 1 Dose by mouth daily.  [name] OVER THE COUNTER MEDICATION Take 1 Dose by mouth daily. [place]-  Immune supplement  . TECFIDERA 240 MG CPDR TAKE 1 CAPSULE (240 MG) BY MOUTH TWICE DAILY  . vitamin B-12 (CVS B-12) 500 MCG tablet Take 2 tablets (1,000 mcg total) by mouth daily.   No facility-administered encounter medications on file as of 11/08/2019.     Objective:  No results found for: HGBA1C Lab Results  Component Value Date   CREATININE 0.76 06/15/2019   BP Readings from Last 3 Encounters:  06/13/19 112/74  11/19/18 132/80  10/30/18 120/71    Goals Addressed      Patient Stated   . "slow down the progression of my MS" (pt-stated)       CARE PLAN ENTRY (see  longtitudinal plan of care for additional care plan information)  Current Barriers:  [name] Knowledge Deficits related to disease process and Self Health Management of MS  . Chronic Disease Management support and education needs related to Secondary Chronic Progressive MS, Gait disturbance, High Risk Medication, Constipation   Nurse Case Manager Clinical Goal(s):  [name] Over the next 90 days, patient will work with the [place] CM to address needs related to education and support to improve Self management of MS . Over the next 90 days, patient will have increased knowledge and understanding about alternate DME options to consider to help improve her gait/mobility, such as forearm crutches  CCM RN CM Interventions:  11/09/19 call completed with patient  . Evaluation of current treatment plan related to Multiple Sclerosis and patient's adherence to plan as established by provider . Determined patient developed symptoms of MS about 25 years ago but only started treatment with Tecfidera 5-7 years ago . Determined patient currently uses a walker for ambulation due to having balance/gait issues secondary to the MS . Educated patient about alternative DME to assist with ambulation such as forearm crutches  . Determined patient is established with Neurology to help manage her MS . Provided education to patient re: MS disease process, types of MS and recommendations for treatment management and MD follow up . Reviewed medications with patient and discussed indication, frequency and dosage of prescribed Tecfidera; patient is taking 240 mg bid without noted SE; reports no  recent exacerbations  . Discussed plans with patient for ongoing care management follow up and provided patient with direct contact information for care management team . Provided patient with printed educational materials related to Forearm crutches: Finding Balance with MS   Patient Self Care Activities:  . Self administers medications as  prescribed . Attends all scheduled provider appointments . Calls pharmacy for medication refills . Performs ADL's independently . Performs IADL's independently . Calls provider office for new concerns or questions  Initial goal documentation     . "to better manage my constipation" (pt-stated)       CARE PLAN ENTRY (see longtitudinal plan of care for additional care plan information)  Current Barriers:  [name] Knowledge Deficits related to disease process and Self Health management of Multiple Sclerosis . Chronic Disease Management support and education needs related to Secondary Chronic Progressive MS, Gait disturbance, Constipation, High Risk Medication   Nurse Case Manager Clinical Goal(s):  [name] Over the next 90 days, patient will work with CCM RN CM and PCP  to address needs related to Constipation . Over the next 90 days, patient will have increased knowledge and understanding about the disease process, evaluation and treatment of Neurogenic Bowel disease secondary to [place] RN CM Interventions:  11/10/19 call completed with patient  . Evaluation of current treatment plan related to MS and patient's adherence to plan as established by provider. . Provided education to patient re: risk for neurogenic bowel secondary to disease progression related to MS . Reviewed medications with patient and discussed patient plans to use Metamucil OTC, she will try Miralax if the Metamucil is not effective . Discussed plans with patient for ongoing care management follow up and provided patient with direct contact information for care management team . Provided patient with printed educational materials related to Bowel and Bladder Dysfunction in Multiple Sclerosis; 6 tips to Being Water Wise  Patient Self Care Activities:  . Self administers medications as prescribed . Attends all scheduled provider appointments . Calls pharmacy for medication refills . Performs ADL's independently . Performs  IADL's independently . Calls provider office for new concerns or questions  Initial goal documentation       Other   . COMPLETED: Assist with Chronic Care Management and Care Coordination needs       Current Barriers:  [name] Knowledge Barriers related to resources and support available to address needs related to Chronic disease management and Care Coordination needs   Case Manager Clinical Goal(s):  [name] Over the next 30 days, patient will work with the CCM team to address needs related to Chronic disease management for Multiple Sclerosis  Interventions:  . Collaborated with BSW and initiated plan of care to address needs related to Chronic Care Management and Care Coordination needs  Patient Self Care Activities:  . Self administers medications as prescribed . Attends all scheduled provider appointments . Calls pharmacy for medication refills . Calls provider office for new concerns or questions  Initial goal documentation       Plan:   Telephone follow up appointment with care management team member scheduled for:12/21/19  Barb Merino, RN, BSN, CCM Care Management Coordinator [place] [facility]  Direct Phone: [phone]

## 2019-11-13 ENCOUNTER — Other Ambulatory Visit: Payer: Self-pay | Admitting: Neurology

## 2019-12-21 ENCOUNTER — Telehealth: Payer: Self-pay

## 2020-01-03 ENCOUNTER — Telehealth: Payer: Self-pay

## 2020-01-03 NOTE — Telephone Encounter (Signed)
PT LVM REQ APPT ATT TO CONTACT PT TO SCHEDULE NO ANS LVM TO CALL OFC

## 2020-01-09 ENCOUNTER — Ambulatory Visit: Payer: Self-pay

## 2020-01-09 ENCOUNTER — Other Ambulatory Visit: Payer: Self-pay

## 2020-01-09 ENCOUNTER — Telehealth: Payer: Self-pay

## 2020-01-09 DIAGNOSIS — R269 Unspecified abnormalities of gait and mobility: Secondary | ICD-10-CM

## 2020-01-09 DIAGNOSIS — G35 Multiple sclerosis: Secondary | ICD-10-CM

## 2020-01-09 DIAGNOSIS — K5900 Constipation, unspecified: Secondary | ICD-10-CM

## 2020-01-10 ENCOUNTER — Telehealth: Payer: Self-pay | Admitting: Neurology

## 2020-01-10 DIAGNOSIS — R269 Unspecified abnormalities of gait and mobility: Secondary | ICD-10-CM

## 2020-01-10 DIAGNOSIS — M6281 Muscle weakness (generalized): Secondary | ICD-10-CM

## 2020-01-10 DIAGNOSIS — G35 Multiple sclerosis: Secondary | ICD-10-CM

## 2020-01-10 NOTE — Telephone Encounter (Signed)
Called, LVM returning call

## 2020-01-10 NOTE — Telephone Encounter (Signed)
Angel Little([employer]ight forwarder for [employer]) has called to discuss a referral for PT.

## 2020-01-11 NOTE — Telephone Encounter (Signed)
Called [name] back. She is requesting referral be placed for in home PT. She is home bound, does not have transportation and would like in home therapy. She ordered [place] crutches to use versus walker. When she attempted to use them, unsure if she is using them properly, having muscle weakness. Feels she could benefit having PT come out for evaluation/balance and strengthening. Advised I will speak with [name]. He should be fine with this and we will place referral. Pt did not have preference of who to be referred to. She will f/u with pt to let her know we will place referral. Nothing further needed.  ( I spoke with [name] and received VO to place referral for home PT- I placed referall).

## 2020-01-11 NOTE — Patient Instructions (Signed)
Visit Information  Goals Addressed      Patient Stated   . "slow down the progression of my MS" (pt-stated)       CARE PLAN ENTRY (see longtitudinal plan of care for additional care plan information)  Current Barriers:  [name] Knowledge Deficits related to disease process and Self Health Management of MS  . Chronic Disease Management support and education needs related to Secondary Chronic Progressive MS, Gait disturbance, High Risk Medication, Constipation   Nurse Case Manager Clinical Goal(s):  [name] Over the next 90 days, patient will work with the [place] CM to address needs related to education and support to improve Self management of MS . Over the next 90 days, patient will have increased knowledge and understanding about alternate DME options to consider to help improve her gait/mobility, such as forearm crutches  CCM RN CM Interventions:  01/10/20 call completed with patient  . Evaluation of current treatment plan related to Multiple Sclerosis and patient's adherence to plan as established by provider . Discussed patient completed a video visit with Dr. Felecia Shelling to evaluate MS  . Determined patient continues to take Tecfidera w/o noted SE, she denies having recent MS exacerbation  . Discussed patient received and reviewed the printed ed mats providing information about use of [place] crutches however, she has not felt strong enough to be able to use them, she was considering returning them . Discussed and determined patient may benefit from having in home PT for a HSE and to assess for proper use/fit of the [place] crutches; Discussed patient may also benefit from having PT work with her on strengthening and balance, pt agrees . Placed outbound call to Dr. Garth Bigness office, spoke with nurse Terrence Dupont who will ask Dr. Felecia Shelling to place a [facility] referral for PT . Attempted outbound call to patient to make aware, but was unable to reach her . Discussed plans with patient for ongoing care management follow up  and provided patient with direct contact information for care management team  Patient Self Care Activities:  . Self administers medications as prescribed . Attends all scheduled provider appointments . Calls pharmacy for medication refills . Performs ADL's independently . Performs IADL's independently . Calls provider office for new concerns or questions  Please see past updates related to this goal by clicking on the "Past Updates" button in the selected goal        Patient verbalizes understanding of instructions provided today.   Telephone follow up appointment with care management team member scheduled for: 01/19/20  Barb Merino, RN, BSN, CCM Care Management Coordinator [place] [facility]  Direct Phone: [phone]

## 2020-01-11 NOTE — Telephone Encounter (Signed)
Angel returned call. Please call back when available. [phone]

## 2020-01-11 NOTE — Addendum Note (Signed)
Addended by: Wyvonnia Lora on: 01/11/2020 10:18 AM   Modules accepted: Orders

## 2020-01-11 NOTE — Chronic Care Management (AMB) (Addendum)
Care Management   Follow Up Note   01/10/2020 Name: [name] MRN: [id number] DOB: [birth year]  Referred by: [name], FNP Reason for referral : Care Coordination (FU Call - MS, gait disturbance)   [name] is a 54 y.o. year old female who is a primary care patient of [name], [place]. The CCM team was consulted for assistance with chronic disease management and care coordination needs.    Review of patient status, including review of consultants reports, relevant laboratory and other test results, and collaboration with appropriate care team members and the patient's provider was performed as part of comprehensive patient evaluation and provision of chronic care management services.    SDOH (Social Determinants of Health) assessments performed: Yes - no acute needs at this time  See Care Plan activities for detailed interventions related to [place])  Placed outbound CCM RN CM follow up call to patient for update on her MS.      Outpatient Encounter Medications as of 01/09/2020  Medication Sig  . CVS B12 QUICK DISSOLVE 500 MCG LOZG TAKE 2 TABS BY MOUTH DAILY  . CVS D3 50 MCG (2000 UT) CAPS TAKE 1 CAPSULE BY MOUTH DAILY OR AS DIRECTED  . Multiple Vitamin (MULTIVITAMIN ADULT PO) Take 1 Dose by mouth daily.  [name] OVER THE COUNTER MEDICATION Take 1 Dose by mouth daily. [place]-  Immune supplement  . TECFIDERA 240 MG CPDR TAKE 1 CAPSULE (240 MG) BY MOUTH TWICE DAILY   No facility-administered encounter medications on file as of 01/09/2020.     Objective:  No results found for: HGBA1C Lab Results  Component Value Date   CREATININE 0.76 06/15/2019   BP Readings from Last 3 Encounters:  06/13/19 112/74  11/19/18 132/80  10/30/18 120/71    Goals Addressed      Patient Stated   . "slow down the progression of my MS" (pt-stated)       CARE PLAN ENTRY (see longtitudinal plan of care for additional care plan information)  Current Barriers:  [name] Knowledge Deficits  related to disease process and Self Health Management of MS  . Chronic Disease Management support and education needs related to Secondary Chronic Progressive MS, Gait disturbance, High Risk Medication, Constipation   Nurse Case Manager Clinical Goal(s):  [name] Over the next 90 days, patient will work with the [place] CM to address needs related to education and support to improve Self management of MS . Over the next 90 days, patient will have increased knowledge and understanding about alternate DME options to consider to help improve her gait/mobility, such as forearm crutches  CCM RN CM Interventions:  01/10/20 call completed with patient  . Evaluation of current treatment plan related to Multiple Sclerosis and patient's adherence to plan as established by provider . Discussed patient completed a video visit with Dr. Felecia Shelling to evaluate MS  . Determined patient continues to take Tecfidera w/o noted SE, she denies having recent MS exacerbation  . Discussed patient received and reviewed the printed ed mats providing information about use of [place] crutches however, she has not felt strong enough to be able to use them, she was considering returning them . Discussed and determined patient may benefit from having in home PT for a HSE and to assess for proper use/fit of the [place] crutches; Discussed patient may also benefit from having PT work with her on strengthening and balance, pt agrees . Placed outbound call to Dr. Garth Bigness office, spoke with nurse Terrence Dupont  who will ask Dr. Felecia Shelling to place a [facility] referral for PT . Attempted outbound call to patient to make aware, but was unable to reach her . Discussed plans with patient for ongoing care management follow up and provided patient with direct contact information for care management team  Patient Self Care Activities:  . Self administers medications as prescribed . Attends all scheduled provider appointments . Calls pharmacy for medication refills .  Performs ADL's independently . Performs IADL's independently . Calls provider office for new concerns or questions  Please see past updates related to this goal by clicking on the "Past Updates" button in the selected goal        Plan:   Telephone follow up appointment with care management team member scheduled for: 01/19/20 Follow up with provider re: referral for in home PT  Barb Merino, RN, BSN, CCM Care Management Coordinator [place] [facility]  Direct Phone: [phone]

## 2020-01-19 ENCOUNTER — Telehealth: Payer: Self-pay

## 2020-01-19 ENCOUNTER — Other Ambulatory Visit: Payer: Self-pay

## 2020-01-19 ENCOUNTER — Ambulatory Visit: Payer: Self-pay

## 2020-01-20 NOTE — Chronic Care Management (AMB) (Signed)
  Care Management   Outreach Note  01/20/2020 Name: [name] MRN: [id number] DOB: [birth year]  Referred by: [name], FNP Reason for referral : Chronic Care Management (FU RN Call - [facility] PT)   An unsuccessful telephone outreach was attempted today. The patient was referred to the case management team for assistance with care management and care coordination.   Follow Up Plan: A HIPPA compliant phone message was left for the patient providing contact information and requesting a return call.  Telephone follow up appointment with care management team member scheduled for: 02/20/20  Barb Merino, RN, BSN, CCM Care Management Coordinator [place] [facility]  Direct Phone: [phone]

## 2020-01-30 ENCOUNTER — Ambulatory Visit: Payer: Self-pay

## 2020-01-30 DIAGNOSIS — G35 Multiple sclerosis: Secondary | ICD-10-CM

## 2020-01-30 NOTE — Telephone Encounter (Signed)
Phone rep checked office voicemail's, at 11:52 Social Worker Kendra from [facility] left message stating pt informed her she has not been contacted re: physical therapy.  [name] stated she called [facility] and was told they do not have pt's information, no record of pt.  [name] states [facility] fax# is [phone].  [name] can be called at [phone]

## 2020-01-30 NOTE — Patient Instructions (Signed)
Visit Information  Goals Addressed            This Visit's Progress     Patient Stated   . "I need to get a COVID vaccine but struggle with transportation" (pt-stated)       [place] (see longitudinal plan of care for additional care plan information)  Current Barriers:  . Limited access to caregiver whom provides transportation . Limited knowledge of local pharmacy offering in home vaccinations to qualifying Medicare recipients .  Limited ability to perform iADL's independently due to progression of MS  Social Work Clinical Goal(s):  [name] Over the next 5 days the patient will receive first dose of COVID 19 vaccine . Over the next 30 days the patient will receive second dose of COVID 19 vaccine  CCM SW Interventions: Completed 01/30/20 . Inter-disciplinary care team collaboration (see longitudinal plan of care) . Successful outbound call placed to the patient to assist with care coordination needs . Determined the patient is interested in receiving a COVID 19 vaccine but has limited access to transportation due to her daughters work schedule . Provided education to the patient regarding the opportunity to have a COVID 19 vaccine in the home offered by [employer] . Obtained verbal permission from the patient to contact [facility] on behalf of the patient to schedule a vaccination appointment . Collaboration with Oley Balm at [employer], successful referral for in home vaccine . Outbound call placed to the patient to confirm plan for a pharmacist to visit her home on the afternoon of Thursday May 20th to administer vaccine . Scheduled follow up call over the next week to assess goal progression and plan for second vaccine  Patient Self Care Activities:  . Patient verbalizes understanding of plan to received COVID 19 vaccine in the home from [facility] . Self administers medications as prescribed . Attends all scheduled provider appointments . Calls provider  office for new concerns or questions . Unable to perform IADLs independently  Initial goal documentation;    . "slow down the progression of my MS" (pt-stated)       CARE PLAN ENTRY (see longtitudinal plan of care for additional care plan information)  Current Barriers:  [name] Knowledge Deficits related to disease process and Self Health Management of MS  . Chronic Disease Management support and education needs related to Secondary Chronic Progressive MS, Gait disturbance, High Risk Medication, Constipation   Nurse Case Manager Clinical Goal(s):  [name] Over the next 90 days, patient will work with the [place] CM to address needs related to education and support to improve Self management of MS . Over the next 90 days, patient will have increased knowledge and understanding about alternate DME options to consider to help improve her gait/mobility, such as forearm crutches  CCM SW Interventions: Completed 01/30/20  . Successful outbound call placed to the patient to assist with care coordination needs . Determined the patient has yet to receive in home PT . Performed chart review to note referral placed to [facility] on 01/11/20 by Dr. Felecia Shelling . Advised the patient SW would outreach home health agency to request update on the status of patient referral . Collaboration with [facility] whom reports no record of patient referral in the system . Outbound call placed to [facility], voice message left requesting orders be re-sent to [place] in order to initiate home health services . Outbound call to the patient to advise of plan to have  Dr. Felecia Shelling send orders to [facility] for therapy services . Advised the patient SW will follow up over the next week to assess goal progression . Collaboration with Barb Merino, [place] regarding goal progression  CCM RN CM Interventions:  01/10/20 call completed with patient  . Evaluation of current treatment plan related to  Multiple Sclerosis and patient's adherence to plan as established by provider . Discussed patient completed a video visit with Dr. Felecia Shelling to evaluate MS  . Determined patient continues to take Tecfidera w/o noted SE, she denies having recent MS exacerbation  . Discussed patient received and reviewed the printed ed mats providing information about use of [place] crutches however, she has not felt strong enough to be able to use them, she was considering returning them . Discussed and determined patient may benefit from having in home PT for a HSE and to assess for proper use/fit of the [place] crutches; Discussed patient may also benefit from having PT work with her on strengthening and balance, pt agrees . Placed outbound call to Dr. Garth Bigness office, spoke with nurse Terrence Dupont who will ask Dr. Felecia Shelling to place a [facility] referral for PT . Attempted outbound call to patient to make aware, but was unable to reach her . Discussed plans with patient for ongoing care management follow up and provided patient with direct contact information for care management team  Patient Self Care Activities:  . Self administers medications as prescribed . Attends all scheduled provider appointments . Calls pharmacy for medication refills . Performs ADL's independently . Performs IADL's independently . Calls provider office for new concerns or questions  Please see past updates related to this goal by clicking on the "Past Updates" button in the selected goal         The patient verbalized understanding of instructions provided today and declined a print copy of patient instruction materials.   The care management team will reach out to the patient again over the next 10 days.   Daneen Schick, BSW, CDP Social Worker, Certified Dementia Practitioner [place] / [place] [facility] [phone]

## 2020-01-30 NOTE — Chronic Care Management (AMB) (Signed)
Care Management    Social Work Follow Up Note  01/30/2020 Name: [name] MRN: [id number] DOB: 08-29-1966  [name] is a 54 y.o. year old female who is a primary care patient of [name], [place]. The care management team was consulted for assistance with care coordination.   Review of patient status, including review of consultants reports, other relevant assessments, and collaboration with appropriate care team members and the patient's provider was performed as part of comprehensive patient evaluation and provision of chronic care management services.    SDOH (Social Determinants of Health) assessments performed: No    Outpatient Encounter Medications as of 01/30/2020  Medication Sig  . CVS B12 QUICK DISSOLVE 500 MCG LOZG TAKE 2 TABS BY MOUTH DAILY  . CVS D3 50 MCG (2000 UT) CAPS TAKE 1 CAPSULE BY MOUTH DAILY OR AS DIRECTED  . Multiple Vitamin (MULTIVITAMIN ADULT PO) Take 1 Dose by mouth daily.  [name] OVER THE COUNTER MEDICATION Take 1 Dose by mouth daily. [place]-  Immune supplement  . TECFIDERA 240 MG CPDR TAKE 1 CAPSULE (240 MG) BY MOUTH TWICE DAILY   No facility-administered encounter medications on file as of 01/30/2020.     Goals Addressed            This Visit's Progress     Patient Stated   . "I need to get a COVID vaccine but struggle with transportation" (pt-stated)       [place] (see longitudinal plan of care for additional care plan information)  Current Barriers:  . Limited access to caregiver whom provides transportation . Limited knowledge of local pharmacy offering in home vaccinations to qualifying Medicare recipients .  Limited ability to perform iADL's independently due to progression of MS  Social Work Clinical Goal(s):  [name] Over the next 5 days the patient will receive first dose of COVID 19 vaccine . Over the next 30 days the patient will receive second dose of COVID 19 vaccine  CCM SW Interventions: Completed  01/30/20 . Inter-disciplinary care team collaboration (see longitudinal plan of care) . Successful outbound call placed to the patient to assist with care coordination needs . Determined the patient is interested in receiving a COVID 19 vaccine but has limited access to transportation due to her daughters work schedule . Provided education to the patient regarding the opportunity to have a COVID 19 vaccine in the home offered by [employer] . Obtained verbal permission from the patient to contact [facility] on behalf of the patient to schedule a vaccination appointment . Collaboration with Oley Balm at [employer], successful referral for in home vaccine . Outbound call placed to the patient to confirm plan for a pharmacist to visit her home on the afternoon of Thursday May 20th to administer vaccine . Scheduled follow up call over the next week to assess goal progression and plan for second vaccine  Patient Self Care Activities:  . Patient verbalizes understanding of plan to received COVID 19 vaccine in the home from [facility] . Self administers medications as prescribed . Attends all scheduled provider appointments . Calls provider office for new concerns or questions . Unable to perform IADLs independently  Initial goal documentation;    . "slow down the progression of my MS" (pt-stated)       CARE PLAN ENTRY (see longtitudinal plan of care for additional care plan information)  Current Barriers:  [name] Knowledge Deficits related to disease process and Self Health Management of MS  .  Chronic Disease Management support and education needs related to Secondary Chronic Progressive MS, Gait disturbance, High Risk Medication, Constipation   Nurse Case Manager Clinical Goal(s):  [name] Over the next 90 days, patient will work with the [place] CM to address needs related to education and support to improve Self management of MS . Over the next 90 days, patient will have increased  knowledge and understanding about alternate DME options to consider to help improve her gait/mobility, such as forearm crutches  CCM SW Interventions: Completed 01/30/20  . Successful outbound call placed to the patient to assist with care coordination needs . Determined the patient has yet to receive in home PT . Performed chart review to note referral placed to [facility] on 01/11/20 by Dr. Felecia Shelling . Advised the patient SW would outreach home health agency to request update on the status of patient referral . Collaboration with [place] whom reports no record of patient referral in the system . Outbound call placed to [facility], voice message left requesting orders be re-sent to [place] in order to initiate home health services . Outbound call to the patient to advise of plan to have Dr. Felecia Shelling send orders to [place] for therapy services . Advised the patient SW will follow up over the next week to assess goal progression . Collaboration with Barb Merino, [place] regarding goal progression  CCM RN CM Interventions:  01/10/20 call completed with patient  . Evaluation of current treatment plan related to Multiple Sclerosis and patient's adherence to plan as established by provider . Discussed patient completed a video visit with Dr. Felecia Shelling to evaluate MS  . Determined patient continues to take Tecfidera w/o noted SE, she denies having recent MS exacerbation  . Discussed patient received and reviewed the printed ed mats providing information about use of Canadian crutches however, she has not felt strong enough to be able to use them, she was considering returning them . Discussed and determined patient may benefit from having in home PT for a HSE and to assess for proper use/fit of the Canadian crutches; Discussed patient may also benefit from having PT work with her on strengthening and balance, pt agrees . Placed outbound call to Dr. Garth Bigness office,  spoke with nurse Terrence Dupont who will ask Dr. Felecia Shelling to place a [facility] referral for PT . Attempted outbound call to patient to make aware, but was unable to reach her . Discussed plans with patient for ongoing care management follow up and provided patient with direct contact information for care management team  Patient Self Care Activities:  . Self administers medications as prescribed . Attends all scheduled provider appointments . Calls pharmacy for medication refills . Performs ADL's independently . Performs IADL's independently . Calls provider office for new concerns or questions  Please see past updates related to this goal by clicking on the "Past Updates" button in the selected goal          Follow Up Plan: SW will follow up with patient by phone over the next 10 days.   Daneen Schick, BSW, CDP Social Worker, Certified Dementia Practitioner [place] [facility] [place] [facility] [phone]  Total time spent performing care coordination and/or care management activities with the patient by phone or face to face = 42 minutes.

## 2020-01-30 NOTE — Telephone Encounter (Signed)
Raquel Sarna- can you f/u on this? Looks like [facility] D[facility] did send it to [facility]. Thank you!

## 2020-01-31 ENCOUNTER — Ambulatory Visit: Payer: Self-pay

## 2020-01-31 DIAGNOSIS — G35 Multiple sclerosis: Secondary | ICD-10-CM

## 2020-01-31 NOTE — Chronic Care Management (AMB) (Signed)
Chronic Care Management    Social Work Follow Up Note  01/31/2020 Name: Christina LOISELLE MRN: [id number] DOB: 28-Apr-1966  [name] is a 54 y.o. year old female who is a primary care patient of [name], [place]. The CCM team was consulted for assistance with care coordination.   Review of patient status, including review of consultants reports, other relevant assessments, and collaboration with appropriate care team members and the patient's provider was performed as part of comprehensive patient evaluation and provision of chronic care management services.    SDOH (Social Determinants of Health) assessments performed: No    Outpatient Encounter Medications as of 01/31/2020  Medication Sig  . CVS B12 QUICK DISSOLVE 500 MCG LOZG TAKE 2 TABS BY MOUTH DAILY  . CVS D3 50 MCG (2000 UT) CAPS TAKE 1 CAPSULE BY MOUTH DAILY OR AS DIRECTED  . Multiple Vitamin (MULTIVITAMIN ADULT PO) Take 1 Dose by mouth daily.  [name] OVER THE COUNTER MEDICATION Take 1 Dose by mouth daily. [place]-  Immune supplement  . TECFIDERA 240 MG CPDR TAKE 1 CAPSULE (240 MG) BY MOUTH TWICE DAILY   No facility-administered encounter medications on file as of 01/31/2020.     Goals Addressed            This Visit's Progress     Patient Stated   . "slow down the progression of my MS" (pt-stated)       CARE PLAN ENTRY (see longtitudinal plan of care for additional care plan information)  Current Barriers:  [name] Knowledge Deficits related to disease process and Self Health Management of MS  . Chronic Disease Management support and education needs related to Secondary Chronic Progressive MS, Gait disturbance, High Risk Medication, Constipation   Nurse Case Manager Clinical Goal(s):  [name] Over the next 90 days, patient will work with the [place] CM to address needs related to education and support to improve Self management of MS . Over the next 90 days, patient will have increased knowledge and understanding about  alternate DME options to consider to help improve her gait/mobility, such as forearm crutches  CCM SW Interventions: Completed 01/31/20 . Received inbound call from [place] with [facility] who reports PT orders sent to [place] denied due to payor source . Confirmed new referral being sent to [place] . Discussed Hinton Dyer plans to contact the patient to inform of change to home health providers . SW to follow up over the next two weeks to assess goal progression  Completed 01/30/20  . Successful outbound call placed to the patient to assist with care coordination needs . Determined the patient has yet to receive in home PT . Performed chart review to note referral placed to [facility] on 01/11/20 by Dr. Felecia Shelling . Advised the patient SW would outreach home health agency to request update on the status of patient referral . Collaboration with [place] whom reports no record of patient referral in the system . Outbound call placed to [facility], voice message left requesting orders be re-sent to [place] in order to initiate home health services . Outbound call to the patient to advise of plan to have Dr. Felecia Shelling send orders to [place] for therapy services . Advised the patient SW will follow up over the next week to assess goal progression . Collaboration with Barb Merino, [place] regarding goal progression  CCM RN CM Interventions:  01/10/20 call completed with patient  .  Evaluation of current treatment plan related to Multiple Sclerosis and patient's adherence to plan as established by provider . Discussed patient completed a video visit with Dr. Felecia Shelling to evaluate MS  . Determined patient continues to take Tecfidera w/o noted SE, she denies having recent MS exacerbation  . Discussed patient received and reviewed the printed ed mats providing information about use of Canadian crutches however, she has not felt strong enough to be able to  use them, she was considering returning them . Discussed and determined patient may benefit from having in home PT for a HSE and to assess for proper use/fit of the Canadian crutches; Discussed patient may also benefit from having PT work with her on strengthening and balance, pt agrees . Placed outbound call to Dr. Garth Bigness office, spoke with nurse Terrence Dupont who will ask Dr. Felecia Shelling to place a [facility] referral for PT . Attempted outbound call to patient to make aware, but was unable to reach her . Discussed plans with patient for ongoing care management follow up and provided patient with direct contact information for care management team  Patient Self Care Activities:  . Self administers medications as prescribed . Attends all scheduled provider appointments . Calls pharmacy for medication refills . Performs ADL's independently . Performs IADL's independently . Calls provider office for new concerns or questions  Please see past updates related to this goal by clicking on the "Past Updates" button in the selected goal          Follow Up Plan: SW will follow up with patient by phone over the next two weeks.   Daneen Schick, BSW, CDP Social Worker, Certified Dementia Practitioner [place] [facility] [place] [facility] [phone]  Total time spent performing care coordination and/or care management activities with the patient by phone or face to face = 8 minutes.

## 2020-01-31 NOTE — Patient Instructions (Signed)
Social Worker Visit Information  Goals we discussed today:  Goals Addressed            This Visit's Progress     Patient Stated   . "slow down the progression of my MS" (pt-stated)       CARE PLAN ENTRY (see longtitudinal plan of care for additional care plan information)  Current Barriers:  [name] Knowledge Deficits related to disease process and Self Health Management of MS  . Chronic Disease Management support and education needs related to Secondary Chronic Progressive MS, Gait disturbance, High Risk Medication, Constipation   Nurse Case Manager Clinical Goal(s):  [name] Over the next 90 days, patient will work with the [place] CM to address needs related to education and support to improve Self management of MS . Over the next 90 days, patient will have increased knowledge and understanding about alternate DME options to consider to help improve her gait/mobility, such as forearm crutches  CCM SW Interventions: Completed 01/31/20 . Received inbound call from [place] with [facility] who reports PT orders sent to [place] denied due to payor source . Confirmed new referral being sent to [place] . Discussed [place] D[place] plans to contact the patient to inform of change to home health providers . SW to follow up over the next two weeks to assess goal progression  Completed 01/30/20  . Successful outbound call placed to the patient to assist with care coordination needs . Determined the patient has yet to receive in home PT . Performed chart review to note referral placed to [facility] on 01/11/20 by Dr. Felecia Shelling . Advised the patient SW would outreach home health agency to request update on the status of patient referral . Collaboration with [place] whom reports no record of patient referral in the system . Outbound call placed to [facility], voice message left requesting orders be re-sent to [place] in order to initiate home health  services . Outbound call to the patient to advise of plan to have Dr. Felecia Shelling send orders to [place] for therapy services . Advised the patient SW will follow up over the next week to assess goal progression . Collaboration with Barb Merino, [place] regarding goal progression  CCM RN CM Interventions:  01/10/20 call completed with patient  . Evaluation of current treatment plan related to Multiple Sclerosis and patient's adherence to plan as established by provider . Discussed patient completed a video visit with Dr. Felecia Shelling to evaluate MS  . Determined patient continues to take Tecfidera w/o noted SE, she denies having recent MS exacerbation  . Discussed patient received and reviewed the printed ed mats providing information about use of Canadian crutches however, she has not felt strong enough to be able to use them, she was considering returning them . Discussed and determined patient may benefit from having in home PT for a HSE and to assess for proper use/fit of the Canadian crutches; Discussed patient may also benefit from having PT work with her on strengthening and balance, pt agrees . Placed outbound call to Dr. Garth Bigness office, spoke with nurse Terrence Dupont who will ask Dr. Felecia Shelling to place a [facility] referral for PT . Attempted outbound call to patient to make aware, but was unable to reach her . Discussed plans with patient for ongoing care management follow up and provided patient with direct contact information for care management team  Patient Self Care Activities:  . Self administers medications  as prescribed . Attends all scheduled provider appointments . Calls pharmacy for medication refills . Performs ADL's independently . Performs IADL's independently . Calls provider office for new concerns or questions  Please see past updates related to this goal by clicking on the "Past Updates" button in the selected goal          Follow Up Plan: SW will follow up with patient by  phone over the next two weeks.   Daneen Schick, BSW, CDP Social Worker, Certified Dementia Practitioner [place] [facility] [place] [facility] [phone]

## 2020-02-01 ENCOUNTER — Other Ambulatory Visit: Payer: Self-pay | Admitting: Neurology

## 2020-02-01 DIAGNOSIS — G35 Multiple sclerosis: Secondary | ICD-10-CM

## 2020-02-01 NOTE — Telephone Encounter (Signed)
[facility] came back and relayed they could not [facility] . I have talked to Education officer, museum and patient and they are aware I am working on trying to find her home health who has staffing .

## 2020-02-01 NOTE — Telephone Encounter (Signed)
There's no well performed studies for this patch so no evidence it will make much of a difference

## 2020-02-01 NOTE — Telephone Encounter (Signed)
Called pt and relayed Dr. Garth Bigness message. Pt verbalized understanding.

## 2020-02-01 NOTE — Telephone Encounter (Signed)
Patient is wanting to know if Dr. Felecia Shelling can look into the TAO PATCH its on Internet . Patient is thinking about trying it ? Patient want's Dr. Garth Bigness ok

## 2020-02-06 ENCOUNTER — Ambulatory Visit: Payer: Self-pay

## 2020-02-06 ENCOUNTER — Telehealth: Payer: Self-pay | Admitting: Neurology

## 2020-02-06 DIAGNOSIS — G35 Multiple sclerosis: Secondary | ICD-10-CM

## 2020-02-06 NOTE — Telephone Encounter (Signed)
Patient needs

## 2020-02-06 NOTE — Chronic Care Management (AMB) (Signed)
Care Management    Social Work Follow Up Note  02/06/2020 Name: CALIYAH SIEH MRN: [id number] DOB: 28-Nov-1965  [name] Shands is a 54 y.o. year old female who is a primary care patient of [name], [place]. The CCM team was consulted for assistance with care coordination.   Review of patient status, including review of consultants reports, other relevant assessments, and collaboration with appropriate care team members and the patient's provider was performed as part of comprehensive patient evaluation and provision of chronic care management services.    SDOH (Social Determinants of Health) assessments performed: No    Outpatient Encounter Medications as of 02/06/2020  Medication Sig  . CVS B12 QUICK DISSOLVE 500 MCG LOZG TAKE 2 TABS BY MOUTH DAILY  . CVS D3 50 MCG (2000 UT) CAPS TAKE 1 CAPSULE BY MOUTH DAILY OR AS DIRECTED  . Multiple Vitamin (MULTIVITAMIN ADULT PO) Take 1 Dose by mouth daily.  [name] OVER THE COUNTER MEDICATION Take 1 Dose by mouth daily. [place]-  Immune supplement  . TECFIDERA 240 MG CPDR Take 1 capsule by mouth twice daily   No facility-administered encounter medications on file as of 02/06/2020.     Goals Addressed            This Visit's Progress     Patient Stated   . "I need to get a COVID vaccine but struggle with transportation" (pt-stated)   On track    [place] (see longitudinal plan of care for additional care plan information)  Current Barriers:  . Limited access to caregiver whom provides transportation . Limited knowledge of local pharmacy offering in home vaccinations to qualifying Medicare recipients .  Limited ability to perform iADL's independently due to progression of MS  Social Work Clinical Goal(s):  [name] Over the next 5 days the patient will receive first dose of COVID 19 vaccine  Goal Met . Over the next 30 days the patient will receive second dose of COVID 19 vaccine  CCM SW Interventions: Completed  02/06/20 . Inter-disciplinary care team collaboration (see longitudinal plan of care) . Successful outbound call placed to the patient to assess goal progression . Confirmed the patient received first dose of COVID 19 vaccine on May 20th in home via [employer] o The patient reports she was given a Pfizer vaccine in her right arm o The patient notes arm soreness to the injection site o Next planned vaccine scheduled for June 10 . Encouraged the patient to contact SW as needed . Scheduled outbound call over the next month to confirm goal met  Patient Self Care Activities:  . Patient verbalizes understanding of plan to received COVID 19 vaccine in the home from [facility] . Self administers medications as prescribed . Attends all scheduled provider appointments . Calls provider office for new concerns or questions . Unable to perform IADLs independently  Please see past updates related to this goal by clicking on the "Past Updates" button in the selected goal ;    . "slow down the progression of my MS" (pt-stated)   Not on track    [place] (see longtitudinal plan of care for additional care plan information)  Current Barriers:  [name] Knowledge Deficits related to disease process and Self Health Management of MS  . Chronic Disease Management support and education needs related to Secondary Chronic Progressive MS, Gait disturbance, High Risk Medication, Constipation   Nurse Case Manager Clinical Goal(s):  [name] Over the next 90 days, patient  will work with the [place] CM to address needs related to education and support to improve Self management of MS . Over the next 90 days, patient will have increased knowledge and understanding about alternate DME options to consider to help improve her gait/mobility, such as forearm crutches  CCM SW Interventions: Completed 02/06/20 . Successful outbound call placed to the patient to assess goal progression . Determined the patient was informed  she is not eligible to receive home health at this time due to last OV with Dr. Felecia Shelling greater than 90 days ago . Discussed opportunity to schedule an OV with Dr. Felecia Shelling in order to obtain home health orders o The patient reports she would rather follow up with her primary care provider . Scheduled office visit with [name], FNP for June 7 . Collaboration with RN Care Manager, [place] to update on goal progression  CCM RN CM Interventions:  01/10/20 call completed with patient  . Evaluation of current treatment plan related to Multiple Sclerosis and patient's adherence to plan as established by provider . Discussed patient completed a video visit with Dr. Felecia Shelling to evaluate MS  . Determined patient continues to take Tecfidera w/o noted SE, she denies having recent MS exacerbation  . Discussed patient received and reviewed the printed ed mats providing information about use of [place] crutches however, she has not felt strong enough to be able to use them, she was considering returning them . Discussed and determined patient may benefit from having in home PT for a HSE and to assess for proper use/fit of the [place] crutches; Discussed patient may also benefit from having PT work with her on strengthening and balance, pt agrees . Placed outbound call to Dr. Garth Bigness office, spoke with nurse Terrence Dupont who will ask Dr. Felecia Shelling to place a [facility] referral for PT . Attempted outbound call to patient to make aware, but was unable to reach her . Discussed plans with patient for ongoing care management follow up and provided patient with direct contact information for care management team  Patient Self Care Activities:  . Self administers medications as prescribed . Attends all scheduled provider appointments . Calls pharmacy for medication refills . Performs ADL's independently . Performs IADL's independently . Calls provider office for new concerns or questions  Please see past updates related to this  goal by clicking on the "Past Updates" button in the selected goal          Follow Up Plan: SW will follow up with patient by phone over the next month.   Daneen Schick, BSW, CDP Social Worker, Certified Dementia Practitioner [place] / [place] [facility] [phone]

## 2020-02-06 NOTE — Patient Instructions (Signed)
Visit Information  Goals Addressed            This Visit's Progress     Patient Stated   . "I need to get a COVID vaccine but struggle with transportation" (pt-stated)   On track    [name] (see longitudinal plan of care for additional care plan information)  Current Barriers:  . Limited access to caregiver whom provides transportation . Limited knowledge of local pharmacy offering in home vaccinations to qualifying Medicare recipients .  Limited ability to perform iADL's independently due to progression of MS  Social Work Clinical Goal(s):  [name] Over the next 5 days the patient will receive first dose of COVID 19 vaccine  Goal Met . Over the next 30 days the patient will receive second dose of COVID 19 vaccine  CCM SW Interventions: Completed 02/06/20 . Inter-disciplinary care team collaboration (see longitudinal plan of care) . Successful outbound call placed to the patient to assess goal progression . Confirmed the patient received first dose of COVID 19 vaccine on May 20th in home via [employer] o The patient reports she was given a Pfizer vaccine in her right arm o The patient notes arm soreness to the injection site o Next planned vaccine scheduled for June 10 . Encouraged the patient to contact SW as needed . Scheduled outbound call over the next month to confirm goal met  Patient Self Care Activities:  . Patient verbalizes understanding of plan to received COVID 19 vaccine in the home from [facility] . Self administers medications as prescribed . Attends all scheduled provider appointments . Calls provider office for new concerns or questions . Unable to perform IADLs independently  Please see past updates related to this goal by clicking on the "Past Updates" button in the selected goal ;    . "slow down the progression of my MS" (pt-stated)   Not on track    [name] (see longtitudinal plan of care for additional care plan  information)  Current Barriers:  [name] Knowledge Deficits related to disease process and Self Health Management of MS  . Chronic Disease Management support and education needs related to Secondary Chronic Progressive MS, Gait disturbance, High Risk Medication, Constipation   Nurse Case Manager Clinical Goal(s):  [name] Over the next 90 days, patient will work with the [place] CM to address needs related to education and support to improve Self management of MS . Over the next 90 days, patient will have increased knowledge and understanding about alternate DME options to consider to help improve her gait/mobility, such as forearm crutches  CCM SW Interventions: Completed 02/06/20 . Successful outbound call placed to the patient to assess goal progression . Determined the patient was informed she is not eligible to receive home health at this time due to last OV with Dr. Felecia Shelling greater than 90 days ago . Discussed opportunity to schedule an OV with Dr. Felecia Shelling in order to obtain home health orders o The patient reports she would rather follow up with her primary care provider . Scheduled office visit with [name], FNP for June 7 . Collaboration with RN Care Manager, [place] to update on goal progression  CCM RN CM Interventions:  01/10/20 call completed with patient  . Evaluation of current treatment plan related to Multiple Sclerosis and patient's adherence to plan as established by provider . Discussed patient completed a video visit with Dr. Felecia Shelling to evaluate MS  . Determined patient continues to take  Tecfidera w/o noted SE, she denies having recent MS exacerbation  . Discussed patient received and reviewed the printed ed mats providing information about use of Canadian crutches however, she has not felt strong enough to be able to use them, she was considering returning them . Discussed and determined patient may benefit from having in home PT for a HSE and to assess for proper use/fit of the  Canadian crutches; Discussed patient may also benefit from having PT work with her on strengthening and balance, pt agrees . Placed outbound call to Dr. Garth Bigness office, spoke with nurse Terrence Dupont who will ask Dr. Felecia Shelling to place a [facility] referral for PT . Attempted outbound call to patient to make aware, but was unable to reach her . Discussed plans with patient for ongoing care management follow up and provided patient with direct contact information for care management team  Patient Self Care Activities:  . Self administers medications as prescribed . Attends all scheduled provider appointments . Calls pharmacy for medication refills . Performs ADL's independently . Performs IADL's independently . Calls provider office for new concerns or questions  Please see past updates related to this goal by clicking on the "Past Updates" button in the selected goal         The patient verbalized understanding of instructions provided today and declined a print copy of patient instruction materials.   SW will follow up with the patient over the next month.  [name], BSW, CDP Social Worker, Certified Dementia Practitioner [place] [facility] [place] [facility] [phone]

## 2020-02-08 NOTE — Telephone Encounter (Signed)
Per Home Health Patient needs apt for insurance to cover home health it can be a video visit . Thanks Erline Hau Has accepted Patient .

## 2020-02-09 NOTE — Telephone Encounter (Signed)
FYI

## 2020-02-09 NOTE — Telephone Encounter (Signed)
Called pt and relayed message per Hinton Dyer. Scheduled mychart VV for 02/23/20 at 2pm with Dr. Felecia Shelling.

## 2020-02-14 NOTE — Telephone Encounter (Signed)
Drew from [employer] is aware.

## 2020-02-20 ENCOUNTER — Ambulatory Visit (INDEPENDENT_AMBULATORY_CARE_PROVIDER_SITE_OTHER): Payer: BC Managed Care – PPO | Admitting: Nurse Practitioner

## 2020-02-20 ENCOUNTER — Encounter: Payer: Self-pay | Admitting: Nurse Practitioner

## 2020-02-20 ENCOUNTER — Ambulatory Visit: Payer: Self-pay

## 2020-02-20 ENCOUNTER — Telehealth: Payer: Self-pay

## 2020-02-20 ENCOUNTER — Other Ambulatory Visit: Payer: Self-pay

## 2020-02-20 VITALS — BP 116/88 | HR 105 | Temp 98.1°F | Ht 66.0 in | Wt 280.0 lb

## 2020-02-20 DIAGNOSIS — R269 Unspecified abnormalities of gait and mobility: Secondary | ICD-10-CM

## 2020-02-20 DIAGNOSIS — Z139 Encounter for screening, unspecified: Secondary | ICD-10-CM

## 2020-02-20 DIAGNOSIS — G35 Multiple sclerosis: Secondary | ICD-10-CM | POA: Diagnosis not present

## 2020-02-20 DIAGNOSIS — M6281 Muscle weakness (generalized): Secondary | ICD-10-CM

## 2020-02-20 DIAGNOSIS — R609 Edema, unspecified: Secondary | ICD-10-CM

## 2020-02-20 DIAGNOSIS — M21372 Foot drop, left foot: Secondary | ICD-10-CM

## 2020-02-20 DIAGNOSIS — G35D Multiple sclerosis, unspecified: Secondary | ICD-10-CM

## 2020-02-20 NOTE — Progress Notes (Signed)
°This visit occurred during the SARS-CoV-2 public health emergency.  Safety protocols were in place, including screening questions prior to the visit, additional usage of staff PPE, and extensive cleaning of exam room while observing appropriate contact time as indicated for disinfecting solutions. ° °Subjective:  °  ° Patient ID: [name] , female    DOB: 08/14/1966 , 54 y.o.   MRN: [id number] ° ° °No chief complaint on file. ° ° °HPI ° °She is here today for follow up and needs an order for  ° °She is here for problems with ambulating at home, unable to use the foot rest. She will become tired with bathing and dressing mainly her pants or shoes. She is using a walker 100%.  She is trying to get crutches specific for patients with MS known as Canadian Crutches.  She uses her walker to ambulate throughout the house. Her goal is to get back to walking on the cane.  She has not had any falls in over a year.  She has had several near falls but will grab the walker.   ° °She is due for her second covid vaccine on June 10th.  °  ° °Past Medical History:  °Diagnosis Date  °• Movement disorder   °• Multiple sclerosis (HCC)   °  ° °Family History  °Problem Relation Age of Onset  °• Hypertension Mother   °• Hypertension Father   °• Diabetes Father   ° ° ° °Current Outpatient Medications:  °•  CVS B12 QUICK DISSOLVE 500 MCG LOZG, TAKE 2 TABS BY MOUTH DAILY, Disp: 180 lozenge, Rfl: 1 °•  CVS D3 50 MCG (2000 UT) CAPS, TAKE 1 CAPSULE BY MOUTH DAILY OR AS DIRECTED, Disp: 90 capsule, Rfl: 3 °•  Multiple Vitamin (MULTIVITAMIN ADULT PO), Take 1 Dose by mouth daily., Disp: , Rfl:  °•  OVER THE COUNTER MEDICATION, Take 1 Dose by mouth daily. [place]-  Immune supplement, Disp: , Rfl:  °•  TECFIDERA 240 MG CPDR, Take 1 capsule by mouth twice daily, Disp: 180 capsule, Rfl: 3  ° °Allergies  °Allergen Reactions  °• Food Other (See Comments)  °  WHAT FOOD CAUSES THIS REACTION? °Eyes swelling and throat swelling  °• Oxycodone Hcl  Hives  °• Soy Allergy   °  Other reaction(s): SWELLING  °  ° °Review of Systems  °Constitutional: Negative.   °Respiratory: Negative.   °Cardiovascular: Negative.  Negative for chest pain, palpitations and leg swelling.  °Neurological: Negative for dizziness and headaches.  °Psychiatric/Behavioral: Negative.   °  ° °Today's Vitals  ° 02/20/20 1517  °BP: 116/88  °Pulse: (!) 105  °Temp: 98.1 °F (36.7 °C)  °TempSrc: Oral  °Weight: 280 lb (127 kg)  °Height: 5' 6" (1.676 m)  °PainSc: 0-No pain  ° °Body mass index is 45.19 kg/m².  ° °Objective:  °Physical Exam °Constitutional:   °   General: She is not in acute distress. °   Appearance: Normal appearance.  °Cardiovascular:  °   Rate and Rhythm: Normal rate and regular rhythm.  °   Pulses: Normal pulses.  °   Heart sounds: Normal heart sounds. No murmur heard.  ° °Pulmonary:  °   Effort: Pulmonary effort is normal. No respiratory distress.  °   Breath sounds: Normal breath sounds.  °Musculoskeletal:     °   General: No tenderness.  °   Comments: Sitting in wheelchair, minimal active range of motion  °Skin: °   General: Skin   is warm and dry.  °   Capillary Refill: Capillary refill takes less than 2 seconds.  °Neurological:  °   General: No focal deficit present.  °   Mental Status: She is alert and oriented to person, place, and time.  °   Cranial Nerves: No cranial nerve deficit.  °Psychiatric:     °   Mood and Affect: Mood normal.     °   Behavior: Behavior normal.     °   Thought Content: Thought content normal.     °   Judgment: Judgment normal.  ° °  ° °   °Assessment And Plan:  °   °1. Encounter for screening °· Will check for Hepatitis C screening due to being born between the years [birth year] °- Hepatitis C antibody ° °2. Multiple sclerosis (HCC) °· Being followed by Neurology °- CMP14+EGFR °- Ambulatory referral to Home Health ° °3. Gait disturbance °· Limited walking she needs education on the special crutches she will be using  °- Ambulatory referral to Home  Health ° °4. Muscle weakness °· Increased risk for falls °· Will refer to home health for strength training as well. °- Ambulatory referral to Home Health ° ° °Christina Moore, FNP  ° ° °THE PATIENT IS ENCOURAGED TO PRACTICE SOCIAL DISTANCING DUE TO THE COVID-19 PANDEMIC.   °

## 2020-02-21 LAB — CMP14+EGFR
ALT: 23 IU/L (ref 0–32)
AST: 17 IU/L (ref 0–40)
Albumin/Globulin Ratio: 1.3 (ref 1.2–2.2)
Albumin: 4.3 g/dL (ref 3.8–4.9)
Alkaline Phosphatase: 69 IU/L (ref 48–121)
BUN/Creatinine Ratio: 14 (ref 9–23)
BUN: 11 mg/dL (ref 6–24)
Bilirubin Total: 0.3 mg/dL (ref 0.0–1.2)
CO2: 21 mmol/L (ref 20–29)
Calcium: 9.6 mg/dL (ref 8.7–10.2)
Chloride: 100 mmol/L (ref 96–106)
Creatinine, Ser: 0.8 mg/dL (ref 0.57–1.00)
GFR calc Af Amer: 97 mL/min/1.73
GFR calc non Af Amer: 84 mL/min/1.73
Globulin, Total: 3.3 g/dL (ref 1.5–4.5)
Glucose: 96 mg/dL (ref 65–99)
Potassium: 3.9 mmol/L (ref 3.5–5.2)
Sodium: 139 mmol/L (ref 134–144)
Total Protein: 7.6 g/dL (ref 6.0–8.5)

## 2020-02-21 LAB — HEPATITIS C ANTIBODY: Hep C Virus Ab: <0.1 s/co ratio (ref 0.0–0.9)

## 2020-02-22 ENCOUNTER — Telehealth: Payer: Self-pay

## 2020-02-22 NOTE — Chronic Care Management (AMB) (Signed)
Care Management   Follow Up Note   02/21/2020 Name: [name] MRN: [id number] DOB: [birth year]  Referred by: [name], FNP Reason for referral : Care Coordination (FU RN CC Call - [facility])   [name] Pain is a 54 y.o. year old female who is a primary care patient of [name], [place]. The CCM team was consulted for assistance with chronic disease management and care coordination needs.    Review of patient status, including review of consultants reports, relevant laboratory and other test results, and collaboration with appropriate care team members and the patient's provider was performed as part of comprehensive patient evaluation and provision of chronic care management services.    SDOH (Social Determinants of Health) assessments performed: Yes - no acute needs  See Care Plan activities for detailed interventions related to [place])   Placed outbound CC RN CM follow up call to patient for a care plan update.     Outpatient Encounter Medications as of 02/20/2020  Medication Sig  . CVS B12 QUICK DISSOLVE 500 MCG LOZG TAKE 2 TABS BY MOUTH DAILY  . CVS D3 50 MCG (2000 UT) CAPS TAKE 1 CAPSULE BY MOUTH DAILY OR AS DIRECTED  . Multiple Vitamin (MULTIVITAMIN ADULT PO) Take 1 Dose by mouth daily.  [name] OVER THE COUNTER MEDICATION Take 1 Dose by mouth daily. [place]-  Immune supplement  . TECFIDERA 240 MG CPDR Take 1 capsule by mouth twice daily   No facility-administered encounter medications on file as of 02/20/2020.     Objective:  No results found for: HGBA1C Lab Results  Component Value Date   CREATININE 0.80 02/20/2020   BP Readings from Last 3 Encounters:  02/20/20 116/88  06/13/19 112/74  11/19/18 132/80    Goals Addressed      Patient Stated   . "I use to take thyroid medication but stopped it" (pt-stated)       CARE PLAN ENTRY (see longitudinal plan of care for additional care plan information)  Current Barriers:  [name] Knowledge Deficits related to diagnosis  and treatment of thyroid disorder   . Chronic Disease Management support and education needs related to Secondary Chronic Progressive MS, Gait disturbance, High Risk Medication, Constipation   Nurse Case Manager Clinical Goal(s):  [name] Over the next 90 days, patient will work with [place] RN CM and PCP to address needs related to evaluation and treatment of thyroid disorder  CCM RN CM Interventions:  02/21/20 call completed with patient  . Inter-disciplinary care team collaboration (see longitudinal plan of care) . Evaluation of current treatment plan related to thyroid disorder and patient's adherence to plan as established by provider . Determined patient was diagnosed with having a thyroid disorder 1-2 years ago when being treated by an Integrated NP . Determined she is unsure what type of thyroid disorder she was diagnosed with and was started on oral medication . Determined she stopped taking the medication due feeling she didn't need it and did not f/u with the NP . Current Mrs. Art is c/o having several skin issues such as drying and flaking without resolution from Self management with OTC products . Provided education to patient re: potential cause's for this condition to occur including thyroid disease  . Collaborated with PCP [name], FNP regarding patient's reported symptoms and reports of prior dx/tx of thyroid disease; requested a thyroid panel be ordered via [place] per [facility] since PT will be providing services and patient has difficulty making MD  face to face visits . Discussed plans with patient for ongoing care management follow up and provided patient with direct contact information for care management team . Provided patient with printed educational materials related to 6 Common Thyroid Disorders  Patient Self Care Activities:  . Self administers medications as prescribed . Attends all scheduled provider appointments . Calls pharmacy for medication refills . Calls  provider office for new concerns or questions  Initial goal documentation     . "slow down the progression of my MS" (pt-stated)   Not on track    [place] (see longtitudinal plan of care for additional care plan information)  Current Barriers:  [name] Knowledge Deficits related to disease process and Self Health Management of MS  . Chronic Disease Management support and education needs related to Secondary Chronic Progressive MS, Gait disturbance, High Risk Medication, Constipation   Nurse Case Manager Clinical Goal(s):  [name] Over the next 90 days, patient will work with the [name] CM to address needs related to education and support to improve Self management of MS . Over the next 90 days, patient will have increased knowledge and understanding about alternate DME options to consider to help improve her gait/mobility, such as forearm crutches  CCM RN CM Interventions:  02/21/20 call completed with patient  . Evaluation of current treatment plan related to Multiple Sclerosis and patient's adherence to plan as established by provider . Determined in home PT was not started due to patient was required to perform a face to face visit with prescribing MD for approval of these services . Determined patient f/u with PCP [name], FNP and a new in home PT referral [facility] . Discussed a telephone visit with Neurologist, Dr. Felecia Shelling is also scheduled  . Determined patient continues to take Tecfidera w/o noted SE, she denies having recent MS exacerbation, however, she does experience muscle rigidity and leg weakness . Determined patient would like to learn more about treatment options to consider, although, she doesn't necessarily prefer to take a new medication for this condition  . Discussed sending patient a compiled list of MS drugs found on the nationalmssociety.org website to be sent via [employer] . Discussed sending an embedded Pharm D referral to assist patient  with questions related to alternative medications such as CBD and Taopatch for MD to help improve mobility  . Encouraged patient to also discuss her concerns and her goals with Dr. Felecia Shelling at next visit  . Discussed plans with patient for ongoing care management follow up and provided patient with direct contact information for care management team  Patient Self Care Activities:  . Self administers medications as prescribed . Attends all scheduled provider appointments . Calls pharmacy for medication refills . Performs ADL's independently . Performs IADL's independently . Calls provider office for new concerns or questions  Please see past updates related to this goal by clicking on the "Past Updates" button in the selected goal         Plan:   Telephone follow up appointment with care management team member scheduled for: 03/21/20  Barb Merino, RN, BSN, CCM Care Management Coordinator [facility]  Direct Phone: [phone]

## 2020-02-22 NOTE — Telephone Encounter (Signed)
Pt notified about lab results

## 2020-02-22 NOTE — Patient Instructions (Signed)
Visit Information  Goals Addressed      Patient Stated   . "I use to take thyroid medication but stopped it" (pt-stated)       CARE PLAN ENTRY (see longitudinal plan of care for additional care plan information)  Current Barriers:  [name] Knowledge Deficits related to diagnosis and treatment of thyroid disorder   . Chronic Disease Management support and education needs related to Secondary Chronic Progressive MS, Gait disturbance, High Risk Medication, Constipation   Nurse Case Manager Clinical Goal(s):  [name] Over the next 90 days, patient will work with [place] RN CM and PCP to address needs related to evaluation and treatment of thyroid disorder  CCM RN CM Interventions:  02/21/20 call completed with patient  . Inter-disciplinary care team collaboration (see longitudinal plan of care) . Evaluation of current treatment plan related to thyroid disorder and patient's adherence to plan as established by provider . Determined patient was diagnosed with having a thyroid disorder 1-2 years ago when being treated by an Integrated NP . Determined she is unsure what type of thyroid disorder she was diagnosed with and was started on oral medication . Determined she stopped taking the medication due feeling she didn't need it and did not f/u with the NP . Current Mrs. Cyr is c/o having several skin issues such as drying and flaking without resolution from Self management with OTC products . Provided education to patient re: potential cause's for this condition to occur including thyroid disease  . Collaborated with PCP [name], FNP regarding patient's reported symptoms and reports of prior dx/tx of thyroid disease; requested a thyroid panel be ordered via [place] per [facility] since PT will be providing services and patient has difficulty making MD face to face visits . Discussed plans with patient for ongoing care management follow up and provided patient with direct contact information for care  management team . Provided patient with printed educational materials related to 6 Common Thyroid Disorders  Patient Self Care Activities:  . Self administers medications as prescribed . Attends all scheduled provider appointments . Calls pharmacy for medication refills . Calls provider office for new concerns or questions  Initial goal documentation     . "slow down the progression of my MS" (pt-stated)   Not on track    [place] (see longtitudinal plan of care for additional care plan information)  Current Barriers:  [name] Knowledge Deficits related to disease process and Self Health Management of MS  . Chronic Disease Management support and education needs related to Secondary Chronic Progressive MS, Gait disturbance, High Risk Medication, Constipation   Nurse Case Manager Clinical Goal(s):  [name] Over the next 90 days, patient will work with the [place] CM to address needs related to education and support to improve Self management of MS . Over the next 90 days, patient will have increased knowledge and understanding about alternate DME options to consider to help improve her gait/mobility, such as forearm crutches  CCM RN CM Interventions:  02/21/20 call completed with patient  . Evaluation of current treatment plan related to Multiple Sclerosis and patient's adherence to plan as established by provider . Determined in home PT was not started due to patient was required to perform a face to face visit with prescribing MD for approval of these services . Determined patient f/u with PCP [name], FNP and a new in home PT referral was [facility] . Discussed a telephone visit with Neurologist,  Dr. Felecia Shelling is also scheduled  . Determined patient continues to take Tecfidera w/o noted SE, she denies having recent MS exacerbation, however, she does experience muscle rigidity and leg weakness . Determined patient would like to learn more about treatment options to consider,  although, she doesn't necessarily prefer to take a new medication for this condition  . Discussed sending patient a compiled list of MS drugs found on the nationalmssociety.org website to be sent via [employer] . Discussed sending an embedded Pharm D referral to assist patient with questions related to alternative medications such as CBD and Taopatch for MD to help improve mobility  . Encouraged patient to also discuss her concerns and her goals with Dr. Felecia Shelling at next visit  . Discussed plans with patient for ongoing care management follow up and provided patient with direct contact information for care management team  Patient Self Care Activities:  . Self administers medications as prescribed . Attends all scheduled provider appointments . Calls pharmacy for medication refills . Performs ADL's independently . Performs IADL's independently . Calls provider office for new concerns or questions  Please see past updates related to this goal by clicking on the "Past Updates" button in the selected goal        Patient verbalizes understanding of instructions provided today.   Telephone follow up appointment with care management team member scheduled for: 03/21/20  Barb Merino, RN, BSN, CCM Care Management Coordinator [place] [facility]  Direct Phone: [phone]

## 2020-02-23 ENCOUNTER — Telehealth (INDEPENDENT_AMBULATORY_CARE_PROVIDER_SITE_OTHER): Payer: BC Managed Care – PPO | Admitting: Neurology

## 2020-02-23 DIAGNOSIS — E559 Vitamin D deficiency, unspecified: Secondary | ICD-10-CM

## 2020-02-23 DIAGNOSIS — R5383 Other fatigue: Secondary | ICD-10-CM | POA: Diagnosis not present

## 2020-02-23 DIAGNOSIS — R269 Unspecified abnormalities of gait and mobility: Secondary | ICD-10-CM | POA: Diagnosis not present

## 2020-02-23 DIAGNOSIS — R350 Frequency of micturition: Secondary | ICD-10-CM

## 2020-02-23 DIAGNOSIS — Z79899 Other long term (current) drug therapy: Secondary | ICD-10-CM | POA: Diagnosis not present

## 2020-02-23 DIAGNOSIS — G35 Multiple sclerosis: Secondary | ICD-10-CM | POA: Diagnosis not present

## 2020-02-23 NOTE — Progress Notes (Signed)
[facility]  PATIENT: [name] DOB: 12-06-1965  REFERRING CLINICIAN: Willey Blade HISTORY FROM: Patient  REASON FOR VISIT: Multiple sclerosis   HISTORICAL  CHIEF COMPLAINT:  No chief complaint on file.   HISTORY OF PRESENT ILLNESS:  Ms. Moten is a 54 y.o. woman with relapsing remitting MS diagnosed in 1997.    Update 02/23/20: I connected with [name]  on 10/31/19 at 11:00 AM EST by a telephone enabled telemedicine application and verified that I am speaking with the correct person.  I discussed the limitations of evaluation and management by telemedicine and the availability of in person appointments. The patient expressed understanding and agreed to proceed.  She was in her home and I was in the office  History of Present Illness: She feels her MS has been stable.  She has no exacerbations or new symptoms.   She is on Tecfidera and tolerates it well.   She feels mostly stale on Tecfidera 240 mg bid and tolerates it well.    Labs 06/15/2019 show lymphocyte count of 1.3.   PT has helped her mobility in the past  She needs to use a walker because of leg weakness.  She also has spasticity.  She cannot walk more than a few steps without the walker.   She can't walk without her walker.  She can go > 200 feet with a walker.  She had one fall when she slipped with a new pair of shoes.   Her arms are strong but the left arm and hand are slightly slower than her right.    Bladder is doing better since using a supplement Publishing copy).    Vision is fine.     She has some fatigue but it does not prevent her from activities in general.  She sleeps well most well.   She is eating well and takes vitamins.   She takes vitamin D supplements.  She denies depression or anxiety.  Cognition is doing well.  Her joint pain is milder now than it was at the last visit.      Observations/Objective:  She is alert and oriented with normal speech  Assessment and  Plan: Multiple sclerosis (HCC)  Gait abnormality  Urinary frequency  High risk medication use  Other fatigue  Avitaminosis D   1.   Continue Tecfidera.  I advised her to try to get a CBC with differential next time she has a doctor's appointment.  Otherwise we will check 1 at the next visit.  Her last lymphocyte count was fine.    2.   She will continue to take vitamin D. 3.   We will request more PT for her gait.  Continue to use a walker around the house and for short distances and wheelchair outside of the home. 4.  rtc 6 months, sooner if problems    Follow Up Instructions:  I discussed the assessment and treatment plan with the patient. The patient was provided an opportunity to ask questions and all were answered. The patient agreed with the plan and demonstrated an understanding of the instructions.    The patient was advised to call back or seek an in-person evaluation if the symptoms worsen or if the condition fails to improve as anticipated.  I provided 90minutes of non-face-to-face time during this encounter.  Update 02/08/2019 (virtual) She feels mostly stale on Tecfidera 240 mg bid and tolerates it well.  Unfortunately, most recent labs at PCP did not included differential  but lymphocytes were a little low at 0.5 when she went to the ED for a UTI in 10/28/2017.  I discussed with her that we would need to recheck this and make sure that the lymphocyte count has not dropped too much.  She denies GI upset or flushing.  Her main problem is walking.  She uses a walker almost exclusively now.   No recent falls.    We had discussed Ampyra but she was concerned about seizure risk.   Her left leg is weaker than the right but they are close to symmetric now.  Arms are strong and symmetric.   She has spasticity in both legs.   No numbness.   She has urinary frequency.  Vision is normal and symmetric.    She has some fatigue and feels vitamins like CoQ10 have helped.  She sleeps well at  night and often takes a nap.    She feels mood is doing well.  Cognition is doing okay.  Her joint pain is better with supplements including tumeric and curcumin.     She takes Vit D supplements as well.   Update 05/31/2018: She feels she is mostly stable.   With a walker she can walk around her house.    She feels she is mildly better compared to earlier in the year.     She has ben reluctant to try Ampyra due to the risk of seizures.    She denies numbness (occ tingling).   Her left leg is usually weaker than the right but sometimes legs are equal.     She has urinary urgency and has frequency (q 2-3 hours).     She falls asleep easily but has 2 x nocturia.   She usually falls back asleep easily.    She has some fatigue and   She has some joint pain.   She stopped meloxicam and is on tumeric and feels it is better than the NSAID.      Update 11/19/2017: She feels her MS is stable. She is on Tecfidera. She tolerates it well. She has not had any recent exacerbations.    The MRI of the brain performed 06/22/2017 was compared to the MRI dated 06/06/2015. She has lesions in the hemispheres and brainstem consistent with MS. None of them were acute and there were no changes over the 2 year interval.     She has multiple foci in both the cervical and thoracic spine none of them appear to be acute.  Her gait is about the same as 6 months ago.    She uses a walker because ankles are weak.   Outside, sometimes she will use a wheelchair.    She notes pain in the ankles and feet.       There is ankle discomfort mor ethan foot.   She has a  little hand numbness.    Bladder is about the same with frequency and very rare incontinence.    She notes fatigue.   Sometimes she is sleepy during the day.   She spends a lot of time in her home.    She denies depression.   She used to be more active and did water therapy that she enjoyed.    She sleeps well at night Cognition is fine.    From 04/23/2017:  MS:    She is on  Tecfidera and she tolerates it well.  No flushing or GI issues.   She denies any recent exacerbation.[name]  Her last MRI of the brain was 06/08/2015. It did not show any enhancing lesions. An MRI of the cervical spine 06/08/2015 showed several foci within the spinal cord consistent with MS. These images were personally reviewed today.  Gait/strength/sensation:  Her gait is a little worse compared to her last visit.    she uses a walker around the house and wheelchair for long distances outside.   The left leg is worse than the right leg and she has left AFO brace for foot drop but does not use it recently.   She denies any weakness in the arms. He r left leg is spastic.   She notes left leg ataxia.   Right leg issues are milder.  We had discussed Ampyra in the past. 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        Bladder:   She feels her bladder is better with only mild frequency now.   She does not need any medication.    Vision:  She denies any MS related vision changes.  Fatigue:   She notes mild fatigue most days. The fatigue worsens as the day goes on. She is often sleepy and takes naps most days. She sleeps well at night.   She does not snore.       .  Mood/cognition:  She feels mood is doing well though she is sometimes irritable. She denies any cognitive dysfunction. There is no difficulty with verbal fluency or short-term memory.  LBP/leg pain:   Her back and radicular left leg pain resolved.    Pain increases with bending.   NSAIDs help.     A chiropractor told her one leg is shorter than another and she feels manipulations are helping some.    MS History:   In 1997, she presented with backache, poor gait and visual blurring. She had an MRI and a lumbar puncture. The studies were consistent with multiple sclerosis.  She turned down a disease modifying therapy at that time. Her legs and her walking got completely better and she  was back at baseline after the steroids. Over the next 15 years or so she had no additional symptoms. She had worsening symptoms in the left leg that was affecting her balance. MRI 2-3 years ago showed progression. She opted not to go on medication at that time.   She went to the [place] [facility] at [facility] for several visits. At that time she was started on Tecfidera and she tolerates it well and has not had much flushing or GI symptoms. She has not had definite exacerbations. However, her gait has worsened and she has gone from a cane to a walker.  REVIEW OF SYSTEMS:  Constitutional: No fevers, chills, sweats, or change in appetite Eyes: No visual changes, double vision, eye pain Ear, nose and throat: No hearing loss, ear pain, nasal congestion, sore throat Cardiovascular: No chest pain, palpitations Respiratory:  No shortness of breath at rest or with exertion.   No wheezes GastrointestinaI: No nausea, vomiting, diarrhea, abdominal pain, fecal incontinence Genitourinary:  No dysuria, urinary retention or frequency.  No nocturia. Musculoskeletal:  No neck pain.   She reports some back pain and occasionally proximal leg pain. Integumentary: No rash, pruritus, skin lesions Neurological: as above Psychiatric: She denies depression but notes that she is sometimes moody. There is no anxiety. Endocrine: No palpitations, diaphoresis, change in appetite or increased thirst Hematologic/Lymphatic:  No anemia, purpura, petechiae. Allergic/Immunologic: No itchy/runny eyes, nasal congestion, recent allergic reactions, rashes  ALLERGIES: Allergies  Allergen Reactions  . Food Other (See Comments)    WHAT FOOD CAUSES THIS REACTION? Eyes swelling and throat swelling  . Oxycodone Hcl Hives  . Soy Allergy     Other reaction(s): SWELLING    HOME MEDICATIONS: Outpatient Medications Prior to Visit  Medication Sig Dispense Refill  . CVS B12 QUICK DISSOLVE 500 MCG LOZG TAKE 2 TABS BY MOUTH DAILY 180 lozenge 1  . CVS  D3 50 MCG (2000 UT) CAPS TAKE 1 CAPSULE BY MOUTH DAILY OR AS DIRECTED 90 capsule 3  . Multiple Vitamin (MULTIVITAMIN ADULT PO) Take 1 Dose by mouth daily.    [name] OVER THE COUNTER MEDICATION Take 1 Dose by mouth daily. [place]-  Immune supplement    . TECFIDERA 240 MG CPDR Take 1 capsule by mouth twice daily 180 capsule 3   No facility-administered medications prior to visit.    PAST MEDICAL HISTORY: Past Medical History:  Diagnosis Date  . Movement disorder   . Multiple sclerosis ([place])     PAST SURGICAL HISTORY: Past Surgical History:  Procedure Laterality Date  . CESAREAN SECTION    . UTERINE FIBROID SURGERY    . WISDOM TOOTH EXTRACTION      FAMILY HISTORY: Family History  Problem Relation Age of Onset  . Hypertension Mother   . Hypertension Father   . Diabetes Father     SOCIAL HISTORY:  Social History   Socioeconomic History  . Marital status: Married    Spouse name: Not on file  . Number of children: Not on file  . Years of education: Not on file  . Highest education level: Not on file  Occupational History  . Not on file  Tobacco Use  . Smoking status: Never Smoker  . Smokeless tobacco: Never Used  Substance and Sexual Activity  . Alcohol use: No  . Drug use: No  . Sexual activity: Not on file  Other Topics Concern  . Not on file  Social History Narrative  . Not on file   Social Determinants of Health   Financial Resource Strain:   . Difficulty of Paying Living Expenses:   Food Insecurity:   . Worried About Charity fundraiser in the Last Year:   . Arboriculturist in the Last Year:   Transportation Needs:   . Film/video editor (Medical):   [name] Lack of Transportation (Non-Medical):   Physical Activity:   . Days of Exercise per Week:   . Minutes of Exercise per Session:   Stress:   . Feeling of Stress :   Social Connections:   . Frequency of Communication with Friends and Family:   . Frequency of Social Gatherings with Friends and  Family:   . Attends Religious Services:   . Active Member of Clubs or Organizations:   . Attends Archivist Meetings:   [name] Marital Status:   Intimate Partner Violence:   . Fear of Current or Ex-Partner:   . Emotionally Abused:   [name] Physically Abused:   . Sexually Abused:      PHYSICAL EXAM from last live visit  There were no vitals filed for this visit.  There is no height or weight on file to calculate BMI.   General: The patient is well-developed and well-nourished and in no acute distress.   She uses a walker   Neurologic Exam  Mental status:  The patient is alert and oriented x 3 at the time of the examination. The patient has apparent normal recent and remote memory, with an apparently normal attention span and concentration ability.   Speech is normal.  Cranial nerves: She has bilateral INO's and nystagmus.  Facial symmetry is present. Facial strength and sensation is normal. Trapezius and sternocleidomastoid strength is normal. No dysarthria is noted.  The tongue is midline, and the patient has symmetric elevation of the soft palate. No obvious hearing deficits are noted.  Motor:  Muscle bulk is normal.  Muscle tone is increased in the legs, left greater than right.  Muscle tone was fairly normal in the arms.  Strength was normal in the right arm and just minimally reduced in the left arm (triceps) though left rapid alternating movements were slowed.  In the legs, she has reduced strength bilaterally , 3/5 on the right with 4-/5 quads and 2-3/5 on the left.    Sensory: In the arms, there was normal sensation to touch and vibration. She had normal touch sensation in the legs but reduced vibratory sensation in the left leg.  \  Coordination: Cerebellar testing reveals good finger-nose-finger and is unable to do heel-to-shin  Gait and station: Station is unstable and she does better with unilateral support.   Positive Romberg,   . Wide stride with left foot drop and  needs unilateral support for balance.    Reflexes: Deep tendon reflexes are increased on let more than right legs.     Jakeira Seeman A. Felecia Shelling, MD, PhD 8/91/6945, 03:88 AM Certified in Neurology, Clinical Neurophysiology, Sleep Medicine, Pain Medicine and Neuroimaging  [facility] [address], [place], [place] [address] [phone]

## 2020-02-24 ENCOUNTER — Encounter: Payer: Self-pay | Admitting: Neurology

## 2020-02-24 ENCOUNTER — Telehealth: Payer: Self-pay | Admitting: Nurse Practitioner

## 2020-02-24 NOTE — Chronic Care Management (AMB) (Signed)
  Chronic Care Management   Note  02/24/2020 Name: SHANESHA BEDNARZ MRN: [id number] DOB: 1966/07/13  [name] Frank is a 54 y.o. year old female who is a primary care patient of [name], [place]. Ky A Holian is currently enrolled in care management services. An additional referral for Pharm D was placed.   Follow up plan: Unsuccessful telephone outreach attempt made. A HIPPA compliant phone message was left for the patient providing contact information and requesting a return call.  The care management team will reach out to the patient again over the next 7 days.  If patient returns call to provider office, please advise to call Embedded Care Management Care Guide [name] LPN at [phone]  Natsumi Whitsitt, LPN Health Advisor, [place] [facility] ??[email or web address] ??[phone]

## 2020-02-27 NOTE — Chronic Care Management (AMB) (Signed)
  Chronic Care Management   Outreach Note  02/27/2020 Name: LOTUS SANTILLO MRN: [id number] DOB: 12-12-1965  [name] [place] is a 54 y.o. year old female who is a primary care patient of [name], [place]. I reached out to [name] by phone today in response to a referral sent by Ms. Paitynn A Withers's PCP, [name] FNP     A second unsuccessful telephone outreach was attempted today. The patient was referred to the case management team for assistance with care management and care coordination.   Follow Up Plan: A HIPPA compliant phone message was left for the patient providing contact information and requesting a return call.  The care management team will reach out to the patient again over the next 7  days.  If patient returns call to provider office, please advise to call Embedded Care Management Care Guide [name] LPN at [phone]  Cloy Cozzens, LPN Health Advisor, [place] [facility] ??[email or web address] ??[phone]

## 2020-02-28 ENCOUNTER — Ambulatory Visit: Payer: Self-pay

## 2020-02-28 DIAGNOSIS — M6281 Muscle weakness (generalized): Secondary | ICD-10-CM

## 2020-02-28 DIAGNOSIS — R269 Unspecified abnormalities of gait and mobility: Secondary | ICD-10-CM

## 2020-02-28 DIAGNOSIS — G35 Multiple sclerosis: Secondary | ICD-10-CM

## 2020-02-28 NOTE — Chronic Care Management (AMB) (Signed)
Care Management    Social Work Follow Up Note  02/28/2020 Name: [name] MRN: [id number] DOB: June 14, 1966  [name] is a 54 y.o. year old female who is a primary care patient of [name], [place]. The CCM team was consulted for assistance with care coordination.   Review of patient status, including review of consultants reports, other relevant assessments, and collaboration with appropriate care team members and the patient's provider was performed as part of comprehensive patient evaluation and provision of chronic care management services.    SDOH (Social Determinants of Health) assessments performed: No    Outpatient Encounter Medications as of 02/28/2020  Medication Sig   CVS B12 QUICK DISSOLVE 500 MCG LOZG TAKE 2 TABS BY MOUTH DAILY   CVS D3 50 MCG (2000 UT) CAPS TAKE 1 CAPSULE BY MOUTH DAILY OR AS DIRECTED   Multiple Vitamin (MULTIVITAMIN ADULT PO) Take 1 Dose by mouth daily.   OVER THE COUNTER MEDICATION Take 1 Dose by mouth daily. [name]-  Immune supplement   TECFIDERA 240 MG CPDR Take 1 capsule by mouth twice daily   No facility-administered encounter medications on file as of 02/28/2020.     Goals Addressed              This Visit's Progress     Patient Stated     "I need to get a COVID vaccine but struggle with transportation" (pt-stated)        [place] (see longitudinal plan of care for additional care plan information)  Current Barriers:   Limited access to caregiver whom provides transportation  Limited knowledge of local pharmacy offering in home vaccinations to qualifying Medicare recipients   Limited ability to perform iADL's independently due to progression of MS  Social Work Clinical Goal(s):   Over the next 5 days the patient will receive first dose of COVID 19 vaccine  Goal Met  Over the next 30 days the patient will receive second dose of COVID 19 vaccine  CCM SW Interventions: Completed 02/28/20  Successful  outbound call placed to the patient to assess progression of patient stated goal  Determined the patient did not receive second COVID 19 vaccine dose due to inability to answer the door quick enough and the pharmacist leaving the patient home o The patient reports she did contact [name] who reported they would call at a later date to reschedule but have yet to do so  Discussed new [name] vaccine administration program for home bound patients  Advised the patient to contact [name] over the next two days to confirm if the pharmacist plans to administer second vaccine  Encouraged the patient to contact SW directly if she in unable to receive vaccine from [name] to allow SW to refer the patient to [name]  Scheduled follow up over the next week  Patient Self Care Activities:   Patient verbalizes understanding of plan to received COVID 19 vaccine in the home from [name] administers medications as prescribed  Attends all scheduled provider appointments  Calls provider office for new concerns or questions  Unable to perform IADLs independently  Please see past updates related to this goal by clicking on the "Past Updates" button in the selected goal ;      "slow down the progression of my MS" (pt-stated)        [place] (see longtitudinal plan of care for additional care plan information)  Current Barriers:  Knowledge Deficits related to disease process and Self Health Management of MS   Chronic Disease Management support and education needs related to Secondary Chronic Progressive MS, Gait disturbance, High Risk Medication, Constipation   Nurse Case Manager Clinical Goal(s):   Over the next 90 days, patient will work with the CCM RN CM to address needs related to education and support to improve Self management of MS  Over the next 90 days, patient will have increased knowledge and understanding about alternate DME options to  consider to help improve her gait/mobility, such as forearm crutches  CCM SW Interventions: Completed 02/28/20  Successful outbound call placed to the patient to review goal progression  Performed chart review to note recent unsuccessful call attempts to schedule patient appointment with embedded PharmD  Determined the patient is not longer interested in PharmD referral as she has completely a recent tele health visit with [name] and feels all questions surrounding MS have been answered o SW closed current PharmD referral and encouraged patient to contact care management team if she changes her mind at a later date o Collaboration with RN Care Manager to inform of referral closure  Discussed recent referral to PT placed by Christina Brine, FNP o Patient reports PT has yet to begin o Successful outbound call placed to [name] who reports they have not received new PT orders  o Collaboration with referral coordinator to request follow up  Collaboration with RN Care Manager to update on patient goal progression  Scheduled follow up call over the next week  Patient Self Care Activities:   Self administers medications as prescribed  Attends all scheduled provider appointments  Calls pharmacy for medication refills  Performs ADL's independently  Performs IADL's independently  Calls provider office for new concerns or questions  Please see past updates related to this goal by clicking on the "Past Updates" button in the selected goal          Follow Up Plan: SW will follow up with patient by phone over the next week.   [name], BSW, CDP Social Worker, Certified Dementia Practitioner [place] [facility] [phone]

## 2020-02-28 NOTE — Patient Instructions (Signed)
Social Worker Visit Information  Goals we discussed today:  Goals Addressed              This Visit's Progress     Patient Stated   .  "I need to get a COVID vaccine but struggle with transportation" (pt-stated)        [place] (see longitudinal plan of care for additional care plan information)  Current Barriers:  . Limited access to caregiver whom provides transportation . Limited knowledge of local pharmacy offering in home vaccinations to qualifying Medicare recipients .  Limited ability to perform iADL's independently due to progression of MS  Social Work Clinical Goal(s):  [name] Over the next 5 days the patient will receive first dose of COVID 19 vaccine  Goal Met . Over the next 30 days the patient will receive second dose of COVID 19 vaccine  CCM SW Interventions: Completed 02/28/20 . Successful outbound call placed to the patient to assess progression of patient stated goal . Determined the patient did not receive second COVID 19 vaccine dose due to inability to answer the door quick enough and the pharmacist leaving the patient home o The patient reports she did contact [employer] who reported they would call at a later date to reschedule but have yet to do so . Discussed new [place] vaccine administration program for home bound patients . Advised the patient to contact [facility] over the next two days to confirm if the pharmacist plans to administer second vaccine . Encouraged the patient to contact SW directly if she in unable to receive vaccine from [facility] to allow SW to refer the patient to [facility] . Scheduled follow up over the next week  Patient Self Care Activities:  . Patient verbalizes understanding of plan to received COVID 19 vaccine in the home from [facility] . Self administers medications as prescribed . Attends all scheduled provider appointments . Calls provider office for new concerns or questions . Unable to perform  IADLs independently  Please see past updates related to this goal by clicking on the "Past Updates" button in the selected goal ;    .  "slow down the progression of my MS" (pt-stated)        CARE PLAN ENTRY (see longtitudinal plan of care for additional care plan information)  Current Barriers:  [name] Knowledge Deficits related to disease process and Self Health Management of MS  . Chronic Disease Management support and education needs related to Secondary Chronic Progressive MS, Gait disturbance, High Risk Medication, Constipation   Nurse Case Manager Clinical Goal(s):  [name] Over the next 90 days, patient will work with the [place] CM to address needs related to education and support to improve Self management of MS . Over the next 90 days, patient will have increased knowledge and understanding about alternate DME options to consider to help improve her gait/mobility, such as forearm crutches  CCM SW Interventions: Completed 02/28/20 . Successful outbound call placed to the patient to review goal progression . Performed chart review to note recent unsuccessful call attempts to schedule patient appointment with embedded PharmD . Determined the patient is not longer interested in PharmD referral as she has completely a recent tele health visit with Dr. Felecia Shelling and feels all questions surrounding MS have been answered o SW closed current PharmD referral and encouraged patient to contact care management team if she changes her mind at a later date o Collaboration with RN Care Manager to  inform of referral closure . Discussed recent referral to PT placed by [name], FNP o Patient reports PT has yet to begin o Successful outbound call placed to [place] [facility] who reports they have not received new PT orders  o Collaboration with referral coordinator to request follow up . Collaboration with RN Care Manager to update on patient goal progression . Scheduled follow up call over the next  week  Patient Self Care Activities:  . Self administers medications as prescribed . Attends all scheduled provider appointments . Calls pharmacy for medication refills . Performs ADL's independently . Performs IADL's independently . Calls provider office for new concerns or questions  Please see past updates related to this goal by clicking on the "Past Updates" button in the selected goal          Follow Up Plan: SW will follow up with patient by phone over the next week.   Daneen Schick, BSW, CDP Social Worker, Certified Dementia Practitioner [place] [facility] [place] [facility] [phone]

## 2020-02-29 ENCOUNTER — Ambulatory Visit: Payer: Self-pay

## 2020-02-29 ENCOUNTER — Telehealth: Payer: Self-pay

## 2020-02-29 ENCOUNTER — Telehealth: Payer: Self-pay | Admitting: Physician Assistant

## 2020-02-29 ENCOUNTER — Telehealth: Payer: Self-pay | Admitting: Adult Health

## 2020-02-29 DIAGNOSIS — G35 Multiple sclerosis: Secondary | ICD-10-CM

## 2020-02-29 DIAGNOSIS — R269 Unspecified abnormalities of gait and mobility: Secondary | ICD-10-CM

## 2020-02-29 NOTE — Patient Instructions (Signed)
Visit Information  Goals Addressed              This Visit's Progress     Patient Stated   .  "I need to get a COVID vaccine but struggle with transportation" (pt-stated)        [place] (see longitudinal plan of care for additional care plan information)  Current Barriers:  . Limited access to caregiver whom provides transportation . Limited knowledge of local pharmacy offering in home vaccinations to qualifying Medicare recipients .  Limited ability to perform iADL's independently due to progression of MS  Social Work Clinical Goal(s):  [name] Over the next 5 days the patient will receive first dose of COVID 19 vaccine  Goal Met . Over the next 30 days the patient will receive second dose of COVID 19 vaccine  CCM SW Interventions: Completed 02/29/20 . Received communication from the patient she is concerned she will not be given the appropriate vaccine from [place] due to a disagreement over the phone regarding her dog who barked during the last home vaccine administration o The patient requests SW assistance with alternative resource to administer . SW collaboration with [facility] Vaccine program o Referral placed and accepted by Angelena Form PA-C who reports she will contact the patient for scheduling . Communication with the patient to advise of intervention and plan  Patient Self Care Activities:  . Patient verbalizes understanding of plan to received COVID 19 vaccine in the home from [facility] . Self administers medications as prescribed . Attends all scheduled provider appointments . Calls provider office for new concerns or questions . Unable to perform IADLs independently  Please see past updates related to this goal by clicking on the "Past Updates" button in the selected goal ;      The care management team will reach out to the patient again over the next 14 days. Please contact me as needed.  Daneen Schick, BSW, CDP Social  Worker, Certified Dementia Practitioner [place] / [place] [facility] [phone]

## 2020-02-29 NOTE — Telephone Encounter (Signed)
Received message on COVID19 vaccine hotline from patient inquiring when they will receive a call to schedule vaccine.  Message forwarded to [name], RN.  Wilber Bihari, NP

## 2020-02-29 NOTE — Telephone Encounter (Signed)
-----   Message from Britt Bottom, MD sent at 02/24/2020 11:26 AM EDT ----- Follow-up in 5 to 6 months.  This visit should be live as last 2 visits will virtual and we need to check some blood work

## 2020-02-29 NOTE — Telephone Encounter (Signed)
I called pt. I scheduled her f/u for 08/02/2020 at 4pm. I advised pt that this will be in person and she will likely have labs drawn that day so be well hydrated. Pt verbalized understanding of new appt date and time.

## 2020-02-29 NOTE — Telephone Encounter (Addendum)
I connected by phone with [name] and/or patient's caregiver on 02/29/2020 at 5:28 PM to discuss the potential vaccination through our Homebound vaccination initiative.   Prevaccination Checklist for COVID-19 Vaccines  1.  Are you feeling sick today? no  2.  Have you ever received a dose of a COVID-19 vaccine?  yes      If yes, which one? Pfizer on May 20th  3.  Have you ever had an allergic reaction: (This would include a severe reaction [ e.g., anaphylaxis] that required treatment with epinephrine or EpiPen or that caused you to go to the hospital.  It would also include an allergic reaction that occurred within 4 hours that caused hives, swelling, or respiratory distress, including wheezing.) A.  A previous dose of COVID-19 vaccine. no  B.  A vaccine or injectable therapy that contains multiple components, one of which is a COVID-19 vaccine component, but it is not known which component elicited the immediate reaction. no  C.  Are you allergic to polyethylene glycol? no   4.  Have you ever had an allergic reaction to another vaccine (other than COVID-19 vaccine) or an injectable medication? (This would include a severe reaction [ e.g., anaphylaxis] that required treatment with epinephrine or EpiPen or that caused you to go to the hospital.  It would also include an allergic reaction that occurred within 4 hours that caused hives, swelling, or respiratory distress, including wheezing.)  no   5.  Have you ever had a severe allergic reaction (e.g., anaphylaxis) to something other than a component of the COVID-19 vaccine, or any vaccine or injectable medication?  This would include food, pet, venom, environmental, or oral medication allergies.  no   6.  Have you received any vaccine in the last 14 days? no   7.  Have you ever had a positive test for COVID-19 or has a doctor ever told you that you had COVID-19?  no   8.  Have you received passive antibody therapy (monoclonal antibodies or  convalescent serum) as a treatment for COVID-19? no   9.  Do you have a weakened immune system caused by something such as HIV infection or cancer or do you take immunosuppressive drugs or therapies?  no   10.  Do you have a bleeding disorder or are you taking a blood thinner? no   11.  Are you pregnant or breast-feeding? no   12.  Do you have dermal fillers? no   __________________   This patient is a 54 y.o. female that meets the FDA criteria to receive homebound vaccination. Patient or parent/caregiver understands they have the option to accept or refuse homebound vaccination.  Patient passed the pre-screening checklist and would like to proceed with homebound vaccination.  Based on questionnaire above, I recommend the patient be observed for 15 minutes.  There are no other household members/caregivers who are also interested in receiving the vaccine.   I will send a message to our scheduling team to get her set up for her second shot. I did confirm we are able to provide pfizer. Would need to get second vaccine by 7/1 to be in 6 week window.    Angelena Form 02/29/2020 5:28 PM

## 2020-02-29 NOTE — Chronic Care Management (AMB) (Signed)
  Care Management   Follow Up Note   02/29/2020 Name: [name] MRN: [id number] DOB: 1966/09/09  Referred by: Christina Brine, FNP Reason for referral : Care Coordination   [name] is a 54 y.o. year old female who is a primary care patient of [name], [place]. The care management team was consulted for assistance with care management and care coordination needs.    Review of patient status, including review of consultants reports, relevant laboratory and other test results, and collaboration with appropriate care team members and the patient's provider was performed as part of comprehensive patient evaluation and provision of chronic care management services.    SDOH (Social Determinants of Health) assessments performed: No See Care Plan activities for detailed interventions related to [facility])     Advanced Directives: See Care Plan and Vynca application for related entries.   Goals Addressed              This Visit's Progress     Patient Stated   .  "I need to get a COVID vaccine but struggle with transportation" (pt-stated)        [place] (see longitudinal plan of care for additional care plan information)  Current Barriers:  . Limited access to caregiver whom provides transportation . Limited knowledge of local pharmacy offering in home vaccinations to qualifying Medicare recipients .  Limited ability to perform iADL's independently due to progression of MS  Social Work Clinical Goal(s):  [name] Over the next 5 days the patient will receive first dose of COVID 19 vaccine  Goal Met . Over the next 30 days the patient will receive second dose of COVID 19 vaccine  CCM SW Interventions: Completed 02/29/20 . Received communication from the patient she is concerned she will not be given the appropriate vaccine from [place] due to a disagreement over the phone regarding her dog who barked during the last home vaccine administration o The patient requests  SW assistance with alternative resource to administer . SW collaboration with [facility] Vaccine program o Referral placed and accepted by Angelena Form PA-C who reports she will contact the patient for scheduling . Communication with the patient to advise of intervention and plan  Patient Self Care Activities:  . Patient verbalizes understanding of plan to received COVID 19 vaccine in the home from [facility] . Self administers medications as prescribed . Attends all scheduled provider appointments . Calls provider office for new concerns or questions . Unable to perform IADLs independently  Please see past updates related to this goal by clicking on the "Past Updates" button in the selected goal ;        The care management team will reach out to the patient again over the next 14 days. SW has advised the patient to contact SW directly if she does not receive a call from the homebound vaccine program.  Daneen Schick, BSW, CDP Social Worker, Certified Dementia Practitioner [place] [facility] [place] [facility] [phone]

## 2020-03-05 ENCOUNTER — Telehealth: Payer: Self-pay

## 2020-03-05 ENCOUNTER — Ambulatory Visit: Payer: Self-pay

## 2020-03-05 DIAGNOSIS — G35 Multiple sclerosis: Secondary | ICD-10-CM | POA: Diagnosis not present

## 2020-03-05 DIAGNOSIS — R269 Unspecified abnormalities of gait and mobility: Secondary | ICD-10-CM | POA: Diagnosis not present

## 2020-03-05 DIAGNOSIS — E559 Vitamin D deficiency, unspecified: Secondary | ICD-10-CM | POA: Diagnosis not present

## 2020-03-05 DIAGNOSIS — G259 Extrapyramidal and movement disorder, unspecified: Secondary | ICD-10-CM | POA: Diagnosis not present

## 2020-03-05 DIAGNOSIS — M21372 Foot drop, left foot: Secondary | ICD-10-CM | POA: Diagnosis not present

## 2020-03-05 DIAGNOSIS — N390 Urinary tract infection, site not specified: Secondary | ICD-10-CM | POA: Diagnosis not present

## 2020-03-05 DIAGNOSIS — Z9181 History of falling: Secondary | ICD-10-CM | POA: Diagnosis not present

## 2020-03-05 DIAGNOSIS — M6281 Muscle weakness (generalized): Secondary | ICD-10-CM

## 2020-03-05 DIAGNOSIS — R350 Frequency of micturition: Secondary | ICD-10-CM | POA: Diagnosis not present

## 2020-03-05 NOTE — Patient Instructions (Signed)
Visit Information  Goals Addressed              This Visit's Progress     Patient Stated   .  "slow down the progression of my MS" (pt-stated)   On track     [place] (see longtitudinal plan of care for additional care plan information)  Current Barriers:  [name] Knowledge Deficits related to disease process and Self Health Management of MS  . Chronic Disease Management support and education needs related to Secondary Chronic Progressive MS, Gait disturbance, High Risk Medication, Constipation   Nurse Case Manager Clinical Goal(s):  [name] Over the next 90 days, patient will work with the [place] CM to address needs related to education and support to improve Self management of MS . Over the next 90 days, patient will have increased knowledge and understanding about alternate DME options to consider to help improve her gait/mobility, such as forearm crutches  CCM SW Interventions: Completed 03/05/20 . Received communication from the patient on 03/04/20 stating : "Caroline More this is Briea Casso just wanted to let you know Anda Kraft from the physical therapist called today and she's coming out tomorrow at 2:00 for paperwork and information" . Performed chart review to note next scheduled SW outreach call planned for 03/07/20 to assess goal progression  Patient Self Care Activities:  . Self administers medications as prescribed . Attends all scheduled provider appointments . Calls pharmacy for medication refills . Performs ADL's independently . Performs IADL's independently . Calls provider office for new concerns or questions  Please see past updates related to this goal by clicking on the "Past Updates" button in the selected goal         SW will follow up with the patient on 6/23 as previously planned.  Daneen Schick, BSW, CDP Social Worker, Certified Dementia Practitioner [place] / [place] [facility] [phone]

## 2020-03-05 NOTE — Telephone Encounter (Signed)
Anda Kraft physical therapist with [facility] called requesting verbal orders for one time a week for 9 weeks. [phone]  I returned her call and left her a vm giving ok to verbal orders. YL,RMA

## 2020-03-05 NOTE — Chronic Care Management (AMB) (Signed)
  Care Management   Follow Up Note   03/05/2020 Name: [name] MRN: [id number] DOB: May 05, 1966  Referred by: [name], FNP Reason for referral : Care Coordination   [name] is a 54 y.o. year old female who is a primary care patient of [name], [place]. The care management team was consulted for assistance with care management and care coordination needs.    Review of patient status, including review of consultants reports, relevant laboratory and other test results, and collaboration with appropriate care team members and the patient's provider was performed as part of comprehensive patient evaluation and provision of chronic care management services.    Goals Addressed              This Visit's Progress     Patient Stated   .  "slow down the progression of my MS" (pt-stated)   On track     [place] (see longtitudinal plan of care for additional care plan information)  Current Barriers:  [name] Knowledge Deficits related to disease process and Self Health Management of MS  . Chronic Disease Management support and education needs related to Secondary Chronic Progressive MS, Gait disturbance, High Risk Medication, Constipation   Nurse Case Manager Clinical Goal(s):  [name] Over the next 90 days, patient will work with the [place] CM to address needs related to education and support to improve Self management of MS . Over the next 90 days, patient will have increased knowledge and understanding about alternate DME options to consider to help improve her gait/mobility, such as forearm crutches  CCM SW Interventions: Completed 03/05/20 . Received communication from the patient on 03/04/20 stating : "Caroline More this is Duru Solarz just wanted to let you know Anda Kraft from the physical therapist called today and she's coming out tomorrow at 2:00 for paperwork and information" . Performed chart review to note next scheduled SW outreach call planned for 03/07/20 to assess  goal progression  Patient Self Care Activities:  . Self administers medications as prescribed . Attends all scheduled provider appointments . Calls pharmacy for medication refills . Performs ADL's independently . Performs IADL's independently . Calls provider office for new concerns or questions  Please see past updates related to this goal by clicking on the "Past Updates" button in the selected goal          SW will follow up with the patient on 6/23 as previously planned.  Daneen Schick, BSW, CDP Social Worker, Certified Dementia Practitioner [place] [facility] [place] [facility] [phone]

## 2020-03-07 ENCOUNTER — Ambulatory Visit: Payer: Self-pay

## 2020-03-07 DIAGNOSIS — G35 Multiple sclerosis: Secondary | ICD-10-CM

## 2020-03-07 DIAGNOSIS — R269 Unspecified abnormalities of gait and mobility: Secondary | ICD-10-CM

## 2020-03-07 NOTE — Chronic Care Management (AMB) (Signed)
Care Management   Follow Up Note   03/07/2020 Name: [name] MRN: [id number] DOB: [birth year]  Referred by: [name], FNP Reason for referral : Care Coordination   [name] is a 54 y.o. year old female who is a primary care patient of [name], [place]. The care management team was consulted for assistance with care management and care coordination needs.    Review of patient status, including review of consultants reports, relevant laboratory and other test results, and collaboration with appropriate care team members and the patient's provider was performed as part of comprehensive patient evaluation and provision of chronic care management services.    SDOH (Social Determinants of Health) assessments performed: No See Care Plan activities for detailed interventions related to [facility])     Advanced Directives: See Care Plan and Vynca application for related entries.   Goals Addressed              This Visit's Progress     Patient Stated   .  "I need to get a COVID vaccine but struggle with transportation" (pt-stated)        [place] (see longitudinal plan of care for additional care plan information)  Current Barriers:  . Limited access to caregiver whom provides transportation . Limited knowledge of local pharmacy offering in home vaccinations to qualifying Medicare recipients .  Limited ability to perform iADL's independently due to progression of MS  Social Work Clinical Goal(s):  [name] Over the next 5 days the patient will receive first dose of COVID 19 vaccine  Goal Met . Over the next 30 days the patient will receive second dose of COVID 19 vaccine Goal not met due to scheduling barriers . New 03/07/20 Patient will receive second [place] dose by 03/15/20 to remain within 6 week vaccine window  CCM SW Interventions: Completed 03/07/20 . Successful outbound call placed to the patient to determine she has yet to be contacted for scheduling in home  COVID vaccine . Chart review performed to note patient screened on 6/16; it is indicated patient was to be contacted for scheduling prior to 7/1 . Advised the patient SW would try to outreach Homebound Vaccine Program to determine when the patient will receive in home vaccine . Collaboration with Angelena Form PA-C regarding patient concern surrounding vaccine scheduling o [name] reports "I have reached out to Odette Horns who said she would be called today" . Scheduled follow up call over the next two days to assess goal progression . Collaboration with RN Care Manager regarding current barriers and goal progression  Patient Self Care Activities:  . Patient verbalizes understanding of plan to received COVID 19 vaccine in the home from [facility] . Self administers medications as prescribed . Attends all scheduled provider appointments . Calls provider office for new concerns or questions . Unable to perform IADLs independently  Please see past updates related to this goal by clicking on the "Past Updates" button in the selected goal ;    .  "slow down the progression of my MS" (pt-stated)        CARE PLAN ENTRY (see longtitudinal plan of care for additional care plan information)  Current Barriers:  [name] Knowledge Deficits related to disease process and Self Health Management of MS  . Chronic Disease Management support and education needs related to Secondary Chronic Progressive MS, Gait disturbance, High Risk Medication, Constipation   Nurse Case Manager Clinical Goal(s):  [name] Over the next 90 days,  patient will work with the [place] CM to address needs related to education and support to improve Self management of MS . Over the next 90 days, patient will have increased knowledge and understanding about alternate DME options to consider to help improve her gait/mobility, such as forearm crutches  CCM SW Interventions: Completed 03/07/20 . Successful outbound call placed to the  patient to confirm active with [facility] PT . Chart reviewed to note verbal orders requested for 1 visit/week x 9 weeks; authorization provided . Collaboration with RN Care Manager to update on patient goal progression  Patient Self Care Activities:  . Self administers medications as prescribed . Attends all scheduled provider appointments . Calls pharmacy for medication refills . Performs ADL's independently . Performs IADL's independently . Calls provider office for new concerns or questions  Please see past updates related to this goal by clicking on the "Past Updates" button in the selected goal          The care management team will reach out to the patient again over the next 2 days.   Daneen Schick, BSW, CDP Social Worker, Certified Dementia Practitioner [place] / [place] [facility] [phone]

## 2020-03-07 NOTE — Patient Instructions (Signed)
Visit Information  Goals Addressed              This Visit's Progress     Patient Stated   .  "I need to get a COVID vaccine but struggle with transportation" (pt-stated)        [place] (see longitudinal plan of care for additional care plan information)  Current Barriers:  . Limited access to caregiver whom provides transportation . Limited knowledge of local pharmacy offering in home vaccinations to qualifying Medicare recipients .  Limited ability to perform iADL's independently due to progression of MS  Social Work Clinical Goal(s):  [name] Over the next 5 days the patient will receive first dose of COVID 19 vaccine  Goal Met . Over the next 30 days the patient will receive second dose of COVID 19 vaccine Goal not met due to scheduling barriers . New 03/07/20 Patient will receive second [place] dose by 03/15/20 to remain within 6 week vaccine window  CCM SW Interventions: Completed 03/07/20 . Successful outbound call placed to the patient to determine she has yet to be contacted for scheduling in home COVID vaccine . Chart review performed to note patient screened on 6/16; it is indicated patient was to be contacted for scheduling prior to 7/1 . Advised the patient SW would try to outreach Homebound Vaccine Program to determine when the patient will receive in home vaccine . Collaboration with Angelena Form PA-C regarding patient concern surrounding vaccine scheduling o [name] reports "I have reached out to Odette Horns who said she would be called today" . Scheduled follow up call over the next two days to assess goal progression . Collaboration with RN Care Manager regarding current barriers and goal progression  Patient Self Care Activities:  . Patient verbalizes understanding of plan to received COVID 19 vaccine in the home from [facility] . Self administers medications as prescribed . Attends all scheduled provider appointments . Calls provider office for  new concerns or questions . Unable to perform IADLs independently  Please see past updates related to this goal by clicking on the "Past Updates" button in the selected goal ;    .  "slow down the progression of my MS" (pt-stated)        CARE PLAN ENTRY (see longtitudinal plan of care for additional care plan information)  Current Barriers:  [name] Knowledge Deficits related to disease process and Self Health Management of MS  . Chronic Disease Management support and education needs related to Secondary Chronic Progressive MS, Gait disturbance, High Risk Medication, Constipation   Nurse Case Manager Clinical Goal(s):  [name] Over the next 90 days, patient will work with the [place] CM to address needs related to education and support to improve Self management of MS . Over the next 90 days, patient will have increased knowledge and understanding about alternate DME options to consider to help improve her gait/mobility, such as forearm crutches  CCM SW Interventions: Completed 03/07/20 . Successful outbound call placed to the patient to confirm active with [facility] . Chart reviewed to note verbal orders requested for 1 visit/week x 9 weeks; authorization provided . Collaboration with RN Care Manager to update on patient goal progression  Patient Self Care Activities:  . Self administers medications as prescribed . Attends all scheduled provider appointments . Calls pharmacy for medication refills . Performs ADL's independently . Performs IADL's independently . Calls provider office for new concerns or questions  Please see past updates  related to this goal by clicking on the "Past Updates" button in the selected goal         The care management team will reach out to the patient again over the next 2 days.   Daneen Schick, BSW, CDP Social Worker, Certified Dementia Practitioner [place] [facility] [place] [facility] [phone]

## 2020-03-09 ENCOUNTER — Ambulatory Visit: Payer: Self-pay

## 2020-03-09 DIAGNOSIS — G35 Multiple sclerosis: Secondary | ICD-10-CM

## 2020-03-09 DIAGNOSIS — R269 Unspecified abnormalities of gait and mobility: Secondary | ICD-10-CM

## 2020-03-09 NOTE — Chronic Care Management (AMB) (Signed)
Chronic Care Management    Social Work Follow Up Note  03/09/2020 Name: [name] MRN: [id number] DOB: 1966-09-02  [name] is a 54 y.o. year old female who is a primary care patient of [name], [place]. The CCM team was consulted for assistance with care coordination.   Review of patient status, including review of consultants reports, other relevant assessments, and collaboration with appropriate care team members and the patient's provider was performed as part of comprehensive patient evaluation and provision of chronic care management services.      Outpatient Encounter Medications as of 03/09/2020  Medication Sig  . CVS B12 QUICK DISSOLVE 500 MCG LOZG TAKE 2 TABS BY MOUTH DAILY  . CVS D3 50 MCG (2000 UT) CAPS TAKE 1 CAPSULE BY MOUTH DAILY OR AS DIRECTED  . Multiple Vitamin (MULTIVITAMIN ADULT PO) Take 1 Dose by mouth daily.  [name] OVER THE COUNTER MEDICATION Take 1 Dose by mouth daily. [place]-  Immune supplement  . TECFIDERA 240 MG CPDR Take 1 capsule by mouth twice daily   No facility-administered encounter medications on file as of 03/09/2020.     Goals Addressed              This Visit's Progress     Patient Stated   .  "I need to get a COVID vaccine but struggle with transportation" (pt-stated)   Not on track     [place] (see longitudinal plan of care for additional care plan information)  Current Barriers:  . Limited access to caregiver whom provides transportation . Limited knowledge of local pharmacy offering in home vaccinations to qualifying Medicare recipients .  Limited ability to perform iADL's independently due to progression of MS  Social Work Clinical Goal(s):  [name] Over the next 5 days the patient will receive first dose of COVID 19 vaccine  Goal Met . Over the next 30 days the patient will receive second dose of COVID 19 vaccine Goal not met due to scheduling barriers . New 03/07/20 Patient will receive second [place] dose by 03/15/20  to remain within 6 week vaccine window  CCM SW Interventions: Completed 03/09/20 . Successful outbound call placed to the patient to assess goal progression . Determined the patient has yet to be contacted by scheduling team regarding in home vaccine . Advised the patient SW was informed the patient would be contacted day of 6/23 . SW placed call to [facility] Vaccine line [phone]) detailed voice message left requesting outreach to the patient to schedule second dose prior to 7/1 deadline for 6 week window. SW info left for call back questions . Collaboration with Angelena Form PA-C to request assistance in providing SW with contact information to scheduling team . SW will follow up with the patient over the next 3 days 1:30pm . Collaboration with Angelena Form who requested SW contact [name] regarding scheduling . Email correspondence sent to [name] requesting patient follow up  Completed 03/07/20 . Successful outbound call placed to the patient to determine she has yet to be contacted for scheduling in home COVID vaccine . Chart review performed to note patient screened on 6/16; it is indicated patient was to be contacted for scheduling prior to 7/1 . Advised the patient SW would try to outreach Homebound Vaccine Program to determine when the patient will receive in home vaccine . Collaboration with Angelena Form PA-C regarding patient concern surrounding vaccine scheduling o [name] reports "I have reached out to [employer]  who said she would be called today" . Scheduled follow up call over the next two days to assess goal progression . Collaboration with RN Care Manager regarding current barriers and goal progression  Completed 02/29/20 . Received communication from the patient she is concerned she will not be given the appropriate vaccine from [place] due to a disagreement over the phone regarding her dog who barked during the last home vaccine  administration o The patient requests SW assistance with alternative resource to administer . SW collaboration with [facility] Vaccine program o Referral placed and accepted by Angelena Form PA-C who reports she will contact the patient for scheduling . Communication with the patient to advise of intervention and plan  Completed 02/28/20 . Successful outbound call placed to the patient to assess progression of patient stated goal . Determined the patient did not receive second COVID 19 vaccine dose due to inability to answer the door quick enough and the pharmacist leaving the patient home o The patient reports she did contact [employer] who reported they would call at a later date to reschedule but have yet to do so . Discussed new [place] vaccine administration program for home bound patients . Advised the patient to contact [place] over the next two days to confirm if the pharmacist plans to administer second vaccine . Encouraged the patient to contact SW directly if she in unable to receive vaccine from [place] to allow SW to refer the patient to [facility] . Scheduled follow up over the next week  Completed 02/06/20 . Inter-disciplinary care team collaboration (see longitudinal plan of care) . Successful outbound call placed to the patient to assess goal progression . Confirmed the patient received first dose of COVID 19 vaccine on May 20th in home via [employer] o The patient reports she was given a Pfizer vaccine in her right arm o The patient notes arm soreness to the injection site o Next planned vaccine scheduled for June 10 . Encouraged the patient to contact SW as needed . Scheduled outbound call over the next month to confirm goal met  Patient Self Care Activities:  . Patient verbalizes understanding of plan to received COVID 19 vaccine in the home from [facility] . Self administers medications as prescribed . Attends all scheduled  provider appointments . Calls provider office for new concerns or questions . Unable to perform IADLs independently  Please see past updates related to this goal by clicking on the "Past Updates" button in the selected goal ;        Follow Up Plan: SW will follow up with patient by phone over the next 3 days.   Daneen Schick, BSW, CDP Social Worker, Certified Dementia Practitioner [place] [facility] [place] [facility] [phone]

## 2020-03-09 NOTE — Patient Instructions (Signed)
Social Worker Visit Information  Goals we discussed today:  Goals Addressed              This Visit's Progress     Patient Stated   .  "I need to get a COVID vaccine but struggle with transportation" (pt-stated)   Not on track     [place] (see longitudinal plan of care for additional care plan information)  Current Barriers:  . Limited access to caregiver whom provides transportation . Limited knowledge of local pharmacy offering in home vaccinations to qualifying Medicare recipients .  Limited ability to perform iADL's independently due to progression of MS  Social Work Clinical Goal(s):  [name] Over the next 5 days the patient will receive first dose of COVID 19 vaccine  Goal Met . Over the next 30 days the patient will receive second dose of COVID 19 vaccine Goal not met due to scheduling barriers . New 03/07/20 Patient will receive second [place] dose by 03/15/20 to remain within 6 week vaccine window  CCM SW Interventions: Completed 03/09/20 . Successful outbound call placed to the patient to assess goal progression . Determined the patient has yet to be contacted by scheduling team regarding in home vaccine . Advised the patient SW was informed the patient would be contacted day of 6/23 . SW placed call to [facility] Vaccine line [phone]) detailed voice message left requesting outreach to the patient to schedule second dose prior to 7/1 deadline for 6 week window. SW info left for call back questions . Collaboration with Angelena Form PA-C to request assistance in providing SW with contact information to scheduling team . SW will follow up with the patient over the next 3 days  Completed 03/07/20 . Successful outbound call placed to the patient to determine she has yet to be contacted for scheduling in home COVID vaccine . Chart review performed to note patient screened on 6/16; it is indicated patient was to be contacted for scheduling prior to 7/1 . Advised the patient  SW would try to outreach Homebound Vaccine Program to determine when the patient will receive in home vaccine . Collaboration with Angelena Form PA-C regarding patient concern surrounding vaccine scheduling o [name] reports "I have reached out to Odette Horns who said she would be called today" . Scheduled follow up call over the next two days to assess goal progression . Collaboration with RN Care Manager regarding current barriers and goal progression  Completed 02/29/20 . Received communication from the patient she is concerned she will not be given the appropriate vaccine from [place] due to a disagreement over the phone regarding her dog who barked during the last home vaccine administration o The patient requests SW assistance with alternative resource to administer . SW collaboration with [facility] Vaccine program o Referral placed and accepted by Angelena Form PA-C who reports she will contact the patient for scheduling . Communication with the patient to advise of intervention and plan  Completed 02/28/20 . Successful outbound call placed to the patient to assess progression of patient stated goal . Determined the patient did not receive second COVID 19 vaccine dose due to inability to answer the door quick enough and the pharmacist leaving the patient home o The patient reports she did contact [employer] who reported they would call at a later date to reschedule but have yet to do so . Discussed new [place] vaccine administration program for home bound patients . Advised the patient to  contact [place] over the next two days to confirm if the pharmacist plans to administer second vaccine . Encouraged the patient to contact SW directly if she in unable to receive vaccine from [place] to allow SW to refer the patient to [facility] . Scheduled follow up over the next week  Completed 02/06/20 . Inter-disciplinary care team collaboration  (see longitudinal plan of care) . Successful outbound call placed to the patient to assess goal progression . Confirmed the patient received first dose of COVID 19 vaccine on May 20th in home via [employer] o The patient reports she was given a Pfizer vaccine in her right arm o The patient notes arm soreness to the injection site o Next planned vaccine scheduled for June 10 . Encouraged the patient to contact SW as needed . Scheduled outbound call over the next month to confirm goal met  Patient Self Care Activities:  . Patient verbalizes understanding of plan to received COVID 19 vaccine in the home from [facility] . Self administers medications as prescribed . Attends all scheduled provider appointments . Calls provider office for new concerns or questions . Unable to perform IADLs independently  Please see past updates related to this goal by clicking on the "Past Updates" button in the selected goal ;        Follow Up Plan: SW will follow up with patient by phone over the next three days.   Daneen Schick, BSW, CDP Social Worker, Certified Dementia Practitioner [place] [facility] [place] [facility] [phone]

## 2020-03-09 NOTE — Chronic Care Management (AMB) (Signed)
Chronic Care Management    Social Work Follow Up Note  03/09/2020 Name: [name] MRN: [id number] DOB: 07-30-1966  [name] is a 54 y.o. year old female who is a primary care patient of Arnette Felts, FNP. The CCM team was consulted for assistance with care coordination.   Review of patient status, including review of consultants reports, other relevant assessments, and collaboration with appropriate care team members and the patient's provider was performed as part of comprehensive patient evaluation and provision of chronic care management services.    SDOH (Social Determinants of Health) assessments performed: No    Outpatient Encounter Medications as of 03/09/2020  Medication Sig  . CVS B12 QUICK DISSOLVE 500 MCG LOZG TAKE 2 TABS BY MOUTH DAILY  . CVS D3 50 MCG (2000 UT) CAPS TAKE 1 CAPSULE BY MOUTH DAILY OR AS DIRECTED  . Multiple Vitamin (MULTIVITAMIN ADULT PO) Take 1 Dose by mouth daily.  [name] OVER THE COUNTER MEDICATION Take 1 Dose by mouth daily. [place]-  Immune supplement  . TECFIDERA 240 MG CPDR Take 1 capsule by mouth twice daily   No facility-administered encounter medications on file as of 03/09/2020.     Goals Addressed              This Visit's Progress     Patient Stated   .  "I need to get a COVID vaccine but struggle with transportation" (pt-stated)   Not on track     CARE PLAN ENTRY (see longitudinal plan of care for additional care plan information)  Current Barriers:  . Limited access to caregiver whom provides transportation . Limited knowledge of local pharmacy offering in home vaccinations to qualifying Medicare recipients .  Limited ability to perform iADL's independently due to progression of MS  Social Work Clinical Goal(s):  [name] Over the next 5 days the patient will receive first dose of COVID 19 vaccine  Goal Met . Over the next 30 days the patient will receive second dose of COVID 19 vaccine Goal not met due to scheduling  barriers . New 03/07/20 Patient will receive second Pfizer dose by 03/15/20 to remain within 6 week vaccine window  CCM SW Interventions: Completed 03/09/20 . Successful outbound call placed to the patient to assess goal progression . Determined the patient has yet to be contacted by scheduling team regarding in home vaccine . Advised the patient SW was informed the patient would be contacted day of 6/23 . SW placed call to [facility] Vaccine line [phone]) detailed voice message left requesting outreach to the patient to schedule second dose prior to 7/1 deadline for 6 week window. SW info left for call back questions . Collaboration with Cline Crock PA-C to request assistance in providing SW with contact information to the scheduling team  . SW will follow up with the patient over the next 3 days  Completed 03/07/20 . Successful outbound call placed to the patient to determine she has yet to be contacted for scheduling in home COVID vaccine . Chart review performed to note patient screened on 6/16; it is indicated patient was to be contacted for scheduling prior to 7/1 . Advised the patient SW would try to outreach Homebound Vaccine Program to determine when the patient will receive in home vaccine . Collaboration with Cline Crock PA-C regarding patient concern surrounding vaccine scheduling o Mrs [name] reports "I have reached out to [name] who said she would be called today" . Scheduled follow up call over the  next two days to assess goal progression . Collaboration with RN Care Manager regarding current barriers and goal progression  Completed 02/29/20 . Received communication from the patient she is concerned she will not be given the appropriate vaccine from [place] due to a disagreement over the phone regarding her dog who barked during the last home vaccine administration o The patient requests SW assistance with alternative resource to administer . SW collaboration  with [facility] Vaccine program o Referral placed and accepted by Angelena Form PA-C who reports she will contact the patient for scheduling . Communication with the patient to advise of intervention and plan  Completed 02/28/20 . Successful outbound call placed to the patient to assess progression of patient stated goal . Determined the patient did not receive second COVID 19 vaccine dose due to inability to answer the door quick enough and the pharmacist leaving the patient home o The patient reports she did contact [employer] who reported they would call at a later date to reschedule but have yet to do so . Discussed new [place] vaccine administration program for home bound patients . Advised the patient to contact [place] over the next two days to confirm if the pharmacist plans to administer second vaccine . Encouraged the patient to contact SW directly if she in unable to receive vaccine from [place] to allow SW to refer the patient to [facility] . Scheduled follow up over the next week  Completed 02/06/20 . Inter-disciplinary care team collaboration (see longitudinal plan of care) . Successful outbound call placed to the patient to assess goal progression . Confirmed the patient received first dose of COVID 19 vaccine on May 20th in home via [employer] o The patient reports she was given a Pfizer vaccine in her right arm o The patient notes arm soreness to the injection site o Next planned vaccine scheduled for June 10 . Encouraged the patient to contact SW as needed . Scheduled outbound call over the next month to confirm goal met  Patient Self Care Activities:  . Patient verbalizes understanding of plan to received COVID 19 vaccine in the home from [facility] . Self administers medications as prescribed . Attends all scheduled provider appointments . Calls provider office for new concerns or questions . Unable to perform IADLs  independently  Please see past updates related to this goal by clicking on the "Past Updates" button in the selected goal ;        Follow Up Plan: SW will follow up with patient by phone over the next 3 days.   Daneen Schick, BSW, CDP Social Worker, Certified Dementia Practitioner [place] [facility] [place] [facility] [phone]  Total time spent performing care coordination and/or care management activities with the patient by phone or face to face = 15 minutes.

## 2020-03-12 ENCOUNTER — Ambulatory Visit: Payer: Self-pay

## 2020-03-12 DIAGNOSIS — G35 Multiple sclerosis: Secondary | ICD-10-CM

## 2020-03-12 DIAGNOSIS — R269 Unspecified abnormalities of gait and mobility: Secondary | ICD-10-CM

## 2020-03-12 NOTE — Chronic Care Management (AMB) (Signed)
  Chronic Care Management    Social Work Follow Up Note  03/12/2020 Name: [name] MRN: [id number] DOB: Mar 12, 1966  [name] is a 54 y.o. year old female who is a primary care patient of [name], [place]. The CCM team was consulted for assistance with care coordination.   Review of patient status, including review of consultants reports, other relevant assessments, and collaboration with appropriate care team members and the patient's provider was performed as part of comprehensive patient evaluation and provision of chronic care management services.    SDOH (Social Determinants of Health) assessments performed: No    Outpatient Encounter Medications as of 03/12/2020  Medication Sig  . CVS B12 QUICK DISSOLVE 500 MCG LOZG TAKE 2 TABS BY MOUTH DAILY  . CVS D3 50 MCG (2000 UT) CAPS TAKE 1 CAPSULE BY MOUTH DAILY OR AS DIRECTED  . Multiple Vitamin (MULTIVITAMIN ADULT PO) Take 1 Dose by mouth daily.  [name] OVER THE COUNTER MEDICATION Take 1 Dose by mouth daily. [place]-  Immune supplement  . TECFIDERA 240 MG CPDR Take 1 capsule by mouth twice daily   No facility-administered encounter medications on file as of 03/12/2020.     Goals Addressed              This Visit's Progress     Patient Stated   .  COMPLETED: "I need to get a COVID vaccine but struggle with transportation" (pt-stated)        [place] (see longitudinal plan of care for additional care plan information)  Current Barriers:  . Limited access to caregiver whom provides transportation . Limited knowledge of local pharmacy offering in home vaccinations to qualifying Medicare recipients .  Limited ability to perform iADL's independently due to progression of MS  Social Work Clinical Goal(s):  [name] Over the next 5 days the patient will receive first dose of COVID 19 vaccine  Goal Met . Over the next 30 days the patient will receive second dose of COVID 19 vaccine Goal not met due to scheduling  barriers . New 03/07/20 Patient will receive second [place] dose by 03/15/20 to remain within 6 week vaccine window  CCM SW Interventions: Completed 03/12/20 . Successful outbound call placed to the patient . Confirmed the patient has in home [place] vaccination scheduled for 6/29 through homebound covid clinic offerred by [facility] . Advised patient to contact SW as needed with ongoing care coordination needs  Patient Self Care Activities:  . Patient verbalizes understanding of plan to received COVID 19 vaccine in the home from [facility] . Self administers medications as prescribed . Attends all scheduled provider appointments . Calls provider office for new concerns or questions . Unable to perform IADLs independently  Please see past updates related to this goal by clicking on the "Past Updates" button in the selected goal ;        Follow Up Plan: No SW follow up planned at this time. SW encouraged the patient to contact SW directly as needed with future care coordination needs. Next RN Care Manager outreach call planned for 03/21/20.   Daneen Schick, BSW, CDP Social Worker, Certified Dementia Practitioner [place] [facility] [place] [facility] [phone]  Total time spent performing care coordination and/or care management activities with the patient by phone or face to face = 5 minutes.

## 2020-03-12 NOTE — Patient Instructions (Signed)
Visit Information  Goals Addressed              This Visit's Progress     Patient Stated   .  COMPLETED: "I need to get a COVID vaccine but struggle with transportation" (pt-stated)        [place] (see longitudinal plan of care for additional care plan information)  Current Barriers:  . Limited access to caregiver whom provides transportation . Limited knowledge of local pharmacy offering in home vaccinations to qualifying Medicare recipients .  Limited ability to perform iADL's independently due to progression of MS  Social Work Clinical Goal(s):  [name] Over the next 5 days the patient will receive first dose of COVID 19 vaccine  Goal Met . Over the next 30 days the patient will receive second dose of COVID 19 vaccine Goal not met due to scheduling barriers . New 03/07/20 Patient will receive second [place] dose by 03/15/20 to remain within 6 week vaccine window  CCM SW Interventions: Completed 03/12/20 . Successful outbound call placed to the patient . Confirmed the patient has in home [place] vaccination scheduled for 6/29 through homebound covid clinic offerred by [facility] . Advised patient to contact SW as needed with ongoing care coordination needs  Patient Self Care Activities:  . Patient verbalizes understanding of plan to received COVID 19 vaccine in the home from [facility] . Self administers medications as prescribed . Attends all scheduled provider appointments . Calls provider office for new concerns or questions . Unable to perform IADLs independently  Please see past updates related to this goal by clicking on the "Past Updates" button in the selected goal ;       [place] scheduled outreach for 03/21/20  Daneen Schick, [place], CDP Social Worker, Certified Dementia Practitioner [place] [facility] [place] [facility] [phone]

## 2020-03-13 ENCOUNTER — Ambulatory Visit: Payer: Medicare Other | Attending: Critical Care Medicine

## 2020-03-13 DIAGNOSIS — G35 Multiple sclerosis: Secondary | ICD-10-CM | POA: Diagnosis not present

## 2020-03-13 DIAGNOSIS — E559 Vitamin D deficiency, unspecified: Secondary | ICD-10-CM | POA: Diagnosis not present

## 2020-03-13 DIAGNOSIS — N390 Urinary tract infection, site not specified: Secondary | ICD-10-CM | POA: Diagnosis not present

## 2020-03-13 DIAGNOSIS — M21372 Foot drop, left foot: Secondary | ICD-10-CM | POA: Diagnosis not present

## 2020-03-13 DIAGNOSIS — G259 Extrapyramidal and movement disorder, unspecified: Secondary | ICD-10-CM | POA: Diagnosis not present

## 2020-03-13 DIAGNOSIS — Z23 Encounter for immunization: Secondary | ICD-10-CM

## 2020-03-13 DIAGNOSIS — R350 Frequency of micturition: Secondary | ICD-10-CM | POA: Diagnosis not present

## 2020-03-13 NOTE — Progress Notes (Signed)
   Covid-19 Vaccination Clinic  Name:  [name]    MRN: [id number] DOB: July 29, 1966  03/13/2020  [name] was observed post Covid-19 immunization for 15 minutes without incident. She was provided with Vaccine Information Sheet and instruction to access the V-Safe system.   [name] was instructed to call 911 with any severe reactions post vaccine: [name] Difficulty breathing  . Swelling of face and throat  . A fast heartbeat  . A bad rash all over body  . Dizziness and weakness   Immunizations Administered    Name Date Dose VIS Date Route   Pfizer COVID-19 Vaccine 03/13/2020  2:55 PM 0.3 mL 11/09/2018 Intramuscular   Manufacturer: [place]   Lot: [id number]   [place]: [id number]

## 2020-03-20 DIAGNOSIS — N390 Urinary tract infection, site not specified: Secondary | ICD-10-CM | POA: Diagnosis not present

## 2020-03-20 DIAGNOSIS — G259 Extrapyramidal and movement disorder, unspecified: Secondary | ICD-10-CM | POA: Diagnosis not present

## 2020-03-20 DIAGNOSIS — M21372 Foot drop, left foot: Secondary | ICD-10-CM | POA: Diagnosis not present

## 2020-03-20 DIAGNOSIS — E559 Vitamin D deficiency, unspecified: Secondary | ICD-10-CM | POA: Diagnosis not present

## 2020-03-20 DIAGNOSIS — R350 Frequency of micturition: Secondary | ICD-10-CM | POA: Diagnosis not present

## 2020-03-20 DIAGNOSIS — G35 Multiple sclerosis: Secondary | ICD-10-CM | POA: Diagnosis not present

## 2020-03-21 ENCOUNTER — Telehealth: Payer: Self-pay

## 2020-03-25 DIAGNOSIS — R350 Frequency of micturition: Secondary | ICD-10-CM | POA: Diagnosis not present

## 2020-03-25 DIAGNOSIS — N390 Urinary tract infection, site not specified: Secondary | ICD-10-CM | POA: Diagnosis not present

## 2020-03-25 DIAGNOSIS — G35 Multiple sclerosis: Secondary | ICD-10-CM | POA: Diagnosis not present

## 2020-03-25 DIAGNOSIS — G259 Extrapyramidal and movement disorder, unspecified: Secondary | ICD-10-CM | POA: Diagnosis not present

## 2020-03-25 DIAGNOSIS — M21372 Foot drop, left foot: Secondary | ICD-10-CM | POA: Diagnosis not present

## 2020-03-25 DIAGNOSIS — E559 Vitamin D deficiency, unspecified: Secondary | ICD-10-CM | POA: Diagnosis not present

## 2020-03-25 LAB — TSH: TSH: 4.53 (ref 0.41–5.90)

## 2020-03-26 DIAGNOSIS — G35 Multiple sclerosis: Secondary | ICD-10-CM | POA: Diagnosis not present

## 2020-03-27 DIAGNOSIS — G259 Extrapyramidal and movement disorder, unspecified: Secondary | ICD-10-CM | POA: Diagnosis not present

## 2020-03-27 DIAGNOSIS — R350 Frequency of micturition: Secondary | ICD-10-CM | POA: Diagnosis not present

## 2020-03-27 DIAGNOSIS — G35 Multiple sclerosis: Secondary | ICD-10-CM | POA: Diagnosis not present

## 2020-03-27 DIAGNOSIS — E559 Vitamin D deficiency, unspecified: Secondary | ICD-10-CM | POA: Diagnosis not present

## 2020-03-27 DIAGNOSIS — N390 Urinary tract infection, site not specified: Secondary | ICD-10-CM | POA: Diagnosis not present

## 2020-03-27 DIAGNOSIS — M21372 Foot drop, left foot: Secondary | ICD-10-CM | POA: Diagnosis not present

## 2020-03-28 ENCOUNTER — Encounter: Payer: Self-pay | Admitting: Nurse Practitioner

## 2020-04-02 DIAGNOSIS — G259 Extrapyramidal and movement disorder, unspecified: Secondary | ICD-10-CM | POA: Diagnosis not present

## 2020-04-02 DIAGNOSIS — G35 Multiple sclerosis: Secondary | ICD-10-CM | POA: Diagnosis not present

## 2020-04-02 DIAGNOSIS — R350 Frequency of micturition: Secondary | ICD-10-CM | POA: Diagnosis not present

## 2020-04-02 DIAGNOSIS — E559 Vitamin D deficiency, unspecified: Secondary | ICD-10-CM | POA: Diagnosis not present

## 2020-04-02 DIAGNOSIS — N390 Urinary tract infection, site not specified: Secondary | ICD-10-CM | POA: Diagnosis not present

## 2020-04-02 DIAGNOSIS — M21372 Foot drop, left foot: Secondary | ICD-10-CM | POA: Diagnosis not present

## 2020-04-03 ENCOUNTER — Telehealth: Payer: Self-pay | Admitting: Neurology

## 2020-04-03 NOTE — Telephone Encounter (Signed)
Called pt. Relayed Dr. Garth Bigness message. She verbalized understanding. She requesting order be mailed, I placed in mail for her.

## 2020-04-03 NOTE — Telephone Encounter (Signed)
We can write an order for 1.  She would likely need to have a face-to-face evaluation for a mobility exam.

## 2020-04-03 NOTE — Telephone Encounter (Signed)
Pt would like a call to know if Dr Felecia Shelling will write an order for a power wheel chair, please call.

## 2020-04-04 ENCOUNTER — Other Ambulatory Visit: Payer: Self-pay

## 2020-04-04 DIAGNOSIS — Z1211 Encounter for screening for malignant neoplasm of colon: Secondary | ICD-10-CM

## 2020-04-11 DIAGNOSIS — R350 Frequency of micturition: Secondary | ICD-10-CM | POA: Diagnosis not present

## 2020-04-11 DIAGNOSIS — E559 Vitamin D deficiency, unspecified: Secondary | ICD-10-CM | POA: Diagnosis not present

## 2020-04-11 DIAGNOSIS — G35 Multiple sclerosis: Secondary | ICD-10-CM | POA: Diagnosis not present

## 2020-04-11 DIAGNOSIS — N390 Urinary tract infection, site not specified: Secondary | ICD-10-CM | POA: Diagnosis not present

## 2020-04-11 DIAGNOSIS — M21372 Foot drop, left foot: Secondary | ICD-10-CM | POA: Diagnosis not present

## 2020-04-11 DIAGNOSIS — G259 Extrapyramidal and movement disorder, unspecified: Secondary | ICD-10-CM | POA: Diagnosis not present

## 2020-04-18 DIAGNOSIS — R350 Frequency of micturition: Secondary | ICD-10-CM | POA: Diagnosis not present

## 2020-04-18 DIAGNOSIS — N390 Urinary tract infection, site not specified: Secondary | ICD-10-CM | POA: Diagnosis not present

## 2020-04-18 DIAGNOSIS — E559 Vitamin D deficiency, unspecified: Secondary | ICD-10-CM | POA: Diagnosis not present

## 2020-04-18 DIAGNOSIS — M21372 Foot drop, left foot: Secondary | ICD-10-CM | POA: Diagnosis not present

## 2020-04-18 DIAGNOSIS — G259 Extrapyramidal and movement disorder, unspecified: Secondary | ICD-10-CM | POA: Diagnosis not present

## 2020-04-18 DIAGNOSIS — G35 Multiple sclerosis: Secondary | ICD-10-CM | POA: Diagnosis not present

## 2020-04-30 DIAGNOSIS — G259 Extrapyramidal and movement disorder, unspecified: Secondary | ICD-10-CM | POA: Diagnosis not present

## 2020-04-30 DIAGNOSIS — R350 Frequency of micturition: Secondary | ICD-10-CM | POA: Diagnosis not present

## 2020-04-30 DIAGNOSIS — E559 Vitamin D deficiency, unspecified: Secondary | ICD-10-CM | POA: Diagnosis not present

## 2020-04-30 DIAGNOSIS — N390 Urinary tract infection, site not specified: Secondary | ICD-10-CM | POA: Diagnosis not present

## 2020-04-30 DIAGNOSIS — G35 Multiple sclerosis: Secondary | ICD-10-CM | POA: Diagnosis not present

## 2020-04-30 DIAGNOSIS — M21372 Foot drop, left foot: Secondary | ICD-10-CM | POA: Diagnosis not present

## 2020-05-01 ENCOUNTER — Ambulatory Visit: Payer: Medicare Other | Admitting: Neurology

## 2020-05-07 ENCOUNTER — Telehealth: Payer: Self-pay

## 2020-05-07 NOTE — Telephone Encounter (Signed)
PT LVM TO CANCEL 9/8 APPT RESCHEDULE TO 10/25 ATT TO CONTACT PT TO ADVISE TYPE OF APPT IS COMPLETED ON WED OR THUR AND TO GIVE OFC A CALL FOR ANOTHER DATE

## 2020-05-09 ENCOUNTER — Telehealth: Payer: Medicare Other

## 2020-05-09 ENCOUNTER — Other Ambulatory Visit: Payer: Self-pay

## 2020-05-09 ENCOUNTER — Ambulatory Visit: Payer: Self-pay

## 2020-05-09 DIAGNOSIS — G35 Multiple sclerosis: Secondary | ICD-10-CM

## 2020-05-09 DIAGNOSIS — R269 Unspecified abnormalities of gait and mobility: Secondary | ICD-10-CM

## 2020-05-16 NOTE — Patient Instructions (Signed)
Visit Information  Goals Addressed      Patient Stated   .  "I need to have my bathroom remodeled" (pt-stated)        [name] (see longitudinal plan of care for additional care plan information)  Current Barriers:  [name] Knowledge Deficits related to community resources to assist with home modification for bathroom remodel  . Chronic Disease Management support and education needs related to Secondary Chronic Progressive MS, Gait disturbance, High Risk Medication, Constipation   Nurse Case Manager Clinical Goal(s):  [name] Over the next 90 days, patient will work with the embedded BSW Daneen Schick  to address needs related to resources that may assist with home modification such as bathroom remodel   CCM RN CM Interventions:  05/09/20 call completed with patient  . Inter-disciplinary care team collaboration (see longitudinal plan of care) . Determined patient is unable to bathe in her lower level bathroom due to needing a walk in shower . Collaborated with embedded [place] regarding resources requested that assist with a bathroom remodel  . Discussed plans with patient for ongoing care management follow up and provided patient with direct contact information for care management team  Patient Self Care Activities:  . Self administers medications as prescribed . Attends all scheduled provider appointments . Calls pharmacy for medication refills . Performs ADL's independently . Performs IADL's independently . Calls provider office for new concerns or questions  Initial goal documentation     .  COMPLETED: "I use to take thyroid medication but stopped it" (pt-stated)        CARE PLAN ENTRY (see longitudinal plan of care for additional care plan information)  Current Barriers:  [name] Knowledge Deficits related to diagnosis and treatment of thyroid disorder   . Chronic Disease Management support and education needs related to Secondary Chronic Progressive MS, Gait disturbance,  High Risk Medication, Constipation   Nurse Case Manager Clinical Goal(s):  [name] Over the next 90 days, patient will work with [place] RN CM and PCP to address needs related to evaluation and treatment of thyroid disorder  Goal Met  CCM RN CM Interventions:  05/09/20 call completed with patient  . Inter-disciplinary care team collaboration (see longitudinal plan of care) . Evaluation of current treatment plan related to thyroid disorder and patient's adherence to plan as established by provider . Determined patient's TSH level obtained on 03/25/20 is within normal range at 4.53 and no further recommendations are given by PCP at this time  . Discussed plans with patient for ongoing care management follow up and provided patient with direct contact information for care management team  Patient Self Care Activities:  . Self administers medications as prescribed . Attends all scheduled provider appointments . Calls pharmacy for medication refills . Calls provider office for new concerns or questions  Please see past updates related to this goal by clicking on the "Past Updates" button in the selected goal      .  "slow down the progression of my MS" (pt-stated)   On track     [name] (see longtitudinal plan of care for additional care plan information)  Current Barriers:  [name] Knowledge Deficits related to disease process and Self Health Management of MS  . Chronic Disease Management support and education needs related to Secondary Chronic Progressive MS, Gait disturbance, High Risk Medication, Constipation   Nurse Case Manager Clinical Goal(s):  . 05/09/20 New Over the next 90 days, patient will work with the CCM  RN CM to address needs related to education and support to improve Self management of MS   . Over the next 90 days, patient will have increased knowledge and understanding about alternate DME options to consider to help improve her gait/mobility, such as forearm crutches Goal  Met  CCM RN CM Interventions:  05/09/20 call completed with patient  . Evaluation of current treatment plan related to Multiple Sclerosis and patient's adherence to plan as established by provider . Determined patient completed in home PT through [facility] and found this service to be effective for strengthening and stamina . Determined patient will not use [place] Crutches at this time due to not feeling she can safely use them  . Determined patient continues to take Tecfidera w/o noted SE, she denies having recent MS exacerbation, however, she does experience muscle rigidity and leg weakness . Confirmed patient received and reviewed the list of MS drugs found on the nationalmssociety.org website . Noted previously sending an embedded Pharm D referral to assist patient with questions related to alternative medications such as CBD and Taopatch for MD to help improve mobility  . Confirmed patient spoke with her neurologist to address questions related to MS drugs and had all questions answered, she denies having further questions for the embedded Pharm D at this time . Reviewed and discussed next Neuro f/u visit with Dr. Felecia Shelling scheduled for 08/02/20, PCP f/u with [name] for AWV scheduled for 08/08/20 . Discussed plans with patient for ongoing care management follow up and provided patient with direct contact information for care management team  Patient Self Care Activities:  . Self administers medications as prescribed . Attends all scheduled provider appointments . Calls pharmacy for medication refills . Performs ADL's independently . Performs IADL's independently . Calls provider office for new concerns or questions  Please see past updates related to this goal by clicking on the "Past Updates" button in the selected goal        Patient verbalizes understanding of instructions provided today.   Telephone follow up appointment with care management team member scheduled for:  08/13/20  Barb Merino, RN, BSN, CCM Care Management Coordinator [place] [facility]  Direct Phone: [phone]

## 2020-05-16 NOTE — Chronic Care Management (AMB) (Signed)
Care Management   Follow Up Note   05/09/2020 Name: [name] MRN: [id number] DOB: 12/08/1965  Referred by: [name], FNP Reason for referral : Care Coordination ([facility] CC RN CM Call )   [name] is a 54 y.o. year old female who is a primary care patient of [name], [place]. The CCM team was consulted for assistance with chronic disease management and care coordination needs.    Review of patient status, including review of consultants reports, relevant laboratory and other test results, and collaboration with appropriate care team members and the patient's provider was performed as part of comprehensive patient evaluation and provision of chronic care management services.    SDOH (Social Determinants of Health) assessments performed: Yes See Care Plan activities for detailed interventions related to [place])   Placed outbound CC RN CM call to patient for a care plan update.     Outpatient Encounter Medications as of 05/09/2020  Medication Sig  . CVS B12 QUICK DISSOLVE 500 MCG LOZG TAKE 2 TABS BY MOUTH DAILY  . CVS D3 50 MCG (2000 UT) CAPS TAKE 1 CAPSULE BY MOUTH DAILY OR AS DIRECTED  . Multiple Vitamin (MULTIVITAMIN ADULT PO) Take 1 Dose by mouth daily.  [name] OVER THE COUNTER MEDICATION Take 1 Dose by mouth daily. [place]-  Immune supplement  . TECFIDERA 240 MG CPDR Take 1 capsule by mouth twice daily   No facility-administered encounter medications on [place] as of 05/09/2020.     Objective:  No results found for: HGBA1C Lab Results  Component Value Date   CREATININE 0.80 02/20/2020   BP Readings from Last 3 Encounters:  02/20/20 116/88  06/13/19 112/74  11/19/18 132/80    Goals Addressed      Patient Stated   .  "I need to have my bathroom remodeled" (pt-stated)        [place] (see longitudinal plan of care for additional care plan information)  Current Barriers:  [name] Knowledge Deficits related to community resources to assist with home  modification for bathroom remodel  . Chronic Disease Management support and education needs related to Secondary Chronic Progressive MS, Gait disturbance, High Risk Medication, Constipation   Nurse Case Manager Clinical Goal(s):  [name] Over the next 90 days, patient will work with the embedded BSW Daneen Schick  to address needs related to resources that may assist with home modification such as bathroom remodel   CCM RN CM Interventions:  05/09/20 call completed with patient  . Inter-disciplinary care team collaboration (see longitudinal plan of care) . Determined patient is unable to bathe in her lower level bathroom due to needing a walk in shower . Collaborated with embedded [place] regarding resources requested that assist with a bathroom remodel  . Discussed plans with patient for ongoing care management follow up and provided patient with direct contact information for care management team  Patient Self Care Activities:  . Self administers medications as prescribed . Attends all scheduled provider appointments . Calls pharmacy for medication refills . Performs ADL's independently . Performs IADL's independently . Calls provider office for new concerns or questions  Initial goal documentation     .  COMPLETED: "I use to take thyroid medication but stopped it" (pt-stated)        CARE PLAN ENTRY (see longitudinal plan of care for additional care plan information)  Current Barriers:  [name] Knowledge Deficits related to diagnosis and treatment of thyroid disorder   . Chronic Disease  Management support and education needs related to Secondary Chronic Progressive MS, Gait disturbance, High Risk Medication, Constipation   Nurse Case Manager Clinical Goal(s):  [name] Over the next 90 days, patient will work with [place] RN CM and PCP to address needs related to evaluation and treatment of thyroid disorder  Goal Met  CCM RN CM Interventions:  05/09/20 call completed with patient   . Inter-disciplinary care team collaboration (see longitudinal plan of care) . Evaluation of current treatment plan related to thyroid disorder and patient's adherence to plan as established by provider . Determined patient's TSH level obtained on 03/25/20 is within normal range at 4.53 and no further recommendations are given by PCP at this time  . Discussed plans with patient for ongoing care management follow up and provided patient with direct contact information for care management team  Patient Self Care Activities:  . Self administers medications as prescribed . Attends all scheduled provider appointments . Calls pharmacy for medication refills . Calls provider office for new concerns or questions  Please see past updates related to this goal by clicking on the "Past Updates" button in the selected goal      .  "slow down the progression of my MS" (pt-stated)   On track     [place] (see longtitudinal plan of care for additional care plan information)  Current Barriers:  [name] Knowledge Deficits related to disease process and Self Health Management of MS  . Chronic Disease Management support and education needs related to Secondary Chronic Progressive MS, Gait disturbance, High Risk Medication, Constipation   Nurse Case Manager Clinical Goal(s):  . 05/09/20 New Over the next 90 days, patient will work with the CCM RN CM to address needs related to education and support to improve Self management of MS   . Over the next 90 days, patient will have increased knowledge and understanding about alternate DME options to consider to help improve her gait/mobility, such as forearm crutches Goal Met  CCM RN CM Interventions:  05/09/20 call completed with patient  . Evaluation of current treatment plan related to Multiple Sclerosis and patient's adherence to plan as established by provider . Determined patient completed in home PT through [facility] and found this service to be  effective for strengthening and stamina . Determined patient will not use [place] Crutches at this time due to not feeling she can safely use them  . Determined patient continues to take Tecfidera w/o noted SE, she denies having recent MS exacerbation, however, she does experience muscle rigidity and leg weakness . Confirmed patient received and reviewed the list of MS drugs found on the nationalmssociety.org website . Noted previously sending an embedded Pharm D referral to assist patient with questions related to alternative medications such as CBD and Taopatch for MD to help improve mobility  . Confirmed patient spoke with her neurologist to address questions related to MS drugs and had all questions answered, she denies having further questions for the embedded Pharm D at this time . Reviewed and discussed next Neuro f/u visit with Dr. Felecia Shelling scheduled for 08/02/20, PCP f/u with [name] for AWV scheduled for 08/08/20 . Discussed plans with patient for ongoing care management follow up and provided patient with direct contact information for care management team  Patient Self Care Activities:  . Self administers medications as prescribed . Attends all scheduled provider appointments . Calls pharmacy for medication refills . Performs ADL's independently . Performs IADL's independently . Calls provider  office for new concerns or questions  Please see past updates related to this goal by clicking on the "Past Updates" button in the selected goal        Plan:   Telephone follow up appointment with care management team member scheduled for: 08/13/20  Barb Merino, RN, BSN, CCM Care Management Coordinator [place] [facility]  Direct Phone: [phone]

## 2020-05-18 ENCOUNTER — Ambulatory Visit: Payer: Medicare Other

## 2020-05-18 DIAGNOSIS — G35 Multiple sclerosis: Secondary | ICD-10-CM

## 2020-05-18 NOTE — Chronic Care Management (AMB) (Signed)
Care Management    Social Work Follow Up Note  05/18/2020 Name: [name] MRN: [id number] DOB: [birth year]  [name] is a 54 y.o. year old female who is a primary care patient of [name], [place]. The CCM team was consulted for assistance with care coordination.   Review of patient status, including review of consultants reports, other relevant assessments, and collaboration with appropriate care team members and the patient's provider was performed as part of comprehensive patient evaluation and provision of chronic care management services.    SDOH (Social Determinants of Health) assessments performed: No    Outpatient Encounter Medications as of 05/18/2020  Medication Sig   [employer]S B12 QUICK DISSOLVE 500 MCG LOZG TAKE 2 TABS BY MOUTH DAILY   CVS D3 50 MCG (2000 UT) CAPS TAKE 1 CAPSULE BY MOUTH DAILY OR AS DIRECTED   Multiple Vitamin (MULTIVITAMIN ADULT PO) Take 1 Dose by mouth daily.   OVER THE COUNTER MEDICATION Take 1 Dose by mouth daily. [place]-  Immune supplement   TECFIDERA 240 MG CPDR Take 1 capsule by mouth twice daily   No facility-administered encounter medications on file as of 05/18/2020.     Goals Addressed              This Visit's Progress     Patient Stated     "I need to have my bathroom remodeled" (pt-stated)   On track     [place] (see longitudinal plan of care for additional care plan information)  Current Barriers:   Knowledge Deficits related to community resources to assist with home modification for bathroom remodel   Chronic Disease Management support and education needs related to Secondary Chronic Progressive MS, Gait disturbance, High Risk Medication, Constipation   Nurse Case Manager Clinical Goal(s):   Over the next 90 days, patient will work with the embedded BSW Daneen Schick  to address needs related to resources that may assist with home modification such as bathroom remodel   CCM SW  Interventions: Completed 05/18/20  Successful outbound call placed to the patient to determine home modification needs o The patient reports her bathroom downstairs is a half bath which she would like to expand to add a walk in shower o The patient reports she has a stair lift but experiences difficulty accessing it on her own due to placement- the patient would like a wall re-figured to support independent use  Discussed [facility] program o Patient reports interest in pursuing assistance from program o Advised the patient SW is unsure of program limitations surrounding modifications  Collaboration with Ellison Hughs Tharrington and [name] to inquire if home modification needs can be addressed under [place] program  Scheduled follow up call to the patient over the next week  CCM RN CM Interventions:  05/09/20 call completed with patient   Inter-disciplinary care team collaboration (see longitudinal plan of care)  Determined patient is unable to bathe in her lower level bathroom due to needing a walk in shower  Collaborated with embedded BSW Daneen Schick regarding resources requested that assist with a bathroom remodel   Discussed plans with patient for ongoing care management follow up and provided patient with direct contact information for care management team  Patient Self Care Activities:   Self administers medications as prescribed  Attends all scheduled provider appointments  Calls pharmacy for medication refills  Performs ADL's independently  Performs IADL's independently  Calls provider office for new concerns or questions  Please see past updates related to this goal by clicking on the "Past Updates" button in the selected goal          Follow Up Plan: SW will follow up with patient by phone over the next week.   Daneen Schick, BSW, CDP Social Worker, Certified Dementia Practitioner [place] [facility] [place] [place] [facility] [phone]

## 2020-05-18 NOTE — Patient Instructions (Signed)
Social Worker Visit Information  Goals we discussed today:  Goals Addressed              This Visit's Progress     Patient Stated   .  "I need to have my bathroom remodeled" (pt-stated)   On track     [place] (see longitudinal plan of care for additional care plan information)  Current Barriers:  [name] Knowledge Deficits related to community resources to assist with home modification for bathroom remodel  . Chronic Disease Management support and education needs related to Secondary Chronic Progressive MS, Gait disturbance, High Risk Medication, Constipation   Nurse Case Manager Clinical Goal(s):  [name] Over the next 90 days, patient will work with the embedded BSW Daneen Schick  to address needs related to resources that may assist with home modification such as bathroom remodel   CCM SW Interventions: Completed 05/18/20 . Successful outbound call placed to the patient to determine home modification needs o The patient reports her bathroom downstairs is a half bath which she would like to expand to add a walk in shower o The patient reports she has a stair lift but experiences difficulty accessing it on her own due to placement- the patient would like a wall re-figured to support independent use . Discussed [facility] program o Patient reports interest in pursuing assistance from program o Advised the patient SW is unsure of program limitations surrounding modifications . Collaboration with Estanislado Pandy and [name] to inquire if home modification needs can be addressed under [place] program . Scheduled follow up call to the patient over the next week  CCM RN CM Interventions:  05/09/20 call completed with patient  . Inter-disciplinary care team collaboration (see longitudinal plan of care) . Determined patient is unable to bathe in her lower level bathroom due to needing a walk in shower . Collaborated with embedded [place] regarding  resources requested that assist with a bathroom remodel  . Discussed plans with patient for ongoing care management follow up and provided patient with direct contact information for care management team  Patient Self Care Activities:  . Self administers medications as prescribed . Attends all scheduled provider appointments . Calls pharmacy for medication refills . Performs ADL's independently . Performs IADL's independently . Calls provider office for new concerns or questions  Please see past updates related to this goal by clicking on the "Past Updates" button in the selected goal          Follow Up Plan: SW will follow up with patient by phone over the next week.   Daneen Schick, BSW, CDP Social Worker, Certified Dementia Practitioner [place] [facility] [place] [facility] [phone]

## 2020-05-23 ENCOUNTER — Ambulatory Visit: Payer: Medicare Other

## 2020-05-23 ENCOUNTER — Encounter: Payer: Medicare Other | Admitting: Nurse Practitioner

## 2020-05-23 DIAGNOSIS — G35 Multiple sclerosis: Secondary | ICD-10-CM

## 2020-05-23 DIAGNOSIS — R269 Unspecified abnormalities of gait and mobility: Secondary | ICD-10-CM

## 2020-05-23 NOTE — Chronic Care Management (AMB) (Signed)
Care Management    Social Work Follow Up Note  05/23/2020 Name: [name] MRN: [id number] DOB: 02/08/1966  [name] is a 54 y.o. year old female who is a primary care patient of [name], [place]. The CCM team was consulted for assistance with care coordination.   Review of patient status, including review of consultants reports, other relevant assessments, and collaboration with appropriate care team members and the patient's provider was performed as part of comprehensive patient evaluation and provision of chronic care management services.    SDOH (Social Determinants of Health) assessments performed: No    Outpatient Encounter Medications as of 05/23/2020  Medication Sig   CVS B12 QUICK DISSOLVE 500 MCG LOZG TAKE 2 TABS BY MOUTH DAILY   CVS D3 50 MCG (2000 UT) CAPS TAKE 1 CAPSULE BY MOUTH DAILY OR AS DIRECTED   Multiple Vitamin (MULTIVITAMIN ADULT PO) Take 1 Dose by mouth daily.   OVER THE COUNTER MEDICATION Take 1 Dose by mouth daily. [place]-  Immune supplement   TECFIDERA 240 MG CPDR Take 1 capsule by mouth twice daily   No facility-administered encounter medications on file as of 05/23/2020.     Goals Addressed              This Visit's Progress     Patient Stated     "I need to have my bathroom remodeled" (pt-stated)   On track     [place] (see longitudinal plan of care for additional care plan information)  Current Barriers:   Knowledge Deficits related to community resources to assist with home modification for bathroom remodel   Chronic Disease Management support and education needs related to Secondary Chronic Progressive MS, Gait disturbance, High Risk Medication, Constipation   Nurse Case Manager Clinical Goal(s):   Over the next 90 days, patient will work with the embedded BSW Daneen Schick  to address needs related to resources that may assist with home modification such as bathroom remodel   CCM SW Interventions: Completed  05/23/20  Inbound call received from [name] with [facility] to complete referral o [name] reports she will plan to outreach the patient to complete intake for bathroom modification and chair lift adjustment  SW will follow up with the patient on 05/24/20 as previously scheduled  Completed 05/18/20  Successful outbound call placed to the patient to determine home modification needs o The patient reports her bathroom downstairs is a half bath which she would like to expand to add a walk in shower o The patient reports she has a stair lift but experiences difficulty accessing it on her own due to placement- the patient would like a wall re-figured to support independent use  Discussed [facility] Living program o Patient reports interest in pursuing assistance from program o Advised the patient SW is unsure of program limitations surrounding modifications  Collaboration with Ellison Hughs Tharrington and [name] to inquire if home modification needs can be addressed under [place] program  Scheduled follow up call to the patient over the next week  CCM RN CM Interventions:  05/09/20 call completed with patient   Inter-disciplinary care team collaboration (see longitudinal plan of care)  Determined patient is unable to bathe in her lower level bathroom due to needing a walk in shower  Collaborated with embedded BSW Daneen Schick regarding resources requested that assist with a bathroom remodel   Discussed plans with patient for ongoing care management follow up and  provided patient with direct contact information for care management team  Patient Self Care Activities:   Self administers medications as prescribed  Attends all scheduled provider appointments  Calls pharmacy for medication refills  Performs ADL's independently  Performs IADL's independently  Calls provider office for new concerns or questions  Please see past  updates related to this goal by clicking on the "Past Updates" button in the selected goal          Follow Up Plan: SW will follow up with patient by phone over the next day as previously scheduled   Daneen Schick, BSW, CDP Social Worker, Certified Dementia Practitioner [place] [facility] [place] [facility] [phone]

## 2020-05-24 ENCOUNTER — Ambulatory Visit: Payer: Medicare Other

## 2020-05-24 DIAGNOSIS — G35 Multiple sclerosis: Secondary | ICD-10-CM

## 2020-05-24 NOTE — Patient Instructions (Signed)
Visit Information  Goals Addressed              This Visit's Progress     Patient Stated   .  "I need to have my bathroom remodeled" (pt-stated)        [name] (see longitudinal plan of care for additional care plan information)  Current Barriers:  [name] Knowledge Deficits related to community resources to assist with home modification for bathroom remodel  . Chronic Disease Management support and education needs related to Secondary Chronic Progressive MS, Gait disturbance, High Risk Medication, Constipation   Nurse Case Manager Clinical Goal(s):  [name] Over the next 90 days, patient will work with the embedded BSW Daneen Schick  to address needs related to resources that may assist with home modification such as bathroom remodel   CCM SW Interventions: Completed 05/24/20 . Successful outbound call placed to the patient to inform of referral placed to [facility] . Advised the patient to expect a call from [name] to complete intake . Scheduled follow up over the next two weeks to assess goal progression  Completed 05/23/20 . Inbound call received from [name] with [facility] to complete referral o [name] reports she will plan to outreach the patient to complete intake for bathroom modification and chair lift adjustment . SW will follow up with the patient on 05/24/20 as previously scheduled  Completed 05/18/20 . Successful outbound call placed to the patient to determine home modification needs o The patient reports her bathroom downstairs is a half bath which she would like to expand to add a walk in shower o The patient reports she has a stair lift but experiences difficulty accessing it on her own due to placement- the patient would like a wall re-figured to support independent use . Discussed [facility] program o Patient reports interest in pursuing assistance from program o Advised the patient SW is  unsure of program limitations surrounding modifications . Collaboration with Estanislado Pandy and [name] to inquire if home modification needs can be addressed under [place] program . Scheduled follow up call to the patient over the next week  CCM RN CM Interventions:  05/09/20 call completed with patient  . Inter-disciplinary care team collaboration (see longitudinal plan of care) . Determined patient is unable to bathe in her lower level bathroom due to needing a walk in shower . Collaborated with embedded [place] regarding resources requested that assist with a bathroom remodel  . Discussed plans with patient for ongoing care management follow up and provided patient with direct contact information for care management team  Patient Self Care Activities:  . Self administers medications as prescribed . Attends all scheduled provider appointments . Calls pharmacy for medication refills . Performs ADL's independently . Performs IADL's independently . Calls provider office for new concerns or questions  Please see past updates related to this goal by clicking on the "Past Updates" button in the selected goal         The care management team will reach out to the patient again over the next 14 days.   Daneen Schick, BSW, CDP Social Worker, Certified Dementia Practitioner [place] [facility] [place] [facility] [phone]

## 2020-05-24 NOTE — Chronic Care Management (AMB) (Signed)
Care Management    Social Work Follow Up Note  05/24/2020 Name: [name] MRN: [id number] DOB: 1966-03-31  [name] is a 54 y.o. year old female who is a primary care patient of [name], [place]. The CCM team was consulted for assistance with care coordination.   Review of patient status, including review of consultants reports, other relevant assessments, and collaboration with appropriate care team members and the patient's provider was performed as part of comprehensive patient evaluation and provision of chronic care management services.    SDOH (Social Determinants of Health) assessments performed: No    Outpatient Encounter Medications as of 05/24/2020  Medication Sig  . CVS B12 QUICK DISSOLVE 500 MCG LOZG TAKE 2 TABS BY MOUTH DAILY  . CVS D3 50 MCG (2000 UT) CAPS TAKE 1 CAPSULE BY MOUTH DAILY OR AS DIRECTED  . Multiple Vitamin (MULTIVITAMIN ADULT PO) Take 1 Dose by mouth daily.  [name] OVER THE COUNTER MEDICATION Take 1 Dose by mouth daily. [place]-  Immune supplement  . TECFIDERA 240 MG CPDR Take 1 capsule by mouth twice daily   No facility-administered encounter medications on file as of 05/24/2020.     Goals Addressed              This Visit's Progress     Patient Stated   .  "I need to have my bathroom remodeled" (pt-stated)        [place] (see longitudinal plan of care for additional care plan information)  Current Barriers:  [name] Knowledge Deficits related to community resources to assist with home modification for bathroom remodel  . Chronic Disease Management support and education needs related to Secondary Chronic Progressive MS, Gait disturbance, High Risk Medication, Constipation   Nurse Case Manager Clinical Goal(s):  [name] Over the next 90 days, patient will work with the embedded BSW [name]  to address needs related to resources that may assist with home modification such as bathroom remodel   CCM SW Interventions: Completed  05/24/20 . Successful outbound call placed to the patient to inform of referral placed to [facility]ocational Rehab . Advised the patient to expect a call from [name] to complete intake . Scheduled follow up over the next two weeks to assess goal progression  Completed 05/23/20 . Inbound call received from [name] with [facility] to complete referral o [name] reports she will plan to outreach the patient to complete intake for bathroom modification and chair lift adjustment . SW will follow up with the patient on 05/24/20 as previously scheduled  Completed 05/18/20 . Successful outbound call placed to the patient to determine home modification needs o The patient reports her bathroom downstairs is a half bath which she would like to expand to add a walk in shower o The patient reports she has a stair lift but experiences difficulty accessing it on her own due to placement- the patient would like a wall re-figured to support independent use . Discussed [facility] Living program o Patient reports interest in pursuing assistance from program o Advised the patient SW is unsure of program limitations surrounding modifications . Collaboration with [name] and [name] to inquire if home modification needs can be addressed under [place] program . Scheduled follow up call to the patient over the next week  CCM RN CM Interventions:  05/09/20 call completed with patient  . Inter-disciplinary care team collaboration (see longitudinal plan of care) . Determined patient  is unable to bathe in her lower level bathroom due to needing a walk in shower . Collaborated with embedded [name] regarding resources requested that assist with a bathroom remodel  . Discussed plans with patient for ongoing care management follow up and provided patient with direct contact information for care management team  Patient Self Care Activities:  . Self  administers medications as prescribed . Attends all scheduled provider appointments . Calls pharmacy for medication refills . Performs ADL's independently . Performs IADL's independently . Calls provider office for new concerns or questions  Please see past updates related to this goal by clicking on the "Past Updates" button in the selected goal          Follow Up Plan: SW will follow up with patient by phone over the next two weeks.   [name], BSW, CDP Social Worker, Certified Dementia Practitioner [place] [facility] [place] [facility] [phone]

## 2020-05-27 ENCOUNTER — Other Ambulatory Visit: Payer: Self-pay | Admitting: Neurology

## 2020-06-06 ENCOUNTER — Ambulatory Visit: Payer: Medicare Other

## 2020-06-06 DIAGNOSIS — G35 Multiple sclerosis: Secondary | ICD-10-CM

## 2020-06-06 DIAGNOSIS — R269 Unspecified abnormalities of gait and mobility: Secondary | ICD-10-CM

## 2020-06-06 NOTE — Patient Instructions (Signed)
Visit Information  Goals Addressed              This Visit's Progress     Patient Stated   .  "I need to have my bathroom remodeled" (pt-stated)   On track     [place] (see longitudinal plan of care for additional care plan information)  Current Barriers:  [name] Knowledge Deficits related to community resources to assist with home modification for bathroom remodel  . Chronic Disease Management support and education needs related to Secondary Chronic Progressive MS, Gait disturbance, High Risk Medication, Constipation   Nurse Case Manager Clinical Goal(s):  [name] Over the next 90 days, patient will work with the embedded BSW Daneen Schick  to address needs related to resources that may assist with home modification such as bathroom remodel  Goal Met . New 06/06/20- Over the next 30 days the patient will complete intake packet provided by [facility] in regards to home modification needs  CCM SW Interventions: Completed 06/06/20 . Successful outbound call placed to the patient to assess goal progression . Determined the patient was contacted by [facility] to complete a phone intake call . The patient has received intake paperwork via mail and is in the process of completing . Advised the patient home modification under this program may take several months due to it being a state funded program  . Scheduled follow up call over the next month  Completed 05/24/20 . Successful outbound call placed to the patient to inform of referral placed to [facility]hab . Advised the patient to expect a call from [name] to complete intake . Scheduled follow up over the next two weeks to assess goal progression  Completed 05/23/20 . Inbound call received from [name] with [facility] to complete referral o [name] reports she will plan to outreach the patient to complete intake for bathroom modification and chair lift  adjustment . SW will follow up with the patient on 05/24/20 as previously scheduled  Completed 05/18/20 . Successful outbound call placed to the patient to determine home modification needs o The patient reports her bathroom downstairs is a half bath which she would like to expand to add a walk in shower o The patient reports she has a stair lift but experiences difficulty accessing it on her own due to placement- the patient would like a wall re-figured to support independent use . Discussed [facility] Living program o Patient reports interest in pursuing assistance from program o Advised the patient SW is unsure of program limitations surrounding modifications . Collaboration with Estanislado Pandy and [name] to inquire if home modification needs can be addressed under [place] program . Scheduled follow up call to the patient over the next week  CCM RN CM Interventions:  05/09/20 call completed with patient  . Inter-disciplinary care team collaboration (see longitudinal plan of care) . Determined patient is unable to bathe in her lower level bathroom due to needing a walk in shower . Collaborated with embedded [place] regarding resources requested that assist with a bathroom remodel  . Discussed plans with patient for ongoing care management follow up and provided patient with direct contact information for care management team  Patient Self Care Activities:  . Self administers medications as prescribed . Attends all scheduled provider appointments . Calls pharmacy for medication refills . Performs ADL's independently . Performs IADL's independently . Calls provider office for new concerns or questions  Please see past updates related to this goal by clicking on the "Past Updates" button in the selected goal         The care management team will reach out to the patient again over the next 45 days.   Daneen Schick, BSW, CDP Social Worker, Certified  Dementia Practitioner [place] [facility] [place] [facility] [phone]

## 2020-06-06 NOTE — Chronic Care Management (AMB) (Signed)
Care Management    Social Work Follow Up Note  06/06/2020 Name: LAURELL COALSON MRN: [id number] DOB: [birth year]  [name] Serrao is a 54 y.o. year old female who is a primary care patient of [name], [place]. The CCM team was consulted for assistance with care coordination.   Review of patient status, including review of consultants reports, other relevant assessments, and collaboration with appropriate care team members and the patient's provider was performed as part of comprehensive patient evaluation and provision of chronic care management services.    SDOH (Social Determinants of Health) assessments performed: No    Outpatient Encounter Medications as of 06/06/2020  Medication Sig  . B-12 MICROLOZENGE 500 MCG SUBL TAKE 2 TABS BY MOUTH DAILY  . CVS B12 QUICK DISSOLVE 500 MCG LOZG TAKE 2 TABS BY MOUTH DAILY  . CVS D3 50 MCG (2000 UT) CAPS TAKE 1 CAPSULE BY MOUTH DAILY OR AS DIRECTED  . Multiple Vitamin (MULTIVITAMIN ADULT PO) Take 1 Dose by mouth daily.  [name] OVER THE COUNTER MEDICATION Take 1 Dose by mouth daily. [place]-  Immune supplement  . TECFIDERA 240 MG CPDR Take 1 capsule by mouth twice daily   No facility-administered encounter medications on file as of 06/06/2020.     Goals Addressed              This Visit's Progress     Patient Stated   .  "I need to have my bathroom remodeled" (pt-stated)   On track     [place] (see longitudinal plan of care for additional care plan information)  Current Barriers:  [name] Knowledge Deficits related to community resources to assist with home modification for bathroom remodel  . Chronic Disease Management support and education needs related to Secondary Chronic Progressive MS, Gait disturbance, High Risk Medication, Constipation   Nurse Case Manager Clinical Goal(s):  [name] Over the next 90 days, patient will work with the embedded BSW Daneen Schick  to address needs related to resources that may assist with home  modification such as bathroom remodel  Goal Met . New 06/06/20- Over the next 30 days the patient will complete intake packet provided by [facility] in regards to home modification needs  CCM SW Interventions: Completed 06/06/20 . Successful outbound call placed to the patient to assess goal progression . Determined the patient was contacted by [facility] to complete a phone intake call . The patient has received intake paperwork via mail and is in the process of completing . Advised the patient home modification under this program may take several months due to it being a state funded program  . Scheduled follow up call over the next month  Completed 05/24/20 . Successful outbound call placed to the patient to inform of referral placed to [facility] . Advised the patient to expect a call from [name] to complete intake . Scheduled follow up over the next two weeks to assess goal progression  Completed 05/23/20 . Inbound call received from [name] with [facility] to complete referral o [name] reports she will plan to outreach the patient to complete intake for bathroom modification and chair lift adjustment . SW will follow up with the patient on 05/24/20 as previously scheduled  Completed 05/18/20 . Successful outbound call placed to the patient to determine home modification needs o The patient reports her bathroom downstairs is a half bath which she would like to expand to add a  walk in shower o The patient reports she has a stair lift but experiences difficulty accessing it on her own due to placement- the patient would like a wall re-figured to support independent use . Discussed [facility] o Patient reports interest in pursuing assistance from program o Advised the patient SW is unsure of program limitations surrounding modifications . Collaboration with Estanislado Pandy  and [name] to inquire if home modification needs can be addressed under [place] program . Scheduled follow up call to the patient over the next week  CCM RN CM Interventions:  05/09/20 call completed with patient  . Inter-disciplinary care team collaboration (see longitudinal plan of care) . Determined patient is unable to bathe in her lower level bathroom due to needing a walk in shower . Collaborated with embedded [place] regarding resources requested that assist with a bathroom remodel  . Discussed plans with patient for ongoing care management follow up and provided patient with direct contact information for care management team  Patient Self Care Activities:  . Self administers medications as prescribed . Attends all scheduled provider appointments . Calls pharmacy for medication refills . Performs ADL's independently . Performs IADL's independently . Calls provider office for new concerns or questions  Please see past updates related to this goal by clicking on the "Past Updates" button in the selected goal          Follow Up Plan: SW will follow up with patient by phone over the next month   Daneen Schick, BSW, CDP Social Worker, Certified Dementia Practitioner [facility] [phone]

## 2020-07-03 DIAGNOSIS — Z1211 Encounter for screening for malignant neoplasm of colon: Secondary | ICD-10-CM | POA: Diagnosis not present

## 2020-07-03 LAB — COLOGUARD: Cologuard: NEGATIVE

## 2020-07-09 DIAGNOSIS — L821 Other seborrheic keratosis: Secondary | ICD-10-CM | POA: Diagnosis not present

## 2020-07-09 DIAGNOSIS — L720 Epidermal cyst: Secondary | ICD-10-CM | POA: Diagnosis not present

## 2020-07-09 DIAGNOSIS — L853 Xerosis cutis: Secondary | ICD-10-CM | POA: Diagnosis not present

## 2020-07-09 DIAGNOSIS — L918 Other hypertrophic disorders of the skin: Secondary | ICD-10-CM | POA: Diagnosis not present

## 2020-07-09 DIAGNOSIS — L438 Other lichen planus: Secondary | ICD-10-CM | POA: Diagnosis not present

## 2020-07-10 ENCOUNTER — Ambulatory Visit: Payer: Medicare Other

## 2020-07-10 DIAGNOSIS — R269 Unspecified abnormalities of gait and mobility: Secondary | ICD-10-CM

## 2020-07-10 DIAGNOSIS — G35 Multiple sclerosis: Secondary | ICD-10-CM

## 2020-07-10 NOTE — Patient Instructions (Signed)
Visit Information  Goals Addressed              This Visit's Progress     Patient Stated     "I need to have my bathroom remodeled" (pt-stated)   On track     [place] (see longitudinal plan of care for additional care plan information)  Current Barriers:   Knowledge Deficits related to community resources to assist with home modification for bathroom remodel   Chronic Disease Management support and education needs related to Secondary Chronic Progressive MS, Gait disturbance, High Risk Medication, Constipation   Nurse Case Manager Clinical Goal(s):   Over the next 90 days, patient will work with the embedded BSW Daneen Schick  to address needs related to resources that may assist with home modification such as bathroom remodel  Goal Met  New 06/06/20- Over the next 30 days the patient will complete intake packet provided by [facility] in regards to home modification needs Goal Met  New 07/10/20 Over the next 120 days the patient will work with [place] regarding home modification needs  CCM SW Interventions: Completed 07/10/20  Successful outbound call placed to the patient to assess goal progression  Determined the patient spoke with [facility] approximately 1 week ago to confirm patient packet and physician paperwork had been received  Patient reports she is awaiting to hear when the engineer will come out to the home  Scheduled follow up call over the next 60 days o Encouraged the patient to contact SW as needed prior to next scheduled call  Completed 06/06/20  Successful outbound call placed to the patient to assess goal progression  Determined the patient was contacted by [facility] to complete a phone intake call  The patient has received intake paperwork via mail and is in the process of completing  Advised the patient home modification under this program may take several months due to it being a state funded  program   Scheduled follow up call over the next month  Completed 05/24/20  Successful outbound call placed to the patient to inform of referral placed to [facility]  Advised the patient to expect a call from [name] to complete intake  Scheduled follow up over the next two weeks to assess goal progression  Completed 05/23/20  Inbound call received from [name] with [facility] to complete referral o [name] reports she will plan to outreach the patient to complete intake for bathroom modification and chair lift adjustment  SW will follow up with the patient on 05/24/20 as previously scheduled  Completed 05/18/20  Successful outbound call placed to the patient to determine home modification needs o The patient reports her bathroom downstairs is a half bath which she would like to expand to add a walk in shower o The patient reports she has a stair lift but experiences difficulty accessing it on her own due to placement- the patient would like a wall re-figured to support independent use  Discussed [facility] o Patient reports interest in pursuing assistance from program o Advised the patient SW is unsure of program limitations surrounding modifications  Collaboration with [name] and [name] to inquire if home modification needs can be addressed under [place] program  Scheduled follow up call to the patient over the next week  CCM RN CM Interventions:  05/09/20 call completed with patient   Inter-disciplinary care team collaboration (see longitudinal plan of care)  Determined patient is unable to bathe in her lower level bathroom due to needing a walk in shower  Collaborated with embedded [place] regarding resources requested that assist with a bathroom remodel   Discussed plans with patient for ongoing care management follow up and provided patient with direct contact  information for care management team  Patient Self Care Activities:   Self administers medications as prescribed  Attends all scheduled provider appointments  Calls pharmacy for medication refills  Performs ADL's independently  Performs IADL's independently  Calls provider office for new concerns or questions  Please see past updates related to this goal by clicking on the "Past Updates" button in the selected goal         The care management team will reach out to the patient again over the next 60 days.   Daneen Schick, BSW, CDP Social Worker, Certified Dementia Practitioner [place] [facility] [phone]

## 2020-07-10 NOTE — Chronic Care Management (AMB) (Signed)
Care Management    Social Work Follow Up Note  07/10/2020 Name: [name] MRN: [id number] DOB: 01/12/1966  [name] is a 54 y.o. year old female who is a primary care patient of [name], [place]. The CCM team was consulted for assistance with care coordination.   Review of patient status, including review of consultants reports, other relevant assessments, and collaboration with appropriate care team members and the patient's provider was performed as part of comprehensive patient evaluation and provision of chronic care management services.    SDOH (Social Determinants of Health) assessments performed: No    Outpatient Encounter Medications as of 07/10/2020  Medication Sig  . B-12 MICROLOZENGE 500 MCG SUBL TAKE 2 TABS BY MOUTH DAILY  . CVS B12 QUICK DISSOLVE 500 MCG LOZG TAKE 2 TABS BY MOUTH DAILY  . CVS D3 50 MCG (2000 UT) CAPS TAKE 1 CAPSULE BY MOUTH DAILY OR AS DIRECTED  . Multiple Vitamin (MULTIVITAMIN ADULT PO) Take 1 Dose by mouth daily.  [name] OVER THE COUNTER MEDICATION Take 1 Dose by mouth daily. [place]-  Immune supplement  . TECFIDERA 240 MG CPDR Take 1 capsule by mouth twice daily   No facility-administered encounter medications on file as of 07/10/2020.     Goals Addressed              This Visit's Progress     Patient Stated   .  "I need to have my bathroom remodeled" (pt-stated)   On track     [place] (see longitudinal plan of care for additional care plan information)  Current Barriers:  [name] Knowledge Deficits related to community resources to assist with home modification for bathroom remodel  . Chronic Disease Management support and education needs related to Secondary Chronic Progressive MS, Gait disturbance, High Risk Medication, Constipation   Nurse Case Manager Clinical Goal(s):  [name] Over the next 90 days, patient will work with the embedded BSW Daneen Schick  to address needs related to resources that may assist with home  modification such as bathroom remodel  Goal Met . New 06/06/20- Over the next 30 days the patient will complete intake packet provided by [facility] in regards to home modification needs Goal Met . New 07/10/20 Over the next 120 days the patient will work with [facility] regarding home modification needs  CCM SW Interventions: Completed 07/10/20 . Successful outbound call placed to the patient to assess goal progression . Determined the patient spoke with [facility] approximately 1 week ago to confirm patient packet and physician paperwork had been received . Patient reports she is awaiting to hear when the engineer will come out to the home . Scheduled follow up call over the next 60 days o Encouraged the patient to contact SW as needed prior to next scheduled call  Completed 06/06/20 . Successful outbound call placed to the patient to assess goal progression . Determined the patient was contacted by [facility] to complete a phone intake call . The patient has received intake paperwork via mail and is in the process of completing . Advised the patient home modification under this program may take several months due to it being a state funded program  . Scheduled follow up call over the next month  Completed 05/24/20 . Successful outbound call placed to the patient to inform of referral placed to [facility] . Advised the patient to expect a call from [name] to complete intake . Scheduled follow  up over the next two weeks to assess goal progression  Completed 05/23/20 . Inbound call received from [name] with [facility] to complete referral o [name] reports she will plan to outreach the patient to complete intake for bathroom modification and chair lift adjustment . SW will follow up with the patient on 05/24/20 as previously scheduled  Completed 05/18/20 . Successful outbound call placed to the  patient to determine home modification needs o The patient reports her bathroom downstairs is a half bath which she would like to expand to add a walk in shower o The patient reports she has a stair lift but experiences difficulty accessing it on her own due to placement- the patient would like a wall re-figured to support independent use . Discussed [facility] o Patient reports interest in pursuing assistance from program o Advised the patient SW is unsure of program limitations surrounding modifications . Collaboration with Estanislado Pandy and [name] to inquire if home modification needs can be addressed under [place] program . Scheduled follow up call to the patient over the next week  CCM RN CM Interventions:  05/09/20 call completed with patient  . Inter-disciplinary care team collaboration (see longitudinal plan of care) . Determined patient is unable to bathe in her lower level bathroom due to needing a walk in shower . Collaborated with embedded [place] regarding resources requested that assist with a bathroom remodel  . Discussed plans with patient for ongoing care management follow up and provided patient with direct contact information for care management team  Patient Self Care Activities:  . Self administers medications as prescribed . Attends all scheduled provider appointments . Calls pharmacy for medication refills . Performs ADL's independently . Performs IADL's independently . Calls provider office for new concerns or questions  Please see past updates related to this goal by clicking on the "Past Updates" button in the selected goal          Follow Up Plan: SW will follow up with patient by phone over the next 60 days.   Daneen Schick, BSW, CDP Social Worker, Certified Dementia Practitioner [facility] [facility] [phone]

## 2020-07-13 LAB — COLOGUARD: COLOGUARD: NEGATIVE

## 2020-07-16 ENCOUNTER — Encounter: Payer: Self-pay | Admitting: Nurse Practitioner

## 2020-08-02 ENCOUNTER — Encounter: Payer: Self-pay | Admitting: Neurology

## 2020-08-02 ENCOUNTER — Ambulatory Visit (INDEPENDENT_AMBULATORY_CARE_PROVIDER_SITE_OTHER): Payer: BC Managed Care – PPO | Admitting: Neurology

## 2020-08-02 VITALS — BP 139/90 | HR 92 | Ht 66.0 in | Wt 296.5 lb

## 2020-08-02 DIAGNOSIS — Z79899 Other long term (current) drug therapy: Secondary | ICD-10-CM | POA: Diagnosis not present

## 2020-08-02 DIAGNOSIS — I890 Lymphedema, not elsewhere classified: Secondary | ICD-10-CM | POA: Diagnosis not present

## 2020-08-02 DIAGNOSIS — F439 Reaction to severe stress, unspecified: Secondary | ICD-10-CM

## 2020-08-02 DIAGNOSIS — R269 Unspecified abnormalities of gait and mobility: Secondary | ICD-10-CM | POA: Diagnosis not present

## 2020-08-02 DIAGNOSIS — R799 Abnormal finding of blood chemistry, unspecified: Secondary | ICD-10-CM | POA: Diagnosis not present

## 2020-08-02 DIAGNOSIS — M21372 Foot drop, left foot: Secondary | ICD-10-CM

## 2020-08-02 DIAGNOSIS — G35 Multiple sclerosis: Secondary | ICD-10-CM | POA: Diagnosis not present

## 2020-08-02 DIAGNOSIS — R946 Abnormal results of thyroid function studies: Secondary | ICD-10-CM | POA: Diagnosis not present

## 2020-08-02 DIAGNOSIS — R5383 Other fatigue: Secondary | ICD-10-CM

## 2020-08-02 NOTE — Progress Notes (Signed)
[facility]  PATIENT: [name] DOB: 08-12-1966  REFERRING CLINICIAN: Willey Blade HISTORY FROM: Patient and mother REASON FOR VISIT: Multiple sclerosis   HISTORICAL  CHIEF COMPLAINT:  Chief Complaint  Patient presents with  . Follow-up    RM 13. Last seen 02/23/2020.In [facility] in office today. Reports flaky/dry skin.   . Multiple Sclerosis    On Tecfidera    HISTORY OF PRESENT ILLNESS:  [name] is a 54 y.o. woman with relapsing remitting MS diagnosed in 1997.    Update 08/02/2020: She is on Tecfidera and tolerates it well.   She feels stable on Tecfidera 240 mg bid l.    Labs 06/15/2019 show lymphocyte count of 1.3.   She has had dry skin the past few months - likely unrelated.  She feels her MS has been stable.  She has no exacerbations or new symptoms.   She needs to use a walker because of leg weakness.  She also has spasticity.  She cannot walk more than a few steps without the walker.   She can't walk without her walker.  She can go > 100 feet with a walker but not > 200. She can get up from couch independently using a walker.    Spasticity is not bad.  Her back hurts when she walks longer.   She has had only one fall in last 6 months while transferring to the shower.   She needed help to get back up   Arm strength is fine in right arm, slightly reduced in left arm.  Legs are weak bilaterally.   Bladder is doing well.    Vision is fine.     She has fatigue but it does not prevent her from activities in general.  She sleeps well most well.  She takes occasioanl naps.   She used to snore but not as much recently.    She is eating well.   She takes vitamin D supplements.  She denies depression or anxiety.  Cognition is doing well.    She has ankle edema and sometimes wears support hose.    She has LBP.    This bothers her more when she is standing up and walking  MS History:    In 1997, she presented with backache, poor gait and visual blurring.  She had an MRI and a lumbar puncture. The studies were consistent with multiple sclerosis.  She turned down a disease modifying therapy at that time. Her legs and her walking got completely better and she was back at baseline after the steroids. Over the next 15 years or so she had no additional symptoms. She had worsening symptoms in the left leg that was affecting her balance. MRI 2-3 years ago showed progression. She opted not to go on medication at that time.   She started Tecfidera around 2014   Images: MRI Brain 06/22/2017 shows T2/FLAIR hyperintense foci in the cerebral hemispheres and brainstem in a pattern and configuration consistent with chronic demyelinating plaque associated with multiple sclerosis. None of the foci appears to be acute. When compared to the MRI dated 06/06/2015, there is no interval change.     There is a normal enhancement pattern and there are no acute findings.  MRI cervical spine 06/22/2017 showed several lesions within the spinal cord adjacent to C2, C2-C3, C4 and C7 as described above. None of these enhance after contrast was administered. When compared to the MRI dated 06/06/2015, there has been no definite interval  change.     Multilevel degenerative changes as detailed above. There is mild spinal stenosis at C3-C4.  There is moderate right foraminal narrowing at C3-C4 and at C5-C6. The degenerative changes encroach upon the right C4 and C6 nerve roots though there is no definite nerve root compression.    Compared to the MRI dated 06/06/2015, there is no definite interval change.    There is a normal enhancement pattern.  MRI thoracic spine 06/22/2017 showed hyperintense foci within the spinal cord adjacent to C7, T3, T4, T4-T5, T7, T8-T9, T10 and T11 as described above. None of these appear to enhance after contrast was administered. These are consistent with chronic demyelinating plaque associated with multiple sclerosis.    Small disc protrusion at T7-T8 that does not  lead to any nerve root compression.   REVIEW OF SYSTEMS:  Constitutional: No fevers, chills, sweats, or change in appetite Eyes: No visual changes, double vision, eye pain Ear, nose and throat: No hearing loss, ear pain, nasal congestion, sore throat Cardiovascular: No chest pain, palpitations Respiratory:  No shortness of breath at rest or with exertion.   No wheezes GastrointestinaI: No nausea, vomiting, diarrhea, abdominal pain, fecal incontinence Genitourinary:  No dysuria, urinary retention or frequency.  No nocturia. Musculoskeletal:  No neck pain.   She reports some back pain and occasionally proximal leg pain. Integumentary: No rash, pruritus, skin lesions Neurological: as above Psychiatric: She denies depression but notes that she is sometimes moody. There is no anxiety.  She notes some marital stress Endocrine: No palpitations, diaphoresis, change in appetite or increased thirst Hematologic/Lymphatic:  No anemia, purpura, petechiae. Allergic/Immunologic: No itchy/runny eyes, nasal congestion, recent allergic reactions, rashes  ALLERGIES: Allergies  Allergen Reactions  . Food Other (See Comments)    WHAT FOOD CAUSES THIS REACTION? Eyes swelling and throat swelling  . Oxycodone Hcl Hives  . Soy Allergy     Other reaction(s): SWELLING    HOME MEDICATIONS: Outpatient Medications Prior to Visit  Medication Sig Dispense Refill  . B-12 MICROLOZENGE 500 MCG SUBL TAKE 2 TABS BY MOUTH DAILY 180 tablet 0  . [facility] B12 QUICK DISSOLVE 500 MCG LOZG TAKE 2 TABS BY MOUTH DAILY 180 lozenge 1  . [facility] D3 50 MCG (2000 UT) CAPS TAKE 1 CAPSULE BY MOUTH DAILY OR AS DIRECTED 90 capsule 3  . OVER THE COUNTER MEDICATION Take 1 Dose by mouth daily. [place]-  Immune supplement    . OVER THE COUNTER MEDICATION Take 2 each by mouth daily. Collagen gummies    . Probiotic Product (PROBIOTIC PO) Take 1 Dose by mouth daily.    . TECFIDERA 240 MG CPDR Take 1 capsule by mouth twice daily 180 capsule 3   . TURMERIC-GINGER PO Take 2 each by mouth daily. Gummies    . Multiple Vitamin (MULTIVITAMIN ADULT PO) Take 1 Dose by mouth daily.     No facility-administered medications prior to visit.    PAST MEDICAL HISTORY: Past Medical History:  Diagnosis Date  . Movement disorder   . Multiple sclerosis ([place])     PAST SURGICAL HISTORY: Past Surgical History:  Procedure Laterality Date  . CESAREAN SECTION    . UTERINE FIBROID SURGERY    . WISDOM TOOTH EXTRACTION      FAMILY HISTORY: Family History  Problem Relation Age of Onset  . Hypertension Mother   . Hypertension Father   . Diabetes Father     SOCIAL HISTORY:  Social History   Socioeconomic History  . Marital  status: Married    Spouse name: Not on file  . Number of children: Not on file  . Years of education: Not on file  . Highest education level: Not on file  Occupational History  . Not on file  Tobacco Use  . Smoking status: Never Smoker  . Smokeless tobacco: Never Used  Substance and Sexual Activity  . Alcohol use: No  . Drug use: No  . Sexual activity: Not on file  Other Topics Concern  . Not on file  Social History Narrative  . Not on file   Social Determinants of Health   Financial Resource Strain:   . Difficulty of Paying Living Expenses: Not on file  Food Insecurity:   . Worried About Charity fundraiser in the Last Year: Not on file  . Ran Out of Food in the Last Year: Not on file  Transportation Needs:   . Lack of Transportation (Medical): Not on file  . Lack of Transportation (Non-Medical): Not on file  Physical Activity:   . Days of Exercise per Week: Not on file  . Minutes of Exercise per Session: Not on file  Stress:   . Feeling of Stress : Not on file  Social Connections:   . Frequency of Communication with Friends and Family: Not on file  . Frequency of Social Gatherings with Friends and Family: Not on file  . Attends Religious Services: Not on file  . Active Member of Clubs or  Organizations: Not on file  . Attends Archivist Meetings: Not on file  . Marital Status: Not on file  Intimate Partner Violence:   . Fear of Current or Ex-Partner: Not on file  . Emotionally Abused: Not on file  . Physically Abused: Not on file  . Sexually Abused: Not on file     PHYSICAL EXAM  Vitals:   08/02/20 1554  BP: 139/90  Pulse: 92  SpO2: 98%  Weight: 296 lb 8 oz (134.5 kg)  Height: 5\' 6"  (1.676 m)    Body mass index is 47.86 kg/m.   General: The patient is well-developed and well-nourished and in no acute distress.   Lymphedema bilaterally   Neurologic Exam  Mental status: The patient is alert and oriented x 3 at the time of the examination. The patient has apparent normal recent and remote memory, with an apparently normal attention span and concentration ability.   Speech is normal.  Cranial nerves: She has nystagmus consistent with mild bilateral INO's .  Facial symmetry is present. Facial strength and sensation is normal. Trapezius and sternocleidomastoid strength is normal. No dysarthria is noted.   No obvious hearing deficits are noted.  Motor:  Muscle bulk is normal.  Muscle tone is increased in the legs, left greater than right.  Muscle tone was fairly normal in the arms.  Strength was normal in the right arm and just 4+/5 in the upper left arm and 5/5 in the hand.  She did have reduced left rapid alternating movements in the arms.  In the legs, she has reduced strength bilaterally , 2+/5 on the right with 3 to 4-/5 quads and 2+/5 elsewhere on the left leg  Sensory: In the arms, there was normal sensation to touch and vibration. She had normal touch sensation in the legs but reduced vibratory sensation in the left leg.    Coordination: Cerebellar testing reveals good finger-nose-finger and is unable to do heel-to-shin  Gait and station: She needs strong bilateral support to stand  Reflexes: Deep tendon reflexes are increased on left more than  right legs.     DIAGNOSTIC DATA (LABS, IMAGING, TESTING) - I reviewed patient records, labs, notes, testing and imaging myself where available.  Lab Results  Component Value Date   WBC 8.2 06/15/2019   HGB 13.5 06/15/2019   HCT 39.8 06/15/2019   MCV 86 06/15/2019   PLT 277 06/15/2019      ASSESSMENT AND PLAN  Multiple sclerosis ([place]) - Plan: CBC with Differential/Platelet, Thyroid Panel With TSH, Ambulatory referral to [facility]: CBC with Differential/Platelet, Thyroid Panel With TSH  Stress - Plan: Ambulatory referral to Behavioral Health  High risk medication use - Plan: CBC with Differential/Platelet, Thyroid Panel With TSH  1.   Continue Tecfidera.  We will check a CBC with differential to determine if there is any lymphopenia. 2.   Continue supplementary vitamin D and B12.. 3.   She will use a walker around the house and wheelchair outside the house. 4.   Refer to behavioral health.  She reports marital stress and would like to see counseling with her husband.   5.   rtc 6 months, sooner if problems     Delenn Ahn A. Felecia Shelling, MD, PhD 36/14/4315, 4:00 PM Certified in Neurology, Clinical Neurophysiology, Sleep Medicine, Pain Medicine and Neuroimaging  [facility] [address] [place], [place] [address] [phone]

## 2020-08-03 LAB — CBC WITH DIFFERENTIAL/PLATELET
Basophils Absolute: 0 10*3/uL (ref 0.0–0.2)
Basos: 0 %
EOS (ABSOLUTE): 0.2 10*3/uL (ref 0.0–0.4)
Eos: 2 %
Hematocrit: 45.8 % (ref 34.0–46.6)
Hemoglobin: 15.4 g/dL (ref 11.1–15.9)
Immature Grans (Abs): 0 10*3/uL (ref 0.0–0.1)
Immature Granulocytes: 0 %
Lymphocytes Absolute: 2 10*3/uL (ref 0.7–3.1)
Lymphs: 21 %
MCH: 28.7 pg (ref 26.6–33.0)
MCHC: 33.6 g/dL (ref 31.5–35.7)
MCV: 85 fL (ref 79–97)
Monocytes Absolute: 0.8 10*3/uL (ref 0.1–0.9)
Monocytes: 8 %
Neutrophils Absolute: 6.7 10*3/uL (ref 1.4–7.0)
Neutrophils: 69 %
Platelets: 310 10*3/uL (ref 150–450)
RBC: 5.36 x10E6/uL — ABNORMAL HIGH (ref 3.77–5.28)
RDW: 13.9 % (ref 11.7–15.4)
WBC: 9.7 10*3/uL (ref 3.4–10.8)

## 2020-08-03 LAB — THYROID PANEL WITH TSH
Free Thyroxine Index: 2.1 (ref 1.2–4.9)
T3 Uptake Ratio: 25 % (ref 24–39)
T4, Total: 8.3 ug/dL (ref 4.5–12.0)
TSH: 4.12 u[IU]/mL (ref 0.450–4.500)

## 2020-08-08 ENCOUNTER — Encounter: Payer: Medicare Other | Admitting: Nurse Practitioner

## 2020-08-13 ENCOUNTER — Other Ambulatory Visit: Payer: Self-pay

## 2020-08-13 ENCOUNTER — Telehealth: Payer: BC Managed Care – PPO

## 2020-08-13 ENCOUNTER — Ambulatory Visit: Payer: Self-pay

## 2020-08-13 DIAGNOSIS — R269 Unspecified abnormalities of gait and mobility: Secondary | ICD-10-CM

## 2020-08-13 DIAGNOSIS — G35 Multiple sclerosis: Secondary | ICD-10-CM

## 2020-08-13 DIAGNOSIS — K5900 Constipation, unspecified: Secondary | ICD-10-CM

## 2020-08-13 DIAGNOSIS — R5383 Other fatigue: Secondary | ICD-10-CM

## 2020-08-13 NOTE — Chronic Care Management (AMB) (Signed)
Care Management   Follow Up Note   08/13/2020 Name: [name] MRN: [id number] DOB: 10/23/1965  Referred by: Arnette Felts, FNP Reason for referral : Chronic Care Management (CCM RNCM FU Call )   [name] is a 54 y.o. year old female who is a primary care patient of Arnette Felts, FNP. The CCM team was consulted for assistance with chronic disease management and care coordination needs.    Review of patient status, including review of consultants reports, relevant laboratory and other test results, and collaboration with appropriate care team members and the patient's provider was performed as part of comprehensive patient evaluation and provision of chronic care management services.    SDOH (Social Determinants of Health) assessments performed: Yes - no acute challenges identified  See Care Plan activities for detailed interventions related to SDOH)   Placed outbound CCM RN CM follow up call to patient for a care plan update.     Outpatient Encounter Medications as of 08/13/2020  Medication Sig  . Probiotic Product (PROBIOTIC PO) Take 1 Dose by mouth daily.  . TECFIDERA 240 MG CPDR Take 1 capsule by mouth twice daily  . triamcinolone (KENALOG) 0.1 % Apply 1 application topically 2 (two) times daily.  . B-12 MICROLOZENGE 500 MCG SUBL TAKE 2 TABS BY MOUTH DAILY  . [facility]S B12 QUICK DISSOLVE 500 MCG LOZG TAKE 2 TABS BY MOUTH DAILY  . CVS D3 50 MCG (2000 UT) CAPS TAKE 1 CAPSULE BY MOUTH DAILY OR AS DIRECTED  . OVER THE COUNTER MEDICATION Take 1 Dose by mouth daily. [place]-  Immune supplement  . OVER THE COUNTER MEDICATION Take 2 each by mouth daily. Collagen gummies  . TURMERIC-GINGER PO Take 2 each by mouth daily. Gummies   No facility-administered encounter medications on file as of 08/13/2020.     Objective:  No results found for: HGBA1C Lab Results  Component Value Date   CREATININE 0.80 02/20/2020   BP Readings from Last 3 Encounters:  08/02/20 139/90    02/20/20 116/88  06/13/19 112/74    Goals Addressed      Patient Stated   .  "slow down the progression of my MS" (pt-stated)   On track     CARE PLAN ENTRY (see longtitudinal plan of care for additional care plan information)  Current Barriers:  [name] Knowledge Deficits related to disease process and Self Health Management of MS  . Chronic Disease Management support and education needs related to Secondary Chronic Progressive MS, Gait disturbance, High Risk Medication, Constipation   Nurse Case Manager Clinical Goal(s):  . 08/13/20 New Over the next 90 days, patient will work with the CCM RN CM to address needs related to disease education and support to improve Self management of MS    CCM RN CM Interventions:  08/13/20 call completed with patient  . Evaluation of current treatment plan related to Multiple Sclerosis and patient's adherence to plan as established by provider . Determined patient completed a recent visit with Neurologist, Dr. Epimenio Foot with the following Assessment/Plan noted:  o ASSESSMENT AND PLAN o Multiple sclerosis (HCC) - Plan: CBC with Differential/Platelet, Thyroid Panel With TSH, Ambulatory referral to Behavioral Health o Lymphedema - Plan: CBC with Differential/Platelet, Thyroid Panel With TSH o Stress - Plan: Ambulatory referral to Behavioral Health o High risk medication use - Plan: CBC with Differential/Platelet, Thyroid Panel With TSH o 1.   Continue Tecfidera.  We will check a CBC with differential to determine if there is  any lymphopenia. o 2.   Continue supplementary vitamin D and B12.[name] o 3.   She will use a walker around the house and wheelchair outside the house. o 4.   Refer to behavioral health.  She reports marital stress and would like to see counseling with her husband.   o 5.   rtc 6 months, sooner if problems  . Determined patient continues to take Tecfidera w/o noted SE, she denies having recent MS exacerbation, however, she does experience muscle  rigidity and leg weakness . Encouraged patient to continue to use her walker at all times to help with balance and ambulation  . Confirmed patient continues to use her walker for balance and ambulation; Confirmed patient has not experienced a fall in the past 6 months . Educated patient on disease progression and encouraged patient to alert Dr. Felecia Shelling promptly of any noted changes in her condition suggestive of MS exacerbation and or disease progression and educated patient on signs/symptoms of such  . Mailed printed educational materials to patient for review and discussion at next call, related to "Exercise and MS"  . Discussed plans with patient for ongoing care management follow up and provided patient with direct contact information for care management team  Patient Self Care Activities:  . Self administers medications as prescribed . Attend all scheduled provider appointments . Call pharmacy for medication refills . Call provider office for new concerns or questions  Please see past updates related to this goal by clicking on the "Past Updates" button in the selected goal      .  COMPLETED: "to better manage my constipation" (pt-stated)        [place] (see longtitudinal plan of care for additional care plan information)  Current Barriers:  [name] Knowledge Deficits related to disease process and Self Health management of Multiple Sclerosis . Chronic Disease Management support and education needs related to Secondary Chronic Progressive MS, Gait disturbance, Constipation, High Risk Medication   Nurse Case Manager Clinical Goal(s):  [name] Over the next 90 days, patient will work with CCM RN CM and PCP  to address needs related to Constipation  Goal Met  . Over the next 90 days, patient will have increased knowledge and understanding about the disease process, evaluation and treatment of Neurogenic Bowel disease secondary to MS Goal Met  CCM RN CM Interventions:  08/13/20 call completed  with patient  . Evaluation of current treatment plan related to MS and patient's adherence to plan as established by provider. . Determined patient has added a Probiotic to her daily regimen with good results as evidence by patient is reporting regular bowel movements since staring this treatment  . Reinforced the importance of drinking plenty of water to help with large bowel absorption to promote regular bowel movements with decreased constipation  . Discussed plans with patient for ongoing care management follow up and provided patient with direct contact information for care management team  Patient Self Care Activities:  . Self administers medications as prescribed . Attend all scheduled provider appointments . Call pharmacy for medication refills . Call provider office for new concerns or questions  Please see past updates related to this goal by clicking on the "Past Updates" button in the selected goal      .  "to improve my skin" (pt-stated)        CARE PLAN ENTRY (see longitudinal plan of care for additional care plan information)  Current Barriers:  [name] Knowledge Deficits related to Self  Health management of poor skin intregrity  . Chronic Disease Management support and education needs related to Secondary Chronic Progressive MS, Gait disturbance, High Risk Medication, Constipation  . Film/video editor.  . Impaired Physical Mobility secondary to Secondary Chronic Progressive MS  Nurse Case Manager Clinical Goal(s):  [name] Over the next 90 days, patient will work with the CCM team and PCP to address needs related to disease education and support for improved Self health management of Impaired Skin Integrity   CCM RN CM Interventions:  08/13/20 call completed with patient  . Inter-disciplinary care team collaboration (see longitudinal plan of care) . Evaluation of current treatment plan related to Impaired Skin Integrity  and patient's adherence to plan as established by  provider . Determined patient completed a visit with the Dermatologist due to experiencing itching, extreme dryness and "raised bumps" on her lower extremities . Provided education to patient re: signs/symptoms of early skin breakdown; Educated on skin breakdown prevention; Educated on importance of notifying the CCM team and or PCP promptly for early treatment; Educated on importance of drinking plenty of water to keep skin well hydrated   . Reviewed medications with patient and discussed Dermatologist prescribed Triamcinolone Acetonide 0.1% apply to effected area twice daily, patient is finding this treatment to be effective   . Discussed plans with patient for ongoing care management follow up and provided patient with direct contact information for care management team  Patient Self Care Activities:  . Self administers medications as prescribed . Attend all scheduled provider appointments . Call pharmacy for medication refills . Call provider office for new concerns or questions  Initial goal documentation       Plan:   Telephone follow up appointment with care management team member scheduled for: 08/29/20  Barb Merino, RN, BSN, CCM Care Management Coordinator [facility]  Direct Phone: [phone]

## 2020-08-13 NOTE — Patient Instructions (Signed)
Visit Information  Goals Addressed      Patient Stated   .  "slow down the progression of my MS" (pt-stated)   On track     [place] (see longtitudinal plan of care for additional care plan information)  Current Barriers:  [name] Knowledge Deficits related to disease process and Self Health Management of MS  . Chronic Disease Management support and education needs related to Secondary Chronic Progressive MS, Gait disturbance, High Risk Medication, Constipation   Nurse Case Manager Clinical Goal(s):  . 08/13/20 New Over the next 90 days, patient will work with the CCM RN CM to address needs related to disease education and support to improve Self management of MS    CCM RN CM Interventions:  08/13/20 call completed with patient  . Evaluation of current treatment plan related to Multiple Sclerosis and patient's adherence to plan as established by provider . Determined patient completed a recent visit with Neurologist, Dr. Felecia Shelling with the following Assessment/Plan noted:  o ASSESSMENT AND PLAN o Multiple sclerosis ([place]) - Plan: CBC with Differential/Platelet, Thyroid Panel With TSH, Ambulatory referral to [place] o Lymphedema - Plan: CBC with Differential/Platelet, Thyroid Panel With TSH o Stress - Plan: Ambulatory referral to [place] o High risk medication use - Plan: CBC with Differential/Platelet, Thyroid Panel With TSH o 1.   Continue Tecfidera.  We will check a CBC with differential to determine if there is any lymphopenia. o 2.   Continue supplementary vitamin D and B12.[name] o 3.   She will use a walker around the house and wheelchair outside the house. o 4.   Refer to behavioral health.  She reports marital stress and would like to see counseling with her husband.   o 5.   rtc 6 months, sooner if problems  . Determined patient continues to take Tecfidera w/o noted SE, she denies having recent MS exacerbation, however, she does experience muscle rigidity and leg  weakness . Encouraged patient to continue to use her walker at all times to help with balance and ambulation  . Confirmed patient continues to use her walker for balance and ambulation; Confirmed patient has not experienced a fall in the past 6 months . Educated patient on disease progression and encouraged patient to alert Dr. Felecia Shelling promptly of any noted changes in her condition suggestive of MS exacerbation and or disease progression and educated patient on signs/symptoms of such  . Mailed printed educational materials to patient for review and discussion at next call, related to "Exercise and MS"  . Discussed plans with patient for ongoing care management follow up and provided patient with direct contact information for care management team  Patient Self Care Activities:  . Self administers medications as prescribed . Attend all scheduled provider appointments . Call pharmacy for medication refills . Call provider office for new concerns or questions  Please see past updates related to this goal by clicking on the "Past Updates" button in the selected goal      .  COMPLETED: "to better manage my constipation" (pt-stated)        [place] (see longtitudinal plan of care for additional care plan information)  Current Barriers:  [name] Knowledge Deficits related to disease process and Self Health management of Multiple Sclerosis . Chronic Disease Management support and education needs related to Secondary Chronic Progressive MS, Gait disturbance, Constipation, High Risk Medication   Nurse Case Manager Clinical Goal(s):  [name] Over the next 90 days, patient will  work with CCM RN CM and PCP  to address needs related to Constipation  Goal Met  . Over the next 90 days, patient will have increased knowledge and understanding about the disease process, evaluation and treatment of Neurogenic Bowel disease secondary to MS Goal Met  CCM RN CM Interventions:  08/13/20 call completed with patient   . Evaluation of current treatment plan related to MS and patient's adherence to plan as established by provider. . Determined patient has added a Probiotic to her daily regimen with good results as evidence by patient is reporting regular bowel movements since staring this treatment  . Reinforced the importance of drinking plenty of water to help with large bowel absorption to promote regular bowel movements with decreased constipation  . Discussed plans with patient for ongoing care management follow up and provided patient with direct contact information for care management team  Patient Self Care Activities:  . Self administers medications as prescribed . Attend all scheduled provider appointments . Call pharmacy for medication refills . Call provider office for new concerns or questions  Please see past updates related to this goal by clicking on the "Past Updates" button in the selected goal      .  "to improve my skin" (pt-stated)        CARE PLAN ENTRY (see longitudinal plan of care for additional care plan information)  Current Barriers:  [name] Knowledge Deficits related to Self Health management of poor skin intregrity  . Chronic Disease Management support and education needs related to Secondary Chronic Progressive MS, Gait disturbance, High Risk Medication, Constipation  . Film/video editor.  . Impaired Physical Mobility secondary to Secondary Chronic Progressive MS  Nurse Case Manager Clinical Goal(s):  [name] Over the next 90 days, patient will work with the CCM team and PCP to address needs related to disease education and support for improved Self health management of Impaired Skin Integrity   CCM RN CM Interventions:  08/13/20 call completed with patient  . Inter-disciplinary care team collaboration (see longitudinal plan of care) . Evaluation of current treatment plan related to Impaired Skin Integrity  and patient's adherence to plan as established by  provider . Determined patient completed a visit with the Dermatologist due to experiencing itching, extreme dryness and "raised bumps" on her lower extremities . Provided education to patient re: signs/symptoms of early skin breakdown; Educated on skin breakdown prevention; Educated on importance of notifying the CCM team and or PCP promptly for early treatment; Educated on importance of drinking plenty of water to keep skin well hydrated   . Reviewed medications with patient and discussed Dermatologist prescribed Triamcinolone Acetonide 0.1% apply to effected area twice daily, patient is finding this treatment to be effective   . Discussed plans with patient for ongoing care management follow up and provided patient with direct contact information for care management team  Patient Self Care Activities:  . Self administers medications as prescribed . Attend all scheduled provider appointments . Call pharmacy for medication refills . Call provider office for new concerns or questions  Initial goal documentation       The patient verbalized understanding of instructions, educational materials, and care plan provided today and declined offer to receive copy of patient instructions, educational materials, and care plan.   Telephone follow up appointment with care management team member scheduled for: 08/29/20  Barb Merino, RN, BSN, CCM Care Management Coordinator [facility]  Direct Phone: [phone]

## 2020-08-15 ENCOUNTER — Telehealth: Payer: Self-pay | Admitting: Nurse Practitioner

## 2020-08-15 NOTE — Telephone Encounter (Signed)
Left message for patient to call back and schedule Medicare Annual Wellness Visit (AWV) either virtually or in office.   Last AWV 06/13/19 ; please schedule at anytime with [facility]    This should be a 45 minute visit.

## 2020-08-21 ENCOUNTER — Other Ambulatory Visit: Payer: Self-pay | Admitting: *Deleted

## 2020-08-21 DIAGNOSIS — G35D Multiple sclerosis, unspecified: Secondary | ICD-10-CM

## 2020-08-21 DIAGNOSIS — G35 Multiple sclerosis: Secondary | ICD-10-CM

## 2020-08-28 ENCOUNTER — Telehealth: Payer: Self-pay

## 2020-08-28 ENCOUNTER — Telehealth: Payer: BC Managed Care – PPO

## 2020-08-28 ENCOUNTER — Other Ambulatory Visit: Payer: Self-pay

## 2020-08-28 NOTE — Telephone Encounter (Cosign Needed)
  Chronic Care Management   Outreach Note  08/28/2020 Name: KEZIAH AVIS MRN: [id number] DOB: 20-Sep-1965  Referred by: [name], FNP Reason for referral : Chronic Care Management (Inbound Call from patient )   RAVINA MILNER is enrolled in a Managed [place]: No  Inbound call from patient requesting instructions for her scheduled telephone visit with with [name], FNP, scheduled for tomorrow am. Successful call completed with patient to advise the office staff will call her preferred phone number on or around the scheduled time tomorrow am. She verbalizes understanding. The patient was referred to the case management team for assistance with care management and care coordination.   Follow Up Plan: Telephone follow up appointment with care management team member scheduled for: 08/29/20  Barb Merino, RN, BSN, CCM  Care Management Coordinator [place] [facility]  Direct Phone: [phone]

## 2020-08-29 ENCOUNTER — Ambulatory Visit (INDEPENDENT_AMBULATORY_CARE_PROVIDER_SITE_OTHER): Payer: BC Managed Care – PPO

## 2020-08-29 ENCOUNTER — Telehealth: Payer: Medicare Other

## 2020-08-29 ENCOUNTER — Encounter: Payer: Self-pay | Admitting: Nurse Practitioner

## 2020-08-29 ENCOUNTER — Ambulatory Visit: Payer: BC Managed Care – PPO

## 2020-08-29 ENCOUNTER — Telehealth (INDEPENDENT_AMBULATORY_CARE_PROVIDER_SITE_OTHER): Payer: BC Managed Care – PPO | Admitting: Nurse Practitioner

## 2020-08-29 VITALS — Wt 290.0 lb

## 2020-08-29 VITALS — Ht 67.0 in | Wt 290.0 lb

## 2020-08-29 DIAGNOSIS — G35 Multiple sclerosis: Secondary | ICD-10-CM

## 2020-08-29 DIAGNOSIS — Z0000 Encounter for general adult medical examination without abnormal findings: Secondary | ICD-10-CM

## 2020-08-29 DIAGNOSIS — R269 Unspecified abnormalities of gait and mobility: Secondary | ICD-10-CM | POA: Diagnosis not present

## 2020-08-29 DIAGNOSIS — G35D Multiple sclerosis, unspecified: Secondary | ICD-10-CM

## 2020-08-29 DIAGNOSIS — R609 Edema, unspecified: Secondary | ICD-10-CM | POA: Diagnosis not present

## 2020-08-29 DIAGNOSIS — R21 Rash and other nonspecific skin eruption: Secondary | ICD-10-CM | POA: Diagnosis not present

## 2020-08-29 MED ORDER — HYDROCHLOROTHIAZIDE 12.5 MG PO TABS: 12.5000 mg | ORAL_TABLET | Freq: Every day | ORAL | 0 refills | Status: DC

## 2020-08-29 NOTE — Progress Notes (Signed)
Virtual Visit via My Chart   This visit type was conducted due to national recommendations for restrictions regarding the COVID-19 Pandemic (e.g. social distancing) in an effort to limit this patient's exposure and mitigate transmission in our community.  Due to her co-morbid illnesses, this patient is at least at moderate risk for complications without adequate follow up.  This format is felt to be most appropriate for this patient at this time.  All issues noted in this document were discussed and addressed.  A limited physical exam was performed with this format.    This visit type was conducted due to national recommendations for restrictions regarding the COVID-19 Pandemic (e.g. social distancing) in an effort to limit this patient's exposure and mitigate transmission in our community.  Patients identity confirmed using two different identifiers.  This format is felt to be most appropriate for this patient at this time.  All issues noted in this document were discussed and addressed.  No physical exam was performed (except for noted visual exam findings with Video Visits).    Date:  08/29/2020   ID:  [name], DOB November 12, 1965, MRN [id number]  Patient Location:  Home - spoke with   Provider location:   Office    Chief Complaint:  Wound to leg  History of Present Illness:    [name] is a 54 y.o. female who presents via video conferencing for a telehealth visit today.    The patient does not have symptoms concerning for COVID-19 infection (fever, chills, cough, or new shortness of breath).   She is having a problem with her legs with fluid which was getting larger, she went to the Dermatology and was treated with some medication - cephalexin - started on Friday. She also had a cream, warm compresses and antibacterial cream. She was having leaking fluid. It is below her knees on both sides.  They have been swelling for some time. Denies shortness of breath.  She feels is  not looking "right".  She has tried a medication she ordered to help with swelling.  She does not drink a lot of water, she tries to avoid salt   She verbalizes she is having difficulty with transportation, she says she can not get in some of her family and friends.      Past Medical History:  Diagnosis Date   Movement disorder    Multiple sclerosis ([place])    Past Surgical History:  Procedure Laterality Date   CESAREAN SECTION     UTERINE FIBROID SURGERY     WISDOM TOOTH EXTRACTION       Current Meds  Medication Sig   B-12 MICROLOZENGE 500 MCG SUBL TAKE 2 TABS BY MOUTH DAILY   cephALEXin (KEFLEX) 500 MG capsule Take 500 mg by mouth 2 (two) times daily.   [employer] B12 QUICK DISSOLVE 500 MCG LOZG TAKE 2 TABS BY MOUTH DAILY   [employer] D3 50 MCG (2000 UT) CAPS TAKE 1 CAPSULE BY MOUTH DAILY OR AS DIRECTED   Probiotic Product (PROBIOTIC PO) Take 1 Dose by mouth daily.   TECFIDERA 240 MG CPDR Take 1 capsule by mouth twice daily   TURMERIC-GINGER PO Take 2 each by mouth daily. Gummies     Allergies:   Food, Oxycodone hcl, and Soy allergy   Social History   Tobacco Use   Smoking status: Never Smoker   Smokeless tobacco: Never Used  Vaping Use   Vaping Use: Never used  Substance Use Topics   Alcohol  use: No   Drug use: No     Family Hx: The patient's family history includes Diabetes in her father; Hypertension in her father and mother.  ROS:   Please see the history of present illness.    Review of Systems  Constitutional: Negative.   Respiratory: Negative.   Cardiovascular: Positive for leg swelling.  Skin: Positive for rash (bilateral legs beneath her knees).  Neurological: Negative.   Psychiatric/Behavioral: Negative.     All other systems reviewed and are negative.   Labs/Other Tests and Data Reviewed:    Recent Labs: 02/20/2020: ALT 23; BUN 11; Creatinine, Ser 0.80; Potassium 3.9; Sodium 139 08/02/2020: Hemoglobin 15.4; Platelets 310; TSH 4.120   Recent  Lipid Panel No results found for: CHOL, TRIG, HDL, CHOLHDL, LDLCALC, LDLDIRECT  Wt Readings from Last 3 Encounters:  08/29/20 290 lb (131.5 kg)  08/29/20 290 lb (131.5 kg)  08/02/20 296 lb 8 oz (134.5 kg)     Exam:    Vital Signs:  Wt 290 lb (131.5 kg)    BMI 45.42 kg/m     Physical Exam Vitals reviewed.  Constitutional:      General: She is not in acute distress.    Appearance: Normal appearance. She is obese.  Cardiovascular:     Rate and Rhythm: Normal rate and regular rhythm.  Pulmonary:     Effort: Pulmonary effort is normal. No respiratory distress.  Musculoskeletal:     Right lower leg: Edema present.     Left lower leg: Edema present.  Skin:    Findings: Rash (she has fluid filled vesicles to her bilateral legs, she also has scabbed areas to her skin) present.     Comments: She has dry scabbed skin present to left lower extremity, few fluid filled vesicles  Neurological:     General: No focal deficit present.     Mental Status: She is alert and oriented to person, place, and time.     Cranial Nerves: No cranial nerve deficit.  Psychiatric:        Mood and Affect: Mood normal.        Behavior: Behavior normal.        Thought Content: Thought content normal.        Judgment: Judgment normal.     ASSESSMENT & PLAN:    I will refer her to remote health however she does not meet criteria  1. Edema, unspecified type Bilateral lower extremities, she is encouraged to wear support socks during the day and elevate when possible  2. Rash and nonspecific skin eruption May be related to swelling  Will try HCTZ for four days to see if improves. - hydrochlorothiazide (HYDRODIURIL) 12.5 MG tablet; Take 1 tablet (12.5 mg total) by mouth daily.  Dispense: 30 tablet; Refill: 0  3. Multiple sclerosis (HCC) [place], continue follow up with Neurology Minimal movement  4. Gait disturbance  She walks a limited amount of time  She does not want to use SCAT due to her own  personal reasons  I stressed to her it is important to have routine follow up   COVID-19 Education: The signs and symptoms of COVID-19 were discussed with the patient and how to seek care for testing (follow up with PCP or arrange E-visit).  The importance of social distancing was discussed today.  Patient Risk:   After full review of this patients clinical status, I feel that they are at least moderate risk at this time.  Time:   Today, I  have spent 20 minutes/ seconds with the patient with telehealth technology discussing above diagnoses.     Medication Adjustments/Labs and Tests Ordered: Current medicines are reviewed at length with the patient today.  Concerns regarding medicines are outlined above.   Tests Ordered: No orders of the defined types were placed in this encounter.   Medication Changes: Meds ordered this encounter  Medications   hydrochlorothiazide (HYDRODIURIL) 12.5 MG tablet    Sig: Take 1 tablet (12.5 mg total) by mouth daily.    Dispense:  30 tablet    Refill:  0    Disposition:  Follow up prn  Signed, [name], FNP

## 2020-08-29 NOTE — Patient Instructions (Signed)
[name] , Thank you for taking time to come for your Medicare Wellness Visit. I appreciate your ongoing commitment to your health goals. Please review the following plan we discussed and let me know if I can assist you in the future.   Screening recommendations/referrals: Colonoscopy: cologuard 07/03/2020, due 07/04/2023 Mammogram: patient to schedule Bone Density: n/a Recommended yearly ophthalmology/optometry visit for glaucoma screening and checkup Recommended yearly dental visit for hygiene and checkup  Vaccinations: Influenza vaccine: due Pneumococcal vaccine: n/a Tdap vaccine: due Shingles vaccine: discussed  Covid-19:  03/13/2020, 02/02/2020  Advanced directives: Advance directive discussed with you today.   Conditions/risks identified: none  Next appointment: Follow up in one year for your annual wellness visit.   Preventive Care 40-64 Years, Female Preventive care refers to lifestyle choices and visits with your health care provider that can promote health and wellness. What does preventive care include?  A yearly physical exam. This is also called an annual well check.  Dental exams once or twice a year.  Routine eye exams. Ask your health care provider how often you should have your eyes checked.  Personal lifestyle choices, including:  Daily care of your teeth and gums.  Regular physical activity.  Eating a healthy diet.  Avoiding tobacco and drug use.  Limiting alcohol use.  Practicing safe sex.  Taking low-dose aspirin daily starting at age 68.  Taking vitamin and mineral supplements as recommended by your health care provider. What happens during an annual well check? The services and screenings done by your health care provider during your annual well check will depend on your age, overall health, lifestyle risk factors, and family history of disease. Counseling  Your health care provider may ask you questions about your:  Alcohol  use.  Tobacco use.  Drug use.  Emotional well-being.  Home and relationship well-being.  Sexual activity.  Eating habits.  Work and work Statistician.  Method of birth control.  Menstrual cycle.  Pregnancy history. Screening  You may have the following tests or measurements:  Height, weight, and BMI.  Blood pressure.  Lipid and cholesterol levels. These may be checked every 5 years, or more frequently if you are over 29 years old.  Skin check.  Lung cancer screening. You may have this screening every year starting at age 27 if you have a 30-pack-year history of smoking and currently smoke or have quit within the past 15 years.  Fecal occult blood test (FOBT) of the stool. You may have this test every year starting at age 63.  Flexible sigmoidoscopy or colonoscopy. You may have a sigmoidoscopy every 5 years or a colonoscopy every 10 years starting at age 1.  Hepatitis C blood test.  Hepatitis B blood test.  Sexually transmitted disease (STD) testing.  Diabetes screening. This is done by checking your blood sugar (glucose) after you have not eaten for a while (fasting). You may have this done every 1-3 years.  Mammogram. This may be done every 1-2 years. Talk to your health care provider about when you should start having regular mammograms. This may depend on whether you have a family history of breast cancer.  BRCA-related cancer screening. This may be done if you have a family history of breast, ovarian, tubal, or peritoneal cancers.  Pelvic exam and Pap test. This may be done every 3 years starting at age 6. Starting at age 7, this may be done every 5 years if you have a Pap test in combination with an HPV test.  Bone density scan. This is done to screen for osteoporosis. You may have this scan if you are at high risk for osteoporosis. Discuss your test results, treatment options, and if necessary, the need for more tests with your health care  provider. Vaccines  Your health care provider may recommend certain vaccines, such as:  Influenza vaccine. This is recommended every year.  Tetanus, diphtheria, and acellular pertussis (Tdap, Td) vaccine. You may need a Td booster every 10 years.  Zoster vaccine. You may need this after age 30.  Pneumococcal 13-valent conjugate (PCV13) vaccine. You may need this if you have certain conditions and were not previously vaccinated.  Pneumococcal polysaccharide (PPSV23) vaccine. You may need one or two doses if you smoke cigarettes or if you have certain conditions. Talk to your health care provider about which screenings and vaccines you need and how often you need them. This information is not intended to replace advice given to you by your health care provider. Make sure you discuss any questions you have with your health care provider. Document Released: 09/28/2015 Document Revised: 05/21/2016 Document Reviewed: 07/03/2015 Elsevier Interactive Patient Education  2017 [place] Prevention in the Home Falls can cause injuries. They can happen to people of all ages. There are many things you can do to make your home safe and to help prevent falls. What can I do on the outside of my home?  Regularly fix the edges of walkways and driveways and fix any cracks.  Remove anything that might make you trip as you walk through a door, such as a raised step or threshold.  Trim any bushes or trees on the path to your home.  Use bright outdoor lighting.  Clear any walking paths of anything that might make someone trip, such as rocks or tools.  Regularly check to see if handrails are loose or broken. Make sure that both sides of any steps have handrails.  Any raised decks and porches should have guardrails on the edges.  Have any leaves, snow, or ice cleared regularly.  Use sand or salt on walking paths during winter.  Clean up any spills in your garage right away. This includes  oil or grease spills. What can I do in the bathroom?  Use night lights.  Install grab bars by the toilet and in the tub and shower. Do not use towel bars as grab bars.  Use non-skid mats or decals in the tub or shower.  If you need to sit down in the shower, use a plastic, non-slip stool.  Keep the floor dry. Clean up any water that spills on the floor as soon as it happens.  Remove soap buildup in the tub or shower regularly.  Attach bath mats securely with double-sided non-slip rug tape.  Do not have throw rugs and other things on the floor that can make you trip. What can I do in the bedroom?  Use night lights.  Make sure that you have a light by your bed that is easy to reach.  Do not use any sheets or blankets that are too big for your bed. They should not hang down onto the floor.  Have a firm chair that has side arms. You can use this for support while you get dressed.  Do not have throw rugs and other things on the floor that can make you trip. What can I do in the kitchen?  Clean up any spills right away.  Avoid walking on  wet floors.  Keep items that you use a lot in easy-to-reach places.  If you need to reach something above you, use a strong step stool that has a grab bar.  Keep electrical cords out of the way.  Do not use floor polish or wax that makes floors slippery. If you must use wax, use non-skid floor wax.  Do not have throw rugs and other things on the floor that can make you trip. What can I do with my stairs?  Do not leave any items on the stairs.  Make sure that there are handrails on both sides of the stairs and use them. Fix handrails that are broken or loose. Make sure that handrails are as long as the stairways.  Check any carpeting to make sure that it is firmly attached to the stairs. Fix any carpet that is loose or worn.  Avoid having throw rugs at the top or bottom of the stairs. If you do have throw rugs, attach them to the floor  with carpet tape.  Make sure that you have a light switch at the top of the stairs and the bottom of the stairs. If you do not have them, ask someone to add them for you. What else can I do to help prevent falls?  Wear shoes that:  Do not have high heels.  Have rubber bottoms.  Are comfortable and fit you well.  Are closed at the toe. Do not wear sandals.  If you use a stepladder:  Make sure that it is fully opened. Do not climb a closed stepladder.  Make sure that both sides of the stepladder are locked into place.  Ask someone to hold it for you, if possible.  Clearly mark and make sure that you can see:  Any grab bars or handrails.  First and last steps.  Where the edge of each step is.  Use tools that help you move around (mobility aids) if they are needed. These include:  Canes.  Walkers.  Scooters.  Crutches.  Turn on the lights when you go into a dark area. Replace any light bulbs as soon as they burn out.  Set up your furniture so you have a clear path. Avoid moving your furniture around.  If any of your floors are uneven, fix them.  If there are any pets around you, be aware of where they are.  Review your medicines with your doctor. Some medicines can make you feel dizzy. This can increase your chance of falling. Ask your doctor what other things that you can do to help prevent falls. This information is not intended to replace advice given to you by your health care provider. Make sure you discuss any questions you have with your health care provider. Document Released: 06/28/2009 Document Revised: 02/07/2016 Document Reviewed: 10/06/2014 Elsevier Interactive Patient Education  2017 [employer].

## 2020-08-29 NOTE — Patient Instructions (Signed)
  Goals we discussed today:  Goals Addressed              This Visit's Progress   Other   .  Home modifications        Timeframe:  Long-Range Goal Priority:  Medium Start Date:   12.15.21                          Expected End Date:  4.14.22                     Next date of contact: 2.3.22  Patient Goals/Self-Care Activities Over the next 60 days, patient will:   - Patient will self administer medications as prescribed Patient will attend all scheduled provider appointments Patient will call provider office for new concerns or questions Engage with [facility] as needed regarding home modifications Contact SW as needed prior to next scheduled call    .  Receive care for skin condition        Timeframe:  Short-Term Goal Priority:  High Start Date:  12.15.21                           Expected End Date: 1.14.22                      Next date of contact: 12.30.21  Patient Goals/Self-Care Activities Over the next 14 days, patient will:   - Patient will self administer medications as prescribed Patient will attend all scheduled provider appointments Patient will call provider office for new concerns or questions Engage with [facility] for ongoing evaluation and treatment  Contact SW as needed prior to next scheduled call

## 2020-08-29 NOTE — Progress Notes (Signed)
I connected with [name] today by telephone and verified that I am speaking with the correct person using two identifiers. Location patient: home Location provider: work Persons participating in the virtual visit: [name], [name].   I discussed the limitations, risks, security and privacy concerns of performing an evaluation and management service by telephone and the availability of in person appointments. I also discussed with the patient that there may be a patient responsible charge related to this service. The patient expressed understanding and verbally consented to this telephonic visit.    Interactive audio and video telecommunications were attempted between this provider and patient, however failed, due to patient having technical difficulties OR patient did not have access to video capability.  We continued and completed visit with audio only.     Vital signs may be patient reported or missing.  Subjective:   [name] is a 54 y.o. female who presents for Medicare Annual (Subsequent) preventive examination.  Review of Systems     Cardiac Risk Factors include: obesity (BMI >30kg/m2);sedentary lifestyle     Objective:    Today's Vitals   08/29/20 0912 08/29/20 0913  Weight: 290 lb (131.5 kg)   Height: 5' 7"  (1.702 m)   PainSc:  5    Body mass index is 45.42 kg/m.  Advanced Directives 08/29/2020 06/13/2019 10/28/2018 10/28/2018  Does Patient Have a Medical Advance Directive? No No No No  Would patient like information on creating a medical advance directive? - Yes (MAU/Ambulatory/Procedural Areas - Information given) No - Patient declined -    Current Medications (verified) Outpatient Encounter Medications as of 08/29/2020  Medication Sig  . B-12 MICROLOZENGE 500 MCG SUBL TAKE 2 TABS BY MOUTH DAILY  . [employer] B12 QUICK DISSOLVE 500 MCG LOZG TAKE 2 TABS BY MOUTH DAILY  . [employer] D3 50 MCG (2000 UT) CAPS TAKE 1 CAPSULE BY MOUTH DAILY OR AS  DIRECTED  . Probiotic Product (PROBIOTIC PO) Take 1 Dose by mouth daily.  . TECFIDERA 240 MG CPDR Take 1 capsule by mouth twice daily  . TURMERIC-GINGER PO Take 2 each by mouth daily. Gummies  . OVER THE COUNTER MEDICATION Take 1 Dose by mouth daily. [place]-  Immune supplement (Patient not taking: Reported on 08/29/2020)  . OVER THE COUNTER MEDICATION Take 2 each by mouth daily. Collagen gummies (Patient not taking: Reported on 08/29/2020)  . triamcinolone (KENALOG) 0.1 % Apply 1 application topically 2 (two) times daily. (Patient not taking: Reported on 08/29/2020)   No facility-administered encounter medications on file as of 08/29/2020.    Allergies (verified) Food, Oxycodone hcl, and Soy allergy   History: Past Medical History:  Diagnosis Date  . Movement disorder   . Multiple sclerosis ([place])    Past Surgical History:  Procedure Laterality Date  . CESAREAN SECTION    . UTERINE FIBROID SURGERY    . WISDOM TOOTH EXTRACTION     Family History  Problem Relation Age of Onset  . Hypertension Mother   . Hypertension Father   . Diabetes Father    Social History   Socioeconomic History  . Marital status: Married    Spouse name: Not on file  . Number of children: Not on file  . Years of education: Not on file  . Highest education level: Not on file  Occupational History  . Not on file  Tobacco Use  . Smoking status: Never Smoker  . Smokeless tobacco: Never Used  Vaping Use  . Vaping Use:  Never used  Substance and Sexual Activity  . Alcohol use: No  . Drug use: No  . Sexual activity: Not on file  Other Topics Concern  . Not on file  Social History Narrative  . Not on file   Social Determinants of Health   Financial Resource Strain: Low Risk   . Difficulty of Paying Living Expenses: Not very hard  Food Insecurity: No Food Insecurity  . Worried About Charity fundraiser in the Last Year: Never true  . Ran Out of Food in the Last Year: Never true   Transportation Needs: No Transportation Needs  . Lack of Transportation (Medical): No  . Lack of Transportation (Non-Medical): No  Physical Activity: Inactive  . Days of Exercise per Week: 0 days  . Minutes of Exercise per Session: 0 min  Stress: No Stress Concern Present  . Feeling of Stress : Not at all  Social Connections: Not on file    Tobacco Counseling Counseling given: Not Answered   Clinical Intake:  Pre-visit preparation completed: Yes  Pain : 0-10 Pain Score: 5  Pain Type: Chronic pain Pain Location: Leg Pain Orientation: Left,Right Pain Descriptors / Indicators: Discomfort Pain Onset: More than a month ago Pain Frequency: Constant Pain Relieving Factors: cream  Pain Relieving Factors: cream  Nutritional Status: BMI > 30  Obese Nutritional Risks: None Diabetes: No  How often do you need to have someone help you when you read instructions, pamphlets, or other written materials from your doctor or pharmacy?: 1 - Never What is the last grade level you completed in school?: college  Diabetic? no  Interpreter Needed?: No  Information entered by :: [name]   Activities of Daily Living In your present state of health, do you have any difficulty performing the following activities: 08/29/2020 02/20/2020  Hearing? N N  Vision? N N  Difficulty concentrating or making decisions? N N  Walking or climbing stairs? Y Y  Dressing or bathing? Y Y  Doing errands, shopping? [name]  Preparing Food and eating ? Y -  Using the Toilet? N -  In the past six months, have you accidently leaked urine? Y -  Do you have problems with loss of bowel control? Y -  Managing your Medications? N -  Managing your Finances? N -  Housekeeping or managing your Housekeeping? Y -  Some recent data might be hidden    Patient Care Team: Christina Brine, FNP as PCP - General ([place]) [name], Christina Stapler, RN as Case Manager [name] as Social Worker  Indicate any recent  [place] you may have received from other than [facility] providers in the past year (date may be approximate).     Assessment:   This is a routine wellness examination for [name].  Hearing/Vision screen  Hearing Screening   125Hz  250Hz  500Hz  1000Hz  2000Hz  3000Hz  4000Hz  6000Hz  8000Hz   Right ear:           Left ear:           Vision Screening Comments: No regular eye exams  Dietary issues and exercise activities discussed: Current Exercise Habits: The patient does not participate in regular exercise at present  Goals    .  "I need to have my bathroom remodeled" (pt-stated)      [place] (see longitudinal plan of care for additional care plan information)  Current Barriers:  [name] Knowledge Deficits related to community resources to assist with home modification for bathroom remodel  .  Chronic Disease Management support and education needs related to Secondary Chronic Progressive MS, Gait disturbance, High Risk Medication, Constipation   Nurse Case Manager Clinical Goal(s):  [name] Over the next 90 days, patient will work with the embedded BSW [name]  to address needs related to resources that may assist with home modification such as bathroom remodel  Goal Met . New 06/06/20- Over the next 30 days the patient will complete intake packet provided by [place] in regards to home modification needs Goal Met . New 07/10/20 Over the next 120 days the patient will work with [facility] regarding home modification needs  CCM SW Interventions: Completed 07/10/20 . Successful outbound call placed to the patient to assess goal progression . Determined the patient spoke with [facility] approximately 1 week ago to confirm patient packet and physician paperwork had been received . Patient reports she is awaiting to hear when the engineer will come out to the home . Scheduled follow up call over the next 60 days o Encouraged the patient to contact SW as  needed prior to next scheduled call  Completed 06/06/20 . Successful outbound call placed to the patient to assess goal progression . Determined the patient was contacted by [facility] to complete a phone intake call . The patient has received intake paperwork via mail and is in the process of completing . Advised the patient home modification under this program may take several months due to it being a state funded program  . Scheduled follow up call over the next month  Completed 05/24/20 . Successful outbound call placed to the patient to inform of referral placed to [facility] . Advised the patient to expect a call from [name] to complete intake . Scheduled follow up over the next two weeks to assess goal progression  Completed 05/23/20 . Inbound call received from [name] with [facility] to complete referral o [name] reports she will plan to outreach the patient to complete intake for bathroom modification and chair lift adjustment . SW will follow up with the patient on 05/24/20 as previously scheduled  Completed 05/18/20 . Successful outbound call placed to the patient to determine home modification needs o The patient reports her bathroom downstairs is a half bath which she would like to expand to add a walk in shower o The patient reports she has a stair lift but experiences difficulty accessing it on her own due to placement- the patient would like a wall re-figured to support independent use . Discussed [facility] o Patient reports interest in pursuing assistance from program o Advised the patient SW is unsure of program limitations surrounding modifications . Collaboration with Estanislado Pandy and [name] to inquire if home modification needs can be addressed under [place] program . Scheduled follow up call to the patient over the next week  CCM RN CM Interventions:  05/09/20 call  completed with patient  . Inter-disciplinary care team collaboration (see longitudinal plan of care) . Determined patient is unable to bathe in her lower level bathroom due to needing a walk in shower . Collaborated with embedded [place] regarding resources requested that assist with a bathroom remodel  . Discussed plans with patient for ongoing care management follow up and provided patient with direct contact information for care management team  Patient Self Care Activities:  . Self administers medications as prescribed . Attends all scheduled provider appointments . Calls pharmacy for  medication refills . Performs ADL's independently . Performs IADL's independently . Calls provider office for new concerns or questions  Please see past updates related to this goal by clicking on the "Past Updates" button in the selected goal      .  "slow down the progression of my MS" (pt-stated)      [place] (see longtitudinal plan of care for additional care plan information)  Current Barriers:  [name] Knowledge Deficits related to disease process and Self Health Management of MS  . Chronic Disease Management support and education needs related to Secondary Chronic Progressive MS, Gait disturbance, High Risk Medication, Constipation   Nurse Case Manager Clinical Goal(s):  . 08/13/20 New Over the next 90 days, patient will work with the CCM RN CM to address needs related to disease education and support to improve Self management of MS    CCM RN CM Interventions:  08/13/20 call completed with patient  . Evaluation of current treatment plan related to Multiple Sclerosis and patient's adherence to plan as established by provider . Determined patient completed a recent visit with Neurologist, Dr. Felecia Shelling with the following Assessment/Plan noted:  o ASSESSMENT AND PLAN o Multiple sclerosis ([place]) - Plan: CBC with Differential/Platelet, Thyroid Panel With TSH, Ambulatory referral to  [place] o Lymphedema - Plan: CBC with Differential/Platelet, Thyroid Panel With TSH o Stress - Plan: Ambulatory referral to [place] o High risk medication use - Plan: CBC with Differential/Platelet, Thyroid Panel With TSH o 1.   Continue Tecfidera.  We will check a CBC with differential to determine if there is any lymphopenia. o 2.   Continue supplementary vitamin D and B12.[name] o 3.   She will use a walker around the house and wheelchair outside the house. o 4.   Refer to behavioral health.  She reports marital stress and would like to see counseling with her husband.   o 5.   rtc 6 months, sooner if problems  . Determined patient continues to take Tecfidera w/o noted SE, she denies having recent MS exacerbation, however, she does experience muscle rigidity and leg weakness . Encouraged patient to continue to use her walker at all times to help with balance and ambulation  . Confirmed patient continues to use her walker for balance and ambulation; Confirmed patient has not experienced a fall in the past 6 months . Educated patient on disease progression and encouraged patient to alert Dr. Felecia Shelling promptly of any noted changes in her condition suggestive of MS exacerbation and or disease progression and educated patient on signs/symptoms of such  . Mailed printed educational materials to patient for review and discussion at next call, related to "Exercise and MS"  . Discussed plans with patient for ongoing care management follow up and provided patient with direct contact information for care management team  Patient Self Care Activities:  . Self administers medications as prescribed . Attend all scheduled provider appointments . Call pharmacy for medication refills . Call provider office for new concerns or questions  Please see past updates related to this goal by clicking on the "Past Updates" button in the selected goal      .  "to improve my skin" (pt-stated)      CARE  PLAN ENTRY (see longitudinal plan of care for additional care plan information)  Current Barriers:  [name] Knowledge Deficits related to Self Health management of poor skin intregrity  . Chronic Disease Management support and education needs related to Secondary Chronic Progressive  MS, Gait disturbance, High Risk Medication, Constipation  . Film/video editor.  . Impaired Physical Mobility secondary to Secondary Chronic Progressive MS  Nurse Case Manager Clinical Goal(s):  [name] Over the next 90 days, patient will work with the CCM team and PCP to address needs related to disease education and support for improved Self health management of Impaired Skin Integrity   CCM RN CM Interventions:  08/13/20 call completed with patient  . Inter-disciplinary care team collaboration (see longitudinal plan of care) . Evaluation of current treatment plan related to Impaired Skin Integrity  and patient's adherence to plan as established by provider . Determined patient completed a visit with the Dermatologist due to experiencing itching, extreme dryness and "raised bumps" on her lower extremities . Provided education to patient re: signs/symptoms of early skin breakdown; Educated on skin breakdown prevention; Educated on importance of notifying the CCM team and or PCP promptly for early treatment; Educated on importance of drinking plenty of water to keep skin well hydrated   . Reviewed medications with patient and discussed Dermatologist prescribed Triamcinolone Acetonide 0.1% apply to effected area twice daily, patient is finding this treatment to be effective   . Discussed plans with patient for ongoing care management follow up and provided patient with direct contact information for care management team  Patient Self Care Activities:  . Self administers medications as prescribed . Attend all scheduled provider appointments . Call pharmacy for medication refills . Call provider office for new concerns or  questions  Initial goal documentation     .  Increase physical activity      "would like to be able to walk without her walker"    .  Patient Stated      08/29/2020, get stronger in lower body and walk with a cane      Depression Screen PHQ 2/9 Scores 08/29/2020 02/20/2020 06/13/2019  PHQ - 2 Score 0 0 0    Fall Risk Fall Risk  08/29/2020 02/20/2020 06/13/2019 04/23/2017  Falls in the past year? 0 1 1 Yes  Number falls in past yr: - 0 0 2 or more  Comment - - - 5 falls  Injury with Fall? - 0 0 No  Risk for fall due to : Impaired balance/gait;Impaired mobility - - -  Follow up Falls evaluation completed;Education provided;Falls prevention discussed - - -    FALL RISK PREVENTION PERTAINING TO THE HOME:  Any stairs in or around the home? Yes  If so, are there any without handrails? No  Home free of loose throw rugs in walkways, pet beds, electrical cords, etc? Yes  Adequate lighting in your home to reduce risk of falls? Yes   ASSISTIVE DEVICES UTILIZED TO PREVENT FALLS:  Life alert? Yes  Use of a cane, walker or w/c? Yes  Grab bars in the bathroom? Yes  Shower chair or bench in shower? Yes  Elevated toilet seat or a handicapped toilet? No   TIMED UP AND GO:  Was the test performed? No .  Cognitive Function:     6CIT Screen 08/29/2020 06/13/2019  What Year? 0 points 0 points  What month? 0 points 0 points  What time? 0 points 0 points  Count back from 20 2 points 0 points  Months in reverse 0 points 0 points  Repeat phrase 0 points 4 points  Total Score 2 4    Immunizations Immunization History  Administered Date(s) Administered  . Influenza,inj,Quad PF,6+ Mos 06/21/2019  . PFIZER SARS-COV-2 Vaccination 02/02/2020,  03/13/2020    TDAP status: Due, Education has been provided regarding the importance of this vaccine. Advised may receive this vaccine at local pharmacy or Health Dept. Aware to provide a copy of the vaccination record if obtained from local pharmacy or  Health Dept. Verbalized acceptance and understanding.  Flu Vaccine status: Due, Education has been provided regarding the importance of this vaccine. Advised may receive this vaccine at local pharmacy or Health Dept. Aware to provide a copy of the vaccination record if obtained from local pharmacy or Health Dept. Verbalized acceptance and understanding.  Pneumococcal vaccine status: Up to date  Covid-19 vaccine status: Completed vaccines  Qualifies for Shingles Vaccine? Yes   Zostavax completed No   Shingrix Completed?: No.    Education has been provided regarding the importance of this vaccine. Patient has been advised to call insurance company to determine out of pocket expense if they have not yet received this vaccine. Advised may also receive vaccine at local pharmacy or Health Dept. Verbalized acceptance and understanding.  Screening Tests Health Maintenance  Topic Date Due  . TETANUS/TDAP  Never done  . PAP SMEAR-Modifier  Never done  . COLONOSCOPY  Never done  . MAMMOGRAM  08/22/2016  . INFLUENZA VACCINE  04/15/2020  . COVID-19 Vaccine (3 - Booster for Pfizer series) 09/12/2020  . Hepatitis C Screening  Completed  . HIV Screening  Completed    Health Maintenance  Health Maintenance Due  Topic Date Due  . TETANUS/TDAP  Never done  . PAP SMEAR-Modifier  Never done  . COLONOSCOPY  Never done  . MAMMOGRAM  08/22/2016  . INFLUENZA VACCINE  04/15/2020    Colorectal cancer screening: Type of screening: Cologuard. Completed 07/03/2020. Repeat every 3 years  Mammogram status: patient to reschedule  Bone Density status: n/a  Lung Cancer Screening: (Low Dose CT Chest recommended if Age 69-80 years, 30 pack-year currently smoking OR have quit w/in 15years.) does not qualify.   Lung Cancer Screening Referral: no  Additional Screening:  Hepatitis C Screening: does qualify; Completed 02/20/2020  Vision Screening: Recommended annual ophthalmology exams for early detection of  glaucoma and other disorders of the eye. Is the patient up to date with their annual eye exam?  No  Who is the provider or what is the name of the office in which the patient attends annual eye exams? none If pt is not established with a provider, would they like to be referred to a provider to establish care? No .   Dental Screening: Recommended annual dental exams for proper oral hygiene  Community Resource Referral / Chronic Care Management: CRR required this visit?  No   CCM required this visit?  No      Plan:     I have personally reviewed and noted the following in the patient's chart:   . Medical and social history . Use of alcohol, tobacco or illicit drugs  . Current medications and supplements . Functional ability and status . Nutritional status . Physical activity . Advanced directives . List of other physicians . Hospitalizations, surgeries, and ER visits in previous 12 months . Vitals . Screenings to include cognitive, depression, and falls . Referrals and appointments  In addition, I have reviewed and discussed with patient certain preventive protocols, quality metrics, and best practice recommendations. A written personalized care plan for preventive services as well as general preventive health recommendations were provided to patient.     [name], York   [id number]   Nurse  Notes:

## 2020-08-29 NOTE — Chronic Care Management (AMB) (Signed)
Care Management    Social Work Follow Up Note  08/29/2020 Name: [name] MRN: [id number] DOB: 04-Mar-1966  [name] Lien is a 54 y.o. year old female who is a primary care patient of [name], [place]. The CCM team was consulted for assistance with care coordination.   Review of patient status, including review of consultants reports, other relevant assessments, and collaboration with appropriate care team members and the patient's provider was performed as part of comprehensive patient evaluation and provision of chronic care management services.    SDOH (Social Determinants of Health) assessments performed: No    Outpatient Encounter Medications as of 08/29/2020  Medication Sig  . B-12 MICROLOZENGE 500 MCG SUBL TAKE 2 TABS BY MOUTH DAILY  . CVS B12 QUICK DISSOLVE 500 MCG LOZG TAKE 2 TABS BY MOUTH DAILY  . CVS D3 50 MCG (2000 UT) CAPS TAKE 1 CAPSULE BY MOUTH DAILY OR AS DIRECTED  . hydrochlorothiazide (HYDRODIURIL) 12.5 MG tablet Take 1 tablet (12.5 mg total) by mouth daily.  . Probiotic Product (PROBIOTIC PO) Take 1 Dose by mouth daily.  . TECFIDERA 240 MG CPDR Take 1 capsule by mouth twice daily  . TURMERIC-GINGER PO Take 2 each by mouth daily. Gummies  . [DISCONTINUED] OVER THE COUNTER MEDICATION Take 1 Dose by mouth daily. [place]-  Immune supplement (Patient not taking: No sig reported)  . [DISCONTINUED] OVER THE COUNTER MEDICATION Take 2 each by mouth daily. Collagen gummies (Patient not taking: No sig reported)  . [DISCONTINUED] triamcinolone (KENALOG) 0.1 % Apply 1 application topically 2 (two) times daily. (Patient not taking: No sig reported)   No facility-administered encounter medications on file as of 08/29/2020.     Patient Care Plan: Social Work Care Plan    Problem Identified: Care Coordination   Priority: Medium  Onset Date: 08/29/2020    Goal: Follow up with primary care team regarding skin condition   Start Date: 08/29/2020  Expected End Date:  09/28/2020  This Visit's Progress: On track  Priority: High  Note:   Current Barriers:  . Difficulty contacting dermatologist  . Ongoing skin condition that has began oozing and is possibly spreading per patient report . Chronic conditions including Multiple Sclerosis and Vitamin B12 Deficiency which puts patient at increased risk of hospitalization  Social Work Clinical Goal(s):  [name] Over the next 2 days, patient will follow up with Primary care provider as directed by SW  Interventions: . 1:1 collaboration with [name], [place] regarding development and update of comprehensive plan of care as evidenced by provider attestation and co-signature . Inter-disciplinary care team collaboration (see longitudinal plan of care) . Successful outbound call placed to the patient to assess for care coordination needs . Determined the patient has been experiencing a skin condition on her leg which she has been addressing with a dermatologist . Patient reports a bump has opened and is actively oozing. The patient stated she has been using tissue to cover and has to change 3-4 times each day due to amount of drainage. The patient reports she attempted to contact dermatologist x 2 but has yet to receive a return call. "I will go to urgent care on Friday if they do not see me before then" . Advised the patient SW would contact Smitty Knudsen and follow up with the patient later today . Collaboration with [place] to discuss above patient reported concerns - discussed patient may need to be seen by primary provider .  Collaboration with [name], FNP to report patient self reported concerns - discussed patient may benefit from [facility] for wound care and follow up  . Scheduled virtual appointment with Mrs. Moore for evaluation . Communication received from Mrs. Moore indicating visit completed and referral would be placed to [facility] . Scheduled follow up call over  the next 14 days  Patient Goals/Self-Care Activities Over the next 14 days, patient will:   - Patient will self administer medications as prescribed Patient will attend all scheduled provider appointments Patient will call provider office for new concerns or questions Engage with [facility] for ongoing evaluation and treatment  Contact SW as needed prior to next scheduled call  Follow up Plan: SW will follow up with patient by phone over the next 14 days    Long-Range Goal: Home modifications   Start Date: 08/29/2020  Expected End Date: 12/27/2020  This Visit's Progress: On track  Priority: Medium  Note:   Current Barriers:  . Financial constraints related to cost of home modifications  . ADL IADL limitations - unable to access bathroom independently due to modifications needed including walk-in shower and wider doorframes  . Chronic conditions including Multiple Sclerosis and Vitamin B12 Deficiency which put patient at increased risk of hospitalization  Social Work Clinical Goal(s):  [name] Over the next 120 days, patient will work with [facility] to address needs related to home modifications  Interventions: . 1:1 collaboration with [name], [place] regarding development and update of comprehensive plan of care as evidenced by provider attestation and co-signature . Inter-disciplinary care team collaboration (see longitudinal plan of care) . Successful outbound call placed to the patient to assess goal progression . Determined the patient is still awaiting a visit with an engineer to determine types of repair needs . Discussed the patient has left voice messages for [facility] requesting updates but has yet to get a return call . Advised the patient to continue advocating for herself while keeping in mind the amount of steps and time she may be waiting prior to receiving modifications  Patient Goals/Self-Care Activities Over the next 60 days,  patient will:   - Patient will self administer medications as prescribed Patient will attend all scheduled provider appointments Patient will call provider office for new concerns or questions Engage with [facility] as needed regarding home modifications Contact SW as needed prior to next scheduled call  Follow up Plan: SW will follow up with patient by phone over the next 45 days        Follow Up Plan: SW will follow up with patient by phone over the next 14 days.   Daneen Schick, BSW, CDP Social Worker, Certified Dementia Practitioner [place] [facility] [phone]

## 2020-09-09 ENCOUNTER — Other Ambulatory Visit: Payer: Self-pay

## 2020-09-09 ENCOUNTER — Ambulatory Visit
Admission: EM | Admit: 2020-09-09 | Discharge: 2020-09-09 | Disposition: A | Discharge status: Home / Self Care | Payer: Medicare Other | Attending: Emergency Medicine | Admitting: Emergency Medicine

## 2020-09-09 DIAGNOSIS — R600 Localized edema: Secondary | ICD-10-CM

## 2020-09-09 MED ORDER — DOXYCYCLINE HYCLATE 100 MG PO CAPS: 100.0000 mg | ORAL_CAPSULE | Freq: Two times a day (BID) | ORAL | 0 refills | Status: AC

## 2020-09-09 MED ORDER — FUROSEMIDE 40 MG PO TABS: 40.0000 mg | ORAL_TABLET | Freq: Every day | ORAL | 0 refills | Status: DC

## 2020-09-09 NOTE — ED Triage Notes (Signed)
Patient states she has had clusters of wounds appearing on her legs bilaterally. Pt is in a wheelchair. Pt states the boils are "popping". Pt is aox4 and ambulatory. Pt states the wounds are not painful but are itchy.

## 2020-09-09 NOTE — Discharge Instructions (Signed)
Begin doxycycline twice daily for 1 week to cover for infection Elevate legs when able If able to get over the knee compression stockings to further help with fluid in lower legs Lasix 40 mg daily-take in the morning, please monitor swelling in legs with taking this over the next 5 to 7 days If symptoms significantly improve may stop Lasix, please follow-up with primary care or with urgent care in approximately 5 to 7 days for recheck  Keep open wounds clean and dry  Return for any concerns you have

## 2020-09-10 ENCOUNTER — Ambulatory Visit (INDEPENDENT_AMBULATORY_CARE_PROVIDER_SITE_OTHER): Payer: BC Managed Care – PPO | Admitting: Psychology

## 2020-09-10 DIAGNOSIS — F064 Anxiety disorder due to known physiological condition: Secondary | ICD-10-CM

## 2020-09-10 DIAGNOSIS — Z630 Problems in relationship with spouse or partner: Secondary | ICD-10-CM | POA: Diagnosis not present

## 2020-09-10 NOTE — ED Provider Notes (Signed)
[facility]    CSN: [id number] Arrival date & time: 09/09/20  1426      History   Chief Complaint Chief Complaint  Patient presents with  . Wound Check    X 2 weeks     HPI [name] is a 54 y.o. female history of MS, presenting today for evaluation of bilateral lower leg swelling and wounds.  Patient reports that over the past few months she has had worsening swelling, with that she has had multiple areas of blisters develop that are popping and draining.  She was recently placed on a course of Keflex which she has completed as well as using betamethasone to help with itching.  She denies any significant pain with wounds and swelling, only itching.  She also was started on hydrochlorothiazide to take to help with fluid, but has not seen significant improvement with this.  She denies any history of kidney problems.  She does have some ankle high compression stockings, but does not wear this due to difficulty putting on.  She has become more immobile due to her MS and is largely wheelchair-bound, does walk with walker occasionally.  Denies history of heart failure.  HPI  Past Medical History:  Diagnosis Date  . Movement disorder   . Multiple sclerosis [facility])     Patient Active Problem List   Diagnosis Date Noted  . Edema 08/29/2020  . Lymphedema 08/02/2020  . Stress 08/02/2020  . Gait abnormality 08/02/2020  . Acute urinary tract infection 10/28/2018  . High risk medication use 10/21/2016  . Urinary frequency 04/22/2016  . Multiple sclerosis ([place]) 02/01/2015  . Gait disturbance 02/01/2015  . Left foot drop 02/01/2015  . Other fatigue 02/01/2015  . DS (disseminated sclerosis) ([place]) 01/06/2013  . Avitaminosis D 01/06/2013    Past Surgical History:  Procedure Laterality Date  . CESAREAN SECTION    . UTERINE FIBROID SURGERY    . WISDOM TOOTH EXTRACTION      OB History   No obstetric history on file.      Home Medications    Prior to Admission  medications   Medication Sig Start Date End Date Taking? Authorizing Provider  B-12 MICROLOZENGE 500 MCG SUBL TAKE 2 TABS BY MOUTH DAILY 05/27/20  Yes Sater, Nanine Means, MD  CVS B12 QUICK DISSOLVE 500 MCG LOZG TAKE 2 TABS BY MOUTH DAILY 11/14/19  Yes Sater, Nanine Means, MD  hydrochlorothiazide (HYDRODIURIL) 12.5 MG tablet Take 1 tablet (12.5 mg total) by mouth daily. 08/29/20  Yes [name], FNP  Probiotic Product (PROBIOTIC PO) Take 1 Dose by mouth daily.   Yes [provider]  TECFIDERA 240 MG CPDR Take 1 capsule by mouth twice daily 02/01/20  Yes Sater, Nanine Means, MD  TURMERIC-GINGER PO Take 2 each by mouth daily. Gummies   Yes [provider]  CVS D3 50 MCG (2000 UT) CAPS TAKE 1 CAPSULE BY MOUTH DAILY OR AS DIRECTED 10/11/19   Sater, Nanine Means, MD  doxycycline (VIBRAMYCIN) 100 MG capsule Take 1 capsule (100 mg total) by mouth 2 (two) times daily for 7 days. 09/09/20 09/16/20  Ngoc Detjen C, PA-C  furosemide (LASIX) 40 MG tablet Take 1 tablet (40 mg total) by mouth daily. 09/09/20   Davonda Ausley, Elesa Hacker, PA-C    Family History Family History  Problem Relation Age of Onset  . Hypertension Mother   . Hypertension Father   . Diabetes Father     Social History Social History   Tobacco  Use  . Smoking status: Never Smoker  . Smokeless tobacco: Never Used  Vaping Use  . Vaping Use: Never used  Substance Use Topics  . Alcohol use: No  . Drug use: No     Allergies   Food, Oxycodone hcl, and Soy allergy   Review of Systems Review of Systems  Constitutional: Negative for fatigue and fever.  HENT: Negative for mouth sores.   Eyes: Negative for visual disturbance.  Respiratory: Negative for shortness of breath.   Cardiovascular: Positive for leg swelling. Negative for chest pain.  Gastrointestinal: Negative for abdominal pain, nausea and vomiting.  Genitourinary: Negative for genital sores.  Musculoskeletal: Negative for arthralgias and joint swelling.  Skin:  Positive for wound. Negative for color change and rash.  Neurological: Negative for dizziness, weakness, light-headedness and headaches.     Physical Exam Triage Vital Signs ED Triage Vitals  Enc Vitals Group     BP 09/09/20 1706 140/73     Pulse Rate 09/09/20 1706 (!) 113     Resp 09/09/20 1706 16     Temp 09/09/20 1706 98.4 F (36.9 C)     Temp Source 09/09/20 1706 Oral     SpO2 09/09/20 1706 97 %     Weight --      Height --      Head Circumference --      Peak Flow --      Pain Score 09/09/20 1713 0     Pain Loc --      Pain Edu? --      Excl. in GC? --    No data found.  Updated Vital Signs BP 140/73 (BP Location: Right Arm)   Pulse (!) 113   Temp 98.4 F (36.9 C) (Oral)   Resp 16   SpO2 97%   Visual Acuity Right Eye Distance:   Left Eye Distance:   Bilateral Distance:    Right Eye Near:   Left Eye Near:    Bilateral Near:     Physical Exam Vitals and nursing note reviewed.  Constitutional:      Appearance: She is well-developed and well-nourished.     Comments: No acute distress  HENT:     Head: Normocephalic and atraumatic.     Nose: Nose normal.  Eyes:     Conjunctiva/sclera: Conjunctivae normal.  Cardiovascular:     Rate and Rhythm: Normal rate.  Pulmonary:     Effort: Pulmonary effort is normal. No respiratory distress.  Abdominal:     General: There is no distension.  Musculoskeletal:        General: Normal range of motion.     Cervical back: Neck supple.     Right lower leg: Edema present.     Left lower leg: Edema present.     Comments: Bilateral lower legs with 3+ pitting edema into anterior shins, edema extends to the knee, multiple fluid-filled bulla noted to lower extremities with associated hyperpigmentation, posterior left lower calf with wound, mild surrounding erythema and tenderness, right posterior calf with small area of draining serous fluid, nontender to palpation  Skin:    General: Skin is warm and dry.  Neurological:      Mental Status: She is alert and oriented to person, place, and time.  Psychiatric:        Mood and Affect: Mood and affect normal.      UC Treatments / Results  Labs (all labs ordered are listed, but only abnormal results are displayed) Labs Reviewed -  No data to display  EKG   Radiology No results found.  Procedures Procedures (including critical care time)  Medications Ordered in UC Medications - No data to display  Initial Impression / Assessment and Plan / UC Course  I have reviewed the triage vital signs and the nursing notes.  Pertinent labs & imaging results that were available during my care of the patient were reviewed by me and considered in my medical decision making (see chart for details).     Patient appears to have peripheral edema, possibly related to underlying venous stasis versus decreased mobility, no significant signs of infection, but given open wound with some mild soreness will opt to place on doxycycline, continue betamethasone to help with itching, initiating on Lasix to provide more diuresis to remove fluid, last kidney function 02/2020 normal.  Providing 40 mg daily for 5 to 7 days and recommending to follow-up with primary care or urgent care in 5 to 7 days for recheck of fluid and wounds.  Provided Ace wrap to wrap to help with compression, advised patient may benefit from over the knee compression stockings.  This likely will be chronic given her decreased mobility from her MS.  Recommending follow-up with PCP, possible vascular if needed.  Discussed strict return precautions. Patient verbalized understanding and is agreeable with plan.  Final Clinical Impressions(s) / UC Diagnoses   Final diagnoses:  Edema of both lower legs     Discharge Instructions     Begin doxycycline twice daily for 1 week to cover for infection Elevate legs when able If able to get over the knee compression stockings to further help with fluid in lower legs Lasix  40 mg daily-take in the morning, please monitor swelling in legs with taking this over the next 5 to 7 days If symptoms significantly improve may stop Lasix, please follow-up with primary care or with urgent care in approximately 5 to 7 days for recheck  Keep open wounds clean and dry  Return for any concerns you have   ED Prescriptions    Medication Sig Dispense Auth. Provider   doxycycline (VIBRAMYCIN) 100 MG capsule Take 1 capsule (100 mg total) by mouth 2 (two) times daily for 7 days. 14 capsule Arnita Koons C, PA-C   furosemide (LASIX) 40 MG tablet Take 1 tablet (40 mg total) by mouth daily. 7 tablet Eloisa Chokshi, [place] C, PA-C     PDMP not reviewed this encounter.   Janith Lima, PA-C 09/10/20 1025

## 2020-09-11 ENCOUNTER — Ambulatory Visit: Payer: Self-pay

## 2020-09-13 ENCOUNTER — Ambulatory Visit: Payer: BC Managed Care – PPO

## 2020-09-13 ENCOUNTER — Other Ambulatory Visit: Payer: Self-pay | Admitting: Nurse Practitioner

## 2020-09-13 DIAGNOSIS — R609 Edema, unspecified: Secondary | ICD-10-CM

## 2020-09-13 DIAGNOSIS — G35 Multiple sclerosis: Secondary | ICD-10-CM

## 2020-09-13 NOTE — Chronic Care Management (AMB) (Signed)
Care Management    Social Work Follow Up Note  09/13/2020 Name: [name] MRN: [id number] DOB: 12/31/1965  [name] is a 54 y.o. year old female who is a primary care patient of [name], [place]. The CCM team was consulted for assistance with care coordination.   Review of patient status, including review of consultants reports, other relevant assessments, and collaboration with appropriate care team members and the patient's provider was performed as part of comprehensive patient evaluation and provision of chronic care management services.    SDOH (Social Determinants of Health) assessments performed: No    Outpatient Encounter Medications as of 09/13/2020  Medication Sig  . B-12 MICROLOZENGE 500 MCG SUBL TAKE 2 TABS BY MOUTH DAILY  . CVS B12 QUICK DISSOLVE 500 MCG LOZG TAKE 2 TABS BY MOUTH DAILY  . CVS D3 50 MCG (2000 UT) CAPS TAKE 1 CAPSULE BY MOUTH DAILY OR AS DIRECTED  . doxycycline (VIBRAMYCIN) 100 MG capsule Take 1 capsule (100 mg total) by mouth 2 (two) times daily for 7 days.  . furosemide (LASIX) 40 MG tablet Take 1 tablet (40 mg total) by mouth daily.  . hydrochlorothiazide (HYDRODIURIL) 12.5 MG tablet Take 1 tablet (12.5 mg total) by mouth daily.  . Probiotic Product (PROBIOTIC PO) Take 1 Dose by mouth daily.  . TECFIDERA 240 MG CPDR Take 1 capsule by mouth twice daily  . TURMERIC-GINGER PO Take 2 each by mouth daily. Gummies   No facility-administered encounter medications on file as of 09/13/2020.     Patient Care Plan: Social Work Care Plan    Problem Identified: Care Coordination   Priority: Medium  Onset Date: 08/29/2020    Goal: Follow up with primary care team regarding skin condition   Start Date: 08/29/2020  Expected End Date: 09/28/2020  This Visit's Progress: On track  Recent Progress: On track  Priority: High  Note:   Current Barriers:  . Difficulty contacting dermatologist  . Ongoing skin condition that has began oozing and is  possibly spreading per patient report . Chronic conditions including Multiple Sclerosis and Vitamin B12 Deficiency which puts patient at increased risk of hospitalization  Social Work Clinical Goal(s):  [name] Over the next 2 days, patient will follow up with Primary care provider as directed by SW Goal Met . Over the next 7 days the patient will follow up with dermatologist regarding ongoing care for legs  Interventions: . 1:1 collaboration with [name], [place] regarding development and update of comprehensive plan of care as evidenced by provider attestation and co-signature . Inter-disciplinary care team collaboration (see longitudinal plan of care) . Successful outbound call placed to the patient to assess goal progression . Discussed the patient went to urgent care on 12.26.21 due to ongoing drainage from legs - Patient was prescribed Doxycycline 100 mg BID and Furosemide 75m daily . Patient reports she has seen some improvement with added medications but still feels her legs are draining . Discussed plan for patient to contact Dermatologist office to discuss ongoing management of drainage  . Advised the patient that following her recent Virtual visit with PCP on 12.15.21 - JMinette BrineFNP placed an order to [facility]. Unfortunately the patient did not qualify for the program . SW collaboration with Mrs. Moore to inquire if she felt the patient needed to be referred for home health services . Advised the patient SW would follow up with her over the next month  Patient Goals/Self-Care Activities Over the next 7 days,  patient will:   - Patient will self administer medications as prescribed Contact dermatologist office to schedule a follow up appointment Contact SW as needed prior to next scheduled call  Follow up Plan: SW will follow up with patient by phone over the next 30 days    Long-Range Goal: Home modifications   Start Date: 08/29/2020  Expected End Date: 12/27/2020  This  Visit's Progress: On track  Priority: Medium  Note:   Current Barriers:  . Financial constraints related to cost of home modifications  . ADL IADL limitations - unable to access bathroom independently due to modifications needed including walk-in shower and wider doorframes  . Chronic conditions including Multiple Sclerosis and Vitamin B12 Deficiency which put patient at increased risk of hospitalization  Social Work Clinical Goal(s):  [name] Over the next 120 days, patient will work with [facility] to address needs related to home modifications  Interventions: . 1:1 collaboration with [name], [place] regarding development and update of comprehensive plan of care as evidenced by provider attestation and co-signature . Inter-disciplinary care team collaboration (see longitudinal plan of care) . Successful outbound call placed to the patient to assess goal progression . Determined the patient is still awaiting a visit with an engineer to determine types of repair needs . Discussed the patient has left voice messages for [facility] requesting updates but has yet to get a return call . Advised the patient to continue advocating for herself while keeping in mind the amount of steps and time she may be waiting prior to receiving modifications  Patient Goals/Self-Care Activities Over the next 60 days, patient will:   - Patient will self administer medications as prescribed Patient will attend all scheduled provider appointments Patient will call provider office for new concerns or questions Engage with [facility] as needed regarding home modifications Contact SW as needed prior to next scheduled call  Follow up Plan: SW will follow up with patient by phone over the next 45 days        Follow Up Plan: SW will follow up with patient by phone over the next month   Daneen Schick, BSW, CDP Social Worker, Certified Dementia Practitioner [place] [facility] [place]  [facility] [phone]

## 2020-09-13 NOTE — Patient Instructions (Signed)
  Goals we discussed today:  Goals Addressed            This Visit's Progress   . Receive care for skin condition       Timeframe:  Short-Term Goal Priority:  High Start Date:  12.15.21                           Expected End Date: 1.14.22                      Next date of contact: 1.20.21  Patient Goals/Self-Care Activities Over the next 7 days, patient will:   - Patient will self administer medications as prescribed Contact dermatologist office to schedule a follow up appointment Contact SW as needed prior to next scheduled call

## 2020-09-15 DIAGNOSIS — U071 COVID-19: Secondary | ICD-10-CM

## 2020-09-15 HISTORY — DX: COVID-19: U07.1

## 2020-09-18 ENCOUNTER — Encounter: Payer: Self-pay | Admitting: Nurse Practitioner

## 2020-09-18 ENCOUNTER — Telehealth (INDEPENDENT_AMBULATORY_CARE_PROVIDER_SITE_OTHER): Payer: BC Managed Care – PPO | Admitting: Nurse Practitioner

## 2020-09-18 ENCOUNTER — Telehealth: Payer: Self-pay

## 2020-09-18 VITALS — Ht 66.0 in | Wt 290.0 lb

## 2020-09-18 DIAGNOSIS — U071 COVID-19: Secondary | ICD-10-CM

## 2020-09-18 DIAGNOSIS — R519 Headache, unspecified: Secondary | ICD-10-CM

## 2020-09-18 DIAGNOSIS — G8929 Other chronic pain: Secondary | ICD-10-CM | POA: Diagnosis not present

## 2020-09-18 DIAGNOSIS — R2242 Localized swelling, mass and lump, left lower limb: Secondary | ICD-10-CM

## 2020-09-18 DIAGNOSIS — T148XXA Other injury of unspecified body region, initial encounter: Secondary | ICD-10-CM | POA: Diagnosis not present

## 2020-09-18 NOTE — Patient Instructions (Signed)
COVID-19 COVID-19 is a respiratory infection that is caused by a virus called severe acute respiratory syndrome coronavirus 2 (SARS-CoV-2). The disease is also known as coronavirus disease or novel coronavirus. In some people, the virus may not cause any symptoms. In others, it may cause a serious infection. The infection can get worse quickly and can lead to complications, such as:  Pneumonia, or infection of the lungs.  Acute respiratory distress syndrome or ARDS. This is a condition in which fluid build-up in the lungs prevents the lungs from filling with air and passing oxygen into the blood.  Acute respiratory failure. This is a condition in which there is not enough oxygen passing from the lungs to the body or when carbon dioxide is not passing from the lungs out of the body.  Sepsis or septic shock. This is a serious bodily reaction to an infection.  Blood clotting problems.  Secondary infections due to bacteria or fungus.  Organ failure. This is when your body's organs stop working. The virus that causes COVID-19 is contagious. This means that it can spread from person to person through droplets from coughs and sneezes (respiratory secretions). What are the causes? This illness is caused by a virus. You may catch the virus by:  Breathing in droplets from an infected person. Droplets can be spread by a person breathing, speaking, singing, coughing, or sneezing.  Touching something, like a table or a doorknob, that was exposed to the virus (contaminated) and then touching your mouth, nose, or eyes. What increases the risk? Risk for infection You are more likely to be infected with this virus if you:  Are within 6 feet (2 meters) of a person with COVID-19.  Provide care for or live with a person who is infected with COVID-19.  Spend time in crowded indoor spaces or live in shared housing. Risk for serious illness You are more likely to become seriously ill from the virus if you:   Are 50 years of age or older. The higher your age, the more you are at risk for serious illness.  Live in a nursing home or long-term care facility.  Have cancer.  Have a long-term (chronic) disease such as: ? Chronic lung disease, including chronic obstructive pulmonary disease or asthma. ? A long-term disease that lowers your body's ability to fight infection (immunocompromised). ? Heart disease, including heart failure, a condition in which the arteries that lead to the heart become narrow or blocked (coronary artery disease), a disease which makes the heart muscle thick, weak, or stiff (cardiomyopathy). ? Diabetes. ? Chronic kidney disease. ? Sickle cell disease, a condition in which red blood cells have an abnormal "sickle" shape. ? Liver disease.  Are obese. What are the signs or symptoms? Symptoms of this condition can range from mild to severe. Symptoms may appear any time from 2 to 14 days after being exposed to the virus. They include:  A fever or chills.  A cough.  Difficulty breathing.  Headaches, body aches, or muscle aches.  Runny or stuffy (congested) nose.  A sore throat.  New loss of taste or smell. Some people may also have stomach problems, such as nausea, vomiting, or diarrhea. Other people may not have any symptoms of COVID-19. How is this diagnosed? This condition may be diagnosed based on:  Your signs and symptoms, especially if: ? You live in an area with a COVID-19 outbreak. ? You recently traveled to or from an area where the virus is common. ? You   provide care for or live with a person who was diagnosed with COVID-19. ? You were exposed to a person who was diagnosed with COVID-19.  A physical exam.  Lab tests, which may include: ? Taking a sample of fluid from the back of your nose and throat (nasopharyngeal fluid), your nose, or your throat using a swab. ? A sample of mucus from your lungs (sputum). ? Blood tests.  Imaging tests, which  may include, X-rays, CT scan, or ultrasound. How is this treated? At present, there is no medicine to treat COVID-19. Medicines that treat other diseases are being used on a trial basis to see if they are effective against COVID-19. Your health care provider will talk with you about ways to treat your symptoms. For most people, the infection is mild and can be managed at home with rest, fluids, and over-the-counter medicines. Treatment for a serious infection usually takes places in a hospital intensive care unit (ICU). It may include one or more of the following treatments. These treatments are given until your symptoms improve.  Receiving fluids and medicines through an IV.  Supplemental oxygen. Extra oxygen is given through a tube in the nose, a face mask, or a hood.  Positioning you to lie on your stomach (prone position). This makes it easier for oxygen to get into the lungs.  Continuous positive airway pressure (CPAP) or bi-level positive airway pressure (BPAP) machine. This treatment uses mild air pressure to keep the airways open. A tube that is connected to a motor delivers oxygen to the body.  Ventilator. This treatment moves air into and out of the lungs by using a tube that is placed in your windpipe.  Tracheostomy. This is a procedure to create a hole in the neck so that a breathing tube can be inserted.  Extracorporeal membrane oxygenation (ECMO). This procedure gives the lungs a chance to recover by taking over the functions of the heart and lungs. It supplies oxygen to the body and removes carbon dioxide. Follow these instructions at home: Lifestyle  If you are sick, stay home except to get medical care. Your health care provider will tell you how long to stay home. Call your health care provider before you go for medical care.  Rest at home as told by your health care provider.  Do not use any products that contain nicotine or tobacco, such as cigarettes, e-cigarettes, and  chewing tobacco. If you need help quitting, ask your health care provider.  Return to your normal activities as told by your health care provider. Ask your health care provider what activities are safe for you. General instructions  Take over-the-counter and prescription medicines only as told by your health care provider.  Drink enough fluid to keep your urine pale yellow.  Keep all follow-up visits as told by your health care provider. This is important. How is this prevented?  There is no vaccine to help prevent COVID-19 infection. However, there are steps you can take to protect yourself and others from this virus. To protect yourself:   Do not travel to areas where COVID-19 is a risk. The areas where COVID-19 is reported change often. To identify high-risk areas and travel restrictions, check the CDC travel website: wwwnc.cdc.gov/travel/notices  If you live in, or must travel to, an area where COVID-19 is a risk, take precautions to avoid infection. ? Stay away from people who are sick. ? Wash your hands often with soap and water for 20 seconds. If soap and water   are not available, use an alcohol-based hand sanitizer. ? Avoid touching your mouth, face, eyes, or nose. ? Avoid going out in public, follow guidance from your state and local health authorities. ? If you must go out in public, wear a cloth face covering or face mask. Make sure your mask covers your nose and mouth. ? Avoid crowded indoor spaces. Stay at least 6 feet (2 meters) away from others. ? Disinfect objects and surfaces that are frequently touched every day. This may include:  Counters and tables.  Doorknobs and light switches.  Sinks and faucets.  Electronics, such as phones, remote controls, keyboards, computers, and tablets. To protect others: If you have symptoms of COVID-19, take steps to prevent the virus from spreading to others.  If you think you have a COVID-19 infection, contact your health care  provider right away. Tell your health care team that you think you may have a COVID-19 infection.  Stay home. Leave your house only to seek medical care. Do not use public transport.  Do not travel while you are sick.  Wash your hands often with soap and water for 20 seconds. If soap and water are not available, use alcohol-based hand sanitizer.  Stay away from other members of your household. Let healthy household members care for children and pets, if possible. If you have to care for children or pets, wash your hands often and wear a mask. If possible, stay in your own room, separate from others. Use a different bathroom.  Make sure that all people in your household wash their hands well and often.  Cough or sneeze into a tissue or your sleeve or elbow. Do not cough or sneeze into your hand or into the air.  Wear a cloth face covering or face mask. Make sure your mask covers your nose and mouth. Where to find more information  Centers for Disease Control and Prevention: www.cdc.gov/coronavirus/2019-ncov/index.html  World Health Organization: www.who.int/health-topics/coronavirus Contact a health care provider if:  You live in or have traveled to an area where COVID-19 is a risk and you have symptoms of the infection.  You have had contact with someone who has COVID-19 and you have symptoms of the infection. Get help right away if:  You have trouble breathing.  You have pain or pressure in your chest.  You have confusion.  You have bluish lips and fingernails.  You have difficulty waking from sleep.  You have symptoms that get worse. These symptoms may represent a serious problem that is an emergency. Do not wait to see if the symptoms will go away. Get medical help right away. Call your local emergency services (911 in the [place] Do not drive yourself to the hospital. Let the emergency medical personnel know if you think you have COVID-19. Summary  COVID-19 is a  respiratory infection that is caused by a virus. It is also known as coronavirus disease or novel coronavirus. It can cause serious infections, such as pneumonia, acute respiratory distress syndrome, acute respiratory failure, or sepsis.  The virus that causes COVID-19 is contagious. This means that it can spread from person to person through droplets from breathing, speaking, singing, coughing, or sneezing.  You are more likely to develop a serious illness if you are 50 years of age or older, have a weak immune system, live in a nursing home, or have chronic disease.  There is no medicine to treat COVID-19. Your health care provider will talk with you about ways to treat your symptoms.    Take steps to protect yourself and others from infection. Wash your hands often and disinfect objects and surfaces that are frequently touched every day. Stay away from people who are sick and wear a mask if you are sick. This information is not intended to replace advice given to you by your health care provider. Make sure you discuss any questions you have with your health care provider. Document Revised: 07/01/2019 Document Reviewed: 10/07/2018 Elsevier Patient Education  2020 Elsevier Inc.  

## 2020-09-18 NOTE — Progress Notes (Signed)
Virtual Visit via [facility] [employer] as a Education administrator for [name], FNP.,have documented all relevant documentation on the behalf of [name], FNP,as directed by  [name], FNP while in the presence of [name], [place].  This visit type was conducted due to national recommendations for restrictions regarding the COVID-19 Pandemic (e.g. social distancing) in an effort to limit this patient's exposure and mitigate transmission in our community.  Due to her co-morbid illnesses, this patient is at least at moderate risk for complications without adequate follow up.  This format is felt to be most appropriate for this patient at this time.  All issues noted in this document were discussed and addressed.  A limited physical exam was performed with this format.    This visit type was conducted due to national recommendations for restrictions regarding the COVID-19 Pandemic (e.g. social distancing) in an effort to limit this patient's exposure and mitigate transmission in our community.  Patients identity confirmed using two different identifiers.  This format is felt to be most appropriate for this patient at this time.  All issues noted in this document were discussed and addressed.  No physical exam was performed (except for noted visual exam findings with Video Visits).    Date:  09/25/2020   ID:  [name], DOB 15-Jun-1966, MRN [id number]  Patient Location:  Home - spoke with [name]  Provider location:   Office    Chief Complaint:  Positive for covid  History of Present Illness:    [name] is a 55 y.o. female who presents via video conferencing for a telehealth visit today.    The patient does not have symptoms concerning for COVID-19 infection (fever, chills, cough, or new shortness of breath).   Virtual visit for positive for covid was having sore throat, headache, body aches/muscle ache and chills took the at home test on December 31st,  started with symptoms December 31st.  She was having difficulty getting up from the bathroom. She did have a cough.  Denies fever or diarrhea.  She is taking ibuprofen for the chills, headache and sorethroat. She is drinking plenty of fluids. Denies shortness of breath.      Past Medical History:  Diagnosis Date  . Movement disorder   . Multiple sclerosis ([place])    Past Surgical History:  Procedure Laterality Date  . CESAREAN SECTION    . UTERINE FIBROID SURGERY    . WISDOM TOOTH EXTRACTION       Current Meds  Medication Sig  . CVS B12 QUICK DISSOLVE 500 MCG LOZG TAKE 2 TABS BY MOUTH DAILY  . CVS D3 50 MCG (2000 UT) CAPS TAKE 1 CAPSULE BY MOUTH DAILY OR AS DIRECTED  . furosemide (LASIX) 40 MG tablet Take 1 tablet (40 mg total) by mouth daily. (Patient not taking: Reported on 09/24/2020)  . Probiotic Product (PROBIOTIC PO) Take 1 Dose by mouth daily.  . TECFIDERA 240 MG CPDR Take 1 capsule by mouth twice daily  . TURMERIC-GINGER PO Take 2 each by mouth daily. Gummies     Allergies:   Food, Oxycodone hcl, and Soy allergy   Social History   Tobacco Use  . Smoking status: Never Smoker  . Smokeless tobacco: Never Used  Vaping Use  . Vaping Use: Never used  Substance Use Topics  . Alcohol use: No  . Drug use: No     Family Hx: The patient's family history includes Diabetes in her father; Hypertension  in her father and mother.  ROS:   Please see the history of present illness.    Review of Systems  Constitutional: Positive for chills. Negative for fever and malaise/fatigue.  Respiratory: Negative.  Negative for shortness of breath and wheezing.   Cardiovascular: Negative.  Negative for leg swelling.  Musculoskeletal: Positive for myalgias. Negative for back pain and joint pain.  Neurological: Negative for dizziness and headaches.    All other systems reviewed and are negative.   Labs/Other Tests and Data Reviewed:    Recent Labs: 02/20/2020: ALT 23; BUN 11; Creatinine,  Ser 0.80; Potassium 3.9; Sodium 139 08/02/2020: Hemoglobin 15.4; Platelets 310; TSH 4.120   Recent Lipid Panel No results found for: CHOL, TRIG, HDL, CHOLHDL, LDLCALC, LDLDIRECT  Wt Readings from Last 3 Encounters:  09/18/20 290 lb (131.5 kg)  08/29/20 290 lb (131.5 kg)  08/29/20 290 lb (131.5 kg)     Exam:    Vital Signs:  Ht 5\' 6"  (1.676 m)   Wt 290 lb (131.5 kg)   BMI 46.81 kg/m     Physical Exam Vitals reviewed.  Constitutional:      General: She is not in acute distress.    Appearance: Normal appearance. She is obese.  Pulmonary:     Effort: Pulmonary effort is normal. No respiratory distress.  Skin:    Comments: Left leg with open wound looks like open blister  Neurological:     Mental Status: She is alert and oriented to person, place, and time.  Psychiatric:        Mood and Affect: Mood normal.        Behavior: Behavior normal.        Thought Content: Thought content normal.        Judgment: Judgment normal.     ASSESSMENT & PLAN:    1. Lab test positive for detection of COVID-19 virus  Tested positive for covid  She is aware if has shortness of breath or sudden onset pain to go to ER for evaluation and to remain in quarantine for 10 days from onset of symptoms  2. Chronic nonintractable headache, unspecified headache type  Continue with tylenol for headache and stay well hydrated with water  3. Localized swelling of left lower extremity  She is to continue with lasix  Currently has what looks like trace edema on video - Ambulatory referral to Home Health  4. Open wound She has an open area to her left lower leg which looks like a blister opened.she is to use a nonstick gauze to cover Will see if we can get home health or [facility] out to see her - Ambulatory referral to Home Health      COVID-19 Education: The signs and symptoms of COVID-19 were discussed with the patient and how to seek care for testing (follow up with PCP or arrange  E-visit).  The importance of social distancing was discussed today.  Patient Risk:   After full review of this patients clinical status, I feel that they are at least moderate risk at this time.  Time:   Today, I have spent 12 minutes/ seconds with the patient with telehealth technology discussing above diagnoses.     Medication Adjustments/Labs and Tests Ordered: Current medicines are reviewed at length with the patient today.  Concerns regarding medicines are outlined above.   Tests Ordered: Orders Placed This Encounter  Procedures  . Ambulatory referral to Home Health    Medication Changes: No orders of the defined  types were placed in this encounter.   Disposition:  Follow up prn  Signed, [name], FNP

## 2020-09-18 NOTE — Telephone Encounter (Signed)
  Chronic Care Management   Outreach Note  09/18/2020 Name: [name] MRN: [id number] DOB: [birth year]  Referred by: Arnette Felts, FNP Reason for referral : Care Coordination   [name] is enrolled in a Managed Medicaid Health Plan: No  SW placed an unsuccessful outbound call to the patient in response to a voice message received. The patient indicated she is positive for COVID and needs a virtual appointment with her PCP. The patient also indicated on her voice message she has reached out to her primary providers office for scheduling.  Follow Up Plan: SW left a HIPAA compliant voice message requesting a return call if needed. SW will follow up with the patient over the next month as previously scheduled.  Bevelyn Ngo, BSW, CDP Social Worker, Certified Dementia Practitioner [facility] Care Management [phone]

## 2020-09-19 ENCOUNTER — Telehealth: Payer: Self-pay | Admitting: Neurology

## 2020-09-19 ENCOUNTER — Telehealth: Payer: BC Managed Care – PPO

## 2020-09-19 ENCOUNTER — Telehealth: Payer: Self-pay

## 2020-09-19 NOTE — Telephone Encounter (Signed)
Pt wants Dr Epimenio Foot to be aware that on New Years Eve she was diagnosed to have Covid-19

## 2020-09-19 NOTE — Telephone Encounter (Cosign Needed)
  Chronic Care Management   Outreach Note  09/19/2020 Name: [name] MRN: [id number] DOB: [birth year]  Referred by: Arnette Felts, FNP Reason for referral : No chief complaint on file.   Voice message received from patient requesting a return call. Patient indicates she tested positive for COVID 19 on 09/14/21 and has some general questions.The patient was referred to the case management team for assistance with care management and care coordination.   Follow Up Plan: Telephone follow up appointment with care management team member scheduled for: 09/20/20  Delsa Sale, RN, BSN, CCM Care Management Coordinator [facility] Care Management/[facility]  Direct Phone: [phone]

## 2020-09-20 ENCOUNTER — Telehealth: Payer: BC Managed Care – PPO

## 2020-09-20 ENCOUNTER — Ambulatory Visit: Payer: Self-pay

## 2020-09-20 ENCOUNTER — Other Ambulatory Visit: Payer: Self-pay | Admitting: Nurse Practitioner

## 2020-09-20 DIAGNOSIS — R269 Unspecified abnormalities of gait and mobility: Secondary | ICD-10-CM

## 2020-09-20 DIAGNOSIS — G35 Multiple sclerosis: Secondary | ICD-10-CM

## 2020-09-20 DIAGNOSIS — R21 Rash and other nonspecific skin eruption: Secondary | ICD-10-CM

## 2020-09-20 DIAGNOSIS — U071 COVID-19: Secondary | ICD-10-CM

## 2020-09-20 DIAGNOSIS — R609 Edema, unspecified: Secondary | ICD-10-CM

## 2020-09-21 NOTE — Chronic Care Management (AMB) (Signed)
Care Management   Follow Up Note  09/20/20  Name: [name] MRN: [id number] DOB: 06-24-1966  Referred by: [name], FNP Reason for referral : Chronic Care Management (RN CM FU Call )   [name] is a 55 y.o. year old female who is a primary care patient of [name], [place]. The CCM team was consulted for assistance with chronic disease management and care coordination needs.    Review of patient status, including review of consultants reports, relevant laboratory and other test results, and collaboration with appropriate care team members and the patient's provider was performed as part of comprehensive patient evaluation and provision of chronic care management services.    SDOH (Social Determinants of Health) assessments performed: No  See Care Plan activities for detailed interventions related to [place])   Placed CCM RN CM outbound follow up call to patient for a care plan update.     Outpatient Encounter Medications as of 09/20/2020  Medication Sig  . CVS B12 QUICK DISSOLVE 500 MCG LOZG TAKE 2 TABS BY MOUTH DAILY  . CVS D3 50 MCG (2000 UT) CAPS TAKE 1 CAPSULE BY MOUTH DAILY OR AS DIRECTED  . furosemide (LASIX) 40 MG tablet Take 1 tablet (40 mg total) by mouth daily.  . hydrochlorothiazide (HYDRODIURIL) 12.5 MG tablet TAKE 1 TABLET BY MOUTH EVERY DAY  . Probiotic Product (PROBIOTIC PO) Take 1 Dose by mouth daily.  . TECFIDERA 240 MG CPDR Take 1 capsule by mouth twice daily  . TURMERIC-GINGER PO Take 2 each by mouth daily. Gummies   No facility-administered encounter medications on file as of 09/20/2020.     Objective:  No results found for: HGBA1C Lab Results  Component Value Date   CREATININE 0.80 02/20/2020   BP Readings from Last 3 Encounters:  09/09/20 140/73  08/02/20 139/90  02/20/20 116/88    Goals Addressed    Patient Care Plan: General Plan of Care (Adult)    Problem Identified: COVID 19   Priority: High    Goal: Manage COVID 19 symptoms    Start Date: 09/20/2020  Expected End Date: 10/04/2020  This Visit's Progress: On track  Priority: High  Note:   Current Barriers:  [name] Knowledge Deficits related to COVID-19 and impact on patient self health management . Unable to perform IADLs independently Clinical Goal(s):  [name] Over the next 30 days, patient will verbalize basic understanding of COVID-19 impact on individual health and self health management as evidenced by verbalization of basic understanding of COVID-19 as a viral disease, measures to prevent exposure, signs and symptoms, when to contact provider Interventions: . Provided education to patient to enhance basic understanding of COVID-19 as a viral disease, measures to prevent exposure, signs and symptoms, recommended vaccine schedule, when to contact provider . Reviewed medications with patient and discussed patient is adhering to taking the prescribed vitamins and expectorant as prescribed by PCP; Educated on how to alternate Acetaminophen and Ibu Profen as needed for fever and pain relief . Collaborated with PCP provider [name], FNP  regarding patient's need for referral to [facility] COVID team for closer observation of COVID 19 symptoms  . Discussed plans with patient for ongoing care management follow up and provided patient with direct contact information for care management team Patient Goals/Self-Care Activities . Over the next 14 days, patient will:  monitor her Oxygen level at home and call PCP if level drops below 90 -report new or worsening shortness of breath or chest pain promptly -  report fever >101.4 to MD -stay well hydrated with 70/30 Gatorade/water daily  -continue OTC vitamins and Expectorant as directed by PCP -balance activity with rest -receive services from [facility] as recommended by PCP for close monitoring of COVID 19 symptoms        -notify Neurology of new or worsening symptoms related to MS during time of COVID symptoms          Follow Up Plan: Telephone follow up appointment with care management team member scheduled for: 09/24/20     Plan:   Telephone follow up appointment with care management team member scheduled for: 09/24/20  Barb Merino, RN, BSN, CCM Care Management Coordinator [place] [facility]  Direct Phone: [phone]

## 2020-09-24 ENCOUNTER — Ambulatory Visit: Payer: Self-pay

## 2020-09-24 ENCOUNTER — Telehealth: Payer: BC Managed Care – PPO

## 2020-09-24 DIAGNOSIS — R5383 Other fatigue: Secondary | ICD-10-CM

## 2020-09-24 DIAGNOSIS — G35 Multiple sclerosis: Secondary | ICD-10-CM

## 2020-09-24 DIAGNOSIS — M21372 Foot drop, left foot: Secondary | ICD-10-CM

## 2020-09-24 DIAGNOSIS — G35D Multiple sclerosis, unspecified: Secondary | ICD-10-CM

## 2020-09-24 DIAGNOSIS — R269 Unspecified abnormalities of gait and mobility: Secondary | ICD-10-CM

## 2020-09-24 NOTE — Chronic Care Management (AMB) (Cosign Needed Addendum)
Care Management   CCM RN Visit Note  09/24/2020 Name: [name] MRN: [id number] DOB: 1965/11/28  Subjective: [name] is a 55 y.o. year old female who is a primary care patient of [name], [place]. The CCM team was consulted for assistance with chronic disease management and care coordination needs.    Engaged with patient by telephone for follow up visit in response to provider referral for pharmacy case management and/or care coordination services.   Consent to Services:  The patient was given information about Chronic Care Management services, agreed to services, and gave verbal consent prior to initiation of services.  Please see initial visit note for detailed documentation.   Patient agreed to services and verbal consent obtained.   Assessment/Interventions: Review of patient past medical history, allergies, medications, health status, including review of consultants reports, laboratory and other test data, was performed as part of comprehensive evaluation and provision of chronic care management services.   SDOH (Social Determinants of Health) assessments and interventions performed:  No  CCM Care Plan  Allergies  Allergen Reactions  . Food Other (See Comments)    WHAT FOOD CAUSES THIS REACTION? Eyes swelling and throat swelling  . Oxycodone Hcl Hives  . Soy Allergy     Other reaction(s): SWELLING    Outpatient Encounter Medications as of 09/24/2020  Medication Sig  . CVS B12 QUICK DISSOLVE 500 MCG LOZG TAKE 2 TABS BY MOUTH DAILY  . CVS D3 50 MCG (2000 UT) CAPS TAKE 1 CAPSULE BY MOUTH DAILY OR AS DIRECTED  . furosemide (LASIX) 40 MG tablet Take 1 tablet (40 mg total) by mouth daily. (Patient not taking: Reported on 09/24/2020)  . hydrochlorothiazide (HYDRODIURIL) 12.5 MG tablet TAKE 1 TABLET BY MOUTH EVERY DAY  . Probiotic Product (PROBIOTIC PO) Take 1 Dose by mouth daily.  . TECFIDERA 240 MG CPDR Take 1 capsule by mouth twice daily  . TURMERIC-GINGER PO  Take 2 each by mouth daily. Gummies   No facility-administered encounter medications on file as of 09/24/2020.    Patient Active Problem List   Diagnosis Date Noted  . Edema 08/29/2020  . Lymphedema 08/02/2020  . Stress 08/02/2020  . Gait abnormality 08/02/2020  . Acute urinary tract infection 10/28/2018  . High risk medication use 10/21/2016  . Urinary frequency 04/22/2016  . Multiple sclerosis ([place]) 02/01/2015  . Gait disturbance 02/01/2015  . Left foot drop 02/01/2015  . Other fatigue 02/01/2015  . DS (disseminated sclerosis) ([place]) 01/06/2013  . Avitaminosis D 01/06/2013    Conditions to be addressed/monitored:Multiple Sclerosis, left foot drop, fatigue, gait abnormality   Patient Care Plan: General Plan of Care (Adult)    Problem Identified: COVID 19   Priority: High    Goal: Manage COVID 19 symptoms   Start Date: 09/20/2020  Expected End Date: 10/28/2020  This Visit's Progress: On track  Recent Progress: On track  Priority: High  Note:   Current Barriers:  [name] Knowledge Deficits related to COVID-19 and impact on patient self health management . Unable to perform IADLs independently Clinical Goal(s):  [name] Over the next 30 days, patient will verbalize basic understanding of COVID-19 impact on individual health and self health management as evidenced by verbalization of basic understanding of COVID-19 as a viral disease, measures to prevent exposure, signs and symptoms, when to contact provider Interventions: . Provided education to patient to enhance basic understanding of COVID-19 as a viral disease, measures to prevent exposure, signs and symptoms, recommended vaccine schedule, when  to contact provider . Reviewed medications with patient and discussed patient is adhering to taking the prescribed vitamins and expectorant as prescribed by PCP . Determined patient is receiving in home [facility] services for observation of COVID 19 . Determined patient's symptoms have  improved, she is only c/o increased mucous production, she continue have mild weakness to her right arm for which she is taking Ibu profen, denies other symptoms . Re-iterated importance of using IS 5 times per hour while awake, rationale provided . Determined patient continues to monitor oxygen sats, reports running in high 90's . Re-educated on importance of staying well hydrated  . Determined patient is scheduled to receive Pfizer booster vaccine in the home on 09/25/20 @1 :45 pm, per patient PCP provider is aware and confirms booster is okay to receive  . Discussed plans with patient for ongoing care management follow up and provided patient with direct contact information for care management team Patient Goals/Self-Care Activities . Over the next 30 days, patient will:  Continue to monitor her Oxygen level at home and call PCP if level drops below 90 - report new or worsening shortness of breath or chest pain promptly - stay well hydrated with 70/30 Gatorade/water daily  - continue OTC vitamins and Expectorant as directed by PCP - balance activity with rest - continue services from Remote Health as recommended by PCP for close monitoring of COVID 19 symptoms        - use incentive spirometer as directed  - call Dr. Felecia Shelling to schedule MS f/u visit   - receive COVID [place] booster vaccine as scheduled in home on 09/25/20      Follow Up Plan: Telephone follow up appointment with care management team member scheduled for: 10/24/20   Patient Care Plan: Impaired Skin Integrity    Problem Identified: Skin Integrity     Goal: Patient Stated     Long-Range Goal: Skin Integrity Maintained   Start Date: 09/24/2020  Expected End Date: 11/19/2020  This Visit's Progress: On track  Priority: Medium  Note:   Current Barriers:   Ineffective Self Health Maintenance  Unable to perform IADLs independently  Currently UNABLE TO independently self manage needs related to chronic health conditions.    Knowledge Deficits related to short term plan for care coordination needs and long term plans for chronic disease management needs Nurse Case Manager Clinical Goal(s):   Over the next 60 days, patient will work with care management team to address care coordination and chronic disease management needs related to Disease Management  Educational Needs  Care Coordination  Medication Management and Education  Psychosocial Support   Interventions:   Determined patient continues to apply the prescribed cream to her closed wounds and rash with good effectiveness  Determined patient reports having decreased swelling to her lower extremities  Determined patient d/c taking HCTZ as prescribed by PCP due to having too frequent urination  Sent PCP secure message alerting [name], FNP  Educated patient on importance of elevating lower extremities while resting, and to notify PCP and or CCM team member if rash, swelling or open wounds reoccur promptly  Patient Goals/Self Care Activities:  Over the next 60 days, patient will:  - continue to apply prescribed cream to lower extremities  - continue to elevate lower extremities while resting - report new or worsening edema or skin rash/wound to PCP  or CCM team member promptly - continue to stay well hydrated    Follow Up Plan: Telephone follow up  appointment with care management team member scheduled for:  10/24/20     Plan:Telephone follow up appointment with care management team member scheduled for:  10/24/20  Barb Merino, RN, BSN, CCM Care Management Coordinator [place] [facility]  Direct Phone: [phone]

## 2020-09-24 NOTE — Patient Instructions (Signed)
Visit Information  Goals    .  Manage COVID symptoms (pt-stated)      Timeframe:  Short-Term Goal Priority:  High Start Date:  09/20/20                           Expected End Date:  10/28/20  Next Follow up date: 10/24/20          Over the next 30 days, patient will:  - Continue to monitor her Oxygen level at home and call PCP if level drops below 90 - report new or worsening shortness of breath or chest pain promptly - stay well hydrated with 70/30 Gatorade/water daily  - continue OTC vitamins and Expectorant as directed by PCP - balance activity with rest - continue services from [facility] as recommended by PCP for close monitoring of COVID 19 symptoms        - use incentive spirometer as directed  - call Dr. Felecia Shelling to schedule MS f/u visit   - receive COVID 19 Pfizer booster vaccine as scheduled in home on 09/25/20        .  Manage swelling and skin rash (pt-stated)      Timeframe:  Long-Range Goal Priority:  Medium Start Date: 09/24/20                            Expected End Date:  11/19/20   Next follow up date:  10/24/20  Over the next 60 days, patient will:   - continue to apply prescribed cream to lower extremities  - continue to elevate lower extremities while resting - report new or worsening edema or skin rash/wound to PCP  or CCM team member promptly - continue to stay well hydrated                         The patient verbalized understanding of instructions, educational materials, and care plan provided today and declined offer to receive copy of patient instructions, educational materials, and care plan.   Telephone follow up appointment with care management team member scheduled for: 10/24/20  Lynne Logan, RN

## 2020-09-25 ENCOUNTER — Ambulatory Visit: Payer: Self-pay

## 2020-09-25 ENCOUNTER — Ambulatory Visit: Payer: Medicare Other | Attending: Critical Care Medicine

## 2020-09-25 DIAGNOSIS — Z23 Encounter for immunization: Secondary | ICD-10-CM

## 2020-09-25 NOTE — Progress Notes (Signed)
   Covid-19 Vaccination Clinic  Name:  MAKALIA BARE    MRN: [id number] DOB: 05/19/1966  09/25/2020  Ms. Bowley was observed post Covid-19 immunization for 15 minutes without incident. She was provided with Vaccine Information Sheet and instruction to access the V-Safe system.   Ms. Bureau was instructed to call 911 with any severe reactions post vaccine: [name] Difficulty breathing  . Swelling of face and throat  . A fast heartbeat  . A bad rash all over body  . Dizziness and weakness   Immunizations Administered    Name Date Dose VIS Date Route   Pfizer COVID-19 Vaccine 09/25/2020 10:45 AM 0.3 mL 07/04/2020 Intramuscular   Manufacturer: [place]   Lot: [id number]   NDC: [address]-9

## 2020-10-02 ENCOUNTER — Telehealth: Payer: BC Managed Care – PPO

## 2020-10-04 ENCOUNTER — Telehealth: Payer: BC Managed Care – PPO

## 2020-10-04 ENCOUNTER — Telehealth: Payer: Self-pay

## 2020-10-04 LAB — COLOGUARD

## 2020-10-04 NOTE — Telephone Encounter (Signed)
  Chronic Care Management   Outreach Note  10/04/2020 Name: [name] MRN: [id number] DOB: [birth year]  Referred by: [name], FNP Reason for referral : Care Coordination   An unsuccessful telephone outreach was attempted today. The patient was referred to the case management team for assistance with care management and care coordination.   Follow Up Plan: A HIPAA compliant phone message was left for the patient providing contact information and requesting a return call.  The care management team will reach out to the patient again over the next 21 days.   Daneen Schick, BSW, CDP Social Worker, Certified Dementia Practitioner [place] [facility] [place] [facility] [phone]

## 2020-10-05 ENCOUNTER — Other Ambulatory Visit: Payer: Self-pay | Admitting: Nurse Practitioner

## 2020-10-05 DIAGNOSIS — R21 Rash and other nonspecific skin eruption: Secondary | ICD-10-CM

## 2020-10-09 ENCOUNTER — Ambulatory Visit: Payer: Medicare Other | Admitting: Psychology

## 2020-10-10 ENCOUNTER — Other Ambulatory Visit: Payer: Self-pay | Admitting: Neurology

## 2020-10-10 ENCOUNTER — Telehealth: Payer: Self-pay | Admitting: Neurology

## 2020-10-10 NOTE — Telephone Encounter (Signed)
Sent in refill for B12 to pharmacy as requested in refill que. I will speak with MD about Tecfidera

## 2020-10-10 NOTE — Telephone Encounter (Signed)
Called pt back back. Advised we sent in refill for B12. Dr. Felecia Shelling would like her to come in for appt to discuss other treatment options. Offered tomorrow at 930am. She declined, asked for later appt. Unsure if husband can bring her. She will call back in the morning to make appt.

## 2020-10-10 NOTE — Telephone Encounter (Signed)
Dr. Felecia Shelling- did you want me to bring her in for a work in appt to discuss other DMT options with you?

## 2020-10-10 NOTE — Telephone Encounter (Signed)
Yes lets do that

## 2020-10-10 NOTE — Telephone Encounter (Signed)
Pt called, to inquire about refill for [facility] B12 QUICK DISSOLVE 500 MCG LOZG. I(Annie) informed Pt refill has been send to physician medication queue.  [employer] informed me they will no longer pay for my TECFIDERA 240 MG CPDR. Would like a call from the nurse to see what she can do to help me.

## 2020-10-11 ENCOUNTER — Telehealth: Payer: Self-pay | Admitting: *Deleted

## 2020-10-11 DIAGNOSIS — G35 Multiple sclerosis: Secondary | ICD-10-CM

## 2020-10-11 MED ORDER — TECFIDERA 240 MG PO CPDR: DELAYED_RELEASE_CAPSULE | ORAL | 11 refills | Status: DC

## 2020-10-11 NOTE — Telephone Encounter (Signed)
Submitted PA. Waiting on determination from [employer].

## 2020-10-11 NOTE — Addendum Note (Signed)
Addended by: Wyvonnia Lora on: 10/11/2020 03:31 PM   Modules accepted: Orders

## 2020-10-11 NOTE — Telephone Encounter (Signed)
Pt. states [facility] needs PA for tecfidera. She also states she cannot make appt.

## 2020-10-11 NOTE — Telephone Encounter (Signed)
Initiated urgent PA Tecfidera on CMM (brand). OPF:[id number]. Waiting on OV notes to be attached and then I will submit.

## 2020-10-11 NOTE — Telephone Encounter (Signed)
Called pt back. She could not come in this am for appt. Offered next Thursday at 9 or 1:30pm with Dr. Felecia Shelling. She is unable to do this. Scheduled appt for 10/25/20 at 1:30pm. Asked she check in by 1pm.  She states insurance requiring PA for Tecfidera. She will upload insurance cards to [facility] account so we can proceed with completing PA.

## 2020-10-11 NOTE — Telephone Encounter (Signed)
Received the following response: "[id number] case has been cancelled for Brand Tecfidera 240mg  capsule, use as directed, for the following reason: For further assistance in processing this claim, the pharmacy may contact Help Desk at [phone].Reviewed by: Vidal Schwalbe"  Called [employer] prior auth department at [phone] and spoke with Elmo. PA cancelled, he states medication does not require a prior authorization. Rejection that pharmacy is getting can be fixed by pharmacy help desk. He transferred me to them. Spoke with Garlon Hatchet. Confirmed PA not required. Qty limit: one months supply at a time. Rx sent was for 90 days supply and this is why it is denying. We need to submit new rx for 30 days supply. I did this. I sent mychart message to pt to update her as well.

## 2020-10-15 IMAGING — DX DG CHEST 1V PORT
1 series · 1 of 1 positions shown · non-contrast
Comparison: 08/06/2005

CLINICAL DATA: Dizziness, presyncopal episode during defecation.

EXAM:
PORTABLE CHEST 1 VIEW

[chest ap]
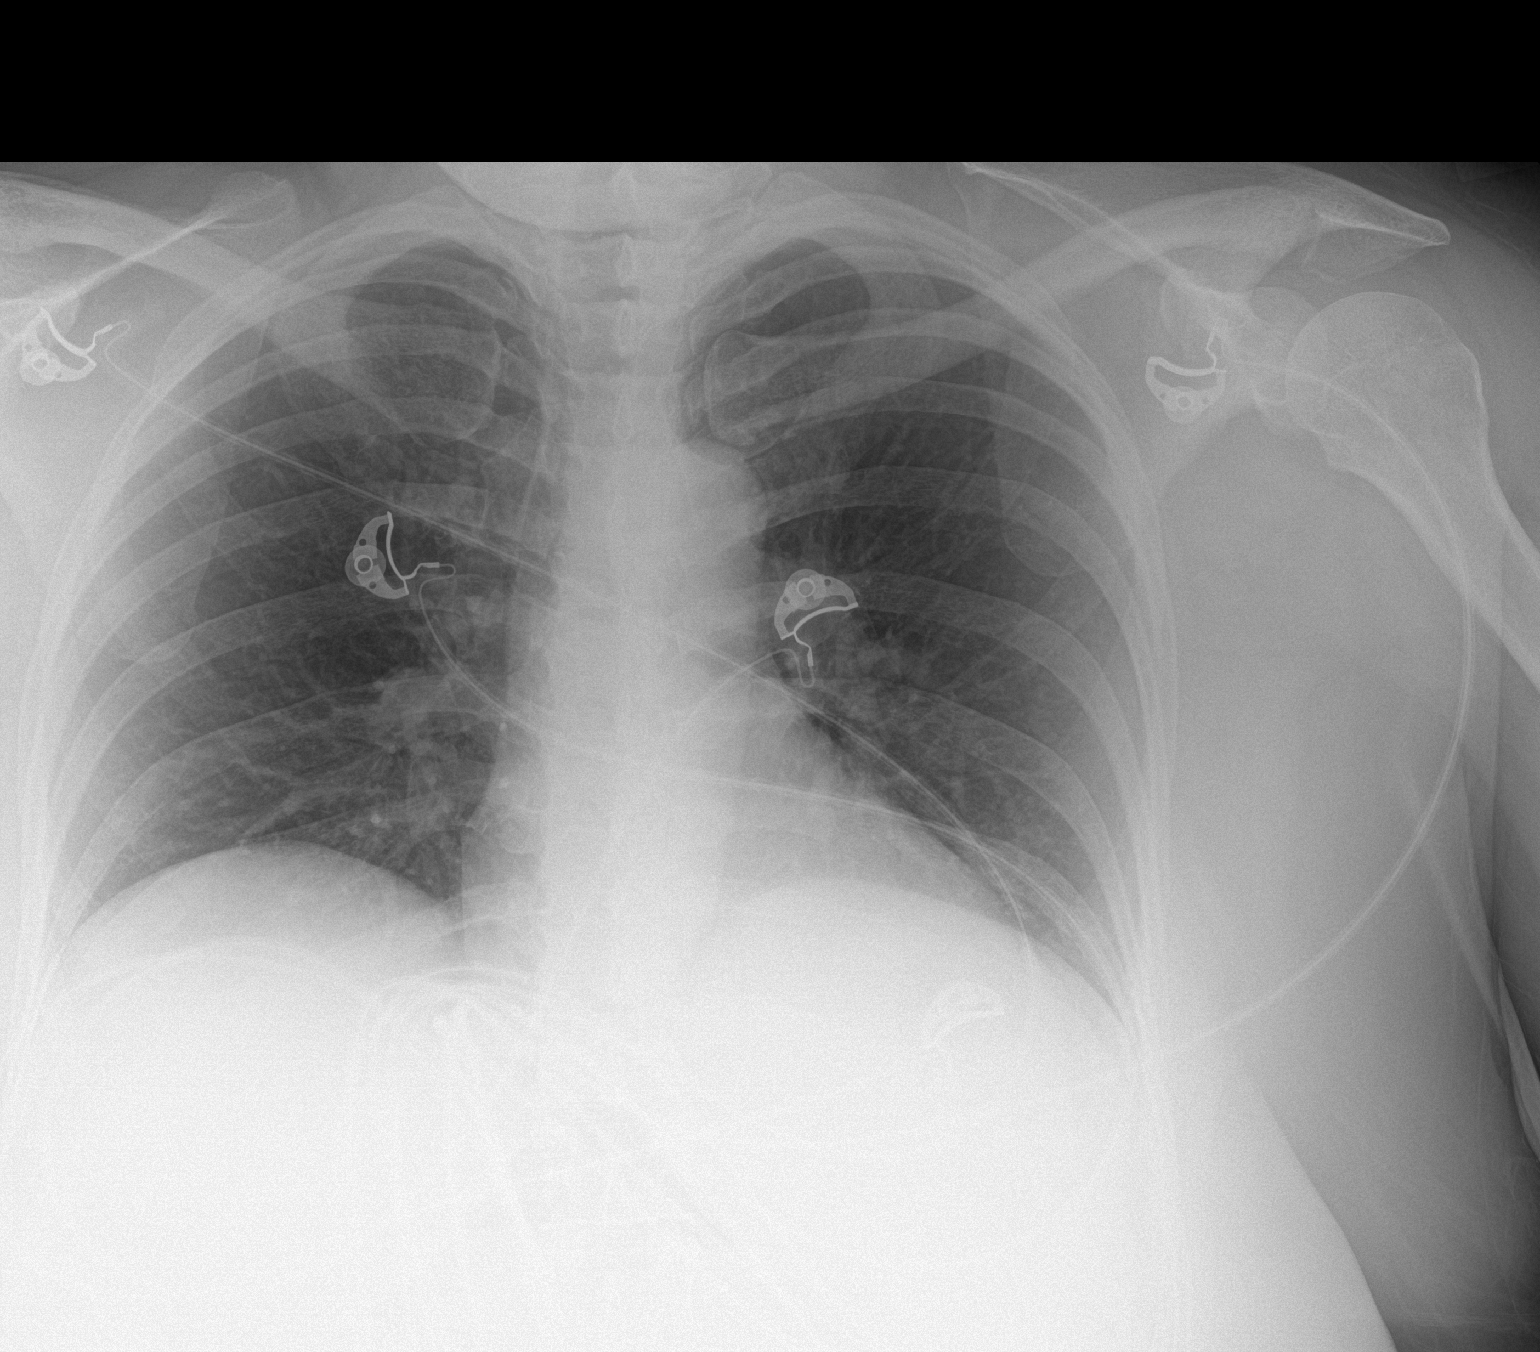

[1 of 1 positions shown; findings below may reference images not displayed]

FINDINGS: Cardiomediastinal silhouette is normal. Mediastinal contours appear
intact.

There is no evidence of focal airspace consolidation, pleural
effusion or pneumothorax.

Osseous structures are without acute abnormality. Soft tissues are
grossly normal.
IMPRESSION: No active disease.

## 2020-10-18 ENCOUNTER — Ambulatory Visit: Payer: BC Managed Care – PPO

## 2020-10-18 DIAGNOSIS — G35 Multiple sclerosis: Secondary | ICD-10-CM

## 2020-10-18 NOTE — Chronic Care Management (AMB) (Signed)
Chronic Care Management    Social Work Note  10/18/2020 Name: JADAH BOBAK MRN: [id number] DOB: Jan 21, 1966  [name] Dupree is a 55 y.o. year old female who is a primary care patient of [name], [place]. The CCM team was consulted to assist the patient with chronic disease management and/or care coordination needs related to: [employer] .   Engaged with patient by telephone for follow up visit in response to provider referral for social work chronic care management and care coordination services.   Consent to Services:  The patient was given information about Chronic Care Management services, agreed to services, and gave verbal consent prior to initiation of services.  Please see initial visit note for detailed documentation.   Patient agreed to services and consent obtained.   Assessment: Review of patient past medical history, allergies, medications, and health status, including review of relevant consultants reports was performed today as part of a comprehensive evaluation and provision of chronic care management and care coordination services.     SDOH (Social Determinants of Health) assessments and interventions performed:    Advanced Directives Status: Not addressed in this encounter.  CCM Care Plan  Allergies  Allergen Reactions  . Food Other (See Comments)    WHAT FOOD CAUSES THIS REACTION? Eyes swelling and throat swelling  . Oxycodone Hcl Hives  . Soy Allergy     Other reaction(s): SWELLING    Outpatient Encounter Medications as of 10/18/2020  Medication Sig  . B-12 MICROLOZENGE 500 MCG SUBL TAKE 2 TABLETS BY MOUTH EVERY DAY  . CVS B12 QUICK DISSOLVE 500 MCG LOZG TAKE 2 TABS BY MOUTH DAILY  . CVS D3 50 MCG (2000 UT) CAPS TAKE 1 CAPSULE BY MOUTH DAILY OR AS DIRECTED  . furosemide (LASIX) 40 MG tablet Take 1 tablet (40 mg total) by mouth daily. (Patient not taking: Reported on 09/24/2020)  . hydrochlorothiazide (HYDRODIURIL) 12.5 MG tablet TAKE 1 TABLET BY  MOUTH EVERY DAY  . Probiotic Product (PROBIOTIC PO) Take 1 Dose by mouth daily.  . TECFIDERA 240 MG CPDR Take 1 capsule by mouth twice daily  . TURMERIC-GINGER PO Take 2 each by mouth daily. Gummies   No facility-administered encounter medications on file as of 10/18/2020.    Patient Active Problem List   Diagnosis Date Noted  . Edema 08/29/2020  . Lymphedema 08/02/2020  . Stress 08/02/2020  . Gait abnormality 08/02/2020  . Acute urinary tract infection 10/28/2018  . High risk medication use 10/21/2016  . Urinary frequency 04/22/2016  . Multiple sclerosis ([place]) 02/01/2015  . Gait disturbance 02/01/2015  . Left foot drop 02/01/2015  . Other fatigue 02/01/2015  . DS (disseminated sclerosis) ([place]) 01/06/2013  . Avitaminosis D 01/06/2013    Conditions to be addressed/monitored: Multiple Sclerosis; home modification needs  Care Plan : Social Work Care Plan  Updates made by Daneen Schick since 10/18/2020 12:00 AM    Problem: Care Coordination   Priority: Medium  Onset Date: 08/29/2020    Goal: Follow up with primary care team regarding skin condition Completed 10/18/2020  Start Date: 08/29/2020  Expected End Date: 09/28/2020  Recent Progress: On track  Priority: High  Note:   Current Barriers:  . Difficulty contacting dermatologist  . Ongoing skin condition that has began oozing and is possibly spreading per patient report . Chronic conditions including Multiple Sclerosis and Vitamin B12 Deficiency which puts patient at increased risk of hospitalization  Social Work Clinical Goal(s):  [name] Over the next 2 days, patient  will follow up with Primary care provider as directed by SW Goal Met . Over the next 7 days the patient will follow up with dermatologist regarding ongoing care for legs  Interventions: . 1:1 collaboration with [name], [place] regarding development and update of comprehensive plan of care as evidenced by provider attestation and co-signature . Inter-disciplinary  care team collaboration (see longitudinal plan of care) . Patient reports she continues to be followed by dermatology and feels condition has improved . Goal met Patient Goals/Self-Care Activities Over the next 7 days, patient will:   - Patient will self administer medications as prescribed Contact dermatologist office to schedule a follow up appointment Contact SW as needed prior to next scheduled call  Follow up Plan: SW will follow up with patient by phone over the next 30 days    Long-Range Goal: Home modifications   Start Date: 08/29/2020  Expected End Date: 12/27/2020  Recent Progress: On track  Priority: Medium  Note:   Current Barriers:  . Financial constraints related to cost of home modifications  . ADL IADL limitations - unable to access bathroom independently due to modifications needed including walk-in shower and wider doorframes  . Chronic conditions including Multiple Sclerosis and Vitamin B12 Deficiency which put patient at increased risk of hospitalization  Social Work Clinical Goal(s):  [name] Over the next 120 days, patient will work with [facility] to address needs related to home modifications . New 2.3.22 Over the next 30 days the patient will follow up with [facility]) as directed by SW to determine if eligible for home modifications  Interventions: . 1:1 collaboration with [name], [place] regarding development and update of comprehensive plan of care as evidenced by provider attestation and co-signature . Inter-disciplinary care team collaboration (see longitudinal plan of care) . Successful outbound call placed to the patient to assess goal progression . Discussed the engineer with vocational rehab visited the patient home and determined they are unable to assist with a first floor walk in shower due to the first floor bathroom being without a tub at this time and inability to dig into the foundation to install  plumbing . Determined the patient remains interested in alternative resources as it is difficult to access the upstairs shower . Provided education on [facility] and discussed possibility the program may be able to assist with home modification needs . Provided the patient with the contact number to [facility] to determine eligibility and programs ability to install a walk in shower . Scheduled follow up call over the next 21 days  Patient Goals/Self-Care Activities Over the next 21 days, patient will:   - Patient will self administer medications as prescribed Patient will attend all scheduled provider appointments Patient will call provider office for new concerns or questions Engage with [facility] regarding home modifications Contact SW as needed prior to next scheduled call  Follow up Plan: SW will follow up with patient by phone over the next 21 days       Follow Up Plan: SW will follow up with patient by phone over the next 21 days      Daneen Schick, BSW, CDP Social Worker, Certified Dementia Practitioner [place] [facility] [place] [facility] [phone]

## 2020-10-18 NOTE — Patient Instructions (Signed)
  Goals we discussed today:  Goals Addressed            This Visit's Progress   . Home modifications       Timeframe:  Long-Range Goal Priority:  Medium Start Date:   12.15.21                          Expected End Date:  4.14.22                     Next date of contact: 2.24.22   Patient Goals/Self-Care Activities Over the next 21 days, patient will:   - Patient will self administer medications as prescribed Patient will attend all scheduled provider appointments Patient will call provider office for new concerns or questions Engage with [facility] regarding home modifications Contact SW as needed prior to next scheduled call    . COMPLETED: Receive care for skin condition       Timeframe:  Short-Term Goal Priority:  High Start Date:  12.15.21                           Expected End Date: 1.14.22                      Next date of contact: 1.20.21  Patient Goals/Self-Care Activities Over the next 7 days, patient will:   - Patient will self administer medications as prescribed Contact dermatologist office to schedule a follow up appointment Contact SW as needed prior to next scheduled call

## 2020-10-22 ENCOUNTER — Telehealth: Payer: Self-pay | Admitting: Neurology

## 2020-10-22 NOTE — Telephone Encounter (Signed)
I spoke with Dr. Felecia Shelling. He is fine with her doing a mychart VV for her appt this week if you can call and let her know and convert her appt? Thank you!

## 2020-10-22 NOTE — Telephone Encounter (Signed)
[name] - pt wanted to move this appt to April 18-22 in the afternoon because of transportation. If you can't make a change she will leave for 2/10. Or is it possible this can be a virtual visit? Lmk if you need me to call either way so she is aware of the change.  Thank you

## 2020-10-23 NOTE — Telephone Encounter (Signed)
Pt called, to discuss medication and change appt due to no transportation. Informed Pt we could change to MyChart visit. Pt agreed.   .. Pt understands that although there may be some limitations with this type of visit, we will take all precautions to reduce any security or privacy concerns.  Pt understands that this will be treated like an in office visit and we will file with pt's insurance, and there may be a patient responsible charge related to this service.

## 2020-10-23 NOTE — Telephone Encounter (Signed)
Noted  

## 2020-10-24 ENCOUNTER — Telehealth: Payer: BC Managed Care – PPO

## 2020-10-25 ENCOUNTER — Encounter: Payer: Self-pay | Admitting: Neurology

## 2020-10-25 ENCOUNTER — Telehealth (INDEPENDENT_AMBULATORY_CARE_PROVIDER_SITE_OTHER): Payer: BC Managed Care – PPO | Admitting: Neurology

## 2020-10-25 DIAGNOSIS — G35 Multiple sclerosis: Secondary | ICD-10-CM

## 2020-10-25 DIAGNOSIS — R269 Unspecified abnormalities of gait and mobility: Secondary | ICD-10-CM

## 2020-10-25 DIAGNOSIS — R5383 Other fatigue: Secondary | ICD-10-CM

## 2020-10-25 DIAGNOSIS — Z79899 Other long term (current) drug therapy: Secondary | ICD-10-CM

## 2020-10-25 NOTE — Progress Notes (Signed)
[facility]  PATIENT: [name] DOB: September 09, 1966  REFERRING CLINICIAN: Willey Blade HISTORY FROM: Patient and mother REASON FOR VISIT: Multiple sclerosis   HISTORICAL  CHIEF COMPLAINT:  No chief complaint on file.   HISTORY OF PRESENT ILLNESS:  Ms. Wallman is a 55 y.o. woman with relapsing remitting MS diagnosed in 1997.    Virtual Visit via Video Note I connected with Aracelys A Ybarra on 10/25/20 at  1:30 PM EST by a video enabled telemedicine application and verified that I am speaking with the correct person.  I discussed the limitations of evaluation and management by telemedicine and the availability of in person appointments. The patient expressed understanding and agreed to proceed.  Patient was in her home.  Provider was at the office.  History of Present Illness: Update 10/26/2019: She is on Tecfidera and tolerates it well.   She feels stable on Tecfidera 240 mg bid l.    Labs 06/15/2019 show lymphocyte count of 1.3.   She has had dry skin the past few months - likely unrelated.  She feels her MS has been stable.  She has no exacerbations or new symptoms.   Gait is the same.   She uses  A walker and can go 150 feet. She has bilateral leg weakness.  She has some mild spasticity.  She cannot walk more than a few steps without the walker.  She can get up from couch independently using a walker.    She had leg swelling and ultrasound was fine.   She is wearing compression stockings.     She has had no falls in last 6 months while transferring to the shower.   Arm strength is fine .   Bladder is doing well.    Vision is fine.     She has fatigue worse since Covid (09/15/2020).  She sleeps well most well.  She takes occasional naps.    She is eating well.   She takes vitamin D supplements.  She denies depression or anxiety.  Cognition is doing well.    She has LBP.    This bothers her more when she is standing up and walking  . She has ankle edema and  sometimes wears support hose.  She had Covid and felt under the weather and weaker x 5 days and more tired longer term.    Exam: She is a well-developed well-nourished woman in no acute distress.  The head is normocephalic and atraumatic.  Sclera are anicteric.  Visible skin appears normal.  The neck has a good range of motion.    She is alert and fully oriented with fluent speech and good attention, knowledge and memory.  Extraocular muscles are intact.  Facial strength is normal.  She appears to have normal strength in the arms.  Rapid alternating movements and finger-nose-finger are performed well.   MS History:    In 1997, she presented with backache, poor gait and visual blurring. She had an MRI and a lumbar puncture. The studies were consistent with multiple sclerosis.  She turned down a disease modifying therapy at that time. Her legs and her walking got completely better and she was back at baseline after the steroids. Over the next 15 years or so she had no additional symptoms. She had worsening symptoms in the left leg that was affecting her balance. MRI 2-3 years ago showed progression. She opted not to go on medication at that time.   She started Tecfidera around 2014  Images: MRI Brain 06/22/2017 shows T2/FLAIR hyperintense foci in the cerebral hemispheres and brainstem in a pattern and configuration consistent with chronic demyelinating plaque associated with multiple sclerosis. None of the foci appears to be acute. When compared to the MRI dated 06/06/2015, there is no interval change.     There is a normal enhancement pattern and there are no acute findings.  MRI cervical spine 06/22/2017 showed several lesions within the spinal cord adjacent to C2, C2-C3, C4 and C7 as described above. None of these enhance after contrast was administered. When compared to the MRI dated 06/06/2015, there has been no definite interval change.     Multilevel degenerative changes as detailed above. There is  mild spinal stenosis at C3-C4.  There is moderate right foraminal narrowing at C3-C4 and at C5-C6. The degenerative changes encroach upon the right C4 and C6 nerve roots though there is no definite nerve root compression.    Compared to the MRI dated 06/06/2015, there is no definite interval change.    There is a normal enhancement pattern.  MRI thoracic spine 06/22/2017 showed hyperintense foci within the spinal cord adjacent to C7, T3, T4, T4-T5, T7, T8-T9, T10 and T11 as described above. None of these appear to enhance after contrast was administered. These are consistent with chronic demyelinating plaque associated with multiple sclerosis.    Small disc protrusion at T7-T8 that does not lead to any nerve root compression.   REVIEW OF SYSTEMS:  Constitutional: No fevers, chills, sweats, or change in appetite Eyes: No visual changes, double vision, eye pain Ear, nose and throat: No hearing loss, ear pain, nasal congestion, sore throat Cardiovascular: No chest pain, palpitations Respiratory:  No shortness of breath at rest or with exertion.   No wheezes GastrointestinaI: No nausea, vomiting, diarrhea, abdominal pain, fecal incontinence Genitourinary:  No dysuria, urinary retention or frequency.  No nocturia. Musculoskeletal:  No neck pain.   She reports some back pain and occasionally proximal leg pain. Integumentary: No rash, pruritus, skin lesions Neurological: as above Psychiatric: She denies depression but notes that she is sometimes moody. There is no anxiety.  She notes some marital stress Endocrine: No palpitations, diaphoresis, change in appetite or increased thirst Hematologic/Lymphatic:  No anemia, purpura, petechiae. Allergic/Immunologic: No itchy/runny eyes, nasal congestion, recent allergic reactions, rashes  ALLERGIES: Allergies  Allergen Reactions  . Food Other (See Comments)    WHAT FOOD CAUSES THIS REACTION? Eyes swelling and throat swelling  . Oxycodone Hcl Hives  . Soy  Allergy     Other reaction(s): SWELLING    HOME MEDICATIONS: Outpatient Medications Prior to Visit  Medication Sig Dispense Refill  . B-12 MICROLOZENGE 500 MCG SUBL TAKE 2 TABLETS BY MOUTH EVERY DAY 180 tablet 1  . [facility] B12 QUICK DISSOLVE 500 MCG LOZG TAKE 2 TABS BY MOUTH DAILY 180 lozenge 1  . [facility] D3 50 MCG (2000 UT) CAPS TAKE 1 CAPSULE BY MOUTH DAILY OR AS DIRECTED 90 capsule 3  . furosemide (LASIX) 40 MG tablet Take 1 tablet (40 mg total) by mouth daily. (Patient not taking: Reported on 09/24/2020) 7 tablet 0  . hydrochlorothiazide (HYDRODIURIL) 12.5 MG tablet TAKE 1 TABLET BY MOUTH EVERY DAY 90 tablet 1  . Probiotic Product (PROBIOTIC PO) Take 1 Dose by mouth daily.    . TECFIDERA 240 MG CPDR Take 1 capsule by mouth twice daily 60 capsule 11  . TURMERIC-GINGER PO Take 2 each by mouth daily. Gummies     No facility-administered medications prior to  visit.    PAST MEDICAL HISTORY: Past Medical History:  Diagnosis Date  . Movement disorder   . Multiple sclerosis ([place])     PAST SURGICAL HISTORY: Past Surgical History:  Procedure Laterality Date  . CESAREAN SECTION    . UTERINE FIBROID SURGERY    . WISDOM TOOTH EXTRACTION      FAMILY HISTORY: Family History  Problem Relation Age of Onset  . Hypertension Mother   . Hypertension Father   . Diabetes Father        DIAGNOSTIC DATA (LABS, IMAGING, TESTING) - I reviewed patient records, labs, notes, testing and imaging myself where available.  Lab Results  Component Value Date   WBC 9.7 08/02/2020   HGB 15.4 08/02/2020   HCT 45.8 08/02/2020   MCV 85 08/02/2020   PLT 310 08/02/2020      ASSESSMENT AND PLAN  Multiple sclerosis ([place]) - Plan: MR BRAIN WO CONTRAST, MR CERVICAL SPINE WO CONTRAST  Gait abnormality - Plan: MR BRAIN WO CONTRAST, MR CERVICAL SPINE WO CONTRAST  High risk medication use  Other fatigue  1.   Continue Tecfidera.  CBC with differential was fine 07/2020.   will recheck next visit.  Check  MRI of the brain and cervical spine to determine if there is any subclinical progression and consider a different disease modifying agent depending on the results. 2.   Continue supplementary vitamin D and B12.. 3.   She will use a walker around the house and wheelchair outside the house. 4.   rtc 6 months, sooner if problems    Follow Up Instructions: I discussed the assessment and treatment plan with the patient. The patient was provided an opportunity to ask questions and all were answered. The patient agreed with the plan and demonstrated an understanding of the instructions.    The patient was advised to call back or seek an in-person evaluation if the symptoms worsen or if the condition fails to improve as anticipated.  I provided 25 minutes of non-face-to-face time during this encounter.   Delshawn Stech A. Felecia Shelling, MD, PhD 0/76/2263, 3:35 PM Certified in Neurology, Clinical Neurophysiology, Sleep Medicine, Pain Medicine and Neuroimaging  [facility] [address], [place], [place] [address] [phone]

## 2020-10-29 ENCOUNTER — Telehealth: Payer: Self-pay | Admitting: Neurology

## 2020-10-29 NOTE — Telephone Encounter (Signed)
[employer] medicare/BCBS order sent to [facility]. No auth they will reach out to the patient to schedule.

## 2020-10-30 ENCOUNTER — Ambulatory Visit (INDEPENDENT_AMBULATORY_CARE_PROVIDER_SITE_OTHER): Payer: BC Managed Care – PPO | Admitting: Psychology

## 2020-10-30 DIAGNOSIS — F064 Anxiety disorder due to known physiological condition: Secondary | ICD-10-CM

## 2020-10-30 DIAGNOSIS — Z630 Problems in relationship with spouse or partner: Secondary | ICD-10-CM | POA: Diagnosis not present

## 2020-10-31 ENCOUNTER — Other Ambulatory Visit: Payer: Self-pay | Admitting: *Deleted

## 2020-10-31 DIAGNOSIS — G35 Multiple sclerosis: Secondary | ICD-10-CM

## 2020-10-31 MED ORDER — TECFIDERA 240 MG PO CPDR: DELAYED_RELEASE_CAPSULE | ORAL | 11 refills | Status: DC

## 2020-10-31 NOTE — Telephone Encounter (Signed)
Called pt back. Advised we attempted PA a couple weeks ago, denied stating no PA needed. Rx limited to 30 days supply at a time. She states pharmacy does not have this on file. I resent and asked her to call back if [facility] has any more issues filling for her. She verbalized understanding.

## 2020-10-31 NOTE — Telephone Encounter (Signed)
Pt called wanting to know an update on the PA for the Tecfidera. Please advise.

## 2020-11-01 ENCOUNTER — Other Ambulatory Visit: Payer: Self-pay | Admitting: Neurology

## 2020-11-05 ENCOUNTER — Telehealth: Payer: Self-pay | Admitting: Neurology

## 2020-11-05 NOTE — Telephone Encounter (Signed)
Pt asking for a refill on her [place] she is asking for generic to [facility]

## 2020-11-05 NOTE — Telephone Encounter (Signed)
I called [facility] and spoke with Legrand Como. He transferred me to pharmacy tech, [place]. Asked for update on them filling rx Tecfidera (brand). She states PA needed. Advised we already attempted PA on 10/11/20. We got the following response: ""[id number] case has been cancelled for Brand Tecfidera 240mg  capsule, use as directed, for the following reason: For further assistance in processing this claim, the pharmacy may contact Help Desk at [phone].Reviewed by: [email or web address], RPh"  I called and was told rx limited to 30 days supply. She took this info and will follow up to see if they can get paid claim. They will call our office back if they run into any further issues.

## 2020-11-06 NOTE — Telephone Encounter (Addendum)
Called [facility] back. Spoke with Tye [place] who transferred me to [place] team (phone # [phone]) I spoke with Seth Bake. She states Shantell called our office back yesterday and spoke with [facility]. I advised we do not have employee by that name. May have called wrong office. She states Shantell was calling to let us know brand name needed PA, but generic did not. They are still getting rejection at pharmacy, stating PA needed for Brand name even though insurance cx previous PA on January, stating no PA needed. She transferred me to pharmacist so we could give VO for generic since patient agreeable to change to generic. Spoke with [facility]. Provided VO for dimethyl fumarate 240mg  po BID #60, 11 refills. They will start processing for pt. Nothing further needed at this time.   Called pt. Advised I provided VO for generic. She is going to call them to ask them to expedite order. Taking last pill today. She will call back and provide update as to whether she has a high copay cost or not.

## 2020-11-06 NOTE — Telephone Encounter (Addendum)
Even though [facility] stated no PA was needed for generic dimethyl fumarate, we received a PA request on CMM. Key: [place]. Submitted PA for urgent review. Received instant approval: [id number];Review Type:Prior Auth;Coverage Start Date:10/07/2020;Coverage End Date:11/06/2021;

## 2020-11-06 NOTE — Telephone Encounter (Signed)
Pt called [facility] to make sure that the generic brand was called in since her insurance does not cover the brand. Pt would like RN to call her back when available.

## 2020-11-06 NOTE — Addendum Note (Signed)
Addended by: Wyvonnia Lora on: 11/06/2020 10:12 AM   Modules accepted: Orders

## 2020-11-08 ENCOUNTER — Ambulatory Visit: Payer: BC Managed Care – PPO

## 2020-11-08 DIAGNOSIS — R269 Unspecified abnormalities of gait and mobility: Secondary | ICD-10-CM

## 2020-11-08 DIAGNOSIS — G35 Multiple sclerosis: Secondary | ICD-10-CM

## 2020-11-08 NOTE — Chronic Care Management (AMB) (Signed)
Social Work Note  11/08/2020 Name: ARLYCE CIRCLE MRN: [id number] DOB: 10/02/1965  [name] Folk is a 55 y.o. year old female who is a primary care patient of [name], [place].  The Care Management team was consulted for assistance with chronic disease management and care coordination needs.  Ms. Trindade was given information about Care Management services today including:  1. Care Management services include personalized support from designated clinical staff supervised by her physician, including individualized plan of care and coordination with other care providers 2. 24/7 contact phone numbers for assistance for urgent and routine care needs. 3. The patient may stop care management services at any time (effective at the end of the month) by phone call to the office staff.  Patient agreed to services and consent obtained.   Engaged with patient by telephone for follow up visit in response to provider referral for social work chronic care management and care coordination services.  Assessment: Review of patient past medical history, allergies, medications, and health status, including review of pertinent consultant reports was performed as part of comprehensive evaluation and provision of care management/care coordination services.   SDOH (Social Determinants of Health) assessments and interventions performed:  Yes SDOH Interventions   Flowsheet Row Most Recent Value  SDOH Interventions   Food Insecurity Interventions Intervention Not Indicated  Housing Interventions Intervention Not Indicated  Transportation Interventions Intervention Not Indicated       Advanced Directives Status: Not addressed in this encounter.  Care Plan  Allergies  Allergen Reactions  . Food Other (See Comments)    WHAT FOOD CAUSES THIS REACTION? Eyes swelling and throat swelling  . Oxycodone Hcl Hives  . Soy Allergy     Other reaction(s): SWELLING    Outpatient Encounter Medications as of  11/08/2020  Medication Sig Note  . B-12 MICROLOZENGE 500 MCG SUBL TAKE 2 TABLETS BY MOUTH EVERY DAY   . Cholecalciferol (VITAMIN D3) 50 MCG (2000 UT) TABS TAKE 1 TABLET BY MOUTH EVERY DAY OR AS DIRECTED   . [facility] B12 QUICK DISSOLVE 500 MCG LOZG TAKE 2 TABS BY MOUTH DAILY   . Dimethyl Fumarate 240 MG CPDR Take 240 mg by mouth 2 (two) times daily. 11/06/2020: CaseId:[id number];Status:Approved;Review Type:Prior Auth;Coverage Start Date:10/07/2020;Coverage End Date:11/06/2021;  . furosemide (LASIX) 40 MG tablet Take 1 tablet (40 mg total) by mouth daily. (Patient not taking: Reported on 09/24/2020)   . hydrochlorothiazide (HYDRODIURIL) 12.5 MG tablet TAKE 1 TABLET BY MOUTH EVERY DAY   . Probiotic Product (PROBIOTIC PO) Take 1 Dose by mouth daily.   . TURMERIC-GINGER PO Take 2 each by mouth daily. Gummies    No facility-administered encounter medications on file as of 11/08/2020.    Patient Active Problem List   Diagnosis Date Noted  . Edema 08/29/2020  . Lymphedema 08/02/2020  . Stress 08/02/2020  . Gait abnormality 08/02/2020  . Acute urinary tract infection 10/28/2018  . High risk medication use 10/21/2016  . Urinary frequency 04/22/2016  . Multiple sclerosis ([place]) 02/01/2015  . Gait disturbance 02/01/2015  . Left foot drop 02/01/2015  . Other fatigue 02/01/2015  . DS (disseminated sclerosis) ([place]) 01/06/2013  . Avitaminosis D 01/06/2013    Conditions to be addressed/monitored: Multiple Sclerosis and Vitamin B12 Deficiency; care coordination  Care Plan : Social Work Care Plan  Updates made by Daneen Schick since 11/08/2020 12:00 AM    Problem: Care Coordination Deleted 11/08/2020  Priority: Medium  Onset Date: 08/29/2020    Long-Range Goal: Home modifications Completed  11/08/2020  Start Date: 08/29/2020  Expected End Date: 12/27/2020  Recent Progress: On track  Priority: Medium  Note:   Current Barriers:  . Financial constraints related to cost of home modifications  . ADL IADL  limitations - unable to access bathroom independently due to modifications needed including walk-in shower and wider doorframes  . Chronic conditions including Multiple Sclerosis and Vitamin B12 Deficiency which put patient at increased risk of hospitalization  Social Work Clinical Goal(s):  [name] Over the next 120 days, patient will work with [facility] to address needs related to home modifications . New 2.3.22 Over the next 30 days the patient will follow up with [facility]) as directed by SW to determine if eligible for home modifications  Interventions: . 1:1 collaboration with [name], [place] regarding development and update of comprehensive plan of care as evidenced by provider attestation and co-signature . Inter-disciplinary care team collaboration (see longitudinal plan of care) . Successful outbound call placed to the patient to assess goal progression . Discussed the patient has been in contact with [facility] to determine she is not eligible to receive services desired to assist with home modifications  . Reviewed patient plan to continue using downstairs half bath for wash ups and will utilize the help of her daughter on good days to access upstairs shower . Goal closed due to inability to locate resources  Patient Goals/Self-Care Activities Over the next 21 days, patient will:   - Patient will self administer medications as prescribed Patient will attend all scheduled provider appointments Patient will call provider office for new concerns or questions Engage with [facility] regarding home modifications Contact SW as needed prior to next scheduled call  Follow up Plan: Goal closed    Problem: Quality of Life (General Plan of Care)     Long-Range Goal: Quality of Life Maintained   Start Date: 11/08/2020  Expected End Date: 05/17/2021  This Visit's Progress: On track  Priority: Low  Note:   Current Barriers:  . Chronic disease  management support and education needs related to  Multiple Sclerosis and Vitamin B12 Deficiency   . ADL IADL limitations  Social Worker Clinical Goal(s):  [name] Over the next 120 days, patient will work with SW to identify and address any acute and/or chronic care coordination needs related to the self health management of  Multiple Sclerosis and Vitamin B12 Deficiency    CCM SW Interventions:  . Inter-disciplinary care team collaboration (see longitudinal plan of care) . Collaboration with [name], FNP regarding development and update of comprehensive plan of care as evidenced by provider attestation and co-signature . Successful outbound call placed to the patient to assess for care coordination needs . Discussed the patient continues to do well in the home with not acute resource needs . Discussed long term follow up with SW while patient remains actively involved with  RN Case Manager  to address care management needs . Scheduled follow up call over the next 60 days Patient Goals/Self-Care Activities . Over the next 60 days, patient will:   - Patient will self administer medications as prescribed Patient will attend all scheduled provider appointments Patient will call provider office for new concerns or questions Contact SW as needed prior to next scheduled call Follow Up Plan:  SW will follow up with the patient over the next 60 days       Follow Up Plan: SW will follow up with patient by phone over the next  60 days.      Daneen Schick, BSW, CDP Social Worker, Certified Dementia Practitioner [place] [facility] [place] [facility] [phone]

## 2020-11-08 NOTE — Patient Instructions (Signed)
   Goals we discussed today:  Goals Addressed            This Visit's Progress   . COMPLETED: Home modifications       Timeframe:  Long-Range Goal Priority:  Medium Start Date:   12.15.21                          Expected End Date:  4.14.22                     2.24.22- Goal closed due to inability to locate a resource able to move plumbing and perform needed tasks to accomplish desired modifications   Patient Goals/Self-Care Activities Over the next 21 days, patient will:   - Patient will self administer medications as prescribed Patient will attend all scheduled provider appointments Patient will call provider office for new concerns or questions Engage with [facility] regarding home modifications Contact SW as needed prior to next scheduled call    . Quality of life maintained       Timeframe:  Long-Range Goal Priority:  Low Start Date:  2.24.22                           Expected End Date:   9.2.22                    Next planned outreach date: 4.22.22  Patient Goals/Self-Care Activities . Over the next 60 days, patient will:   - Patient will self administer medications as prescribed Patient will attend all scheduled provider appointments Patient will call provider office for new concerns or questions Contact SW as needed prior to next scheduled call

## 2020-11-09 NOTE — Telephone Encounter (Signed)
Pt called wanting to inform the RN that she was able to get the medication yesterday and will be starting it today. Pt states that the copay was $5.33.

## 2020-11-12 NOTE — Telephone Encounter (Signed)
Noted, this was for the generic, see other phone note.

## 2020-11-19 ENCOUNTER — Ambulatory Visit (INDEPENDENT_AMBULATORY_CARE_PROVIDER_SITE_OTHER): Payer: BC Managed Care – PPO | Admitting: Psychology

## 2020-11-19 ENCOUNTER — Telehealth: Payer: Self-pay | Admitting: Neurology

## 2020-11-19 ENCOUNTER — Encounter: Payer: Self-pay | Admitting: *Deleted

## 2020-11-19 DIAGNOSIS — F064 Anxiety disorder due to known physiological condition: Secondary | ICD-10-CM | POA: Diagnosis not present

## 2020-11-19 DIAGNOSIS — Z630 Problems in relationship with spouse or partner: Secondary | ICD-10-CM

## 2020-11-19 NOTE — Telephone Encounter (Signed)
[name], looks like she is scheduled for 12/07/20. She can r/s MRI to work around what works for her. Can you call her back? Thank you

## 2020-11-19 NOTE — Telephone Encounter (Signed)
Pt. states that she has to cancel MRI due to a funeral. She states she is not sure if she needed this to be done so soon, due to needing a generic pres. Please advise.

## 2020-11-19 NOTE — Telephone Encounter (Signed)
I spoke with the patient and informed her she can give [facility] a call at anytime to get her MRI's r/s she stated she would do that. I informed her she can r/s them that would work for her.

## 2020-11-22 ENCOUNTER — Telehealth: Payer: BC Managed Care – PPO

## 2020-12-07 ENCOUNTER — Other Ambulatory Visit: Payer: Medicare Other

## 2020-12-24 ENCOUNTER — Ambulatory Visit: Payer: Self-pay

## 2020-12-24 ENCOUNTER — Telehealth: Payer: BC Managed Care – PPO

## 2020-12-24 DIAGNOSIS — R609 Edema, unspecified: Secondary | ICD-10-CM

## 2020-12-24 DIAGNOSIS — G35 Multiple sclerosis: Secondary | ICD-10-CM

## 2020-12-24 DIAGNOSIS — M21372 Foot drop, left foot: Secondary | ICD-10-CM

## 2021-01-03 ENCOUNTER — Other Ambulatory Visit: Payer: Medicare Other

## 2021-01-04 ENCOUNTER — Telehealth: Payer: BC Managed Care – PPO

## 2021-01-08 NOTE — Patient Instructions (Signed)
Goals Addressed    .  COMPLETED: Manage swelling and skin rash (pt-stated)        Timeframe:  Long-Range Goal Priority:  Medium Start Date: 09/24/20                            Expected End Date:  11/19/20   Next follow up date:  10/24/20  Over the next 60 days, patient will:   - continue to apply prescribed cream to lower extremities  - continue to elevate lower extremities while resting - report new or worsening edema or skin rash/wound to PCP  or CCM team member promptly - continue to stay well hydrated                          Other   .  Multiple Sclerosis - disease progression minimized or prevented   On track     Timeframe:  Long-Range Goal Priority:  High Start Date:  12/24/20                           Expected End Date:  06/25/21   Next Follow Up date: 04/29/21     Self Care Activities:  . Continue to keep all scheduled follow up appointments . Take medications as directed  . Let your healthcare team know if you are unable to take your medications . Call your pharmacy for refills at least 7 days prior to running out of medication . Notify Dr. Felecia Shelling promptly for signs/symptoms suggestive of MS exacerbation  Patient Goals: - to maintain as much independence as possible

## 2021-01-08 NOTE — Chronic Care Management (AMB) (Signed)
Care Management    RN Visit Note  12/24/2020 Name: [name] MRN: [id number] DOB: Apr 14, 1966  Subjective: [name] is a 55 y.o. year old female who is a primary care patient of [name], [place]. The care management team was consulted for assistance with disease management and care coordination needs.    Engaged with patient by telephone for follow up visit in response to provider referral for case management and/or care coordination services.   Consent to Services:   Ms. Manzo was given information about Care Management services today including:  1. Care Management services includes personalized support from designated clinical staff supervised by her physician, including individualized plan of care and coordination with other care providers 2. 24/7 contact phone numbers for assistance for urgent and routine care needs. 3. The patient may stop case management services at any time by phone call to the office staff.  Patient agreed to services and consent obtained.   Assessment: Review of patient past medical history, allergies, medications, health status, including review of consultants reports, laboratory and other test data, was performed as part of comprehensive evaluation and provision of chronic care management services.   SDOH (Social Determinants of Health) assessments and interventions performed:  Yes, no acute needs   Care Plan  Allergies  Allergen Reactions  . Food Other (See Comments)    WHAT FOOD CAUSES THIS REACTION? Eyes swelling and throat swelling  . Oxycodone Hcl Hives  . Soy Allergy     Other reaction(s): SWELLING    Outpatient Encounter Medications as of 12/24/2020  Medication Sig Note  . B-12 MICROLOZENGE 500 MCG SUBL TAKE 2 TABLETS BY MOUTH EVERY DAY   . Cholecalciferol (VITAMIN D3) 50 MCG (2000 UT) TABS TAKE 1 TABLET BY MOUTH EVERY DAY OR AS DIRECTED   . [facility] B12 QUICK DISSOLVE 500 MCG LOZG TAKE 2 TABS BY MOUTH DAILY   . Dimethyl Fumarate  240 MG CPDR Take 240 mg by mouth 2 (two) times daily. 11/06/2020: CaseId:[id number];Status:Approved;Review Type:Prior Auth;Coverage Start Date:10/07/2020;Coverage End Date:11/06/2021;  . furosemide (LASIX) 40 MG tablet Take 1 tablet (40 mg total) by mouth daily. (Patient not taking: Reported on 09/24/2020)   . hydrochlorothiazide (HYDRODIURIL) 12.5 MG tablet TAKE 1 TABLET BY MOUTH EVERY DAY   . Probiotic Product (PROBIOTIC PO) Take 1 Dose by mouth daily.   . TURMERIC-GINGER PO Take 2 each by mouth daily. Gummies    No facility-administered encounter medications on file as of 12/24/2020.    Patient Active Problem List   Diagnosis Date Noted  . Edema 08/29/2020  . Lymphedema 08/02/2020  . Stress 08/02/2020  . Gait abnormality 08/02/2020  . Acute urinary tract infection 10/28/2018  . High risk medication use 10/21/2016  . Urinary frequency 04/22/2016  . Multiple sclerosis ([place]) 02/01/2015  . Gait disturbance 02/01/2015  . Left foot drop 02/01/2015  . Other fatigue 02/01/2015  . DS (disseminated sclerosis) ([place]) 01/06/2013  . Avitaminosis D 01/06/2013    Conditions to be addressed/monitored: MS, Edema, Foot drop  Care Plan : General Plan of Care (Adult)  Updates made by Lynne Logan, RN since 01/08/2021 12:00 AM  Completed 01/08/2021  Problem: COVID 19 Resolved 12/24/2020  Priority: High    Goal: Manage COVID 19 symptoms Completed 12/24/2020  Start Date: 09/20/2020  Expected End Date: 10/28/2020  Recent Progress: On track  Priority: High  Note:   Current Barriers:  [name] Knowledge Deficits related to COVID-19 and impact on patient self health management . Unable  to perform IADLs independently Clinical Goal(s):  [name] Over the next 30 days, patient will verbalize basic understanding of COVID-19 impact on individual health and self health management as evidenced by verbalization of basic understanding of COVID-19 as a viral disease, measures to prevent exposure, signs and symptoms, when to  contact provider Interventions: 12/24/20 completed successful call with patient . Determined patient has made a complete recovery from [place] 19 and voices no further concerns at this time  . Discussed plans with patient for ongoing care management follow up and provided patient with direct contact information for care management team Patient Goals/Self-Care Activities Continue to monitor her Oxygen level at home and call PCP if level drops below 90 - report new or worsening shortness of breath or chest pain promptly - stay well hydrated with 70/30 Gatorade/water daily  - continue OTC vitamins and Expectorant as directed by PCP - balance activity with rest - continue services from [facility] as recommended by PCP for close monitoring of COVID 19 symptoms        - use incentive spirometer as directed  - call Dr. Felecia Shelling to schedule MS f/u visit   - receive COVID [place] booster vaccine as scheduled in home on 09/25/20      Follow Up Plan: no further follow up required    Care Plan : Impaired Skin Integrity  Updates made by Lynne Logan, RN since 01/08/2021 12:00 AM    Problem: Skin Integrity     Long-Range Goal: Skin Integrity Maintained Completed 12/24/2020  Start Date: 09/24/2020  Expected End Date: 11/19/2020  Recent Progress: On track  Priority: Medium  Note:   Current Barriers:   Ineffective Self Health Maintenance  Unable to perform IADLs independently  Currently UNABLE TO independently self manage needs related to chronic health conditions.   Knowledge Deficits related to short term plan for care coordination needs and long term plans for chronic disease management needs Nurse Case Manager Clinical Goal(s):   Over the next 60 days, patient will work with care management team to address care coordination and chronic disease management needs related to Disease Management  Educational Needs  Care Coordination  Medication Management and Education  Psychosocial Support    Interventions:  12/24/20 completed successful call with patient   Determined patient's skin is clean, dry and intact at this time  Determined patient reports having decreased swelling to her lower extremities and continues to wear compression stockings  Educated patient on importance of elevating lower extremities while resting, and to notify PCP and or CCM team member if rash, swelling or open wounds reoccur promptly  Patient Goals/Self Care Activities:  Over the next 60 days, patient will:  - continue to apply prescribed cream to lower extremities  - continue to elevate lower extremities while resting - report new or worsening edema or skin rash/wound to PCP  or CCM team member promptly - continue to stay well hydrated    Follow Up Plan: no further follow up    Care Plan : Multiple Sclerosis  Updates made by Lynne Logan, RN since 01/08/2021 12:00 AM    Problem: Multiple Sclerosis   Priority: High    Long-Range Goal: Multiple Sclerosis - disease progression minimized or prevented   Start Date: 12/24/2020  Expected End Date: 06/25/2021  This Visit's Progress: On track  Priority: High  Note:   Current Barriers:   Ineffective Self Health Maintenance   Unable to perform ADLs independently  Unable to perform IADLs independently  Impaired Physical Mobility  Clinical Goal(s):  [name] Collaboration with [name], FNP regarding development and update of comprehensive plan of care as evidenced by provider attestation and co-signature . Inter-disciplinary care team collaboration (see longitudinal plan of care)  patient will work with care management team to address care coordination and chronic disease management needs related to Disease Management  Educational Needs  Care Coordination  Medication Management and Education  Psychosocial Support   Interventions:   Evaluation of current treatment plan related to  Multiple Sclerosis , self-management and patient's adherence  to plan as established by provider.  Collaboration with [name], FNP regarding development and update of comprehensive plan of care as evidenced by provider attestation       and co-signature  Inter-disciplinary care team collaboration (see longitudinal plan of care) . Provided education to patient about basic disease process related to Multiple Sclerosis . Review of patient status, including review of consultants reports, relevant laboratory and other test results, and medications completed. . Reviewed medications with patient and discussed importance of medication adherence . Reviewed scheduled brain MRI per Dr. Felecia Shelling scheduled for 03/26/21 @2 :10 PM; follow up with Dr. Felecia Shelling scheduled for 04/25/21 @11 :00 AM   Discussed plans with patient for ongoing care management follow up and provided patient with direct contact information for care management team Self Care Activities:  . Continue to keep all scheduled follow up appointments . Take medications as directed  . Let your healthcare team know if you are unable to take your medications . Call your pharmacy for refills at least 7 days prior to running out of medication . Notify Dr. Felecia Shelling promptly for signs/symptoms suggestive of MS exacerbation  Patient Goals: - to maintain as much independence as possible  Follow Up Plan: Telephone appointment with care management team member scheduled for:  04/29/21     Plan: Telephone follow up appointment with care management team member scheduled for:  04/29/21  Barb Merino, RN, BSN, CCM Care Management Coordinator [place] [facility]  Direct Phone: [phone]

## 2021-01-11 ENCOUNTER — Ambulatory Visit: Payer: BC Managed Care – PPO

## 2021-01-11 DIAGNOSIS — G35 Multiple sclerosis: Secondary | ICD-10-CM

## 2021-01-11 NOTE — Patient Instructions (Signed)
Visit Information  Goals Addressed            This Visit's Progress   . Quality of life maintained       Timeframe:  Long-Range Goal Priority:  Low Start Date:  2.24.22                           Expected End Date:   9.2.22                    Next planned outreach date: 7.29.22  Patient Goals/Self-Care Activities . Over the next 90 days, patient will:   - Patient will self administer medications as prescribed Patient will attend all scheduled provider appointments Patient will call provider office for new concerns or questions Contact SW as needed prior to next scheduled call       Patient verbalizes understanding of instructions provided today and agrees to view in [place].   SW will follow up with the patient over the next 90 days  Daneen Schick, BSW, CDP Social Worker, Certified Dementia Practitioner [place] [facility] [place] [facility] [phone]

## 2021-01-11 NOTE — Chronic Care Management (AMB) (Signed)
Social Work Note  01/11/2021 Name: [name] MRN: [id number] DOB: 17-Jan-1966  [name] is a 55 y.o. year old female who is a primary care patient of [name], [place].  The Care Management team was consulted for assistance with chronic disease management and care coordination needs.  Ms. Weiland was given information about Care Management services today including:  1. Care Management services include personalized support from designated clinical staff supervised by her physician, including individualized plan of care and coordination with other care providers 2. 24/7 contact phone numbers for assistance for urgent and routine care needs. 3. The patient may stop care management services at any time (effective at the end of the month) by phone call to the office staff.  Patient agreed to services and consent obtained.   Engaged with patient by telephone for follow up visit in response to provider referral for social work chronic care management and care coordination services.  Assessment: Review of patient past medical history, allergies, medications, and health status, including review of pertinent consultant reports was performed as part of comprehensive evaluation and provision of care management/care coordination services.   SDOH (Social Determinants of Health) assessments and interventions performed:  Yes SDOH Interventions   Flowsheet Row Most Recent Value  SDOH Interventions   Food Insecurity Interventions Intervention Not Indicated  Housing Interventions Intervention Not Indicated  Transportation Interventions Intervention Not Indicated       Advanced Directives Status: Not addressed in this encounter.  Care Plan  Allergies  Allergen Reactions  . Food Other (See Comments)    WHAT FOOD CAUSES THIS REACTION? Eyes swelling and throat swelling  . Oxycodone Hcl Hives  . Soy Allergy     Other reaction(s): SWELLING    Outpatient Encounter Medications as of  01/11/2021  Medication Sig Note  . B-12 MICROLOZENGE 500 MCG SUBL TAKE 2 TABLETS BY MOUTH EVERY DAY   . Cholecalciferol (VITAMIN D3) 50 MCG (2000 UT) TABS TAKE 1 TABLET BY MOUTH EVERY DAY OR AS DIRECTED   . [facility]S B12 QUICK DISSOLVE 500 MCG LOZG TAKE 2 TABS BY MOUTH DAILY   . Dimethyl Fumarate 240 MG CPDR Take 240 mg by mouth 2 (two) times daily. 11/06/2020: CaseId:[id number];Status:Approved;Review Type:Prior Auth;Coverage Start Date:10/07/2020;Coverage End Date:11/06/2021;  . furosemide (LASIX) 40 MG tablet Take 1 tablet (40 mg total) by mouth daily. (Patient not taking: Reported on 09/24/2020)   . hydrochlorothiazide (HYDRODIURIL) 12.5 MG tablet TAKE 1 TABLET BY MOUTH EVERY DAY   . Probiotic Product (PROBIOTIC PO) Take 1 Dose by mouth daily.   . TURMERIC-GINGER PO Take 2 each by mouth daily. Gummies    No facility-administered encounter medications on file as of 01/11/2021.    Patient Active Problem List   Diagnosis Date Noted  . Edema 08/29/2020  . Lymphedema 08/02/2020  . Stress 08/02/2020  . Gait abnormality 08/02/2020  . Acute urinary tract infection 10/28/2018  . High risk medication use 10/21/2016  . Urinary frequency 04/22/2016  . Multiple sclerosis ([place]) 02/01/2015  . Gait disturbance 02/01/2015  . Left foot drop 02/01/2015  . Other fatigue 02/01/2015  . DS (disseminated sclerosis) ([place]) 01/06/2013  . Avitaminosis D 01/06/2013    Conditions to be addressed/monitored: Multiple Sclerosis and Vitamin B12 Deficiency; ADL IADL limitations  Care Plan : Social Work Care Plan  Updates made by Daneen Schick since 01/11/2021 12:00 AM    Problem: Quality of Life (General Plan of Care)     Long-Range Goal: Quality of Life Maintained  Start Date: 11/08/2020  Expected End Date: 05/17/2021  Recent Progress: On track  Priority: Low  Note:   Current Barriers:  . Chronic disease management support and education needs related to  Multiple Sclerosis and Vitamin B12 Deficiency   . ADL IADL  limitations  Social Worker Clinical Goal(s):  [name] Over the next 120 days, patient will work with SW to identify and address any acute and/or chronic care coordination needs related to the self health management of  Multiple Sclerosis and Vitamin B12 Deficiency    CCM SW Interventions:  . Inter-disciplinary care team collaboration (see longitudinal plan of care) . Collaboration with [name], FNP regarding development and update of comprehensive plan of care as evidenced by provider attestation and co-signature . Successful outbound call placed to the patient to assess for care coordination needs . Discussed the patient continues to do well in the home with not acute resource needs . Discussed long term follow up with SW while patient remains actively involved with  RN Case Manager  to address care management needs . Scheduled follow up call over the next 90 days Patient Goals/Self-Care Activities . Over the next 90 days, patient will:   - Patient will self administer medications as prescribed Patient will attend all scheduled provider appointments Patient will call provider office for new concerns or questions Contact SW as needed prior to next scheduled call Follow Up Plan:  SW will follow up with the patient over the next 90 days       Follow Up Plan: SW will follow up with patient by phone over the next 90 days      Daneen Schick, BSW, CDP Social Worker, Certified Dementia Practitioner [place] [facility] [place] [facility] [phone]

## 2021-02-18 ENCOUNTER — Telehealth: Payer: Self-pay | Admitting: Neurology

## 2021-02-18 DIAGNOSIS — M21372 Foot drop, left foot: Secondary | ICD-10-CM

## 2021-02-18 DIAGNOSIS — R269 Unspecified abnormalities of gait and mobility: Secondary | ICD-10-CM

## 2021-02-18 DIAGNOSIS — G35 Multiple sclerosis: Secondary | ICD-10-CM

## 2021-02-18 NOTE — Telephone Encounter (Signed)
Pt would like a call to discuss her request for a referral to [facility] [place] [facility] for Physical Therapy.  Their # is [phone]

## 2021-02-18 NOTE — Addendum Note (Signed)
Addended by: Wyvonnia Lora on: 02/18/2021 04:51 PM   Modules accepted: Orders

## 2021-02-18 NOTE — Telephone Encounter (Signed)
Called pt back. She would like referral for PT to [facility]. Wants to work on walking. I placed referral. She will call back if she does not hear to get appt set up in the next 1-2 weeks.

## 2021-03-13 ENCOUNTER — Ambulatory Visit: Payer: BC Managed Care – PPO

## 2021-03-13 DIAGNOSIS — G35 Multiple sclerosis: Secondary | ICD-10-CM

## 2021-03-13 NOTE — Chronic Care Management (AMB) (Signed)
Social Work Note  03/13/2021 Name: [name] MRN: [id number] DOB: [birth year]  [name] is a 55 y.o. year old female who is a primary care patient of [name], [name].  The Care Management team was consulted for assistance with chronic disease management and care coordination needs.  [name] was given information about Care Management services today including:  Care Management services include personalized support from designated clinical staff supervised by her physician, including individualized plan of care and coordination with other care providers 24/7 contact phone numbers for assistance for urgent and routine care needs. The patient may stop care management services at any time (effective at the end of the month) by phone call to the office staff.  Patient agreed to services and consent obtained.   Engaged with patient by telephone for follow up visit in response to provider referral for social work chronic care management and care coordination services.  Assessment: Review of patient past medical history, allergies, medications, and health status, including review of pertinent consultant reports was performed as part of comprehensive evaluation and provision of care management/care coordination services.   SDOH (Social Determinants of Health) assessments and interventions performed:  No    Advanced Directives Status: Not addressed in this encounter.  Care Plan  Allergies  Allergen Reactions   Food Other (See Comments)    WHAT FOOD CAUSES THIS REACTION? Eyes swelling and throat swelling   Oxycodone Hcl Hives   Soy Allergy     Other reaction(s): SWELLING    Outpatient Encounter Medications as of 03/13/2021  Medication Sig Note   B-12 MICROLOZENGE 500 MCG SUBL TAKE 2 TABLETS BY MOUTH EVERY DAY    Cholecalciferol (VITAMIN D3) 50 MCG (2000 UT) TABS TAKE 1 TABLET BY MOUTH EVERY DAY OR AS DIRECTED    CVS B12 QUICK DISSOLVE 500 MCG LOZG TAKE 2 TABS BY MOUTH  DAILY    Dimethyl Fumarate 240 MG CPDR Take 240 mg by mouth 2 (two) times daily. 11/06/2020: CaseId:[id number];Status:Approved;Review Type:Prior Auth;Coverage Start Date:10/07/2020;Coverage End Date:11/06/2021;   furosemide (LASIX) 40 MG tablet Take 1 tablet (40 mg total) by mouth daily. (Patient not taking: Reported on 09/24/2020)    hydrochlorothiazide (HYDRODIURIL) 12.5 MG tablet TAKE 1 TABLET BY MOUTH EVERY DAY    Probiotic Product (PROBIOTIC PO) Take 1 Dose by mouth daily.    TURMERIC-GINGER PO Take 2 each by mouth daily. Gummies    No facility-administered encounter medications on file as of 03/13/2021.    Patient Active Problem List   Diagnosis Date Noted   Edema 08/29/2020   Lymphedema 08/02/2020   Stress 08/02/2020   Gait abnormality 08/02/2020   Acute urinary tract infection 10/28/2018   High risk medication use 10/21/2016   Urinary frequency 04/22/2016   Multiple sclerosis ([place]) 02/01/2015   Gait disturbance 02/01/2015   Left foot drop 02/01/2015   Other fatigue 02/01/2015   DS (disseminated sclerosis) ([place]) 01/06/2013   Avitaminosis D 01/06/2013    Conditions to be addressed/monitored:  Multiple Sclerosis and Vitamin B12 Deficiency ;  Home Modification Needs  Care Plan : Social Work Care Plan  Updates made by [name] since 03/13/2021 12:00 AM     Problem: Quality of Life (General Plan of Care)      Long-Range Goal: Quality of Life Maintained   Start Date: 11/08/2020  Expected End Date: 05/17/2021  Recent Progress: On track  Priority: Low  Note:   Current Barriers:  Chronic disease management support and education needs related to  Multiple Sclerosis and Vitamin B12 Deficiency   ADL IADL limitations  Social Worker Clinical Goal(s):  patient will work with SW to identify and address any acute and/or chronic care coordination needs related to the self health management of  Multiple Sclerosis and Vitamin B12 Deficiency    CCM SW Interventions:   Inter-disciplinary care team collaboration (see longitudinal plan of care) Collaboration with Christina Brine, FNP regarding development and update of comprehensive plan of care as evidenced by provider attestation and co-signature Inbound call received from patient requesting assistance with resource needs Discussed the patient had a recent fall in the shower when her husband was trying to assist her with getting out of the tub - patient states as he moved her legs to the side she slipped off the shower chair and had to call EMS to assist with getting up Patient would like to revisit resources to assist with a walk-in shower Discussed past resources were unable to install a walk in shower in the patients downstairs due to the patient only having a half bath and inability to install plumbing Determined the patient would like to look for a resource to assist with a bathroom modification in the upstairs of the home to change a bath tub to a walk in shower Provided the patient with contact number to [name] to determine if she is eligible for the program Patient also reports need for a replacement slider on her walker - patient reports the slider she uses has a ball and spring which help prevent sliding. She has been unable to find replacements and does not want to purchase a new walker for the slide patient has visited [name] and they did not have the appropriate slides for the patients walker Advised the patient SW could collaborate with RN Care Manager to discuss other options for the patient Collaboration with RN Care Manager to review patient need for walker replacement parts - Discussed opportunity to visit [name] or small locally owned pharmacies that may be able to assist Successful outbound call placed to the patient to provide contact number for [name] Discussed the patient did contact [name] and was informed  they only assist with heating  Advised the patient they have assisted with home modifications in the past SW contacted [name] with [name] by e-mail ( [email or web address]) requesting feedback regarding services offered  SW will follow up over the next 14 days  Patient Goals/Self-Care Activities patient will:   -  [place] to discuss walker replacement part needs -Contact SW as needed prior to next scheduled call  Follow Up Plan:  SW will follow up with the patient over the next 14 days       Follow Up Plan: SW will follow up with patient by phone over the next 14 days      [name], BSW, CDP Social Worker, Certified Dementia Practitioner [place] [facility] [place] [facility] [phone]

## 2021-03-13 NOTE — Patient Instructions (Signed)
Social Worker Visit Information  Goals we discussed today:   Goals Addressed             This Visit's Progress    Quality of life maintained       Timeframe:  Long-Range Goal Priority:  Low Start Date:  2.24.22                           Expected End Date:   9.2.22                    Next planned outreach date: 7.11.22  Patient Goals/Self-Care Activities patient will:   - [place] to discuss walker replacement part needs -Contact SW as needed prior to next scheduled call          Materials Provided: Verbal education about community resources provided by phone  Follow Up Plan: SW will follow up with patient by phone over the next 14 days   Daneen Schick, BSW, CDP Social Worker, Certified Dementia Practitioner [place] [facility] [place] [facility] [phone]

## 2021-03-25 ENCOUNTER — Ambulatory Visit: Payer: Medicare Other | Attending: Critical Care Medicine

## 2021-03-25 ENCOUNTER — Ambulatory Visit: Payer: BC Managed Care – PPO

## 2021-03-25 DIAGNOSIS — G35 Multiple sclerosis: Secondary | ICD-10-CM

## 2021-03-25 DIAGNOSIS — Z23 Encounter for immunization: Secondary | ICD-10-CM

## 2021-03-25 NOTE — Chronic Care Management (AMB) (Addendum)
Social Work Note  03/25/2021 Name: [name] MRN: [id number] DOB: 03/27/1966  [name] is a 55 y.o. year old female who is a primary care patient of [name], [place].  The Care Management team was consulted for assistance with chronic disease management and care coordination needs.  Ms. Fettes was given information about Care Management services today including:  Care Management services include personalized support from designated clinical staff supervised by her physician, including individualized plan of care and coordination with other care providers 24/7 contact phone numbers for assistance for urgent and routine care needs. The patient may stop care management services at any time (effective at the end of the month) by phone call to the office staff.  Patient agreed to services and consent obtained.   Collaboration with [employer]  for  case discussion  in response to provider referral for social work chronic care management and care coordination services.  Assessment: Review of patient past medical history, allergies, medications, and health status, including review of pertinent consultant reports was performed as part of comprehensive evaluation and provision of care management/care coordination services.   SDOH (Social Determinants of Health) assessments and interventions performed:  No    Advanced Directives Status: Not addressed in this encounter.  Care Plan  Allergies  Allergen Reactions   Food Other (See Comments)    WHAT FOOD CAUSES THIS REACTION? Eyes swelling and throat swelling   Oxycodone Hcl Hives   Soy Allergy     Other reaction(s): SWELLING    Outpatient Encounter Medications as of 03/25/2021  Medication Sig Note   B-12 MICROLOZENGE 500 MCG SUBL TAKE 2 TABLETS BY MOUTH EVERY DAY    Cholecalciferol (VITAMIN D3) 50 MCG (2000 UT) TABS TAKE 1 TABLET BY MOUTH EVERY DAY OR AS DIRECTED    [facility] B12 QUICK DISSOLVE 500 MCG LOZG  TAKE 2 TABS BY MOUTH DAILY    Dimethyl Fumarate 240 MG CPDR Take 240 mg by mouth 2 (two) times daily. 11/06/2020: CaseId:[id number];Status:Approved;Review Type:Prior Auth;Coverage Start Date:10/07/2020;Coverage End Date:11/06/2021;   furosemide (LASIX) 40 MG tablet Take 1 tablet (40 mg total) by mouth daily. (Patient not taking: Reported on 09/24/2020)    hydrochlorothiazide (HYDRODIURIL) 12.5 MG tablet TAKE 1 TABLET BY MOUTH EVERY DAY    Probiotic Product (PROBIOTIC PO) Take 1 Dose by mouth daily.    TURMERIC-GINGER PO Take 2 each by mouth daily. Gummies    No facility-administered encounter medications on file as of 03/25/2021.    Patient Active Problem List   Diagnosis Date Noted   Edema 08/29/2020   Lymphedema 08/02/2020   Stress 08/02/2020   Gait abnormality 08/02/2020   Acute urinary tract infection 10/28/2018   High risk medication use 10/21/2016   Urinary frequency 04/22/2016   Multiple sclerosis ([place]) 02/01/2015   Gait disturbance 02/01/2015   Left foot drop 02/01/2015   Other fatigue 02/01/2015   DS (disseminated sclerosis) ([place]) 01/06/2013   Avitaminosis D 01/06/2013    Conditions to be addressed/monitored:  MS and B12 Deficiency ; Financial constraints related to home modifications  Patient Care Plan: Social Work Care Plan     Problem Identified: Quality of Life (General Plan of Care)      Long-Range Goal: Quality of Life Maintained   Start Date: 11/08/2020  Expected End Date: 05/17/2021  Recent Progress: On track  Priority: Low  Note:   Current Barriers:  Chronic disease management support and education needs related to  Multiple Sclerosis and Vitamin B12  Deficiency   ADL IADL limitations  Social Worker Clinical Goal(s):  patient will work with SW to identify and address any acute and/or chronic care coordination needs related to the self health management of  Multiple Sclerosis and Vitamin B12 Deficiency    CCM SW Interventions:  Inter-disciplinary care team  collaboration (see longitudinal plan of care) Collaboration with [name], [place] regarding development and update of comprehensive plan of care as evidenced by provider attestation and co-signature Successful outbound call placed to [employer] to discuss patient resource needs to renovate her bathroom Determined [facility] can not renovate homes in [place] due to the [place] having a housing rehabilitation program Noted [employer] program is not accepting applications at this time Determined via [place] there is a Estate manager/land agent of up to $15,000 for home owners: Scientist, research (physical sciences) of up to $15,000 are available to [place] and owners of rental property. [employer] is a referral, informational, and educational organization dedicated to providing information about many housing issues. For more information, call [phone]. [employer] is a Advertising account planner. For more information, call [phone] Unsuccessful outbound call placed to the patient to share information, voice message left requesting a return call Inbound call received from the patient Advised patient of above resources- patient requests SW email her information so she can follow up Discussed patient has been in contact with MS society in the past week to obtain assistance with fixing air conditioning - patient reports her air is repaired and she plans to contact the foundation again regarding bathroom remodel needs  Patient Goals/Self-Care Activities patient will:   -  [place] to discuss walker replacement part needs -Contact SW as needed prior to next scheduled call -Follow up with home modification resources  Follow Up Plan:  SW will follow up with the patient over the next 21 days          Follow Up Plan: SW will follow up with patient by phone over the next  21 days      Daneen Schick, BSW, CDP Social Worker, Certified Dementia Practitioner [place] [facility] [place] [facility] [phone]

## 2021-03-25 NOTE — Progress Notes (Signed)
   Covid-19 Vaccination Clinic  Name:  [name]    MRN: [id number] DOB: Jul 04, 1966  03/25/2021  [name] was observed post Covid-19 immunization for 15 minutes without incident. She was provided with Vaccine Information Sheet and instruction to access the V-Safe system.   [name] was instructed to call 911 with any severe reactions post vaccine: Difficulty breathing  Swelling of face and throat  A fast heartbeat  A bad rash all over body  Dizziness and weakness   Immunizations Administered     Name Date Dose VIS Date Route   PFIZER Comrnaty(Gray TOP) Covid-19 Vaccine 03/25/2021  2:36 PM 0.3 mL 08/23/2020 Intramuscular   Manufacturer: [employer]   Lot: [id number]   [place]: [phone]

## 2021-03-25 NOTE — Patient Instructions (Addendum)
Visit Information   Goals Addressed             This Visit's Progress    Quality of life maintained       Timeframe:  Long-Range Goal Priority:  Low Start Date:  2.24.22                           Expected End Date:   9.2.22                    Next planned outreach date: 7.29.22  Patient Goals/Self-Care Activities patient will:   - Therapist, occupational Medical to discuss walker replacement part needs -Contact SW as needed prior to next scheduled call -Follow up with home modification resources         Patient verbalizes understanding of instructions provided today and agrees to view in [place].     Daneen Schick, BSW, CDP Social Worker, Certified Dementia Practitioner [place] / [place] [facility] [phone]

## 2021-03-26 ENCOUNTER — Other Ambulatory Visit: Payer: Medicare Other

## 2021-03-26 ENCOUNTER — Ambulatory Visit: Payer: Self-pay

## 2021-03-27 ENCOUNTER — Telehealth: Payer: Self-pay

## 2021-03-27 ENCOUNTER — Other Ambulatory Visit: Payer: Self-pay | Admitting: Nurse Practitioner

## 2021-03-27 NOTE — Telephone Encounter (Signed)
I called pt to schedule an appt for her physical I left her a vm to call the office [facility]

## 2021-04-09 ENCOUNTER — Other Ambulatory Visit: Payer: Self-pay | Admitting: Neurology

## 2021-04-12 ENCOUNTER — Ambulatory Visit: Payer: BC Managed Care – PPO

## 2021-04-12 DIAGNOSIS — R269 Unspecified abnormalities of gait and mobility: Secondary | ICD-10-CM

## 2021-04-12 DIAGNOSIS — G35 Multiple sclerosis: Secondary | ICD-10-CM

## 2021-04-12 NOTE — Chronic Care Management (AMB) (Signed)
Social Work Note  04/12/2021 Name: [name] MRN: [id number] DOB: 05/23/1966  [name] is a 55 y.o. year old female who is a primary care patient of [name], [place].  The Care Management team was consulted for assistance with chronic disease management and care coordination needs.  Ms. Creppel was given information about Care Management services today including:  Care Management services include personalized support from designated clinical staff supervised by her physician, including individualized plan of care and coordination with other care providers 24/7 contact phone numbers for assistance for urgent and routine care needs. The patient may stop care management services at any time (effective at the end of the month) by phone call to the office staff.  Patient agreed to services and consent obtained.   Engaged with patient by telephone for follow up visit in response to provider referral for social work chronic care management and care coordination services.  Assessment: Review of patient past medical history, allergies, medications, and health status, including review of pertinent consultant reports was performed as part of comprehensive evaluation and provision of care management/care coordination services.   SDOH (Social Determinants of Health) assessments and interventions performed:  No    Advanced Directives Status: Not addressed in this encounter.  Care Plan  Allergies  Allergen Reactions   Food Other (See Comments)    WHAT FOOD CAUSES THIS REACTION? Eyes swelling and throat swelling   Oxycodone Hcl Hives   Soy Allergy     Other reaction(s): SWELLING    Outpatient Encounter Medications as of 04/12/2021  Medication Sig Note   B-12 MICROLOZENGE 500 MCG SUBL TAKE 2 TABLETS BY MOUTH EVERY DAY    Cholecalciferol (VITAMIN D3) 50 MCG (2000 UT) TABS TAKE 1 TABLET BY MOUTH EVERY DAY OR AS DIRECTED    CVS B12 QUICK DISSOLVE 500 MCG LOZG TAKE 2 TABS BY MOUTH  DAILY    Dimethyl Fumarate 240 MG CPDR Take 240 mg by mouth 2 (two) times daily. 11/06/2020: CaseId:[id number];Status:Approved;Review Type:Prior Auth;Coverage Start Date:10/07/2020;Coverage End Date:11/06/2021;   furosemide (LASIX) 40 MG tablet Take 1 tablet (40 mg total) by mouth daily. (Patient not taking: Reported on 09/24/2020)    hydrochlorothiazide (HYDRODIURIL) 12.5 MG tablet TAKE 1 TABLET BY MOUTH EVERY DAY    Probiotic Product (PROBIOTIC PO) Take 1 Dose by mouth daily.    TURMERIC-GINGER PO Take 2 each by mouth daily. Gummies    No facility-administered encounter medications on file as of 04/12/2021.    Patient Active Problem List   Diagnosis Date Noted   Edema 08/29/2020   Lymphedema 08/02/2020   Stress 08/02/2020   Gait abnormality 08/02/2020   Acute urinary tract infection 10/28/2018   High risk medication use 10/21/2016   Urinary frequency 04/22/2016   Multiple sclerosis ([place]) 02/01/2015   Gait disturbance 02/01/2015   Left foot drop 02/01/2015   Other fatigue 02/01/2015   DS (disseminated sclerosis) ([place]) 01/06/2013   Avitaminosis D 01/06/2013    Conditions to be addressed/monitored:  MS and Vitamin B12 Deficiency ;  Home Modifications  Care Plan : Social Work Care Plan  Updates made by Daneen Schick since 04/12/2021 12:00 AM     Problem: Quality of Life (General Plan of Care)      Long-Range Goal: Quality of Life Maintained   Start Date: 11/08/2020  Expected End Date: 05/17/2021  Recent Progress: On track  Priority: Low  Note:   Current Barriers:  Chronic disease management support and education needs related to  Multiple  Sclerosis and Vitamin B12 Deficiency   ADL IADL limitations  Social Worker Clinical Goal(s):  patient will work with SW to identify and address any acute and/or chronic care coordination needs related to the self health management of  Multiple Sclerosis and Vitamin B12 Deficiency    CCM SW Interventions:  Inter-disciplinary care team  collaboration (see longitudinal plan of care) Collaboration with [name], FNP regarding development and update of comprehensive plan of care as evidenced by provider attestation and co-signature Successful outbound call placed to the patient to assess goal progression Determined the patient has not followed up with the MS society regarding funding for bathroom remodel Patient reports she thinks she has a UTI and has not taken the time to contact the resource Discussed the patient has been attempting to treat her UTI with over the counter medication and has yet to be seen by a medical provider Encouraged the patient to contact her providers office if symptoms persist Patient reports due to high heat she does not want to have to leave her home to go to the providers office Advised the patient to contact her provider to see if a urine sample can be dropped off and/or she may participate in a virtual visit to address symptoms - patient stated understanding Discussed plans for SW to follow up with the patient over the next 90 days encouraged the patient to contact SW sooner as needed   Patient Goals/Self-Care Activities patient will:   -  Contact primary provider as needed to address symptoms of UTI -Contact SW as needed prior to next scheduled call -Follow up with home modification resources  Follow Up Plan:  SW will follow up with the patient over the next 90 days       Follow Up Plan: SW will follow up with patient by phone over the next 90 days      Daneen Schick, BSW, CDP Social Worker, Certified Dementia Practitioner [place] / [place] [facility] [phone]

## 2021-04-12 NOTE — Patient Instructions (Signed)
Visit Information   Goals Addressed             This Visit's Progress    Quality of life maintained       Timeframe:  Long-Range Goal Priority:  Low Start Date:  2.24.22                           Expected End Date:   9.2.22                    Next planned outreach date: 10.19.22  Patient Goals/Self-Care Activities patient will:   - Contact primary provider as needed to address symptoms of UTI -Contact SW as needed prior to next scheduled call -Follow up with home modification resources        Patient verbalizes understanding of instructions provided today and agrees to view in [place].   The care management team will reach out to the patient again over the next 90 days.   Daneen Schick, BSW, CDP Social Worker, Certified Dementia Practitioner [place] / [place] [facility] [phone]

## 2021-04-17 ENCOUNTER — Other Ambulatory Visit: Payer: Self-pay | Admitting: Neurology

## 2021-04-17 ENCOUNTER — Telehealth: Payer: Self-pay | Admitting: Neurology

## 2021-04-17 MED ORDER — SULFAMETHOXAZOLE-TRIMETHOPRIM 800-160 MG PO TABS: 1.0000 | ORAL_TABLET | Freq: Two times a day (BID) | ORAL | 0 refills | Status: DC

## 2021-04-17 NOTE — Telephone Encounter (Signed)
Called pt back. Advised Dr. Felecia Shelling approved her taking this. She verbalized understanding. She reports UTIx3 weeks. has tried OTC AZO ineffective. OB cannot get her in this week. Per Dr. Felecia Shelling, he will call in Bactrim for her to take twice daily (morning and evening) for 1 week. [facility]. Advised her to call back if sx do not improve. We will then bring her in for UA/culture. She verbalized understanding and appreciation.

## 2021-04-17 NOTE — Telephone Encounter (Signed)
Pt called wanting to know if its okay that she takes a florifly probiotic and emma digestive immune supplement along with her medications. Pt is requesting a call back.

## 2021-04-25 ENCOUNTER — Ambulatory Visit: Payer: BC Managed Care – PPO | Admitting: Neurology

## 2021-04-29 ENCOUNTER — Telehealth: Payer: BC Managed Care – PPO

## 2021-04-30 ENCOUNTER — Ambulatory Visit: Payer: BC Managed Care – PPO | Admitting: Nurse Practitioner

## 2021-05-01 ENCOUNTER — Other Ambulatory Visit: Payer: Self-pay

## 2021-05-01 ENCOUNTER — Ambulatory Visit (INDEPENDENT_AMBULATORY_CARE_PROVIDER_SITE_OTHER): Payer: BC Managed Care – PPO | Admitting: Nurse Practitioner

## 2021-05-01 ENCOUNTER — Encounter: Payer: Self-pay | Admitting: Nurse Practitioner

## 2021-05-01 VITALS — BP 132/80 | HR 108 | Temp 98.5°F

## 2021-05-01 DIAGNOSIS — L0232 Furuncle of buttock: Secondary | ICD-10-CM

## 2021-05-01 DIAGNOSIS — B372 Candidiasis of skin and nail: Secondary | ICD-10-CM | POA: Diagnosis not present

## 2021-05-01 DIAGNOSIS — R7303 Prediabetes: Secondary | ICD-10-CM | POA: Diagnosis not present

## 2021-05-01 DIAGNOSIS — R82998 Other abnormal findings in urine: Secondary | ICD-10-CM

## 2021-05-01 LAB — POCT URINALYSIS DIPSTICK
Bilirubin, UA: NEGATIVE
Glucose, UA: POSITIVE — AB
Ketones, UA: POSITIVE
Nitrite, UA: NEGATIVE
Protein, UA: NEGATIVE
Spec Grav, UA: 1.01 (ref 1.010–1.025)
Urobilinogen, UA: 0.2 E.U./dL
pH, UA: 5.5 (ref 5.0–8.0)

## 2021-05-01 MED ORDER — NYSTATIN 100000 UNIT/GM EX POWD: 1.0000 "application " | Freq: Three times a day (TID) | CUTANEOUS | 1 refills | Status: DC

## 2021-05-01 MED ORDER — DOXYCYCLINE HYCLATE 100 MG PO CAPS: 100.0000 mg | ORAL_CAPSULE | Freq: Two times a day (BID) | ORAL | 0 refills | Status: DC

## 2021-05-01 MED ORDER — KETOCONAZOLE 2 % EX CREA: 1.0000 "application " | TOPICAL_CREAM | Freq: Every day | CUTANEOUS | 1 refills | Status: DC

## 2021-05-01 NOTE — Progress Notes (Addendum)
I,Yamilka Roman [employer] as a Education administrator for [employer], NP.,have documented all relevant documentation on the behalf of [employer], NP,as directed by  [name], NP while in the presence of [name], NP.  This visit occurred during the SARS-CoV-2 public health emergency.  Safety protocols were in place, including screening questions prior to the visit, additional usage of staff PPE, and extensive cleaning of exam room while observing appropriate contact time as indicated for disinfecting solutions.  Subjective:     Patient ID: [name] , female    DOB: [birth year] , 55 y.o.   MRN: [id number]   Chief Complaint  Patient presents with   Urinary Frequency   Recurrent Skin Infections    HPI  Patient presents today for burning during urination and a boil. She was treated for a UTI by her neurologist with bactrim. She finished the dose but still thinks she has a UTI. No frequency sometimes burning. She also has a boil in her back and buttocks area. She has been using boil ez cream.   Urinary Frequency  Associated symptoms include frequency. Pertinent negatives include no chills.    Past Medical History:  Diagnosis Date   Movement disorder    Multiple sclerosis ([place])      Family History  Problem Relation Age of Onset   Hypertension Mother    Hypertension Father    Diabetes Father      Current Outpatient Medications:    Cholecalciferol (VITAMIN D3) 50 MCG (2000 UT) TABS, TAKE 1 TABLET BY MOUTH EVERY DAY OR AS DIRECTED, Disp: 90 tablet, Rfl: 3   CVS B12 QUICK DISSOLVE 500 MCG LOZG, TAKE 2 TABS BY MOUTH DAILY, Disp: 180 lozenge, Rfl: 1   Dimethyl Fumarate 240 MG CPDR, Take 240 mg by mouth 2 (two) times daily., Disp: , Rfl:    doxycycline (VIBRAMYCIN) 100 MG capsule, Take 1 capsule (100 mg total) by mouth 2 (two) times daily., Disp: 14 capsule, Rfl: 0   ketoconazole (NIZORAL) 2 % cream, Apply 1 application topically daily., Disp: 15 g, Rfl: 1    nystatin powder, Apply 1 application topically 3 (three) times daily., Disp: 15 g, Rfl: 1   Probiotic Product (PROBIOTIC PO), Take 1 Dose by mouth daily., Disp: , Rfl:    TURMERIC-GINGER PO, Take 2 each by mouth daily. Gummies, Disp: , Rfl:    UNABLE TO FIND, Med Name: Terrence Dupont take 2 tablets daily, Disp: , Rfl:    Allergies  Allergen Reactions   Food Other (See Comments)    WHAT FOOD CAUSES THIS REACTION? Eyes swelling and throat swelling   Oxycodone Hcl Hives   Soy Allergy     Other reaction(s): SWELLING     Review of Systems  Constitutional:  Negative for chills and fever.  HENT:  Negative for congestion and sinus pain.   Respiratory:  Negative for cough, shortness of breath and wheezing.   Cardiovascular:  Negative for chest pain and palpitations.  Endocrine: Negative for polydipsia, polyphagia and polyuria.  Genitourinary:  Positive for frequency.  Musculoskeletal:  Negative for arthralgias and myalgias.  Neurological:  Negative for dizziness, numbness and headaches.    Today's Vitals   05/01/21 1456  BP: 132/80  Pulse: (!) 108  Temp: 98.5 F (36.9 C)  PainSc: 5    There is no height or weight on file to calculate BMI.   Objective:  Physical Exam Constitutional:      Appearance: Normal appearance.  HENT:     Head: Normocephalic  and atraumatic.  Cardiovascular:     Rate and Rhythm: Normal rate and regular rhythm.     Pulses: Normal pulses.     Heart sounds: Normal heart sounds. No murmur heard. Pulmonary:     Effort: Pulmonary effort is normal. No respiratory distress.     Breath sounds: Normal breath sounds. No wheezing.  Skin:    General: Skin is warm and dry.     Capillary Refill: Capillary refill takes less than 2 seconds.     Findings: Rash present.     Comments: Rash on her buttocks and groin area.   Neurological:     Mental Status: She is alert and oriented to person, place, and time.        Assessment And Plan:     1. Leukocytes in urine - POCT  Urinalysis Dipstick (81002) - Urine Culture  2. Prediabetes - Hemoglobin A1c -Instructed patient to check BS atleast twice daily.    3. Boil of buttock - doxycycline (VIBRAMYCIN) 100 MG capsule; Take 1 capsule (100 mg total) by mouth 2 (two) times daily.  Dispense: 14 capsule; Refill: 0 - CMP14+EGFR - CBC with Diff  4. Skin yeast infection - nystatin powder; Apply 1 application topically 3 (three) times daily.  Dispense: 15 g; Refill: 1 - ketoconazole (NIZORAL) 2 % cream; Apply 1 application topically daily.  Dispense: 15 g; Refill: 1    Patient was given opportunity to ask questions. Patient verbalized understanding of the plan and was able to repeat key elements of the plan. All questions were answered to their satisfaction.  [name], NP   I, [name], NP, have reviewed all documentation for this visit. The documentation on 05/01/21 for the exam, diagnosis, procedures, and orders are all accurate and complete.   IF YOU HAVE BEEN REFERRED TO A SPECIALIST, IT MAY TAKE 1-2 WEEKS TO SCHEDULE/PROCESS THE REFERRAL. IF YOU HAVE NOT HEARD FROM US/SPECIALIST IN TWO WEEKS, PLEASE GIVE [place] A CALL AT [phone].   THE PATIENT IS ENCOURAGED TO PRACTICE SOCIAL DISTANCING DUE TO THE COVID-19 PANDEMIC.

## 2021-05-02 LAB — CBC WITH DIFFERENTIAL/PLATELET
Basophils Absolute: 0 10*3/uL (ref 0.0–0.2)
Basos: 0 %
EOS (ABSOLUTE): 0.1 10*3/uL (ref 0.0–0.4)
Eos: 2 %
Hematocrit: 41.5 % (ref 34.0–46.6)
Hemoglobin: 14 g/dL (ref 11.1–15.9)
Immature Grans (Abs): 0 10*3/uL (ref 0.0–0.1)
Immature Granulocytes: 0 %
Lymphocytes Absolute: 1.4 10*3/uL (ref 0.7–3.1)
Lymphs: 17 %
MCH: 30.5 pg (ref 26.6–33.0)
MCHC: 33.7 g/dL (ref 31.5–35.7)
MCV: 90 fL (ref 79–97)
Monocytes Absolute: 0.7 10*3/uL (ref 0.1–0.9)
Monocytes: 9 %
Neutrophils Absolute: 5.7 10*3/uL (ref 1.4–7.0)
Neutrophils: 72 %
Platelets: 291 10*3/uL (ref 150–450)
RBC: 4.59 x10E6/uL (ref 3.77–5.28)
RDW: 13.2 % (ref 11.7–15.4)
WBC: 8 10*3/uL (ref 3.4–10.8)

## 2021-05-02 LAB — CMP14+EGFR
ALT: 27 IU/L (ref 0–32)
AST: 13 IU/L (ref 0–40)
Albumin/Globulin Ratio: 1.6 (ref 1.2–2.2)
Albumin: 4.4 g/dL (ref 3.8–4.9)
Alkaline Phosphatase: 80 IU/L (ref 44–121)
BUN/Creatinine Ratio: 15 (ref 9–23)
BUN: 11 mg/dL (ref 6–24)
Bilirubin Total: 0.3 mg/dL (ref 0.0–1.2)
CO2: 21 mmol/L (ref 20–29)
Calcium: 9.7 mg/dL (ref 8.7–10.2)
Chloride: 99 mmol/L (ref 96–106)
Creatinine, Ser: 0.71 mg/dL (ref 0.57–1.00)
Globulin, Total: 2.8 g/dL (ref 1.5–4.5)
Glucose: 443 mg/dL — ABNORMAL HIGH (ref 65–99)
Potassium: 4.4 mmol/L (ref 3.5–5.2)
Sodium: 137 mmol/L (ref 134–144)
Total Protein: 7.2 g/dL (ref 6.0–8.5)
eGFR: 100 mL/min/{1.73_m2} (ref >59)

## 2021-05-02 LAB — HEMOGLOBIN A1C
Est. average glucose Bld gHb Est-mCnc: 292 mg/dL
Hgb A1c MFr Bld: 11.8 % — ABNORMAL HIGH (ref 4.8–5.6)

## 2021-05-03 LAB — URINE CULTURE

## 2021-05-07 ENCOUNTER — Other Ambulatory Visit: Payer: Self-pay | Admitting: Nurse Practitioner

## 2021-05-07 DIAGNOSIS — N181 Chronic kidney disease, stage 1: Secondary | ICD-10-CM

## 2021-05-07 DIAGNOSIS — E0822 Diabetes mellitus due to underlying condition with diabetic chronic kidney disease: Secondary | ICD-10-CM

## 2021-05-07 MED ORDER — METFORMIN HCL 500 MG PO TABS: 500.0000 mg | ORAL_TABLET | Freq: Two times a day (BID) | ORAL | 2 refills | Status: DC

## 2021-05-15 ENCOUNTER — Ambulatory Visit (INDEPENDENT_AMBULATORY_CARE_PROVIDER_SITE_OTHER): Payer: BC Managed Care – PPO

## 2021-05-15 ENCOUNTER — Telehealth: Payer: BC Managed Care – PPO

## 2021-05-15 DIAGNOSIS — N181 Chronic kidney disease, stage 1: Secondary | ICD-10-CM | POA: Diagnosis not present

## 2021-05-15 DIAGNOSIS — E0822 Diabetes mellitus due to underlying condition with diabetic chronic kidney disease: Secondary | ICD-10-CM

## 2021-05-15 DIAGNOSIS — G35 Multiple sclerosis: Secondary | ICD-10-CM

## 2021-05-16 ENCOUNTER — Telehealth: Payer: Self-pay | Admitting: *Deleted

## 2021-05-16 NOTE — Chronic Care Management (AMB) (Signed)
  Chronic Care Management   Note  05/16/2021 Name: [name] MRN: [id number] DOB: 04-Jun-1966  [name] is a 55 y.o. year old female who is a primary care patient of [name], [place]. [name] is currently enrolled in care management services. An additional referral for Pharmacy was placed.   Follow up plan: Telephone appointment with care management team member scheduled for:05/28/21  [place] [facility]  Direct Dial: [phone]

## 2021-05-17 ENCOUNTER — Telehealth: Payer: Self-pay

## 2021-05-17 ENCOUNTER — Ambulatory Visit (INDEPENDENT_AMBULATORY_CARE_PROVIDER_SITE_OTHER): Payer: BC Managed Care – PPO

## 2021-05-17 DIAGNOSIS — G35 Multiple sclerosis: Secondary | ICD-10-CM

## 2021-05-17 DIAGNOSIS — E0822 Diabetes mellitus due to underlying condition with diabetic chronic kidney disease: Secondary | ICD-10-CM

## 2021-05-17 DIAGNOSIS — N181 Chronic kidney disease, stage 1: Secondary | ICD-10-CM

## 2021-05-17 NOTE — Chronic Care Management (AMB) (Signed)
Chronic Care Management Pharmacy Assistant   Name: [name]  MRN: [id number] DOB: 08-Mar-1966  Reason for Encounter: Chart Review for CPP visit on 05/21/2021.   Conditions to be addressed/monitored: DMII and Multiple Sclerosis  Recent office visits:  05/17/2021- Daneen Schick (CCM) Social Worker 05/15/2021- Angle Little, RN (CCM) Nurse   05/07/2021- [name], NP (PCP)-  Orders only Encounter- Metformin 500 mg- 1 tablet twice daily with meals added.   05/01/2021- [name], NP (PCP) - Patient presented for a follow up visit on Leukocytes in urine, Prediabetes, Boil of buttock, Skin yeast infection. Doxycycline Hyclate 100 mg- 1 tablet 2 times daily for 7 days; Ketoconazole 2% cream- Apply 1 application topically daily; Nystatin 1000000 unit/gm powder- Apply 1 application topically 3 (three) times daily started. Cyanocobalamin 500 mcg formation changed sublingual tablets to Lozgenes- take 2 daily. Furosemide '40mg'$ , HCTZ 12.5 mg and Sulfamethoxazole-Trimethoprim 800-160 mg discontinued. Return in 3 months (on 08/01/2021), or physical exam  04/12/2021- Daneen Schick (CCM) Social Worker 03/25/2021-Kendra Humble (CCM) Social Worker 03/13/2021- Daneen Schick (CCM) Social Worker 01/11/2021- Daneen Schick (CCM) Social Worker 12/24/2020- Barb Merino, RN (CCM) Nurse   Recent consult visits:  02/18/2021- Juanell Fairly (Neurology)- Telephone Encounter- Ambulatory referral to Physical Therapy placed.   Hospital visits:  None in previous 6 months  Have you seen any other providers since your last visit? No  Any changes in your medications or health? Yes- Patient recently diagnosed with Diabetes Mellitus and prescribed Metformin 500 mg to take 1 tablet twice daily. Patient has not started medication yet, has questions and concerns.   Any side effects from any medications? None per patient.   Do you have an symptoms or problems not managed by your medications? No  Any concerns  about your health right now? Yes- Patient has concerns about recent medication prescribed- Metformin.  Has your provider asked that you check blood pressure, blood sugar, or follow special diet at home? Patient will be checking blood sugars, has not received a meter yet, sending request to [place] for new meter.  Do you get any type of exercise on a regular basis? No due to current MS.  Can you think of a goal you would like to reach for your health? Yes- patient stated she would like to get off the walker and be able to drive again.  Do you have any problems getting your medications? None per patient.  Is there anything that you would like to discuss during the appointment? Medication questions.  Patient aware to have medications and supplements near during telephone appointment with [name], Pharm D. On 05/21/2021 at 2:00 PM.    Medications: Outpatient Encounter Medications as of 05/17/2021  Medication Sig Note   Cholecalciferol (VITAMIN D3) 50 MCG (2000 UT) TABS TAKE 1 TABLET BY MOUTH EVERY DAY OR AS DIRECTED    [facility] B12 QUICK DISSOLVE 500 MCG LOZG TAKE 2 TABS BY MOUTH DAILY    Dimethyl Fumarate 240 MG CPDR Take 240 mg by mouth 2 (two) times daily. 11/06/2020: CaseId:[id number];Status:Approved;Review Type:Prior Auth;Coverage Start Date:10/07/2020;Coverage End Date:11/06/2021;   doxycycline (VIBRAMYCIN) 100 MG capsule Take 1 capsule (100 mg total) by mouth 2 (two) times daily.    ketoconazole (NIZORAL) 2 % cream Apply 1 application topically daily.    metFORMIN (GLUCOPHAGE) 500 MG tablet Take 1 tablet (500 mg total) by mouth 2 (two) times daily with a meal.    nystatin powder Apply 1 application topically 3 (three) times daily.    Probiotic Product (  PROBIOTIC PO) Take 1 Dose by mouth daily.    TURMERIC-GINGER PO Take 2 each by mouth daily. Gummies    UNABLE TO FIND Med Name: Terrence Dupont take 2 tablets daily    No facility-administered encounter medications on file as of 05/17/2021.    Care  Gaps: Pneumococcal Vaccine 19-72 Years old- Overdue - never done (Dose 1 - PCV) URINE MICROALBUMIN (Yearly)-never done   TETANUS/TDAP (Every 10 Years)- never done Zoster Vaccines- Shingrix (1 of 2)- never done PAP SMEAR-Modifier (Every 3 Years)- never done COLONOSCOPY (Pts 45-74yr Insurance coverage will need to be confirmed) (Every 10 Years)- never done MAMMOGRAM (Every 2 Years)-Last completed: Aug 22, 2014 INFLUENZA VACCINE- Last completed: Jun 21, 2019  Annual Wellness Visit scheduled 08/29/2021.  Star Rating Drugs: Metformin 500 mg- Last filled- 04/17/2021 for 90 day supply at [facility].   TPattricia Boss CRoselandPharmacist Assistant [phone]

## 2021-05-17 NOTE — Chronic Care Management (AMB) (Signed)
Chronic Care Management    Social Work Note  05/17/2021 Name: CRISTENE RECKERS MRN: [id number] DOB: 1965/10/27  [name] Silverio is a 55 y.o. year old female who is a primary care patient of [name], [place]. The CCM team was consulted to assist the patient with chronic disease management and/or care coordination needs related to: Transportation Needs  and [employer] .   Engaged with patient by telephone for follow up visit in response to provider referral for social work chronic care management and care coordination services.   Consent to Services:  The patient was given information about Chronic Care Management services, agreed to services, and gave verbal consent prior to initiation of services.  Please see initial visit note for detailed documentation.   Patient agreed to services and consent obtained.   Assessment: Review of patient past medical history, allergies, medications, and health status, including review of relevant consultants reports was performed today as part of a comprehensive evaluation and provision of chronic care management and care coordination services.     SDOH (Social Determinants of Health) assessments and interventions performed:    Advanced Directives Status: Not addressed in this encounter.  CCM Care Plan  Allergies  Allergen Reactions   Food Other (See Comments)    WHAT FOOD CAUSES THIS REACTION? Eyes swelling and throat swelling   Oxycodone Hcl Hives   Soy Allergy     Other reaction(s): SWELLING    Outpatient Encounter Medications as of 05/17/2021  Medication Sig Note   Cholecalciferol (VITAMIN D3) 50 MCG (2000 UT) TABS TAKE 1 TABLET BY MOUTH EVERY DAY OR AS DIRECTED    [facility] B12 QUICK DISSOLVE 500 MCG LOZG TAKE 2 TABS BY MOUTH DAILY    Dimethyl Fumarate 240 MG CPDR Take 240 mg by mouth 2 (two) times daily. 11/06/2020: CaseId:[id number];Status:Approved;Review Type:Prior Auth;Coverage Start Date:10/07/2020;Coverage End Date:11/06/2021;    doxycycline (VIBRAMYCIN) 100 MG capsule Take 1 capsule (100 mg total) by mouth 2 (two) times daily.    ketoconazole (NIZORAL) 2 % cream Apply 1 application topically daily.    metFORMIN (GLUCOPHAGE) 500 MG tablet Take 1 tablet (500 mg total) by mouth 2 (two) times daily with a meal.    nystatin powder Apply 1 application topically 3 (three) times daily.    Probiotic Product (PROBIOTIC PO) Take 1 Dose by mouth daily.    TURMERIC-GINGER PO Take 2 each by mouth daily. Gummies    UNABLE TO FIND Med Name: Terrence Dupont take 2 tablets daily    No facility-administered encounter medications on file as of 05/17/2021.    Patient Active Problem List   Diagnosis Date Noted   Edema 08/29/2020   Lymphedema 08/02/2020   Stress 08/02/2020   Gait abnormality 08/02/2020   Acute urinary tract infection 10/28/2018   High risk medication use 10/21/2016   Urinary frequency 04/22/2016   Multiple sclerosis ([place]) 02/01/2015   Gait disturbance 02/01/2015   Left foot drop 02/01/2015   Other fatigue 02/01/2015   DS (disseminated sclerosis) ([place]) 01/06/2013   Avitaminosis D 01/06/2013    Conditions to be addressed/monitored: DMII, CKD Stage I, and MS ; Transportation and Limited access to [place]  Updates made by Daneen Schick since 05/17/2021 12:00 AM     Problem: Quality of Life (General Plan of Care)      Long-Range Goal: Quality of Life Maintained   Start Date: 11/08/2020  Expected End Date: 05/17/2021  Recent Progress: On track  Priority: Low  Note:   Current Barriers:  Chronic disease management support and education needs related to  Multiple Sclerosis and Vitamin B12 Deficiency   ADL IADL limitations  Social Worker Clinical Goal(s):  patient will work with SW to identify and address any acute and/or chronic care coordination needs related to the self health management of  Multiple Sclerosis and Vitamin B12 Deficiency    CCM SW Interventions:  Inter-disciplinary care  team collaboration (see longitudinal plan of care) Collaboration with [name], FNP regarding development and update of comprehensive plan of care as evidenced by provider attestation and co-signature Successful outbound call placed to the patient to assist with care coordination needs Performed chart review to note patient recently seen in clinic for UTI and a rash - patient reports it is still burning when she urinates on her skin Attempted to schedule patient a follow up appointment - patient declined stating due to bed height it is hard for her to get up onto the exam table but she may schedule an office visit with her gynecologist for evaluation Determined the patient has yet to begin taking Diabetic medication - the patient has spoken with RN Care Manager about her concerns and is actively reviewing provided educational materials Encouraged the patient to contact [place] as needed with clinical questions Discussed the patient did receive an in home visit from a Medicare nurse who informed the patient she would research in home blood draw options for the patient due to difficulty going to the clinic Advised the patient home health can perform blood work but SW is unclear if this could be for ongoing routine needs Verbal education provided on the option to have an in home primary care provider - patient does not express interest at this time Determined the patient is interested in receiving mobile meals - advised the patient she does not currently meet criteria due to the program requiring recipients to be age 82 Collaboration with [place] to request feedback on if the patient would be able to pay out of pocket to receive meals Provided written and verbal education to the patient on [employer] and the opportunity to purchase these meals Discussed the patient is interested in transportation resources but does not want to use [employer]  Provided verbal and written  education on how the patient may use her health plan transportation benefit Scheduled follow up call over the next 21 days   Patient Goals/Self-Care Activities patient will:   Management consultant as needed to address symptoms  -Contact SW as needed prior to next scheduled call -Review resource information  Follow Up Plan:  SW will follow up with the patient over the next 21 days       Follow Up Plan: SW will follow up with patient by phone over the next 21 days      Daneen Schick, BSW, CDP Social Worker, Certified Dementia Practitioner [name] / [place] Management [phone]

## 2021-05-17 NOTE — Patient Instructions (Signed)
Social Worker Visit Information  Goals we discussed today:   Goals Addressed             This Visit's Progress    Quality of life maintained   On track    Timeframe:  Long-Range Goal Priority:  Low Start Date:  2.24.22                           Expected End Date:   9.2.22                    Next planned outreach date: 9.21.22  Patient Goals/Self-Care Activities patient will:   Hydrologist as needed to address symptoms  -Contact SW as needed prior to next scheduled call -Review resource information         Materials Provided: Yes: verbal and written resource information  Follow Up Plan: SW will follow up with patient by phone over the next 21 days   Daneen Schick, BSW, CDP Social Worker, Certified Dementia Practitioner [place] / [place] Management [phone]

## 2021-05-21 ENCOUNTER — Other Ambulatory Visit: Payer: Self-pay | Admitting: Nurse Practitioner

## 2021-05-21 ENCOUNTER — Ambulatory Visit: Payer: BC Managed Care – PPO

## 2021-05-21 ENCOUNTER — Other Ambulatory Visit: Payer: Self-pay

## 2021-05-21 DIAGNOSIS — G35 Multiple sclerosis: Secondary | ICD-10-CM

## 2021-05-21 DIAGNOSIS — N181 Chronic kidney disease, stage 1: Secondary | ICD-10-CM

## 2021-05-21 DIAGNOSIS — E0822 Diabetes mellitus due to underlying condition with diabetic chronic kidney disease: Secondary | ICD-10-CM

## 2021-05-21 DIAGNOSIS — E782 Mixed hyperlipidemia: Secondary | ICD-10-CM

## 2021-05-21 MED ORDER — METFORMIN HCL 500 MG PO TABS: 500.0000 mg | ORAL_TABLET | Freq: Two times a day (BID) | ORAL | 2 refills | Status: DC

## 2021-05-21 MED ORDER — MICROLET LANCETS MISC
11 refills | Status: DC
Start: 2021-05-21 — End: 2022-02-07

## 2021-05-21 MED ORDER — FREESTYLE LITE TEST VI STRP: ORAL_STRIP | 1 refills | Status: DC

## 2021-05-21 MED ORDER — FREESTYLE LITE W/DEVICE KIT: 1.0000 | PACK | Freq: Two times a day (BID) | 1 refills | Status: AC

## 2021-05-21 NOTE — Progress Notes (Signed)
Chronic Care Management Pharmacy Note  05/29/2021 Name:  [name] MRN:  [id number] DOB:  05-23-1966  Summary: Patient reports that she is ready to start checking her BS.   Recommendations/Changes made from today's visit: Recommend patient take her medication and take it on a consistent basis.   Plan: Recommend patient continue current medication regimen.    Subjective: [name] is an 55 y.o. year old female who is a primary patient of [name], [place].  The CCM team was consulted for assistance with disease management and care coordination needs.    Engaged with patient by telephone for initial visit in response to provider referral for pharmacy case management and/or care coordination services. Patient reports that she uses a walker for her MS, and she is concerned about her walker not working because of the stopper being messed up. She is trying to convert to the cane but it is a slow process. She was taking care of her mother in law, and then ended up having to use a walker for 4-5 years. She has been married for 24 years, and her oldest daughter is NCCU and her youngest daughter is 94. She stays on the bottom floor because of being unable to walk. She has a chair lift. She has a dog who is 9 and 17 pounds, he is a [employer] mixed [employer], she reports he is a good dog. She does not do a lot of movement, because of the walking being uncomfortable. Her daughter does the cooking and puts of the groceries. She reports her husband and works two jobs and he is off only on Friday.   Consent to Services:  The patient was given the following information about Chronic Care Management services today, agreed to services, and gave verbal consent: 1. CCM service includes personalized support from designated clinical staff supervised by the primary care provider, including individualized plan of care and coordination with other care providers 2. 24/7 contact phone numbers for assistance  for urgent and routine care needs. 3. Service will only be billed when office clinical staff spend 20 minutes or more in a month to coordinate care. 4. Only one practitioner may furnish and bill the service in a calendar month. 5.The patient may stop CCM services at any time (effective at the end of the month) by phone call to the office staff. 6. The patient will be responsible for cost sharing (co-pay) of up to 20% of the service fee (after annual deductible is met). Patient agreed to services and consent obtained.  Patient Care Team: [name], [place] as PCP - General (General Practice) Rex Kras, Claudette Stapler, RN as Case Manager Daneen Schick as Social Worker Mayford Knife, [facility] (Pharmacist)  Recent office visits:  05/17/2021- Daneen Schick (CCM) Social Worker 05/15/2021- Angle Little, RN (CCM) Nurse    05/07/2021- [name], NP (PCP)-  Orders only Encounter- Metformin 500 mg- 1 tablet twice daily with meals added.    05/01/2021- [name], NP (PCP) - Patient presented for a follow up visit on Leukocytes in urine, Prediabetes, Boil of buttock, Skin yeast infection. Doxycycline Hyclate 100 mg- 1 tablet 2 times daily for 7 days; Ketoconazole 2% cream- Apply 1 application topically daily; Nystatin 1000000 unit/gm powder- Apply 1 application topically 3 (three) times daily started. Cyanocobalamin 500 mcg formation changed sublingual tablets to Lozgenes- take 2 daily. Furosemide 66m, HCTZ 12.5 mg and Sulfamethoxazole-Trimethoprim 800-160 mg discontinued. Return in 3 months (on 08/01/2021), or physical exam   Recent  consult visits:   04/12/2021- Daneen Schick (CCM) Social Worker 03/25/2021-Kendra Humble (CCM) Social Worker 03/13/2021- Daneen Schick (CCM) Social Worker 02/18/2021- Juanell Fairly (Neurology)- Telephone Encounter- Ambulatory referral to Physical Therapy placed.  01/11/2021- Daneen Schick (CCM) Social Worker 12/24/2020- Barb Merino, RN (CCM) [facility] visits: None in  previous 6 months   Objective:  Lab Results  Component Value Date   CREATININE 0.71 05/01/2021   BUN 11 05/01/2021   GFRNONAA 84 02/20/2020   GFRAA 97 02/20/2020   NA 137 05/01/2021   K 4.4 05/01/2021   CALCIUM 9.7 05/01/2021   CO2 21 05/01/2021   GLUCOSE 443 (H) 05/01/2021    Lab Results  Component Value Date/Time   HGBA1C 11.8 (H) 05/01/2021 04:38 PM    Last diabetic Eye exam: No results found for: HMDIABEYEEXA  Last diabetic Foot exam: No results found for: HMDIABFOOTEX   No results found for: CHOL, HDL, LDLCALC, LDLDIRECT, TRIG, CHOLHDL  Hepatic Function Latest Ref Rng & Units 05/01/2021 02/20/2020 06/15/2019  Total Protein 6.0 - 8.5 g/dL 7.2 7.6 6.9  Albumin 3.8 - 4.9 g/dL 4.4 4.3 4.3  AST 0 - 40 IU/L _0 ALT 0 - 32 IU/L _1 Alk Phosphatase 44 - 121 IU/L 80 69 60  Total Bilirubin 0.0 - 1.2 mg/dL 0.3 0.3 0.3  Bilirubin, Direct 0.00 - 0.40 mg/dL - - -    Lab Results  Component Value Date/Time   TSH 4.120 08/02/2020 04:44 PM   TSH 4.53 03/25/2020 12:00 AM    CBC Latest Ref Rng & Units 05/01/2021 08/02/2020 06/15/2019  WBC 3.4 - 10.8 x10E3/uL 8.0 9.7 8.2  Hemoglobin 11.1 - 15.9 g/dL 14.0 15.4 13.5  Hematocrit 34.0 - 46.6 % 41.5 45.8 39.8  Platelets 150 - 450 x10E3/uL 291 310 277    Lab Results  Component Value Date/Time   VD25OH 52.2 06/15/2019 04:31 PM   VD25OH 48.5 10/23/2015 10:15 AM    Clinical ASCVD: No  The ASCVD Risk score (Arnett DK, et al., 2019) failed to calculate for the following reasons:   Cannot find a previous HDL lab   Cannot find a previous total cholesterol lab    Depression screen [facility] 2/9 08/29/2020 02/20/2020 06/13/2019  Decreased Interest 0 0 0  Down, Depressed, Hopeless 0 0 0  PHQ - 2 Score 0 0 0     Social History   Tobacco Use  Smoking Status Never  Smokeless Tobacco Never   BP Readings from Last 3 Encounters:  05/01/21 132/80  09/09/20 140/73  08/02/20 139/90   Pulse Readings from Last 3 Encounters:  05/01/21  (!) 108  09/09/20 (!) 113  08/02/20 92   Wt Readings from Last 3 Encounters:  09/18/20 290 lb (131.5 kg)  08/29/20 290 lb (131.5 kg)  08/29/20 290 lb (131.5 kg)   BMI Readings from Last 3 Encounters:  09/18/20 46.81 kg/m  08/29/20 45.42 kg/m  08/29/20 45.42 kg/m    Assessment/Interventions: Review of patient past medical history, allergies, medications, health status, including review of consultants reports, laboratory and other test data, was performed as part of comprehensive evaluation and provision of chronic care management services.   SDOH:  (Social Determinants of Health) assessments and interventions performed: Yes  SDOH Screenings   Alcohol Screen: Not on file  Depression (PHQ2-9): Low Risk    PHQ-2 Score: 0  Financial Resource Strain: Low Risk    Difficulty of Paying Living Expenses: Not very hard  Food Insecurity: No Food  Insecurity   Worried About Charity fundraiser in the Last Year: Never true   Ran Out of Food in the Last Year: Never true  Housing: Low Risk    Last Housing Risk Score: 0  Physical Activity: Inactive   Days of Exercise per Week: 0 days   Minutes of Exercise per Session: 0 min  Social Connections: Not on file  Stress: No Stress Concern Present   Feeling of Stress : Not at all  Tobacco Use: Low Risk    Smoking Tobacco Use: Never   Smokeless Tobacco Use: Never  Transportation Needs: Unmet Transportation Needs   Lack of Transportation (Medical): Yes   Lack of Transportation (Non-Medical): Yes    CCM Care Plan  Allergies  Allergen Reactions   Food Other (See Comments)    WHAT FOOD CAUSES THIS REACTION? Eyes swelling and throat swelling   Oxycodone Hcl Hives   Soy Allergy     Other reaction(s): SWELLING    Medications Reviewed Today     Reviewed by Mayford Knife, RPH (Pharmacist) on 05/21/21 at 1415  Med List Status: <None>   Medication Order Taking? Sig Documenting Provider Last Dose Status Informant  Cholecalciferol  (VITAMIN D3) 50 MCG (2000 UT) TABS [id number] Yes TAKE 1 TABLET BY MOUTH EVERY DAY OR AS DIRECTED Sater, Nanine Means, MD Taking Active   [facility] B12 QUICK DISSOLVE 500 MCG LOZG [id number] Yes TAKE 2 TABS BY MOUTH DAILY Sater, Nanine Means, MD Taking Active   Dimethyl Fumarate 240 MG CPDR [id number] Yes Take 240 mg by mouth 2 (two) times daily. [provider] Taking Active            Med Note Ronnald Ramp, EMMA L   Tue Nov 06, 2020 11:42 AM) CaseId:[id number];Status:Approved;Review Type:Prior Auth;Coverage Start Date:10/07/2020;Coverage End Date:11/06/2021;  doxycycline (VIBRAMYCIN) 100 MG capsule [id number] No Take 1 capsule (100 mg total) by mouth 2 (two) times daily.  Patient not taking: Reported on 05/21/2021   [name], NP Not Taking Active   ketoconazole (NIZORAL) 2 % cream [id number] Yes Apply 1 application topically daily. [name], NP Taking Active   metFORMIN (GLUCOPHAGE) 500 MG tablet [id number] Yes Take 1 tablet (500 mg total) by mouth 2 (two) times daily with a meal. Ghumman, Ramandeep, NP Taking Active   nystatin powder [id number] Yes Apply 1 application topically 3 (three) times daily. [name], NP Taking Active   Probiotic Product (PROBIOTIC PO) [id number] No Take 1 Dose by mouth daily.  Patient not taking: Reported on 05/21/2021   [provider] Not Taking Active   TURMERIC-GINGER PO [id number] Yes Take 2 each by mouth daily. Gummies [provider] Taking Active   UNABLE TO FIND [id number] Yes Med Name: Terrence Dupont take 2 tablets daily [provider] Taking Active             Patient Active Problem List   Diagnosis Date Noted   Edema 08/29/2020   Lymphedema 08/02/2020   Stress 08/02/2020   Gait abnormality 08/02/2020   Acute urinary tract infection 10/28/2018   High risk medication use 10/21/2016   Urinary frequency 04/22/2016   Multiple sclerosis ([place]) 02/01/2015   Gait disturbance 02/01/2015   Left foot drop 02/01/2015   Other  fatigue 02/01/2015   DS (disseminated sclerosis) ([place]) 01/06/2013   Avitaminosis D 01/06/2013    Immunization History  Administered Date(s) Administered   Influenza,inj,Quad PF,6+ Mos 06/21/2019   PFIZER Comirnaty(Gray Top)Covid-19 Tri-Sucrose Vaccine 03/25/2021   PFIZER(Purple Top)SARS-COV-2 Vaccination 02/02/2020, 03/13/2020, 09/25/2020  Conditions to be addressed/monitored:  Hyperlipidemia and Diabetes  Care Plan : [place]  Updates made by Mayford Knife, RPH since 05/29/2021 12:00 AM     Problem: DM, MS, Immunization status      Long-Range Goal: Disease Management   This Visit's Progress: On track  Note:   Current Barriers:  Unable to independently monitor therapeutic efficacy  Pharmacist Clinical Goal(s):  Patient will achieve adherence to monitoring guidelines and medication adherence to achieve therapeutic efficacy through collaboration with PharmD and provider.   Interventions: 1:1 collaboration with [name], FNP regarding development and update of comprehensive plan of care as evidenced by provider attestation and co-signature Inter-disciplinary care team collaboration (see longitudinal plan of care) Comprehensive medication review performed; medication list updated in electronic medical record  Diabetes (A1c goal <7%) -Uncontrolled -Current medications: Metformin 500 mg tablet twice per day -Medications previously tried: none noted   -Current home glucose readings fasting glucose: none noted  -Denies hypoglycemic/hyperglycemic symptoms -Current meal patterns:  breakfast: cereal, she can not get in the kitchen and make anything  lunch: whatever is in the house, was hoping to do meals on wheels   dinner: does not eat pasta, she enjoys bread and wraps, chicken with vegetables  snacks:honey gram crackers  drinks: water -Current exercise: she is walking, she is doing weight exercises as well and does some stretching. She also does up and  down before she goes to her chair.  -Increase the amount of healthy foods that she is eating.  -Educated on A1c and blood sugar goals; -Patient reports that she is [facility] for [facility] to start checking her BS before she eats and after she eats.  -Counseled to check feet daily and get yearly eye exams -She would like to increase her exercise  -Recommended to continue current medication -Recommend patient have lipid panel completed.  -Will collaborate with PCP team to have patients glucometer sent to [facility].   Multiple Sclerosis (Goal: to decrease symptoms) -Controlled -Current treatment  Dimethyl Fumarate 240 mg - taking 2 tablets by mouth daily  -Medications previously tried: none noted  -Recommended to continue current medication  Health Maintenance -Vaccine gaps:      -Pneumococcal Vaccine      -TDAP     -Shingrix Vaccine     -Influenza Vaccine -Current therapy:  Probiotic -Educated on Supplements may interfere with prescription drugs -Patient reports having an issue with her walker.  -Will let CCM social worker know she is okay with a call  -Patient is satisfied with current therapy and denies issues -Recommended to continue current medication   Patient Goals/Self-Care Activities Patient will:  - take medications as prescribed check glucose at least twice per day, document, and provide at future appointments  Follow Up Plan: The patient has been provided with contact information for the care management team and has been advised to call with any health related questions or concerns.       Medication Assistance: None required.  Patient affirms current coverage meets needs.  Compliance/Adherence/Medication fill history: Care Gaps: Pneumonia Vaccine Urine Microalbumin TDAP SHINGRIX PAP smear Colonoscopy Mammogram Influenza Vaccine  Star-Rating Drugs: Metformin 500 mg tablet  [facility]  [id number] [place] [address]Phone: [phone]: [phone] [place] [address]Phone: [phone]: [phone]  Uses pill box? Yes Pt endorses 80% compliance  We discussed: Benefits of medication synchronization, packaging and delivery as well as enhanced pharmacist oversight with [facility]. Patient decided to: Utilize [facility] pharmacy for medication synchronization, packaging and delivery  Care Plan and Follow Up Patient Decision:  Patient agrees to Care Plan and Follow-up.  Plan: The patient has been provided with contact information for the care management team and has been advised to call with any health related questions or concerns.   [name], PharmD Clinical Pharmacist [facility] [phone]

## 2021-05-22 ENCOUNTER — Telehealth: Payer: Self-pay | Admitting: Neurology

## 2021-05-22 NOTE — Telephone Encounter (Signed)
FYI- pt called wanting to let doctor know she was diagnosed with type 2 Diabetes last week and she started her Metformin on Sunday. Any questions she states you an give her a call, she still plans on getting her MRI done if everything goes well.

## 2021-05-22 NOTE — Telephone Encounter (Signed)
Noted we appreciate the information.

## 2021-05-23 NOTE — Patient Instructions (Signed)
Goals Addressed      Glycemic Management Optimized   On track    Timeframe:  Long-Range Goal Priority:  High Start Date:  05/15/21                           Expected End Date:  05/15/22  Next Scheduled follow up: 07/16/21   Patient Goals: - check blood sugar at prescribed times - check blood sugar if I feel it is too high or too low - take the blood sugar meter to all doctor visits - drink 6 to 8 glasses of water each day - fill half of plate with vegetables - manage portion size - keep appointment with eye doctor - schedule appointment with eye doctor - check feet daily for cuts, sores or redness - do heel pump exercise 2 to 3 times each day - keep feet up while sitting - trim toenails straight across - wash and dry feet carefully every day - wear comfortable, cotton socks - wear comfortable, well-fitting shoes                        Impaired Skin Integrity complications prevented or minimized   On track    Timeframe:  Long-Range Goal Priority:  High Start Date:  05/15/21                           Expected End Date:  05/15/22    Next Scheduled follow up: 07/16/21      Patient Goals: - perform daily skin inspections and report new or worsening symptoms to PCP promptly - keep skin clean and dry - keep lower extremities elevated to help reduce swelling and potential risk for skin breakdown                   Multiple Sclerosis - disease progression minimized or prevented   On track    Timeframe:  Long-Range Goal Priority:  High Start Date:  12/24/20                           Expected End Date:  12/24/21   Next Follow Up date: 07/16/21     Patient Goals: - avoid triggers that may cause MS flare - remain independent and perform self care as needed                    '

## 2021-05-23 NOTE — Chronic Care Management (AMB) (Signed)
Chronic Care Management   CCM RN Visit Note  05/15/2021 Name: [name] MRN: [id number] DOB: 1966-02-16  Subjective: [name] is a 55 y.o. year old female who is a primary care patient of Arnette Felts, FNP. The care management team was consulted for assistance with disease management and care coordination needs.    Engaged with patient by telephone for follow up visit in response to provider referral for case management and/or care coordination services.   Consent to Services:  The patient was given information about Chronic Care Management services, agreed to services, and gave verbal consent prior to initiation of services.  Please see initial visit note for detailed documentation.   Patient agreed to services and verbal consent obtained.   Assessment: Review of patient past medical history, allergies, medications, health status, including review of consultants reports, laboratory and other test data, was performed as part of comprehensive evaluation and provision of chronic care management services.   SDOH (Social Determinants of Health) assessments and interventions performed:  Yes, no acute challenges   CCM Care Plan  Allergies  Allergen Reactions   Food Other (See Comments)    WHAT FOOD CAUSES THIS REACTION? Eyes swelling and throat swelling   Oxycodone Hcl Hives   Soy Allergy     Other reaction(s): SWELLING    Outpatient Encounter Medications as of 05/15/2021  Medication Sig Note   Cholecalciferol (VITAMIN D3) 50 MCG (2000 UT) TABS TAKE 1 TABLET BY MOUTH EVERY DAY OR AS DIRECTED    [facility] B12 QUICK DISSOLVE 500 MCG LOZG TAKE 2 TABS BY MOUTH DAILY    Dimethyl Fumarate 240 MG CPDR Take 240 mg by mouth 2 (two) times daily. 11/06/2020: CaseId:[id number];Status:Approved;Review Type:Prior Auth;Coverage Start Date:10/07/2020;Coverage End Date:11/06/2021;   doxycycline (VIBRAMYCIN) 100 MG capsule Take 1 capsule (100 mg total) by mouth 2 (two) times daily. (Patient not  taking: Reported on 05/21/2021)    ketoconazole (NIZORAL) 2 % cream Apply 1 application topically daily.    nystatin powder Apply 1 application topically 3 (three) times daily.    Probiotic Product (PROBIOTIC PO) Take 1 Dose by mouth daily. (Patient not taking: Reported on 05/21/2021)    TURMERIC-GINGER PO Take 2 each by mouth daily. Gummies    UNABLE TO FIND Med Name: [name] take 2 tablets daily    [DISCONTINUED] metFORMIN (GLUCOPHAGE) 500 MG tablet Take 1 tablet (500 mg total) by mouth 2 (two) times daily with a meal.    No facility-administered encounter medications on file as of 05/15/2021.    Patient Active Problem List   Diagnosis Date Noted   Edema 08/29/2020   Lymphedema 08/02/2020   Stress 08/02/2020   Gait abnormality 08/02/2020   Acute urinary tract infection 10/28/2018   High risk medication use 10/21/2016   Urinary frequency 04/22/2016   Multiple sclerosis (HCC) 02/01/2015   Gait disturbance 02/01/2015   Left foot drop 02/01/2015   Other fatigue 02/01/2015   DS (disseminated sclerosis) (HCC) 01/06/2013   Avitaminosis D 01/06/2013    Conditions to be addressed/monitored: Diabetes Mellitus due to underlying condition with type 1 chronic kidney disease; Multiple Sclerosis  Care Plan : Impaired Skin Integrity  Updates made by Riley Churches, RN since 05/15/2021 12:00 AM     Problem: Skin Integrity   Priority: High     Long-Range Goal: Impaired Skin Integrity complications prevented or minimized   Start Date: 05/15/2021  Expected End Date: 05/15/2022  This Visit's Progress: On track  Priority: High  Note:  Current Barriers:  Ineffective Self Health Maintenance in a patient with Diabetes Mellitus due to underlying condition with type 1 chronic kidney disease; Multiple Sclerosis Unable to perform IADLs independently Clinical Goal(s):  Collaboration with [name], FNP regarding development and update of comprehensive plan of care as evidenced by provider attestation  and co-signature Inter-disciplinary care team collaboration (see longitudinal plan of care) patient will work with care management team to address care coordination and chronic disease management needs related to Disease Management Educational Needs Care Coordination Medication Management and Education Psychosocial Support   Interventions:  05/15/21 completed successful outbound call with patient  Evaluation of current treatment plan related to  Impaired Skin Integrity  ,  self-management and patient's adherence to plan as established by provider. Collaboration with [name], FNP regarding development and update of comprehensive plan of care as evidenced by provider attestation       and co-signature Inter-disciplinary care team collaboration (see longitudinal plan of care) Determined patient continues to have intermittent impaired skin integrity Educated on risk for skin related issues secondary to uncontrolled DM and edema to lower extremities  Review of patient status, including review of consultant's reports, relevant laboratory and other test results, and medications completed. Reviewed medications with patient and discussed importance of medication adherence Educated on importance of performing daily skin inspections to help identify new or worsening skin breakdown/rash Educated on importance to keep skin clean and dry and to elevate lower extremities while resting to help reduce swelling and potential for impaired skin integrity Instructed patient to report early signs of skin breakdown and or rash promptly  Discussed plans with patient for ongoing care management follow up and provided patient with direct contact information for care management team Self Care Activities:  Self administers medications as prescribed Attends all scheduled provider appointments Calls pharmacy for medication refills Calls provider office for new concerns or questions Patient Goals: - perform daily  skin inspections and report new or worsening symptoms to PCP promptly - keep skin clean and dry - keep lower extremities elevated to help reduce swelling and potential risk for skin breakdown  Follow Up Plan: Telephone follow up appointment with care management team member scheduled for: 07/16/21    Care Plan : Multiple Sclerosis  Updates made by Lynne Logan, RN since 05/15/2021 12:00 AM     Problem: Multiple Sclerosis   Priority: High     Long-Range Goal: Multiple Sclerosis - disease progression minimized or prevented   Start Date: 12/24/2020  Expected End Date: 12/24/2021  Recent Progress: On track  Priority: High  Note:   Current Barriers:  Ineffective Self Health Maintenance  Unable to perform ADLs independently Unable to perform IADLs independently Impaired Physical Mobility  Clinical Goal(s):  Collaboration with [name], FNP regarding development and update of comprehensive plan of care as evidenced by provider attestation and co-signature Inter-disciplinary care team collaboration (see longitudinal plan of care) patient will work with care management team to address care coordination and chronic disease management needs related to Disease Management Educational Needs Care Coordination Medication Management and Education Psychosocial Support   Interventions:  05/15/21 completed successful outbound call with patient  Evaluation of current treatment plan related to  Multiple Sclerosis , self-management and patient's adherence to plan as established by provider. Collaboration with [name], FNP regarding development and update of comprehensive plan of care as evidenced by provider attestation       and co-signature Inter-disciplinary care team collaboration (see longitudinal plan of care) Provided  education to patient about basic disease process related to Multiple Sclerosis Review of patient status, including review of consultants reports, relevant laboratory  and other test results, and medications completed. Reviewed medications with patient and discussed importance of medication adherence Assessed for disease exacerbation, patient denies; Educated on potential triggers that may exacerbate MS and instructed patient to alert Dr. Felecia Shelling promptly of signs and symptoms suggestive of MS flare Reviewed scheduled brain and cervical spine MRI per Dr. Felecia Shelling set for 05/28/21 Discussed plans with patient for ongoing care management follow up and provided patient with direct contact information for care management team Self Care Activities:  Continue to keep all scheduled follow up appointments Take medications as directed  Let your healthcare team know if you are unable to take your medications Call your pharmacy for refills at least 7 days prior to running out of medication Notify Dr. Felecia Shelling promptly for signs/symptoms suggestive of MS exacerbation  Patient Goals: - avoid triggers that may cause MS flare - remain independent and perform self care as needed       Follow Up Plan: Face to Face appointment with care management team member scheduled for:  07/16/21    Care Plan : Diabetes Type 2 (Adult)  Updates made by Lynne Logan, RN since 05/15/2021 12:00 AM     Problem: Glycemic Management (Diabetes, Type 2)   Priority: High     Long-Range Goal: Glycemic Management Optimized   Start Date: 05/15/2021  Expected End Date: 05/15/2022  Priority: High  Note:   Objective:  Lab Results  Component Value Date   HGBA1C 11.8 (H) 05/01/2021   Lab Results  Component Value Date   CREATININE 0.71 05/01/2021   CREATININE 0.80 02/20/2020   CREATININE 0.76 06/15/2019   Lab Results  Component Value Date   EGFR 100 05/01/2021   Current Barriers:  Knowledge Deficits related to basic Diabetes pathophysiology and self care/management Knowledge Deficits related to medications used for management of diabetes Impaired Physical Mobility  Case Manager Clinical  Goal(s):  patient will demonstrate improved adherence to prescribed treatment plan for diabetes self care/management as evidenced by: daily monitoring and recording of CBG  adherence to ADA/ carb modified diet exercise 5 days/week adherence to prescribed medication regimen contacting provider for new or worsened symptoms or questions Interventions:  05/15/21 completed successful outbound call with patient  Collaboration with [name], FNP regarding development and update of comprehensive plan of care as evidenced by provider attestation and co-signature Inter-disciplinary care team collaboration (see longitudinal plan of care) Provided education to patient about basic DM disease process including complications that may occur for uncontrolled DM Review of patient status, including review of consultant's reports, relevant laboratory and other test results, and medications completed. Reviewed medications with patient and discussed importance of medication adherence Answered patient's questions pertaining to Metformin including dosage, frequency and indication for use; Determined patient is reluctant to start medication due to fear of SE Sent referral to embedded Pharm D [name] with request to assist patient with questions regarding Metformin as well as holistic therapies Mrs. Jutte is considering  Educated patient on dietary and exercise recommendations; daily glycemic control FBS 80-130, <180 after meals;15'15' rule Determined patient does not have a glucometer and has knowledge deficit on how to self monitor her blood glucose levels Collaborated with PCP provider [name] FNP with request for Rx for a glucometer kit to be sent to patient's preferred pharmacy  Advised patient accordingly regarding how to receive instructional use of  her glucometer once received  Advised patient, providing education and rationale, to check cbg daily before meals and at bedtime and record, calling the  CCM team and or PCP for findings outside established parameters Reviewed scheduled/upcoming provider appointments including: next PCP follow up appointment scheduled for 09/11/21 $RemoveBefo'@2'KCcvHEcYKUC$ :00 PM; next follow up with Dr. Felecia Shelling, Neurology scheduled for 06/27/21 $RemoveBefo'@3'jMzbZcKDsPI$ :30 PM  Mailed printed educational materials related to Diabetes Management  Discussed plans with patient for ongoing care management follow up and provided patient with direct contact information for care management team Self-Care Activities Self administers oral medications as prescribed Attends all scheduled provider appointments Checks blood sugars as prescribed and utilize hyper and hypoglycemia protocol as needed Adheres to prescribed ADA/carb modified Patient Goals: - check blood sugar at prescribed times - check blood sugar if I feel it is too high or too low - take the blood sugar meter to all doctor visits - drink 6 to 8 glasses of water each day - fill half of plate with vegetables - manage portion size - keep appointment with eye doctor - schedule appointment with eye doctor - check feet daily for cuts, sores or redness - do heel pump exercise 2 to 3 times each day - keep feet up while sitting - trim toenails straight across - wash and dry feet carefully every day - wear comfortable, cotton socks - wear comfortable, well-fitting shoes  Follow Up Plan: Telephone follow up appointment with care management team member scheduled for: 07/16/21    Plan:Telephone follow up appointment with care management team member scheduled for:  07/16/21  Barb Merino, RN, BSN, CCM Care Management Coordinator [facility]  Direct Phone: [phone]

## 2021-05-28 ENCOUNTER — Other Ambulatory Visit: Payer: Medicare Other

## 2021-05-28 ENCOUNTER — Telehealth: Payer: BC Managed Care – PPO

## 2021-05-29 NOTE — Patient Instructions (Signed)
Visit Information It was great speaking with you today!  Please let me know if you have any questions about our visit.   Goals Addressed             This Visit's Progress    Manage My Medicine       Timeframe:  Long-Range Goal Priority:  High Start Date:                             Expected End Date:                       Follow Up Date 06/26/2021    - call for medicine refill 2 or 3 days before it runs out - call if I am sick and can't take my medicine - keep a list of all the medicines I take; vitamins and herbals too - use a pillbox to sort medicine - use an alarm clock or phone to remind me to take my medicine    Why is this important?   These steps will help you keep on track with your medicines.   Notes:  Please contact us if you have any questions about your medications         Patient Care Plan: CCM Pharmacy Care Plan     Problem Identified: DM, MS, Immunization status      Long-Range Goal: Disease Management   This Visit's Progress: On track  Note:   Current Barriers:  Unable to independently monitor therapeutic efficacy  Pharmacist Clinical Goal(s):  Patient will achieve adherence to monitoring guidelines and medication adherence to achieve therapeutic efficacy through collaboration with PharmD and provider.   Interventions: 1:1 collaboration with [name], FNP regarding development and update of comprehensive plan of care as evidenced by provider attestation and co-signature Inter-disciplinary care team collaboration (see longitudinal plan of care) Comprehensive medication review performed; medication list updated in electronic medical record  Diabetes (A1c goal <7%) -Uncontrolled -Current medications: Metformin 500 mg tablet twice per day -Medications previously tried: none noted   -Current home glucose readings fasting glucose: none noted  -Denies hypoglycemic/hyperglycemic symptoms -Current meal patterns:  breakfast: cereal, she can  not get in the kitchen and make anything  lunch: whatever is in the house, was hoping to do meals on wheels   dinner: does not eat pasta, she enjoys bread and wraps, chicken with vegetables  snacks:honey gram crackers  drinks: water -Current exercise: she is walking, she is doing weight exercises as well and does some stretching. She also does up and down before she goes to her chair.  -Increase the amount of healthy foods that she is eating.  -Educated on A1c and blood sugar goals; -Patient reports that she is waiting [facility] for her to start checking her BS before she eats and after she eats.  -Counseled to check feet daily and get yearly eye exams -She would like to increase her exercise  -Recommended to continue current medication -Recommend patient have lipid panel completed.  -Will collaborate with PCP team to have patients glucometer sent to [facility].   Multiple Sclerosis (Goal: to decrease symptoms) -Controlled -Current treatment  Dimethyl Fumarate 240 mg - taking 2 tablets by mouth daily  -Medications previously tried: none noted  -Recommended to continue current medication  Health Maintenance -Vaccine gaps:      -Pneumococcal Vaccine      -TDAP     -  Shingrix Vaccine     -Influenza Vaccine -Current therapy:  Probiotic -Educated on Supplements may interfere with prescription drugs -Patient reports having an issue with her walker.  -Will let CCM social worker know she is okay with a call  -Patient is satisfied with current therapy and denies issues -Recommended to continue current medication   Patient Goals/Self-Care Activities Patient will:  - take medications as prescribed check glucose at least twice per day, document, and provide at future appointments  Follow Up Plan: The patient has been provided with contact information for the care management team and has been advised to call with any health related questions or  concerns.        Ms. Segner was given information about Chronic Care Management services today including:  CCM service includes personalized support from designated clinical staff supervised by her physician, including individualized plan of care and coordination with other care providers 24/7 contact phone numbers for assistance for urgent and routine care needs. Standard insurance, coinsurance, copays and deductibles apply for chronic care management only during months in which we provide at least 20 minutes of these services. Most insurances cover these services at 100%, however patients may be responsible for any copay, coinsurance and/or deductible if applicable. This service may help you avoid the need for more expensive face-to-face services. Only one practitioner may furnish and bill the service in a calendar month. The patient may stop CCM services at any time (effective at the end of the month) by phone call to the office staff.  Patient agreed to services and verbal consent obtained.   The patient verbalized understanding of instructions, educational materials, and care plan provided today and agreed to receive a mailed copy of patient instructions, educational materials, and care plan.   [name], PharmD Clinical Pharmacist [facility] [phone]

## 2021-05-30 ENCOUNTER — Telehealth: Payer: Self-pay

## 2021-05-30 NOTE — Chronic Care Management (AMB) (Signed)
Started patient's onboarding for [facility]   Metformin 500 mg twice daily from PCP- Last filled 05-07-2021 90 DS next fill 08-05-2021  Vitamin D3 2000 units daily from Dr. Arlice Colt- Last filled 04-13-2021 90 DS next fill 07-12-2021  Dimethyl Fumarate 240 mg twice daily from Dr. Arlice Colt- Last filled 04-24-2021 30 DS next fill 05-24-2021  B-12 500 mcg quick dissolve twice daily from Dr. Arlice Colt- Last filled 03-07-2021 50 DS next fill 04-26-2021  Doxycycline 100 mg twice daily from PCP- Last filled 05-01-2021 7 DS next fill 05-13-2021  Ketoconazole 2% cream daily from PCP- Last filled 05-01-2021 30 DS next fill 05-31-2021  Nystatin powder 100,000 unit three times daily from PCP- Last filled 05-01-2021 15 DS next fill 05-16-2021  Probiotic daily  Turmeric-ginger twice daily  Terrence Dupont twice daily  [place] Clinical Pharmacist Assistant [phone]

## 2021-05-31 ENCOUNTER — Telehealth: Payer: Self-pay

## 2021-05-31 NOTE — Chronic Care Management (AMB) (Signed)
    Chronic Care Management Pharmacy Assistant   Name: [name]  MRN: [id number] DOB: Jul 21, 1966  Reason for Encounter: Enhanced Pharmacy Coordination    05/31/2021- Spoke with patient, she is currently taking Metformin 500 mg 2 tablets a day and using Freestyle monitor and supplies to check her blood sugars. Patient picked up prescription on 05/21/2021 for a 30 day supply, Patient aware we will start delivery with [facility] 06/20/2021.  06/06/2021- Received a message regarding patients diabetic supplies not being covered, called [employer] and [employer], spoke with pharamcy department, we went down a list of diabetes supplies by brand and none were covered by patients insurance. Spoke with benefits department and per patient plan, patient will need to get her diabetic supplies through a medical supply store in order for them to be covered at 100%. Message sent to [facility] requesting order for patient Freestyle Lite Device with test strips and lancets to be ordered through a medial supply store. Called patient to inform, she is aware and ok with this plan, she understands that for now she will only get her Metformin from [facility]. [name], CPP notified.   Medications: Outpatient Encounter Medications as of 05/31/2021  Medication Sig Note   Blood Glucose Monitoring Suppl (FREESTYLE LITE) w/Device KIT Inject 1 each into the skin 2 (two) times daily. dxE11.65    Cholecalciferol (VITAMIN D3) 50 MCG (2000 UT) TABS TAKE 1 TABLET BY MOUTH EVERY DAY OR AS DIRECTED    [facility] B12 QUICK DISSOLVE 500 MCG LOZG TAKE 2 TABS BY MOUTH DAILY    Dimethyl Fumarate 240 MG CPDR Take 240 mg by mouth 2 (two) times daily. 11/06/2020: CaseId:[id number];Status:Approved;Review Type:Prior Auth;Coverage Start Date:10/07/2020;Coverage End Date:11/06/2021;   doxycycline (VIBRAMYCIN) 100 MG capsule Take 1 capsule (100 mg total) by mouth 2 (two) times daily. (Patient not taking: Reported on 05/21/2021)    glucose blood  (FREESTYLE LITE) test strip Use as instructed to check blood sugars 2 times per day dx:e11.65    ketoconazole (NIZORAL) 2 % cream Apply 1 application topically daily.    metFORMIN (GLUCOPHAGE) 500 MG tablet Take 1 tablet (500 mg total) by mouth 2 (two) times daily with a meal.    Microlet Lancets MISC Use as directed to check blood sugars 2 times per day dx: e11.65    nystatin powder Apply 1 application topically 3 (three) times daily.    Probiotic Product (PROBIOTIC PO) Take 1 Dose by mouth daily. (Patient not taking: Reported on 05/21/2021)    TURMERIC-GINGER PO Take 2 each by mouth daily. Gummies    UNABLE TO FIND Med Name: Terrence Dupont take 2 tablets daily    No facility-administered encounter medications on file as of 05/31/2021.   [name], [place] Pharmacist Assistant [phone]

## 2021-06-03 ENCOUNTER — Encounter: Payer: Self-pay | Admitting: *Deleted

## 2021-06-03 ENCOUNTER — Telehealth: Payer: Self-pay | Admitting: Neurology

## 2021-06-03 NOTE — Telephone Encounter (Signed)
Pt called wanting to know if she could have another letter stating her diagnoses with MS. Pt asking for it to be emailed to her at [email or web address].

## 2021-06-03 NOTE — Telephone Encounter (Signed)
Done letter e-mail today.

## 2021-06-03 NOTE — Telephone Encounter (Signed)
[name]- we wrote letter. Can you please email it to her? Thank you

## 2021-06-05 ENCOUNTER — Ambulatory Visit: Payer: BC Managed Care – PPO

## 2021-06-05 ENCOUNTER — Ambulatory Visit: Payer: Self-pay

## 2021-06-05 DIAGNOSIS — E0822 Diabetes mellitus due to underlying condition with diabetic chronic kidney disease: Secondary | ICD-10-CM

## 2021-06-05 DIAGNOSIS — G35 Multiple sclerosis: Secondary | ICD-10-CM

## 2021-06-05 DIAGNOSIS — N181 Chronic kidney disease, stage 1: Secondary | ICD-10-CM

## 2021-06-05 NOTE — Patient Instructions (Signed)
Social Worker Visit Information  Goals we discussed today:   Goals Addressed             This Visit's Progress    Quality of life maintained   On track    Timeframe:  Long-Range Goal Priority:  Low Start Date:  2.24.22                            Next planned outreach date: 10.19.22  Patient Goals/Self-Care Activities patient will:   Environmental manager pharmacy regarding glucometer coverage  -Contact SW as needed prior to next scheduled call -Review resource information         Materials Provided: Yes: verbal and written resource information about meals on wheels  Patient verbalizes understanding of instructions provided today and agrees to view in [place].   Follow Up Plan: SW will follow up with patient by phone over the next month   Daneen Schick, BSW, CDP Social Worker, Certified Dementia Practitioner [place] [facility] [place] [facility] [phone]

## 2021-06-05 NOTE — Chronic Care Management (AMB) (Signed)
Chronic Care Management    Social Work Note  06/05/2021 Name: [name] MRN: [id number] DOB: 1966-03-29  [name] is a 55 y.o. year old female who is a primary care patient of [name], [place]. The CCM team was consulted to assist the patient with chronic disease management and/or care coordination needs related to:  DM and Multiple Sclerosis .   Engaged with patient by telephone for follow up visit in response to provider referral for social work chronic care management and care coordination services.   Consent to Services:  The patient was given information about Chronic Care Management services, agreed to services, and gave verbal consent prior to initiation of services.  Please see initial visit note for detailed documentation.   Patient agreed to services and consent obtained.   Assessment: Review of patient past medical history, allergies, medications, and health status, including review of relevant consultants reports was performed today as part of a comprehensive evaluation and provision of chronic care management and care coordination services.     SDOH (Social Determinants of Health) assessments and interventions performed:    Advanced Directives Status: Not addressed in this encounter.  CCM Care Plan  Allergies  Allergen Reactions   Food Other (See Comments)    WHAT FOOD CAUSES THIS REACTION? Eyes swelling and throat swelling   Oxycodone Hcl Hives   Soy Allergy     Other reaction(s): SWELLING    Outpatient Encounter Medications as of 06/05/2021  Medication Sig Note   Blood Glucose Monitoring Suppl (FREESTYLE LITE) w/Device KIT Inject 1 each into the skin 2 (two) times daily. dxE11.65    Cholecalciferol (VITAMIN D3) 50 MCG (2000 UT) TABS TAKE 1 TABLET BY MOUTH EVERY DAY OR AS DIRECTED    [facility] B12 QUICK DISSOLVE 500 MCG LOZG TAKE 2 TABS BY MOUTH DAILY    Dimethyl Fumarate 240 MG CPDR Take 240 mg by mouth 2 (two) times daily. 11/06/2020:  CaseId:[id number];Status:Approved;Review Type:Prior Auth;Coverage Start Date:10/07/2020;Coverage End Date:11/06/2021;   doxycycline (VIBRAMYCIN) 100 MG capsule Take 1 capsule (100 mg total) by mouth 2 (two) times daily. (Patient not taking: Reported on 05/21/2021)    glucose blood (FREESTYLE LITE) test strip Use as instructed to check blood sugars 2 times per day dx:e11.65    ketoconazole (NIZORAL) 2 % cream Apply 1 application topically daily.    metFORMIN (GLUCOPHAGE) 500 MG tablet Take 1 tablet (500 mg total) by mouth 2 (two) times daily with a meal.    Microlet Lancets MISC Use as directed to check blood sugars 2 times per day dx: e11.65    nystatin powder Apply 1 application topically 3 (three) times daily.    Probiotic Product (PROBIOTIC PO) Take 1 Dose by mouth daily. (Patient not taking: Reported on 05/21/2021)    TURMERIC-GINGER PO Take 2 each by mouth daily. Gummies    UNABLE TO FIND Med Name: Terrence Dupont take 2 tablets daily    No facility-administered encounter medications on file as of 06/05/2021.    Patient Active Problem List   Diagnosis Date Noted   Edema 08/29/2020   Lymphedema 08/02/2020   Stress 08/02/2020   Gait abnormality 08/02/2020   Acute urinary tract infection 10/28/2018   High risk medication use 10/21/2016   Urinary frequency 04/22/2016   Multiple sclerosis ([place]) 02/01/2015   Gait disturbance 02/01/2015   Left foot drop 02/01/2015   Other fatigue 02/01/2015   DS (disseminated sclerosis) ([place]) 01/06/2013   Avitaminosis D 01/06/2013    Conditions to be addressed/monitored:  DMII and MS ; Limited access to food  Care Plan : Social Work Care Plan  Updates made by Daneen Schick since 06/05/2021 12:00 AM     Problem: Quality of Life (General Plan of Care)      Long-Range Goal: Quality of Life Maintained   Start Date: 11/08/2020  This Visit's Progress: On track  Recent Progress: On track  Priority: Low  Note:   Current Barriers:  Chronic disease management support  and education needs related to  Multiple Sclerosis and Vitamin B12 Deficiency   ADL IADL limitations  Social Worker Clinical Goal(s):  patient will work with SW to identify and address any acute and/or chronic care coordination needs related to the self health management of  Multiple Sclerosis and Vitamin B12 Deficiency    CCM SW Interventions:  Inter-disciplinary care team collaboration (see longitudinal plan of care) Collaboration with [name], FNP regarding development and update of comprehensive plan of care as evidenced by provider attestation and co-signature Successful outbound call placed to the patient to assess goal progression Confirmed receipt of mailed information on [employer] Advised the patient SW has been in contact with meals on wheels to determine patient can enroll in meals on wheels if she would like to pay out of pockets costs which consist of $100 deposit and $7.50 per meal Determined the patient is interested in meals on wheels due to the meal being hot so she will not have to worry about entering the kitchen to heat it up - patient indicates she may look into ordering meals next month as she does not have the funds at this time Provided the patient with verbal and written information on how to order meals through meals on wheels program Discussed the patient has yet to pick up glucometer due to concern with cost - patient reports the pharmacy ran the glucometer under her husbands insurance Encouraged the patient to contact her pharmacy to make sure they are also filing under her Medicare benefit - patient stated she would call Patient does endorse taking Metformin as prescribed - patient feels this medication is increasing sleepiness Encouraged the patient to keep up with sleep habits and contact her provider if sleepiness continues Collaboration with [name] FNP as well as embedded care management team to advise of barriers in obtaining glucometer as well as  patients concern Metformin may be leading to sleepiness Performed chart review to note patient has an upcomming MRI scheduled for 10/3 - no transportation barriers, patient reports a friend will drive her SW will follow up with the patient over the next month   Patient Goals/Self-Care Activities patient will:   Midwife pharmacy regarding glucometer coverage  -Contact SW as needed prior to next scheduled call -Review resource information  Follow Up Plan:  SW will follow up with the patient over the next month       Follow Up Plan: SW will follow up with patient by phone over the next month      Daneen Schick, BSW, CDP Social Worker, Certified Dementia Practitioner [place] [facility] [place] [facility] [phone]

## 2021-06-05 NOTE — Chronic Care Management (AMB) (Signed)
Chronic Care Management   CCM RN Visit Note  06/05/2021 Name: [name] MRN: [id number] DOB: 1966/03/09  Subjective: [name] is a 55 y.o. year old female who is a primary care patient of Christina Felts, FNP. The care management team was consulted for assistance with disease management and care coordination needs.    Engaged with patient by telephone for follow up visit in response to provider referral for case management and/or care coordination services.   Consent to Services:  The patient was given information about Chronic Care Management services, agreed to services, and gave verbal consent prior to initiation of services.  Please see initial visit note for detailed documentation.   Patient agreed to services and verbal consent obtained.   Assessment: Review of patient past medical history, allergies, medications, health status, including review of consultants reports, laboratory and other test data, was performed as part of comprehensive evaluation and provision of chronic care management services.   SDOH (Social Determinants of Health) assessments and interventions performed:    CCM Care Plan  Allergies  Allergen Reactions   Food Other (See Comments)    WHAT FOOD CAUSES THIS REACTION? Eyes swelling and throat swelling   Oxycodone Hcl Hives   Soy Allergy     Other reaction(s): SWELLING    Outpatient Encounter Medications as of 06/05/2021  Medication Sig Note   Blood Glucose Monitoring Suppl (FREESTYLE LITE) w/Device KIT Inject 1 each into the skin 2 (two) times daily. dxE11.65    Cholecalciferol (VITAMIN D3) 50 MCG (2000 UT) TABS TAKE 1 TABLET BY MOUTH EVERY DAY OR AS DIRECTED    [facility]S B12 QUICK DISSOLVE 500 MCG LOZG TAKE 2 TABS BY MOUTH DAILY    Dimethyl Fumarate 240 MG CPDR Take 240 mg by mouth 2 (two) times daily. 11/06/2020: CaseId:[id number];Status:Approved;Review Type:Prior Auth;Coverage Start Date:10/07/2020;Coverage End Date:11/06/2021;   doxycycline  (VIBRAMYCIN) 100 MG capsule Take 1 capsule (100 mg total) by mouth 2 (two) times daily. (Patient not taking: Reported on 05/21/2021)    glucose blood (FREESTYLE LITE) test strip Use as instructed to check blood sugars 2 times per day dx:e11.65    ketoconazole (NIZORAL) 2 % cream Apply 1 application topically daily.    metFORMIN (GLUCOPHAGE) 500 MG tablet Take 1 tablet (500 mg total) by mouth 2 (two) times daily with a meal.    Microlet Lancets MISC Use as directed to check blood sugars 2 times per day dx: e11.65    nystatin powder Apply 1 application topically 3 (three) times daily.    Probiotic Product (PROBIOTIC PO) Take 1 Dose by mouth daily. (Patient not taking: Reported on 05/21/2021)    TURMERIC-GINGER PO Take 2 each by mouth daily. Gummies    UNABLE TO FIND Med Name: [name] take 2 tablets daily    No facility-administered encounter medications on file as of 06/05/2021.    Patient Active Problem List   Diagnosis Date Noted   Edema 08/29/2020   Lymphedema 08/02/2020   Stress 08/02/2020   Gait abnormality 08/02/2020   Acute urinary tract infection 10/28/2018   High risk medication use 10/21/2016   Urinary frequency 04/22/2016   Multiple sclerosis ([facility]) 02/01/2015   Gait disturbance 02/01/2015   Left foot drop 02/01/2015   Other fatigue 02/01/2015   DS (disseminated sclerosis) ([facility]) 01/06/2013   Avitaminosis D 01/06/2013    Conditions to be addressed/monitored: Diabetes Mellitus due to underlying condition with type 1 chronic kidney disease; Multiple Sclerosis  Care Plan : Diabetes Type 2 (Adult)  Updates made by Lynne Logan, RN since 06/05/2021 12:00 AM     Problem: Glycemic Management (Diabetes, Type 2)   Priority: High     Long-Range Goal: Glycemic Management Optimized   Start Date: 05/15/2021  Expected End Date: 05/15/2022  Priority: High  Note:   Objective:  Lab Results  Component Value Date   HGBA1C 11.8 (H) 05/01/2021   Lab Results  Component Value Date    CREATININE 0.71 05/01/2021   CREATININE 0.80 02/20/2020   CREATININE 0.76 06/15/2019   Lab Results  Component Value Date   EGFR 100 05/01/2021   Current Barriers:  Knowledge Deficits related to basic Diabetes pathophysiology and self care/management Knowledge Deficits related to medications used for management of diabetes Impaired Physical Mobility  Case Manager Clinical Goal(s):  patient will demonstrate improved adherence to prescribed treatment plan for diabetes self care/management as evidenced by: daily monitoring and recording of CBG  adherence to ADA/ carb modified diet exercise 5 days/week adherence to prescribed medication regimen contacting provider for new or worsened symptoms or questions Interventions:  05/15/21 completed successful outbound call with patient  Collaboration with [name], [place] regarding development and update of comprehensive plan of care as evidenced by provider attestation and co-signature Inter-disciplinary care team collaboration (see longitudinal plan of care) Provided education to patient about basic DM disease process including complications that may occur for uncontrolled DM Review of patient status, including review of consultant's reports, relevant laboratory and other test results, and medications completed. Reviewed medications with patient and discussed importance of medication adherence Answered patient's questions pertaining to Metformin including dosage, frequency and indication for use; Determined patient is reluctant to start medication due to fear of SE Sent referral to embedded Pharm D [name] with request to assist patient with questions regarding Metformin as well as holistic therapies Mrs. Booton is considering  Educated patient on dietary and exercise recommendations; daily glycemic control FBS 80-130, <180 after meals;15'15' rule Determined patient does not have a glucometer and has knowledge deficit on how to self monitor her  blood glucose levels Collaborated with PCP provider [name] FNP with request for Rx for a glucometer kit to be sent to patient's preferred pharmacy  Advised patient accordingly regarding how to receive instructional use of her glucometer once received  Advised patient, providing education and rationale, to check cbg daily before meals and at bedtime and record, calling the CCM team and or PCP for findings outside established parameters Reviewed scheduled/upcoming provider appointments including: next PCP follow up appointment scheduled for 09/11/21 $RemoveBefo'@2'YllmriViRyR$ :00 PM; next follow up with Dr. Felecia Shelling, Neurology scheduled for 06/27/21 $RemoveBefo'@3'qpbdBuVzREH$ :30 PM  Mailed printed educational materials related to Diabetes Management  Discussed plans with patient for ongoing care management follow up and provided patient with direct contact information for care management team 06/05/21 Case Collaboration  Received secure message from embedded BSW Daneen Schick stating Mrs. Hardrick has not picked up her glucometer from the pharmacy due to cost Discussed patient informed Tillie Rung, she has started Metformin but believes it is making her tired Sent in basket message marked high priority to embedded Pharm D [name] asking her to follow up with Mrs. Leuthold this week to offer assistance with obtaining her glucometer Informed Coralyn Helling, Mrs. Rhymes has a new DM diagnosis with an A1c of 11.4 1 month ago  Received reply back from [place] stating she will follow with Mrs. Templeman to further assist Self-Care Activities Self administers oral medications as prescribed Attends all scheduled provider  appointments Checks blood sugars as prescribed and utilize hyper and hypoglycemia protocol as needed Adheres to prescribed ADA/carb modified Patient Goals: - check blood sugar at prescribed times - check blood sugar if I feel it is too high or too low - take the blood sugar meter to all doctor visits - drink 6 to 8 glasses of water  each day - fill half of plate with vegetables - manage portion size - keep appointment with eye doctor - schedule appointment with eye doctor - check feet daily for cuts, sores or redness - do heel pump exercise 2 to 3 times each day - keep feet up while sitting - trim toenails straight across - wash and dry feet carefully every day - wear comfortable, cotton socks - wear comfortable, well-fitting shoes  Follow Up Plan: Telephone follow up appointment with care management team member scheduled for: 06/19/21    Plan:Telephone follow up appointment with care management team member scheduled for:  06/19/21  Barb Merino, RN, BSN, CCM Care Management Coordinator [place] [facility]  Direct Phone: [phone]

## 2021-06-13 ENCOUNTER — Other Ambulatory Visit: Payer: Self-pay

## 2021-06-13 DIAGNOSIS — N181 Chronic kidney disease, stage 1: Secondary | ICD-10-CM

## 2021-06-13 MED ORDER — METFORMIN HCL 500 MG PO TABS: 500.0000 mg | ORAL_TABLET | Freq: Two times a day (BID) | ORAL | 1 refills | Status: DC

## 2021-06-14 ENCOUNTER — Other Ambulatory Visit: Payer: Self-pay | Admitting: Nurse Practitioner

## 2021-06-14 DIAGNOSIS — E0822 Diabetes mellitus due to underlying condition with diabetic chronic kidney disease: Secondary | ICD-10-CM | POA: Diagnosis not present

## 2021-06-14 DIAGNOSIS — N181 Chronic kidney disease, stage 1: Secondary | ICD-10-CM | POA: Diagnosis not present

## 2021-06-14 MED ORDER — ATORVASTATIN CALCIUM 10 MG PO TABS: ORAL_TABLET | ORAL | 1 refills | Status: DC

## 2021-06-17 ENCOUNTER — Ambulatory Visit
Admission: RE | Admit: 2021-06-17 | Discharge: 2021-06-17 | Disposition: A | Discharge status: Home / Self Care | Payer: Medicare Other | Source: Ambulatory Visit | Attending: Neurology | Admitting: Neurology

## 2021-06-17 ENCOUNTER — Other Ambulatory Visit: Payer: Self-pay

## 2021-06-17 DIAGNOSIS — G35 Multiple sclerosis: Secondary | ICD-10-CM | POA: Diagnosis not present

## 2021-06-17 DIAGNOSIS — R269 Unspecified abnormalities of gait and mobility: Secondary | ICD-10-CM

## 2021-06-19 ENCOUNTER — Ambulatory Visit (INDEPENDENT_AMBULATORY_CARE_PROVIDER_SITE_OTHER): Payer: BC Managed Care – PPO

## 2021-06-19 ENCOUNTER — Telehealth: Payer: Self-pay

## 2021-06-19 ENCOUNTER — Telehealth: Payer: BC Managed Care – PPO

## 2021-06-19 DIAGNOSIS — E782 Mixed hyperlipidemia: Secondary | ICD-10-CM

## 2021-06-19 DIAGNOSIS — G35D Multiple sclerosis, unspecified: Secondary | ICD-10-CM

## 2021-06-19 DIAGNOSIS — G35 Multiple sclerosis: Secondary | ICD-10-CM

## 2021-06-19 DIAGNOSIS — E0822 Diabetes mellitus due to underlying condition with diabetic chronic kidney disease: Secondary | ICD-10-CM

## 2021-06-19 DIAGNOSIS — N181 Chronic kidney disease, stage 1: Secondary | ICD-10-CM

## 2021-06-19 NOTE — Chronic Care Management (AMB) (Signed)
    Chronic Care Management Pharmacy Assistant   Name: [name]  MRN: [id number] DOB: 08-25-1966  Reason for Encounter: Coordination of Enhanced Pharmacy Services   06/19/2021- Called patient to verify last prescription, per pharmacy patient last received fill 05/07/2021 for a 90 day supply, but when I spoke with patient she mentioned having only 2 bottles from 05/21/2021. After speaking with patient and her verifying through picture message, patient filled Metformin 500 mg bid on 05/07/2021 for 90 DS. Patient will start with [facility] 07/31/2021. Patient aware pharmacy will call prior to delivery. Patient also mentioned that she has not received her meter yet, checking with CMA to see if she was able to send meter to a medical supply store for coverage, awaiting reply.  06/20/2021- Received notification that glucometer was not sent yet. Sent request through [facility] for [facility], strips, lancets and supplies with [employer] [employer]. Fax sent to PCP office for signature from [name], FNP to process and have supplies shipped to patient.   06/28/2021- Patient's Diabetes supplies was delivered on 06/24/2021. [name], Pharm D notified.   Medications: Outpatient Encounter Medications as of 06/19/2021  Medication Sig Note   atorvastatin (LIPITOR) 10 MG tablet Take 1 tablet by mouth at bedtime Mon - Fri    Blood Glucose Monitoring Suppl (FREESTYLE LITE) w/Device KIT Inject 1 each into the skin 2 (two) times daily. dxE11.65    Cholecalciferol (VITAMIN D3) 50 MCG (2000 UT) TABS TAKE 1 TABLET BY MOUTH EVERY DAY OR AS DIRECTED    [facility]S B12 QUICK DISSOLVE 500 MCG LOZG TAKE 2 TABS BY MOUTH DAILY    Dimethyl Fumarate 240 MG CPDR Take 240 mg by mouth 2 (two) times daily. 11/06/2020: CaseId:[id number];Status:Approved;Review Type:Prior Auth;Coverage Start Date:10/07/2020;Coverage End Date:11/06/2021;   doxycycline (VIBRAMYCIN) 100 MG capsule Take 1 capsule (100 mg  total) by mouth 2 (two) times daily. (Patient not taking: Reported on 05/21/2021)    glucose blood (FREESTYLE LITE) test strip Use as instructed to check blood sugars 2 times per day dx:e11.65    ketoconazole (NIZORAL) 2 % cream Apply 1 application topically daily.    metFORMIN (GLUCOPHAGE) 500 MG tablet Take 1 tablet (500 mg total) by mouth 2 (two) times daily with a meal.    Microlet Lancets MISC Use as directed to check blood sugars 2 times per day dx: e11.65    nystatin powder Apply 1 application topically 3 (three) times daily.    Probiotic Product (PROBIOTIC PO) Take 1 Dose by mouth daily. (Patient not taking: Reported on 05/21/2021)    TURMERIC-GINGER PO Take 2 each by mouth daily. Gummies    UNABLE TO FIND Med Name: Terrence Dupont take 2 tablets daily    No facility-administered encounter medications on file as of 06/19/2021.    [name], [place] Pharmacist Assistant [phone]

## 2021-06-20 ENCOUNTER — Other Ambulatory Visit: Payer: Self-pay | Admitting: Nurse Practitioner

## 2021-06-24 DIAGNOSIS — E1122 Type 2 diabetes mellitus with diabetic chronic kidney disease: Secondary | ICD-10-CM | POA: Diagnosis not present

## 2021-06-25 ENCOUNTER — Telehealth: Payer: Self-pay | Admitting: Neurology

## 2021-06-25 ENCOUNTER — Telehealth: Payer: Self-pay

## 2021-06-25 NOTE — Telephone Encounter (Signed)
..   Pt understands that although there may be some limitations with this type of visit, we will take all precautions to reduce any security or privacy concerns.  Pt understands that this will be treated like an in office visit and we will file with pt's insurance, and there may be a patient responsible charge related to this service. ? ?

## 2021-06-25 NOTE — Chronic Care Management (AMB) (Signed)
  [name] was reminded to have all medications, supplements and any blood glucose and blood pressure readings available for review with [name], Pharm. D, at her telephone visit on 06-26-2021 at 3:00.   Questions: Have you had any recent office visit or specialist visit outside of [place]? Patient stated no  Are there any concerns you would like to discuss during your office visit? Patient stated no concerns  Are you having any problems obtaining your medications? (Whether it pharmacy issues or cost) Patient stated she hasn't received glucose monitor yet.  If patient has any PAP medications ask if they are having any problems getting their PAP medication or refill? No PAP medications  Care Gaps: TDAP overdue SHINGRIX overdue PAP smear overdue Colonoscopy overdue Mammogram overdue Influenza Vaccine overdue  Star Rating Drug: Metformin 500 mg- Last filled 05-07-2021 90 DS [facility]  Any gaps in medications fill history? No  [place] Pharmacist Assistant [phone]

## 2021-06-26 ENCOUNTER — Ambulatory Visit: Payer: BC Managed Care – PPO

## 2021-06-26 DIAGNOSIS — N181 Chronic kidney disease, stage 1: Secondary | ICD-10-CM

## 2021-06-26 DIAGNOSIS — G35 Multiple sclerosis: Secondary | ICD-10-CM

## 2021-06-26 NOTE — Progress Notes (Signed)
Chronic Care Management Pharmacy Note  07/02/2021 Name:  [name] MRN:  [id number] DOB:  [birth year]  Summary: Patient reports that she is doing okay.   Recommendations/Changes made from today's visit: Recommend patient contact the [place] transport for appointments. Recommend patient be started on statin medication.   Plan: Patient started on Atorvastatin 10 mg tablet Monday through Friday.    Subjective: [name] is an 55 y.o. year old female who is a primary patient of [name], [place].  The CCM team was consulted for assistance with disease management and care coordination needs.    Engaged with patient by telephone for follow up visit in response to provider referral for pharmacy case management and/or care coordination services. Patient reports that she does not go places when it is raining, and she needs help with closing the door, she walks to the car with her walker, and lifting her legs in and lifting them out. She also reports that it can not be a high car. She would prefer a person and not a van. Patient reports receiving vaccination via [facility]. Patient is a part of [place], that focuses on natural products. She was told about GC control, and she reports they have nice supplements and baby supplements.  Consent to Services:  The patient was given information about Chronic Care Management services, agreed to services, and gave verbal consent prior to initiation of services.  Please see initial visit note for detailed documentation.   Patient Care Team: [name], FNP as PCP - General (General Practice) Lynne Logan, RN as Case Manager Daneen Schick as Social Worker Mayford Knife, [facility] (Pharmacist)  Recent office visits: 05/01/2021 PCP [facility] visits: None in previous 6 months   Objective:  Lab Results  Component Value Date   CREATININE 0.71 05/01/2021   BUN 11 05/01/2021   GFRNONAA 84 02/20/2020   GFRAA 97  02/20/2020   NA 137 05/01/2021   K 4.4 05/01/2021   CALCIUM 9.7 05/01/2021   CO2 21 05/01/2021   GLUCOSE 443 (H) 05/01/2021    Lab Results  Component Value Date/Time   HGBA1C 11.8 (H) 05/01/2021 04:38 PM    Last diabetic Eye exam: No results found for: HMDIABEYEEXA  Last diabetic Foot exam: No results found for: HMDIABFOOTEX   No results found for: CHOL, HDL, LDLCALC, LDLDIRECT, TRIG, CHOLHDL  Hepatic Function Latest Ref Rng & Units 05/01/2021 02/20/2020 06/15/2019  Total Protein 6.0 - 8.5 g/dL 7.2 7.6 6.9  Albumin 3.8 - 4.9 g/dL 4.4 4.3 4.3  AST 0 - 40 IU/L $Remov'13 17 14  'SJTYtl$ ALT 0 - 32 IU/L $Remov'27 23 20  'GzRhjz$ Alk Phosphatase 44 - 121 IU/L 80 69 60  Total Bilirubin 0.0 - 1.2 mg/dL 0.3 0.3 0.3  Bilirubin, Direct 0.00 - 0.40 mg/dL - - -    Lab Results  Component Value Date/Time   TSH 4.120 08/02/2020 04:44 PM   TSH 4.53 03/25/2020 12:00 AM    CBC Latest Ref Rng & Units 05/01/2021 08/02/2020 06/15/2019  WBC 3.4 - 10.8 x10E3/uL 8.0 9.7 8.2  Hemoglobin 11.1 - 15.9 g/dL 14.0 15.4 13.5  Hematocrit 34.0 - 46.6 % 41.5 45.8 39.8  Platelets 150 - 450 x10E3/uL 291 310 277    Lab Results  Component Value Date/Time   VD25OH 52.2 06/15/2019 04:31 PM   VD25OH 48.5 10/23/2015 10:15 AM    Clinical ASCVD:  unknown recent labs  The ASCVD Risk score (Arnett DK, et al.,  2019) failed to calculate for the following reasons:   Cannot find a previous HDL lab   Cannot find a previous total cholesterol lab    Depression screen [facility] 2/9 08/29/2020 02/20/2020 06/13/2019  Decreased Interest 0 0 0  Down, Depressed, Hopeless 0 0 0  PHQ - 2 Score 0 0 0     Social History   Tobacco Use  Smoking Status Never  Smokeless Tobacco Never   BP Readings from Last 3 Encounters:  05/01/21 132/80  09/09/20 140/73  08/02/20 139/90   Pulse Readings from Last 3 Encounters:  05/01/21 (!) 108  09/09/20 (!) 113  08/02/20 92   Wt Readings from Last 3 Encounters:  09/18/20 290 lb (131.5 kg)  08/29/20 290 lb (131.5 kg)   08/29/20 290 lb (131.5 kg)   BMI Readings from Last 3 Encounters:  09/18/20 46.81 kg/m  08/29/20 45.42 kg/m  08/29/20 45.42 kg/m    Assessment/Interventions: Review of patient past medical history, allergies, medications, health status, including review of consultants reports, laboratory and other test data, was performed as part of comprehensive evaluation and provision of chronic care management services.   SDOH:  (Social Determinants of Health) assessments and interventions performed: No  SDOH Screenings   Alcohol Screen: Not on file  Depression (PHQ2-9): Low Risk    PHQ-2 Score: 0  Financial Resource Strain: Low Risk    Difficulty of Paying Living Expenses: Not very hard  Food Insecurity: No Food Insecurity   Worried About Charity fundraiser in the Last Year: Never true   Ran Out of Food in the Last Year: Never true  Housing: Low Risk    Last Housing Risk Score: 0  Physical Activity: Inactive   Days of Exercise per Week: 0 days   Minutes of Exercise per Session: 0 min  Social Connections: Not on file  Stress: No Stress Concern Present   Feeling of Stress : Not at all  Tobacco Use: Low Risk    Smoking Tobacco Use: Never   Smokeless Tobacco Use: Never  Transportation Needs: Unmet Transportation Needs   Lack of Transportation (Medical): Yes   Lack of Transportation (Non-Medical): Yes    CCM Care Plan  Allergies  Allergen Reactions   Food Other (See Comments)    WHAT FOOD CAUSES THIS REACTION? Eyes swelling and throat swelling   Oxycodone Hcl Hives   Soy Allergy     Other reaction(s): SWELLING    Medications Reviewed Today     Reviewed by Britt Bottom, MD (Physician) on 06/27/21 at 1551  Med List Status: <None>   Medication Order Taking? Sig Documenting Provider Last Dose Status Informant  atorvastatin (LIPITOR) 10 MG tablet [id number]  Take 1 tablet by mouth at bedtime Mon - Dorisann Frames, FNP  Active   Blood Glucose Monitoring Suppl (FREESTYLE  LITE) w/Device KIT [id number]  Inject 1 each into the skin 2 (two) times daily. dxE11.65 [name], NP  Active   Cholecalciferol (VITAMIN D3) 50 MCG (2000 UT) TABS [id number] No TAKE 1 TABLET BY MOUTH EVERY DAY OR AS DIRECTED Sater, Nanine Means, MD Taking Active   [facility] B12 QUICK DISSOLVE 500 MCG LOZG [id number] No TAKE 2 TABS BY MOUTH DAILY Sater, Nanine Means, MD Taking Active   Dimethyl Fumarate 240 MG CPDR [id number] No Take 240 mg by mouth 2 (two) times daily. [provider] Taking Active            Med Note Ronnald Ramp, [place]  Nov 06, 2020 11:42 AM) [id number];FRTMYT:RZNBVAPO;Review Type:Prior Auth;Coverage Start Date:10/07/2020;Coverage End Date:11/06/2021;  doxycycline (VIBRAMYCIN) 100 MG capsule [id number] No Take 1 capsule (100 mg total) by mouth 2 (two) times daily.  Patient not taking: Reported on 05/21/2021   [name], NP Not Taking Active   glucose blood (FREESTYLE LITE) test strip [id number]  Use as instructed to check blood sugars 2 times per day dx:e11.65 Ghumman, Ramandeep, NP  Active   ketoconazole (NIZORAL) 2 % cream [id number] No Apply 1 application topically daily. [name], NP Taking Active   metFORMIN (GLUCOPHAGE) 500 MG tablet [id number]  Take 1 tablet (500 mg total) by mouth 2 (two) times daily with a meal. [name], FNP  Active   Microlet Lancets MISC [id number]  Use as directed to check blood sugars 2 times per day dx: e11.65 [name], NP  Active   nystatin powder [id number] No Apply 1 application topically 3 (three) times daily. [name], NP Taking Active   Probiotic Product (PROBIOTIC PO) [id number] No Take 1 Dose by mouth daily.  Patient not taking: Reported on 05/21/2021   [provider] Not Taking Active   TURMERIC-GINGER PO [id number] No Take 2 each by mouth daily. Gummies [provider] Taking Active   UNABLE TO FIND [id number] No Med Name: Terrence Dupont take 2 tablets daily [provider] Taking  Active             Patient Active Problem List   Diagnosis Date Noted   Edema 08/29/2020   Lymphedema 08/02/2020   Stress 08/02/2020   Gait abnormality 08/02/2020   Acute urinary tract infection 10/28/2018   High risk medication use 10/21/2016   Urinary frequency 04/22/2016   Multiple sclerosis ([place]) 02/01/2015   Gait disturbance 02/01/2015   Left foot drop 02/01/2015   Other fatigue 02/01/2015   DS (disseminated sclerosis) ([place]) 01/06/2013   Avitaminosis D 01/06/2013    Immunization History  Administered Date(s) Administered   Influenza,inj,Quad PF,6+ Mos 06/21/2019   PFIZER Comirnaty(Gray Top)Covid-19 Tri-Sucrose Vaccine 03/25/2021   PFIZER(Purple Top)SARS-COV-2 Vaccination 02/02/2020, 03/13/2020, 09/25/2020    Conditions to be addressed/monitored:  Hyperlipidemia and Diabetes  Care Plan : [place]  Updates made by Mayford Knife, [place] since 07/02/2021 12:00 AM     Problem: DM, MS, Immunization status      Long-Range Goal: Disease Management   This Visit's Progress: On track  Recent Progress: On track  Note:     Current Barriers:  Unable to independently monitor therapeutic efficacy Unable to achieve control of glucose.  Does not adhere to prescribed medication regimen Does not maintain contact with provider office Does not contact provider office for questions/concerns  Pharmacist Clinical Goal(s):  Patient will achieve adherence to monitoring guidelines and medication adherence to achieve therapeutic efficacy contact provider office for questions/concerns as evidenced notation of same in electronic health record through collaboration with PharmD and provider.   Interventions: 1:1 collaboration with [name], FNP regarding development and update of comprehensive plan of care as evidenced by provider attestation and co-signature Inter-disciplinary care team collaboration (see longitudinal plan of care) Comprehensive medication  review performed; medication list updated in electronic medical record   Query Diabetes (A1c goal <7%) -Controlled -Current medications: Metformin 500 mg tablet twice per day  -Medications previously tried: none noted  -Current home glucose readings fasting glucose: 114 -Denies hypoglycemic/hyperglycemic symptoms -Current meal patterns:  breakfast: patient reports that she is not eating lunch.  lunch: lettuce wrap, without bread   dinner:  lettuce wrap, without bread   snacks: she is not eating snacks through out the day  drinks: water - 3 or 4 cups of water more then 8 ounces -Current exercise: patient is going to start physical therapy and she will be doing it two days per week, she is going to pay out of pocket. She reports the physical therapist is in training, and she will be doing it until the end of the year.  -Educated on A1c and blood sugar goals; -Counseled to check feet daily and get yearly eye exams -We discussed in detail what guideline recommendations would be for a patient with an A1C of 11. We also discussed that this number means her BS is elevated through out the day.  -At this time patient would like to try to focus on dietary lifestyle changes.  -Collaborated with PCP to have Type II Diabetes added to patients problem list.  -Due to patients elevated  -Recommended to continue current medication  Multiple Sclerosis (Goal: reduce signs and symptoms of MS) -Controlled -Current treatment  Dimethyl Fumarate 240 MG CPDR - take 2 by mouth daily  -Continue to follow up with specialist  -Recommended to continue current medication  Patient Goals/Self-Care Activities Patient will:  - take medications as prescribed  Follow Up Plan: The patient has been provided with contact information for the care management team and has been advised to call with any health related questions or concerns.         Medication Assistance: None required.  Patient affirms current coverage  meets needs.  Compliance/Adherence/Medication fill history: Care Gaps: Urine Microalbumin TDAP PAP Smear Colonoscopy - has not had done yet Mammogram - has not had done yet Influenza Vaccine   Star-Rating Drugs: Atorvastatin 10 mg tablet Metformin 500 mg tablet  Patient's preferred pharmacy is:  [id number] [address], [place]. [place] [address] Phone: [phone] Fax: [phone]  [place], [place] [address] Phone: [phone] Fax: [phone]  [facility] - [place], [place] - [address] [place] [address] Phone: [phone] Fax: [phone]  Uses pill box? No - patient is going to start  Pt endorses 80% compliance  We discussed: Benefits of medication synchronization, packaging and delivery as well as enhanced pharmacist oversight with [facility]. Patient decided to: Utilize [facility] for medication synchronization, packaging and delivery  Care Plan and Follow Up Patient Decision:  Patient agrees to Care Plan and Follow-up.  Plan: The patient has been provided with contact information for the care management team and has been advised to call with any health related questions or concerns.   [name], PharmD Clinical Pharmacist [facility] [phone]

## 2021-06-27 ENCOUNTER — Telehealth (INDEPENDENT_AMBULATORY_CARE_PROVIDER_SITE_OTHER): Payer: BC Managed Care – PPO | Admitting: Neurology

## 2021-06-27 ENCOUNTER — Encounter: Payer: Self-pay | Admitting: Neurology

## 2021-06-27 DIAGNOSIS — Z79899 Other long term (current) drug therapy: Secondary | ICD-10-CM | POA: Diagnosis not present

## 2021-06-27 DIAGNOSIS — R269 Unspecified abnormalities of gait and mobility: Secondary | ICD-10-CM

## 2021-06-27 DIAGNOSIS — G35 Multiple sclerosis: Secondary | ICD-10-CM

## 2021-06-27 DIAGNOSIS — R5383 Other fatigue: Secondary | ICD-10-CM

## 2021-06-27 DIAGNOSIS — I890 Lymphedema, not elsewhere classified: Secondary | ICD-10-CM

## 2021-06-27 NOTE — Progress Notes (Signed)
[facility]  PATIENT: [name] DOB: 02-11-1966  REFERRING CLINICIAN: Andi Devon HISTORY FROM: Patient and mother REASON FOR VISIT: Multiple sclerosis   HISTORICAL  CHIEF COMPLAINT:  No chief complaint on file.   HISTORY OF PRESENT ILLNESS:  [name] is a 55 y.o. woman with relapsing remitting MS diagnosed in 1997.    Virtual Visit via Video Note I connected with [name] on 06/27/21 at  3:30 PM EDT by a video enabled telemedicine application and verified that I am speaking with the correct person.  I discussed the limitations of evaluation and management by telemedicine and the availability of in person appointments. The patient expressed understanding and agreed to proceed.  Due to technical issues we switched to phone for most of the visit  Patient was in her home.  Provider was at the office.  History of Present Illness: Update 07/06/2020: She is on Tecfidera and tolerates it well.   She feels stable on Tecfidera 240 mg bid     Labs 05/01/2021 showed 1.4 lymphocyte count.   She feels her MS has been stable.  She has no exacerbations or new symptoms.   Gait is the same.   She uses a walker and can go 150 feet, about the same as earlier this year.  . She has bilateral leg weakness.  She has some mild spasticity. She denies any weakness in her hands.   She has ankle edema and is  wearing compression stockings.     She has had no falls in last 6 months while transferring to the shower.     Bladder is doing well.    Vision is fine.     She has fatigue worse since Covid (09/15/2020) though better than last visit.  She sleeps well most well.  She takes occasional naps.    She is eating well.   She takes vitamin D supplements.  She denies depression or anxiety.  Cognition is doing well.    Exam: She is a well-developed well-nourished woman in no acute distress.  The head is normocephalic and atraumatic.  Sclera are anicteric.  Visible skin  appears normal.      She is alert and fully oriented with fluent speech and good attention, knowledge and memory.  Extraocular muscles are intact.  Facial strength is normal.  She appears to have normal strength in the arms.  Rapid alternating movements and finger-nose-finger are performed well.   MS History:    In 1997, she presented with backache, poor gait and visual blurring. She had an MRI and a lumbar puncture. The studies were consistent with multiple sclerosis.  She turned down a disease modifying therapy at that time. Her legs and her walking got completely better and she was back at baseline after the steroids. Over the next 15 years or so she had no additional symptoms. She had worsening symptoms in the left leg that was affecting her balance. MRI 2-3 years ago showed progression. She opted not to go on medication at that time.   She started Tecfidera around 2014   Images: MRI Brain 06/17/2021 T2/FLAIR hyperintense foci in the brainstem and hemispheres in a pattern consistent with chronic demyelinating plaque associated with multiple sclerosis.  None of the foci appear to be acute.  Compared to the MRI dated 06/17/2021, there are no new lesions.   Mild cortical and corpus callosum atrophy.  MRI cervical spine 06/17/2021 Multiple T2 hyperintense foci within the spinal cord in a pattern  consistent with chronic demyelinating plaque associated with multiple sclerosis.  Compared to the MRI from 06/06/2015, allowing for differences in technique, there do not appear to be any new lesions.       At C3-C4, there are degenerative changes causing borderline spinal stenosis and moderately severe right foraminal narrowing with potential for right C4 nerve root compression.   At C5-C6, there are degenerative changes causing mild spinal stenosis and moderate right foraminal narrowing but no nerve root compression.   Milder degenerative changes at C4-C5, and C6-C7 that do not cause spinal stenosis or nerve root  compression.  MRI Brain 06/22/2017 shows T2/FLAIR hyperintense foci in the cerebral hemispheres and brainstem in a pattern and configuration consistent with chronic demyelinating plaque associated with multiple sclerosis. None of the foci appears to be acute. When compared to the MRI dated 06/06/2015, there is no interval change.     There is a normal enhancement pattern and there are no acute findings.   MRI cervical spine 06/22/2017 showed several lesions within the spinal cord adjacent to C2, C2-C3, C4 and C7 as described above. None of these enhance after contrast was administered. When compared to the MRI dated 06/06/2015, there has been no definite interval change.     Multilevel degenerative changes as detailed above. There is mild spinal stenosis at C3-C4.  There is moderate right foraminal narrowing at C3-C4 and at C5-C6. The degenerative changes encroach upon the right C4 and C6 nerve roots though there is no definite nerve root compression.    Compared to the MRI dated 06/06/2015, there is no definite interval change.    There is a normal enhancement pattern.  MRI thoracic spine 06/22/2017 showed hyperintense foci within the spinal cord adjacent to C7, T3, T4, T4-T5, T7, T8-T9, T10 and T11 as described above. None of these appear to enhance after contrast was administered. These are consistent with chronic demyelinating plaque associated with multiple sclerosis.    Small disc protrusion at T7-T8 that does not lead to any nerve root compression.   REVIEW OF SYSTEMS:  Constitutional: No fevers, chills, sweats, or change in appetite Eyes: No visual changes, double vision, eye pain Ear, nose and throat: No hearing loss, ear pain, nasal congestion, sore throat Cardiovascular: No chest pain, palpitations Respiratory:  No shortness of breath at rest or with exertion.   No wheezes GastrointestinaI: No nausea, vomiting, diarrhea, abdominal pain, fecal incontinence Genitourinary:  No dysuria, urinary  retention or frequency.  No nocturia. Musculoskeletal:  No neck pain.   She reports some back pain and occasionally proximal leg pain. Integumentary: No rash, pruritus, skin lesions Neurological: as above Psychiatric: She denies depression but notes that she is sometimes moody. There is no anxiety.  She notes some marital stress Endocrine: No palpitations, diaphoresis, change in appetite or increased thirst Hematologic/Lymphatic:  No anemia, purpura, petechiae. Allergic/Immunologic: No itchy/runny eyes, nasal congestion, recent allergic reactions, rashes  ALLERGIES: Allergies  Allergen Reactions   Food Other (See Comments)    WHAT FOOD CAUSES THIS REACTION? Eyes swelling and throat swelling   Oxycodone Hcl Hives   Soy Allergy     Other reaction(s): SWELLING    HOME MEDICATIONS: Outpatient Medications Prior to Visit  Medication Sig Dispense Refill   atorvastatin (LIPITOR) 10 MG tablet Take 1 tablet by mouth at bedtime Mon - Fri 60 tablet 1   Blood Glucose Monitoring Suppl (FREESTYLE LITE) w/Device KIT Inject 1 each into the skin 2 (two) times daily. dxE11.65 1 kit 1  Cholecalciferol (VITAMIN D3) 50 MCG (2000 UT) TABS TAKE 1 TABLET BY MOUTH EVERY DAY OR AS DIRECTED 90 tablet 3   [facility] B12 QUICK DISSOLVE 500 MCG LOZG TAKE 2 TABS BY MOUTH DAILY 180 lozenge 1   Dimethyl Fumarate 240 MG CPDR Take 240 mg by mouth 2 (two) times daily.     doxycycline (VIBRAMYCIN) 100 MG capsule Take 1 capsule (100 mg total) by mouth 2 (two) times daily. (Patient not taking: Reported on 05/21/2021) 14 capsule 0   glucose blood (FREESTYLE LITE) test strip Use as instructed to check blood sugars 2 times per day dx:e11.65 100 each 1   ketoconazole (NIZORAL) 2 % cream Apply 1 application topically daily. 15 g 1   metFORMIN (GLUCOPHAGE) 500 MG tablet Take 1 tablet (500 mg total) by mouth 2 (two) times daily with a meal. 180 tablet 1   Microlet Lancets MISC Use as directed to check blood sugars 2 times per day dx:  e11.65 100 each 11   nystatin powder Apply 1 application topically 3 (three) times daily. 15 g 1   Probiotic Product (PROBIOTIC PO) Take 1 Dose by mouth daily. (Patient not taking: Reported on 05/21/2021)     TURMERIC-GINGER PO Take 2 each by mouth daily. Gummies     UNABLE TO FIND Med Name: Terrence Dupont take 2 tablets daily     No facility-administered medications prior to visit.    PAST MEDICAL HISTORY: Past Medical History:  Diagnosis Date   Movement disorder    Multiple sclerosis ([place])     PAST SURGICAL HISTORY: Past Surgical History:  Procedure Laterality Date   CESAREAN SECTION     UTERINE FIBROID SURGERY     WISDOM TOOTH EXTRACTION      FAMILY HISTORY: Family History  Problem Relation Age of Onset   Hypertension Mother    Hypertension Father    Diabetes Father        DIAGNOSTIC DATA (LABS, IMAGING, TESTING) - I reviewed patient records, labs, notes, testing and imaging myself where available.  Lab Results  Component Value Date   WBC 8.0 05/01/2021   HGB 14.0 05/01/2021   HCT 41.5 05/01/2021   MCV 90 05/01/2021   PLT 291 05/01/2021      ASSESSMENT AND PLAN  Multiple sclerosis ([facility])  Gait abnormality  High risk medication use  Other fatigue  Lymphedema  1.   Continue Tecfidera.  CBC with differential was checked recently with primary care and her lymphocyte count is fine at 1.4.  MRIs did not show any new activity.. 2.   Continue supplementary vitamin D and B12.. 3.   She will use a walker around the house and wheelchair outside the house. 4.   rtc 6 months, sooner if problems    Follow Up Instructions: I discussed the assessment and treatment plan with the patient. The patient was provided an opportunity to ask questions and all were answered. The patient agreed with the plan and demonstrated an understanding of the instructions.    The patient was advised to call back or seek an in-person evaluation if the symptoms worsen or if the condition fails to  improve as anticipated.  I provided 18 minutes of non-face-to-face time during this encounter.   Carol Loftin A. Felecia Shelling, MD, PhD 09/60/4540, 9:81 PM Certified in Neurology, Clinical Neurophysiology, Sleep Medicine, Pain Medicine and Neuroimaging  [facility] [address] [place], [place] [address] [phone]

## 2021-07-02 ENCOUNTER — Other Ambulatory Visit: Payer: Self-pay | Admitting: Nurse Practitioner

## 2021-07-02 DIAGNOSIS — E1165 Type 2 diabetes mellitus with hyperglycemia: Secondary | ICD-10-CM

## 2021-07-02 MED ORDER — DAPAGLIFLOZIN PROPANEDIOL 10 MG PO TABS: 10.0000 mg | ORAL_TABLET | Freq: Every day | ORAL | 2 refills | Status: DC

## 2021-07-02 NOTE — Patient Instructions (Signed)
Visit Information It was great speaking with you today!  Please let me know if you have any questions about our visit.   Goals Addressed             This Visit's Progress    Manage My Medicine       Timeframe:  Long-Range Goal Priority:  High Start Date:                             Expected End Date:                       Follow Up Date 08/2021   - call for medicine refill 2 or 3 days before it runs out - call if I am sick and can't take my medicine - keep a list of all the medicines I take; vitamins and herbals too - use a pillbox to sort medicine - use an alarm clock or phone to remind me to take my medicine    Why is this important?   These steps will help you keep on track with your medicines.   Notes:  Please contact us if you have any questions about your medications         Patient Care Plan: CCM Pharmacy Care Plan     Problem Identified: DM, MS, Immunization status      Long-Range Goal: Disease Management   This Visit's Progress: On track  Recent Progress: On track  Note:     Current Barriers:  Unable to independently monitor therapeutic efficacy Unable to achieve control of glucose.  Does not adhere to prescribed medication regimen Does not maintain contact with provider office Does not contact provider office for questions/concerns  Pharmacist Clinical Goal(s):  Patient will achieve adherence to monitoring guidelines and medication adherence to achieve therapeutic efficacy contact provider office for questions/concerns as evidenced notation of same in electronic health record through collaboration with PharmD and provider.   Interventions: 1:1 collaboration with [name], FNP regarding development and update of comprehensive plan of care as evidenced by provider attestation and co-signature Inter-disciplinary care team collaboration (see longitudinal plan of care) Comprehensive medication review performed; medication list updated in  electronic medical record   Query Diabetes (A1c goal <7%) -Controlled -Current medications: Metformin 500 mg tablet twice per day  -Medications previously tried: none noted  -Current home glucose readings fasting glucose: 114 -Denies hypoglycemic/hyperglycemic symptoms -Current meal patterns:  breakfast: patient reports that she is not eating lunch.  lunch: lettuce wrap, without bread   dinner: lettuce wrap, without bread   snacks: she is not eating snacks through out the day  drinks: water - 3 or 4 cups of water more then 8 ounces -Current exercise: patient is going to start physical therapy and she will be doing it two days per week, she is going to pay out of pocket. She reports the physical therapist is in training, and she will be doing it until the end of the year.  -Educated on A1c and blood sugar goals; -Counseled to check feet daily and get yearly eye exams -We discussed in detail what guideline recommendations would be for a patient with an A1C of 11. We also discussed that this number means her BS is elevated through out the day.  -At this time patient would like to try to focus on dietary lifestyle changes.  -Collaborated with PCP to have Type II Diabetes added to patients problem  list.  -Due to patients elevated  -Recommended to continue current medication  Multiple Sclerosis (Goal: reduce signs and symptoms of MS) -Controlled -Current treatment  Dimethyl Fumarate 240 MG CPDR - take 2 by mouth daily  -Continue to follow up with specialist  -Recommended to continue current medication  Patient Goals/Self-Care Activities Patient will:  - take medications as prescribed  Follow Up Plan: The patient has been provided with contact information for the care management team and has been advised to call with any health related questions or concerns.          Patient agreed to services and verbal consent obtained.   The patient verbalized understanding of instructions,  educational materials, and care plan provided today and agreed to receive a mailed copy of patient instructions, educational materials, and care plan.   [name], PharmD Clinical Pharmacist [facility] [phone]

## 2021-07-03 ENCOUNTER — Ambulatory Visit: Payer: BC Managed Care – PPO

## 2021-07-03 DIAGNOSIS — G35 Multiple sclerosis: Secondary | ICD-10-CM

## 2021-07-03 DIAGNOSIS — N181 Chronic kidney disease, stage 1: Secondary | ICD-10-CM

## 2021-07-03 NOTE — Patient Instructions (Signed)
Social Worker Visit Information  Goals we discussed today:   Goals Addressed             This Visit's Progress    Quality of life maintained   On track    Timeframe:  Long-Range Goal Priority:  Low Start Date:  2.24.22                            Next planned outreach date: 1.19.23  Patient Goals/Self-Care Activities patient will:  - Engage with RN Care Manager to discuss barriers to checking blood glucose levels during next scheduled call -Contact SW as needed prior to next scheduled call         Patient verbalizes understanding of instructions provided today and agrees to view in [place].   Follow Up Plan: SW will follow up with patient by phone over the next 90 days   Daneen Schick, BSW, CDP Social Worker, Certified Dementia Practitioner [place] [facility] [place] [facility] [phone]

## 2021-07-03 NOTE — Chronic Care Management (AMB) (Signed)
Chronic Care Management    Social Work Note  07/03/2021 Name: [name] MRN: [id number] DOB: August 02, 1966  [name] is a 55 y.o. year old female who is a primary care patient of [name], [place]. The CCM team was consulted to assist the patient with chronic disease management and/or care coordination needs related to:  DM II, CKD I, Multiple Sclerosis .   Engaged with patient by telephone for follow up visit in response to provider referral for social work chronic care management and care coordination services.   Consent to Services:  The patient was given information about Chronic Care Management services, agreed to services, and gave verbal consent prior to initiation of services.  Please see initial visit note for detailed documentation.   Patient agreed to services and consent obtained.   Assessment: Review of patient past medical history, allergies, medications, and health status, including review of relevant consultants reports was performed today as part of a comprehensive evaluation and provision of chronic care management and care coordination services.     SDOH (Social Determinants of Health) assessments and interventions performed:  SDOH Interventions    Flowsheet Row Most Recent Value  SDOH Interventions   Food Insecurity Interventions Intervention Not Indicated  Housing Interventions Intervention Not Indicated  Transportation Interventions Intervention Not Indicated        Advanced Directives Status: Not addressed in this encounter.  CCM Care Plan  Allergies  Allergen Reactions   Food Other (See Comments)    WHAT FOOD CAUSES THIS REACTION? Eyes swelling and throat swelling   Oxycodone Hcl Hives   Soy Allergy     Other reaction(s): SWELLING    Outpatient Encounter Medications as of 07/03/2021  Medication Sig Note   atorvastatin (LIPITOR) 10 MG tablet Take 1 tablet by mouth at bedtime Mon - Fri    Blood Glucose Monitoring Suppl (FREESTYLE LITE)  w/Device KIT Inject 1 each into the skin 2 (two) times daily. dxE11.65    Cholecalciferol (VITAMIN D3) 50 MCG (2000 UT) TABS TAKE 1 TABLET BY MOUTH EVERY DAY OR AS DIRECTED    [employer] B12 QUICK DISSOLVE 500 MCG LOZG TAKE 2 TABS BY MOUTH DAILY    dapagliflozin propanediol (FARXIGA) 10 MG TABS tablet Take 1 tablet (10 mg total) by mouth daily before breakfast.    Dimethyl Fumarate 240 MG CPDR Take 240 mg by mouth 2 (two) times daily. 11/06/2020: CaseId:[id number];Status:Approved;Review Type:Prior Auth;Coverage Start Date:10/07/2020;Coverage End Date:11/06/2021;   doxycycline (VIBRAMYCIN) 100 MG capsule Take 1 capsule (100 mg total) by mouth 2 (two) times daily. (Patient not taking: Reported on 05/21/2021)    glucose blood (FREESTYLE LITE) test strip Use as instructed to check blood sugars 2 times per day dx:e11.65    ketoconazole (NIZORAL) 2 % cream Apply 1 application topically daily.    metFORMIN (GLUCOPHAGE) 500 MG tablet Take 1 tablet (500 mg total) by mouth 2 (two) times daily with a meal.    Microlet Lancets MISC Use as directed to check blood sugars 2 times per day dx: e11.65    nystatin powder Apply 1 application topically 3 (three) times daily.    Probiotic Product (PROBIOTIC PO) Take 1 Dose by mouth daily. (Patient not taking: Reported on 05/21/2021)    TURMERIC-GINGER PO Take 2 each by mouth daily. Gummies    UNABLE TO FIND Med Name: Terrence Dupont take 2 tablets daily    No facility-administered encounter medications on file as of 07/03/2021.    Patient Active Problem List   Diagnosis  Date Noted   Edema 08/29/2020   Lymphedema 08/02/2020   Stress 08/02/2020   Gait abnormality 08/02/2020   Acute urinary tract infection 10/28/2018   High risk medication use 10/21/2016   Urinary frequency 04/22/2016   Multiple sclerosis ([place]) 02/01/2015   Gait disturbance 02/01/2015   Left foot drop 02/01/2015   Other fatigue 02/01/2015   DS (disseminated sclerosis) ([place]) 01/06/2013   Avitaminosis D 01/06/2013     Conditions to be addressed/monitored: DMII, CKD Stage I, and Multiple Sclerosis  Care Plan : Social Work Care Plan  Updates made by Daneen Schick since 07/03/2021 12:00 AM     Problem: Quality of Life (General Plan of Care)      Long-Range Goal: Quality of Life Maintained   Start Date: 11/08/2020  Recent Progress: On track  Priority: Low  Note:   Current Barriers:  Chronic disease management support and education needs related to  Multiple Sclerosis and Vitamin B12 Deficiency   ADL IADL limitations  Social Worker Clinical Goal(s):  patient will work with SW to identify and address any acute and/or chronic care coordination needs related to the self health management of  Multiple Sclerosis and Vitamin B12 Deficiency    CCM SW Interventions:  Inter-disciplinary care team collaboration (see longitudinal plan of care) Collaboration with [name], FNP regarding development and update of comprehensive plan of care as evidenced by provider attestation and co-signature Successful outbound call placed to the patient to assess goal progression Confirmed the patient has received her glucometer from [facility] Discussed the patient is not checking her blood sugar daily - patient reports checking her blood sugar requires the use of her small muscles which can be difficult so she waits to check until she can have assistance Determined the patient has attempted to check her blood sugar on 3 separate days but was only successful once due to difficulty obtaining enough blood for an accurate reading Collaboration with [place] to review patient reported barriers  Assessed for patient involvement with mobile meals - patient reports she has chosen to remain eating on her own in the home rather than paying the daily fee to enroll in mobile meals prior to age 50 Discussed the patient tries to have access to snacks and quick items - when her daughter returns home at 4pm she  assists with meal preparation as needed No acute resource needs identified at this time - scheduled follow up call over the next 3 months  Patient Goals/Self-Care Activities patient will:   -  Engage with RN Care Manager to discuss barriers to checking blood glucose levels during next scheduled call -Contact SW as needed prior to next scheduled call  Follow Up Plan:  SW will follow up with the patient over the next 90 days       Follow Up Plan: SW will follow up with patient by phone over the next 90 days      Daneen Schick, BSW, CDP Social Worker, Certified Dementia Practitioner [place] [facility] [place] [facility] [phone]

## 2021-07-05 NOTE — Patient Instructions (Signed)
Visit Information  PATIENT GOALS:  Goals Addressed      Glycemic Management Optimized       Timeframe:  Long-Range Goal Priority:  High Start Date:  05/15/21                           Expected End Date:  05/15/22  Follow up date: 06/19/21   Patient Goals: - obtain glucometer and start checking cbg's ASAP - check blood sugar at prescribed times - check blood sugar if I feel it is too high or too low - take the blood sugar meter to all doctor visits - drink 6 to 8 glasses of water each day - fill half of plate with vegetables - manage portion size                       The patient verbalized understanding of instructions, educational materials, and care plan provided today and declined offer to receive copy of patient instructions, educational materials, and care plan.   Telephone follow up appointment with care management team member scheduled for: 07/16/21  Barb Merino, RN, BSN, CCM Care Management Coordinator [place] [facility]  Direct Phone: [phone]

## 2021-07-05 NOTE — Chronic Care Management (AMB) (Signed)
Chronic Care Management   CCM RN Visit Note  06/19/2021 Name: [name] MRN: [id number] DOB: 1966-05-27  Subjective: [name] is a 55 y.o. year old female who is a primary care patient of [name], [place]. The care management team was consulted for assistance with disease management and care coordination needs.    Engaged with patient by telephone for follow up visit in response to provider referral for case management and/or care coordination services.   Consent to Services:  The patient was given information about Chronic Care Management services, agreed to services, and gave verbal consent prior to initiation of services.  Please see initial visit note for detailed documentation.   Patient agreed to services and verbal consent obtained.   Assessment: Review of patient past medical history, allergies, medications, health status, including review of consultants reports, laboratory and other test data, was performed as part of comprehensive evaluation and provision of chronic care management services.   SDOH (Social Determinants of Health) assessments and interventions performed:    CCM Care Plan  Allergies  Allergen Reactions   Food Other (See Comments)    WHAT FOOD CAUSES THIS REACTION? Eyes swelling and throat swelling   Oxycodone Hcl Hives   Soy Allergy     Other reaction(s): SWELLING    Outpatient Encounter Medications as of 06/19/2021  Medication Sig Note   atorvastatin (LIPITOR) 10 MG tablet Take 1 tablet by mouth at bedtime Mon - Fri    Blood Glucose Monitoring Suppl (FREESTYLE LITE) w/Device KIT Inject 1 each into the skin 2 (two) times daily. dxE11.65    Cholecalciferol (VITAMIN D3) 50 MCG (2000 UT) TABS TAKE 1 TABLET BY MOUTH EVERY DAY OR AS DIRECTED    [employer]S B12 QUICK DISSOLVE 500 MCG LOZG TAKE 2 TABS BY MOUTH DAILY    Dimethyl Fumarate 240 MG CPDR Take 240 mg by mouth 2 (two) times daily. 11/06/2020: CaseId:[id number];Status:Approved;Review Type:Prior  Auth;Coverage Start Date:10/07/2020;Coverage End Date:11/06/2021;   doxycycline (VIBRAMYCIN) 100 MG capsule Take 1 capsule (100 mg total) by mouth 2 (two) times daily. (Patient not taking: Reported on 05/21/2021)    glucose blood (FREESTYLE LITE) test strip Use as instructed to check blood sugars 2 times per day dx:e11.65    ketoconazole (NIZORAL) 2 % cream Apply 1 application topically daily.    metFORMIN (GLUCOPHAGE) 500 MG tablet Take 1 tablet (500 mg total) by mouth 2 (two) times daily with a meal.    Microlet Lancets MISC Use as directed to check blood sugars 2 times per day dx: e11.65    nystatin powder Apply 1 application topically 3 (three) times daily.    Probiotic Product (PROBIOTIC PO) Take 1 Dose by mouth daily. (Patient not taking: Reported on 05/21/2021)    TURMERIC-GINGER PO Take 2 each by mouth daily. Gummies    UNABLE TO FIND Med Name: Terrence Dupont take 2 tablets daily    No facility-administered encounter medications on file as of 06/19/2021.    Patient Active Problem List   Diagnosis Date Noted   Edema 08/29/2020   Lymphedema 08/02/2020   Stress 08/02/2020   Gait abnormality 08/02/2020   Acute urinary tract infection 10/28/2018   High risk medication use 10/21/2016   Urinary frequency 04/22/2016   Multiple sclerosis ([place]) 02/01/2015   Gait disturbance 02/01/2015   Left foot drop 02/01/2015   Other fatigue 02/01/2015   DS (disseminated sclerosis) ([place]) 01/06/2013   Avitaminosis D 01/06/2013    Conditions to be addressed/monitored: Diabetes Mellitus due to underlying  condition with type 1 chronic kidney disease; Multiple Sclerosis, Mixed Hyperlipidemia   Care Plan : Diabetes Type 2 (Adult)  Updates made by Lynne Logan, RN since 06/19/2021 12:00 AM     Problem: Glycemic Management (Diabetes, Type 2)   Priority: High     Long-Range Goal: Glycemic Management Optimized   Start Date: 05/15/2021  Expected End Date: 05/15/2022  Priority: High  Note:   Objective:  Lab  Results  Component Value Date   HGBA1C 11.8 (H) 05/01/2021   Lab Results  Component Value Date   CREATININE 0.71 05/01/2021   CREATININE 0.80 02/20/2020   CREATININE 0.76 06/15/2019   Lab Results  Component Value Date   EGFR 100 05/01/2021   Current Barriers:  Knowledge Deficits related to basic Diabetes pathophysiology and self care/management Knowledge Deficits related to medications used for management of diabetes Impaired Physical Mobility  Case Manager Clinical Goal(s):  patient will demonstrate improved adherence to prescribed treatment plan for diabetes self care/management as evidenced by: daily monitoring and recording of CBG  adherence to ADA/ carb modified diet exercise 5 days/week adherence to prescribed medication regimen contacting provider for new or worsened symptoms or questions Interventions:  06/19/21 completed successful outbound call with patient  Collaboration with [name], [place] regarding development and update of comprehensive plan of care as evidenced by provider attestation and co-signature Inter-disciplinary care team collaboration (see longitudinal plan of care) Re-educated patient about basic DM disease process including complications that may occur for uncontrolled DM Review of patient status, including review of consultant's reports, relevant laboratory and other test results, and medications completed. Reviewed medications with patient and discussed importance of medication adherence, determined patient is taking Metformin as prescribed  Determined patient has yet to receive her glucometer due she states she wasn't able to afford pay for this device Collaborated with the embedded Pharmacy team and PCP regarding assistance is needed to help patient obtain her glucometer ASAP Reply received from the embedded Pharmacy assistant, Felicity Coyer advising patient's Rx for her glucometer has been resubmitted for approval/purchase, she will notify the patient  accordingly  Advised patient, providing education and rationale, to check cbg daily before meals and at bedtime and record, calling the CCM team and or PCP for findings outside established parameters Re-educated on daily glycemic control, FBS 80-130, after meals <180; 15'15' rule Re-educated on the importance to implement routine daily exercise as tolerated per patient's physical limitations Re-educated on the importance to make dietary changes including to use Meal Planning, portion control and increase water to 64 oz daily  Confirmed patient received her printed educational materials related to Diabetes Management, she denies questions at this time   Discussed plans with patient for ongoing care management follow up and provided patient with direct contact information for care management team Self-Care Activities Self administers oral medications as prescribed Attends all scheduled provider appointments Checks blood sugars as prescribed and utilize hyper and hypoglycemia protocol as needed Adheres to prescribed ADA/carb modified Patient Goals: - Obtain glucometer and start checking cbg's ASAP - check blood sugar at prescribed times - check blood sugar if I feel it is too high or too low - take the blood sugar meter to all doctor visits - drink 6 to 8 glasses of water each day - fill half of plate with vegetables - manage portion size   Follow Up Plan: Telephone follow up appointment with care management team member scheduled for: 07/16/21     Plan:Telephone follow up appointment with care management  team member scheduled for:  07/16/21  Barb Merino, RN, BSN, CCM Care Management Coordinator [place] [facility]  Direct Phone: [phone]

## 2021-07-06 IMAGING — US US EXTREM LOW VENOUS
1 series · 13 of 24 positions shown · non-contrast
Comparison: None.

CLINICAL DATA: Bilateral lower extremity edema.  Evaluate for DVT.



[Series 1: us extrem low venous · 0.09mm/px · 13 of 46 slices shown]
[im 1/46]
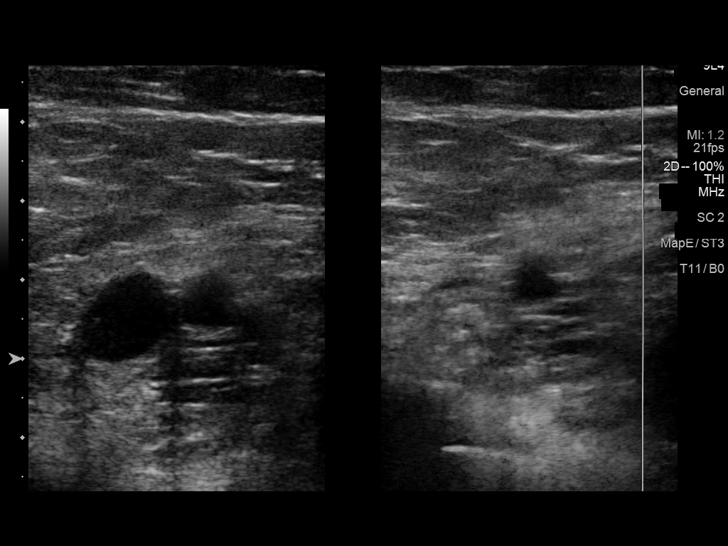
[im 4/46]
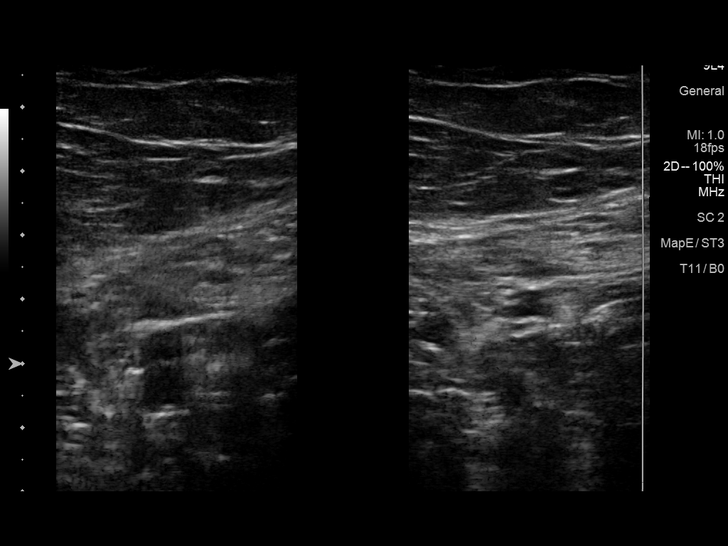
[im 8/46]
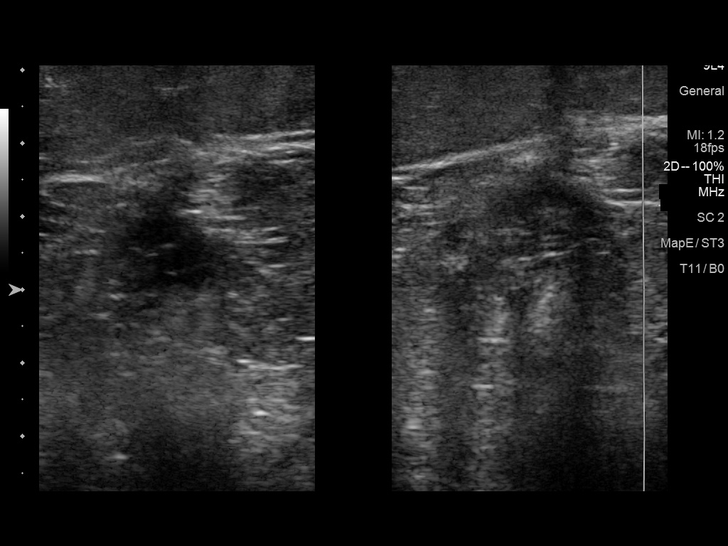
[im 12/46]
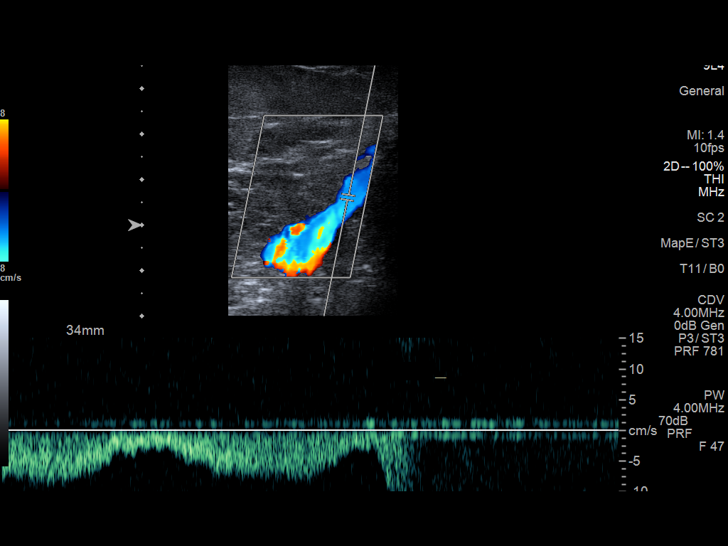
[im 16/46]
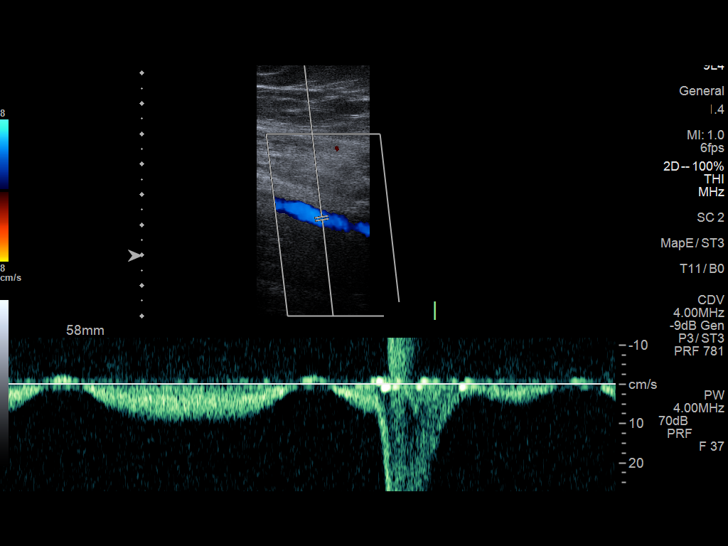
[im 20/46]
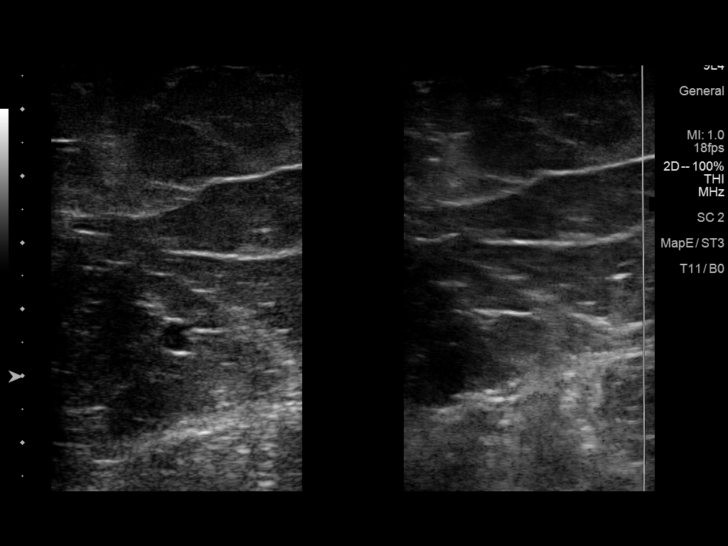
[im 24/46]
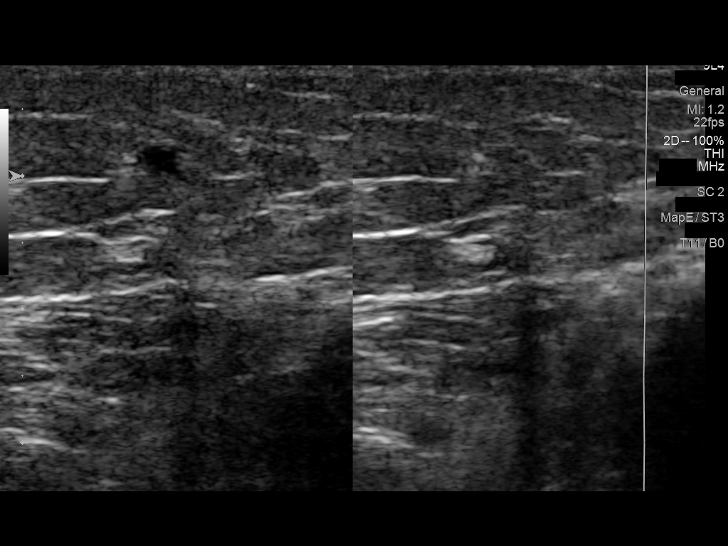
[im 26/46]
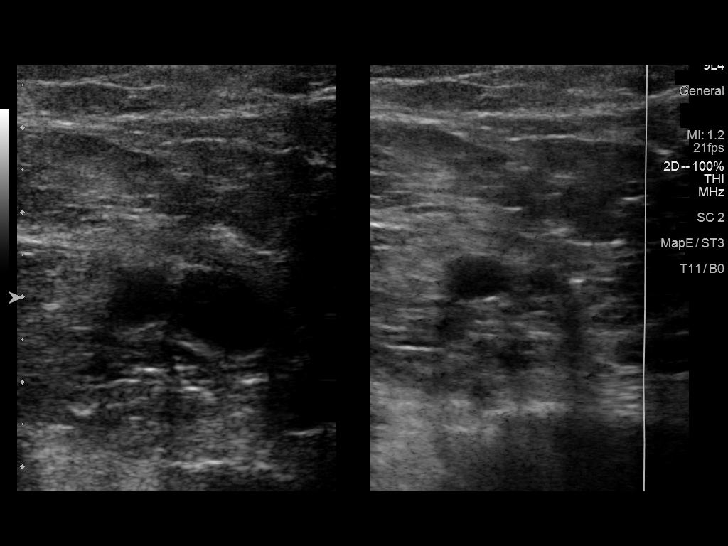
[im 30/46]
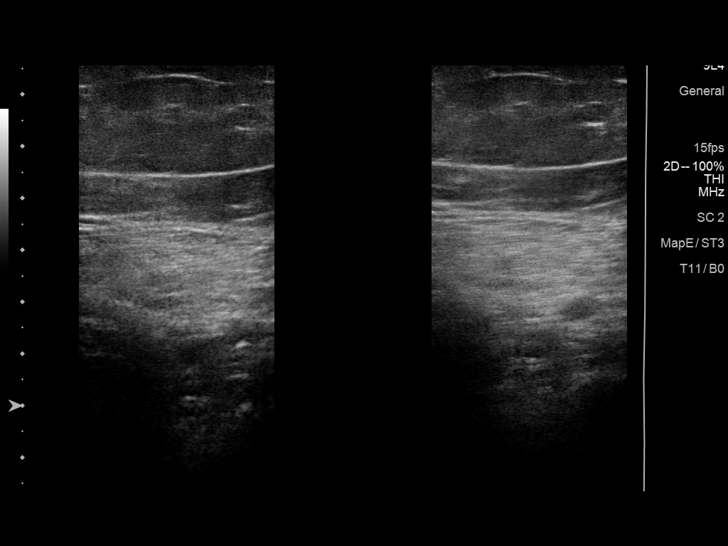
[im 34/46]
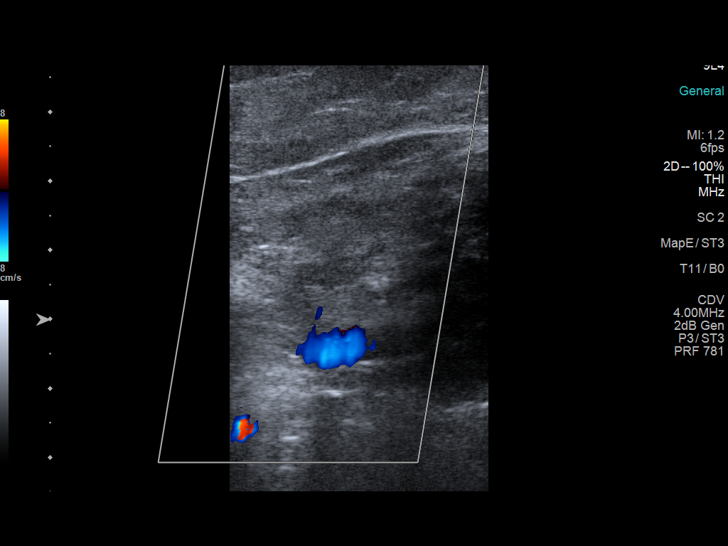
[im 38/46]
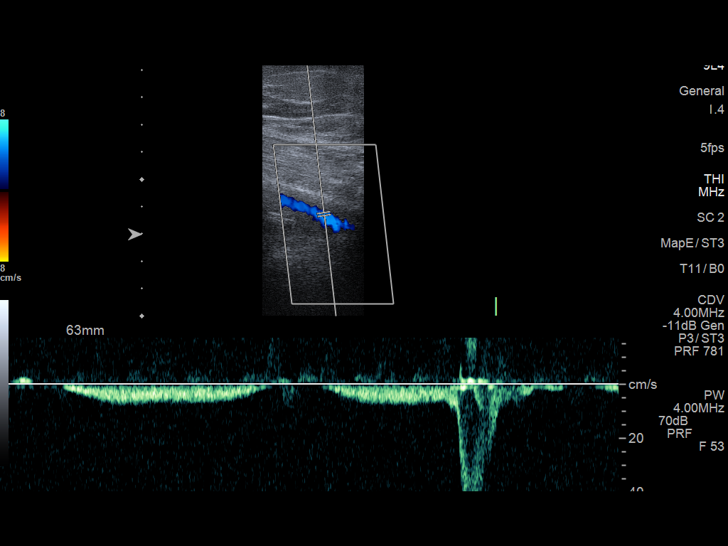
[im 42/46]
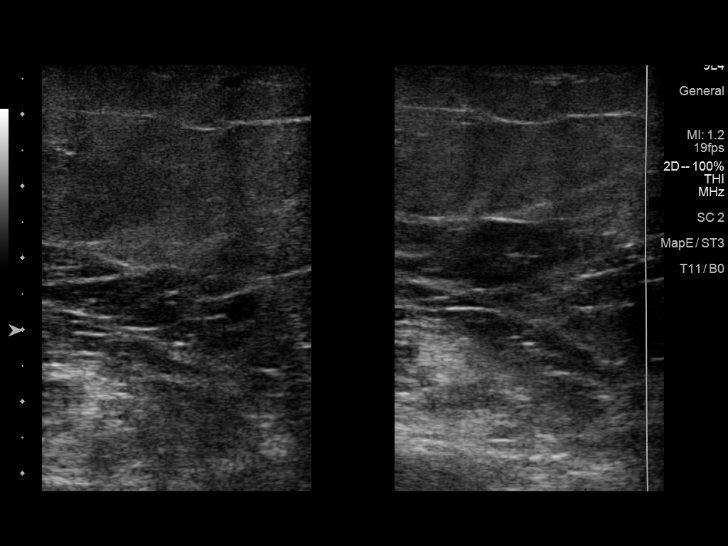
[im 46/46]
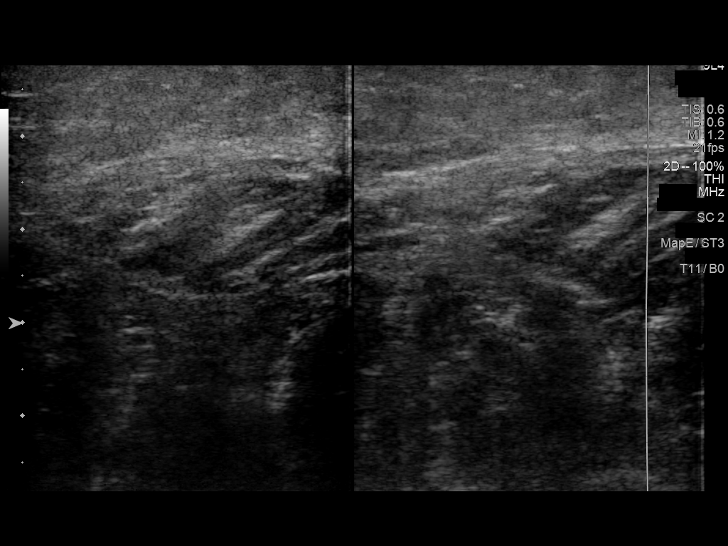

[13 of 24 positions shown; findings below may reference images not displayed]

FINDINGS: RIGHT LOWER EXTREMITY

Common Femoral Vein: No evidence of thrombus. Normal
compressibility, respiratory phasicity and response to augmentation.

Saphenofemoral Junction: No evidence of thrombus. Normal
compressibility and flow on color Doppler imaging.

Profunda Femoral Vein: No evidence of thrombus. Normal
compressibility and flow on color Doppler imaging.

Femoral Vein: No evidence of thrombus. Normal compressibility,
respiratory phasicity and response to augmentation.

Popliteal Vein: No evidence of thrombus. Normal compressibility,
respiratory phasicity and response to augmentation.

Calf Veins: No evidence of thrombus. Normal compressibility and flow
on color Doppler imaging.

Superficial Great Saphenous Vein: No evidence of thrombus. Normal
compressibility.

Venous Reflux:  None.

Other Findings:  None.

LEFT LOWER EXTREMITY

Common Femoral Vein: No evidence of thrombus. Normal
compressibility, respiratory phasicity and response to augmentation.

Saphenofemoral Junction: No evidence of thrombus. Normal
compressibility and flow on color Doppler imaging.

Profunda Femoral Vein: No evidence of thrombus. Normal
compressibility and flow on color Doppler imaging.

Femoral Vein: No evidence of thrombus. Normal compressibility,
respiratory phasicity and response to augmentation.

Popliteal Vein: No evidence of thrombus. Normal compressibility,
respiratory phasicity and response to augmentation.

Calf Veins: No evidence of thrombus. Normal compressibility and flow
on color Doppler imaging.

Superficial Great Saphenous Vein: No evidence of thrombus. Normal
compressibility.

Venous Reflux:  None.

Other Findings: There is no sonographic correlate for patient's area
of erythema involving the left calf (images 11 and 12).
IMPRESSION: 1. No evidence of DVT within either lower extremity.
2. No sonographic correlate for patient's area of erythema involving
the left calf. Specifically, no definable/drainable fluid
collection.

## 2021-07-15 DIAGNOSIS — E0822 Diabetes mellitus due to underlying condition with diabetic chronic kidney disease: Secondary | ICD-10-CM | POA: Diagnosis not present

## 2021-07-15 DIAGNOSIS — E782 Mixed hyperlipidemia: Secondary | ICD-10-CM

## 2021-07-15 DIAGNOSIS — N181 Chronic kidney disease, stage 1: Secondary | ICD-10-CM | POA: Diagnosis not present

## 2021-07-16 ENCOUNTER — Ambulatory Visit (INDEPENDENT_AMBULATORY_CARE_PROVIDER_SITE_OTHER): Payer: BC Managed Care – PPO

## 2021-07-16 DIAGNOSIS — E782 Mixed hyperlipidemia: Secondary | ICD-10-CM

## 2021-07-16 DIAGNOSIS — N181 Chronic kidney disease, stage 1: Secondary | ICD-10-CM

## 2021-07-16 DIAGNOSIS — E0822 Diabetes mellitus due to underlying condition with diabetic chronic kidney disease: Secondary | ICD-10-CM

## 2021-07-16 DIAGNOSIS — G35 Multiple sclerosis: Secondary | ICD-10-CM

## 2021-07-16 DIAGNOSIS — R609 Edema, unspecified: Secondary | ICD-10-CM

## 2021-07-17 NOTE — Chronic Care Management (AMB) (Signed)
Chronic Care Management   CCM RN Visit Note  07/16/2021 Name: [name] MRN: [id number] DOB: 04/11/1966  Subjective: [name] is a 55 y.o. year old female who is a primary care patient of [name], [place]. The care management team was consulted for assistance with disease management and care coordination needs.    Engaged with patient by telephone for follow up visit in response to provider referral for case management and/or care coordination services.   Consent to Services:  The patient was given information about Chronic Care Management services, agreed to services, and gave verbal consent prior to initiation of services.  Please see initial visit note for detailed documentation.   Patient agreed to services and verbal consent obtained.   Assessment: Review of patient past medical history, allergies, medications, health status, including review of consultants reports, laboratory and other test data, was performed as part of comprehensive evaluation and provision of chronic care management services.   SDOH (Social Determinants of Health) assessments and interventions performed:  Yes, no acute challenges   CCM Care Plan  Allergies  Allergen Reactions   Food Other (See Comments)    WHAT FOOD CAUSES THIS REACTION? Eyes swelling and throat swelling   Oxycodone Hcl Hives   Soy Allergy     Other reaction(s): SWELLING    Outpatient Encounter Medications as of 07/16/2021  Medication Sig Note   atorvastatin (LIPITOR) 10 MG tablet Take 1 tablet by mouth at bedtime Mon - Fri    Blood Glucose Monitoring Suppl (FREESTYLE LITE) w/Device KIT Inject 1 each into the skin 2 (two) times daily. dxE11.65    Cholecalciferol (VITAMIN D3) 50 MCG (2000 UT) TABS TAKE 1 TABLET BY MOUTH EVERY DAY OR AS DIRECTED    [facility] B12 QUICK DISSOLVE 500 MCG LOZG TAKE 2 TABS BY MOUTH DAILY    dapagliflozin propanediol (FARXIGA) 10 MG TABS tablet Take 1 tablet (10 mg total) by mouth daily before  breakfast.    Dimethyl Fumarate 240 MG CPDR Take 240 mg by mouth 2 (two) times daily. 11/06/2020: CaseId:[id number];Status:Approved;Review Type:Prior Auth;Coverage Start Date:10/07/2020;Coverage End Date:11/06/2021;   doxycycline (VIBRAMYCIN) 100 MG capsule Take 1 capsule (100 mg total) by mouth 2 (two) times daily. (Patient not taking: Reported on 05/21/2021)    glucose blood (FREESTYLE LITE) test strip Use as instructed to check blood sugars 2 times per day dx:e11.65    ketoconazole (NIZORAL) 2 % cream Apply 1 application topically daily.    metFORMIN (GLUCOPHAGE) 500 MG tablet Take 1 tablet (500 mg total) by mouth 2 (two) times daily with a meal.    Microlet Lancets MISC Use as directed to check blood sugars 2 times per day dx: e11.65    nystatin powder Apply 1 application topically 3 (three) times daily.    Probiotic Product (PROBIOTIC PO) Take 1 Dose by mouth daily. (Patient not taking: Reported on 05/21/2021)    TURMERIC-GINGER PO Take 2 each by mouth daily. Gummies    UNABLE TO FIND Med Name: Terrence Dupont take 2 tablets daily    No facility-administered encounter medications on file as of 07/16/2021.    Patient Active Problem List   Diagnosis Date Noted   Edema 08/29/2020   Lymphedema 08/02/2020   Stress 08/02/2020   Gait abnormality 08/02/2020   Acute urinary tract infection 10/28/2018   High risk medication use 10/21/2016   Urinary frequency 04/22/2016   Multiple sclerosis ([place]) 02/01/2015   Gait disturbance 02/01/2015   Left foot drop 02/01/2015   Other fatigue  02/01/2015   DS (disseminated sclerosis) ([place]) 01/06/2013   Avitaminosis D 01/06/2013    Conditions to be addressed/monitored: Diabetes Mellitus due to underlying condition with type 1 chronic kidney disease; Multiple Sclerosis, Mixed Hyperlipidemia, Edema unspecified    Care Plan : RN Care Manager Plan of Care  Updates made by Lynne Logan, RN since 07/16/2021 12:00 AM     Problem: No plan established for management of  chronic disease states (Diabetes Mellitus due to underlying condition with type 1 chronic kidney disease; Multiple Sclerosis, Mixed Hyperlipidemia, Edema unspecified)   Priority: High     Long-Range Goal: Development of plan of care for chronic disease management for Diabetes Mellitus due to underlying condition with type 1 chronic kidney disease; Multiple Sclerosis, Mixed Hyperlipidemia, Edema unspecified   Start Date: 07/16/2021  Expected End Date: 07/16/2022  This Visit's Progress: On track  Priority: High  Note:   Current Barriers:  Knowledge Deficits related to plan of care for management of Diabetes Mellitus due to underlying condition with type 1 chronic kidney disease; Multiple Sclerosis, Mixed Hyperlipidemia, Edema unspecified   Chronic Disease Management support and education needs related to Diabetes Mellitus due to underlying condition with type 1 chronic kidney disease; Multiple Sclerosis, Mixed Hyperlipidemia, Edema unspecified    RNCM Clinical Goal(s):  Patient will verbalize basic understanding of  Diabetes Mellitus due to underlying condition with type 1 chronic kidney disease; Multiple Sclerosis, Mixed Hyperlipidemia, Edema unspecified   disease process and self health management plan   take all medications exactly as prescribed and will call provider for medication related questions demonstrate Improved health management independence   continue to work with RN Care Manager to address care management and care coordination needs related to  Diabetes Mellitus due to underlying condition with type 1 chronic kidney disease; Multiple Sclerosis, Mixed Hyperlipidemia, Edema unspecified   will demonstrate ongoing self health care management ability    through collaboration with RN Care manager, provider, and care team.   Interventions: 1:1 collaboration with primary care provider regarding development and update of comprehensive plan of care as evidenced by provider attestation and  co-signature Inter-disciplinary care team collaboration (see longitudinal plan of care) Evaluation of current treatment plan related to  self management and patient's adherence to plan as established by provider  Goal on track:  Yes Evaluation of current treatment plan related to  Edema to lower extremities , self-management and patient's adherence to plan as established by provider. Discussed plans with patient for ongoing care management follow up and provided patient with direct contact information for care management team Provided education to patient re: the importance of adherence to low Sodium diet; increasing water intake; Discussed plans with patient for ongoing care management follow up and provided patient with direct contact information for care management team; Educated patient providing rationale, the importance to continue to wear compression socks to help reduce her edema and reduce the risk for impaired skin integrity and or DVT  Diabetes Interventions: Assessed patient's understanding of A1c goal:  <7.5 Provided education to patient about basic DM disease process; Reviewed medications with patient and discussed importance of medication adherence; Counseled on importance of regular laboratory monitoring as prescribed; Discussed plans with patient for ongoing care management follow up and provided patient with direct contact information for care management team; Advised patient, providing education and rationale, to check cbg daily before meals and at bedtime and record, calling PCP and or RN CM for findings outside established parameters; Review of patient status,  including review of consultants reports, relevant laboratory and other test results, and medications completed; Screening for signs and symptoms of depression related to chronic disease state;  Assessed social determinant of health barriers;  Educated patient on the benefits of exercise to help lower A1c, educated on  exercise recommendations (150 minutes weekly) Determined patient is working on dietary changes and is working with PT twice weekly for strength and endurance to help improve her ability to exercise routinely Determined patient does not want to add Farxiga to her regimen at this time, she prefers to stay on Metformin and continue to use dietary modifications and exercise as a means to manage DM Mailed additional printed educational materials to patient related to Diabetes management for review and discussion during next RN CM follow up call  Educated patient on ways to improve blood flow in order obtain blood sample for cbg Lab Results  Component Value Date   HGBA1C 11.8 (H) 05/01/2021   Goal on track:  Yes Evaluation of current treatment plan related to  Multiple Sclerosis , self-management and patient's adherence to plan as established by provider. Review of patient status, including review of consultant's reports, relevant laboratory and other test results, and medications completed. Reviewed medications with patient and discussed importance of medication adherence Advised patient to continue to follow MD recommendations as directed by Dr. Felecia Shelling for management of MS; Instructed to alert Dr. Felecia Shelling promptly of any/all symptoms suggestive of MS exacerbation; Discussed plans with patient for ongoing care management follow up and provided patient with direct contact information for care management team; Screening for signs and symptoms of depression related to chronic disease state;    Patient Goals/Self-Care Activities: Patient will self administer medications as prescribed Patient will attend all scheduled provider appointments Patient will call pharmacy for medication refills Patient will attend church or other social activities Patient will continue to perform ADL's independently Patient will continue to perform IADL's independently Patient will call provider office for new concerns or  questions  Follow Up Plan:  Telephone follow up appointment with care management team member scheduled for:  09/17/21     Plan:Telephone follow up appointment with care management team member scheduled for:  09/17/21  Barb Merino, RN, BSN, CCM Care Management Coordinator [facility]  Direct Phone: [phone]

## 2021-07-17 NOTE — Patient Instructions (Addendum)
Visit Information   PATIENT GOALS/PLAN OF CARE:   Care Plan : RN Care Manager Plan of Care  Updates made by Lynne Logan, RN since 07/16/2021 12:00 AM     Problem: No plan established for management of chronic disease states (Diabetes Mellitus due to underlying condition with type 1 chronic kidney disease; Multiple Sclerosis, Mixed Hyperlipidemia, Edema unspecified)   Priority: High     Long-Range Goal: Development of plan of care for chronic disease management for Diabetes Mellitus due to underlying condition with type 1 chronic kidney disease; Multiple Sclerosis, Mixed Hyperlipidemia, Edema unspecified   Start Date: 07/16/2021  Expected End Date: 07/16/2022  This Visit's Progress: On track  Priority: High  Note:   Current Barriers:  Knowledge Deficits related to plan of care for management of Diabetes Mellitus due to underlying condition with type 1 chronic kidney disease; Multiple Sclerosis, Mixed Hyperlipidemia, Edema unspecified   Chronic Disease Management support and education needs related to Diabetes Mellitus due to underlying condition with type 1 chronic kidney disease; Multiple Sclerosis, Mixed Hyperlipidemia, Edema unspecified    RNCM Clinical Goal(s):  Patient will verbalize basic understanding of  Diabetes Mellitus due to underlying condition with type 1 chronic kidney disease; Multiple Sclerosis, Mixed Hyperlipidemia, Edema unspecified   disease process and self health management plan   take all medications exactly as prescribed and will call provider for medication related questions demonstrate Improved health management independence   continue to work with RN Care Manager to address care management and care coordination needs related to  Diabetes Mellitus due to underlying condition with type 1 chronic kidney disease; Multiple Sclerosis, Mixed Hyperlipidemia, Edema unspecified   will demonstrate ongoing self health care management ability    through collaboration with RN  Care manager, provider, and care team.   Interventions: 1:1 collaboration with primary care provider regarding development and update of comprehensive plan of care as evidenced by provider attestation and co-signature Inter-disciplinary care team collaboration (see longitudinal plan of care) Evaluation of current treatment plan related to  self management and patient's adherence to plan as established by provider  Goal on track:  Yes Evaluation of current treatment plan related to  Edema to lower extremities , self-management and patient's adherence to plan as established by provider. Discussed plans with patient for ongoing care management follow up and provided patient with direct contact information for care management team Provided education to patient re: the importance of adherence to low Sodium diet; increasing water intake; Discussed plans with patient for ongoing care management follow up and provided patient with direct contact information for care management team; Educated patient providing rationale, the importance to continue to wear compression socks to help reduce her edema and reduce the risk for impaired skin integrity and or DVT  Diabetes Interventions: Assessed patient's understanding of A1c goal:  <7.5 Provided education to patient about basic DM disease process; Reviewed medications with patient and discussed importance of medication adherence; Counseled on importance of regular laboratory monitoring as prescribed; Discussed plans with patient for ongoing care management follow up and provided patient with direct contact information for care management team; Advised patient, providing education and rationale, to check cbg daily before meals and at bedtime and record, calling PCP and or RN CM for findings outside established parameters; Review of patient status, including review of consultants reports, relevant laboratory and other test results, and medications  completed; Screening for signs and symptoms of depression related to chronic disease state;  Assessed social determinant of  health barriers;  Educated patient on the benefits of exercise to help lower A1c, educated on exercise recommendations (150 minutes weekly) Determined patient is working on dietary changes and is working with PT twice weekly for strength and endurance to help improve her ability to exercise routinely Determined patient does not want to add Farxiga to her regimen at this time, she prefers to stay on Metformin and continue to use dietary modifications and exercise as a means to manage DM Mailed additional printed educational materials to patient related to Diabetes management for review and discussion during next RN CM follow up call  Educated patient on ways to improve blood flow in order obtain blood sample for cbg Lab Results  Component Value Date   HGBA1C 11.8 (H) 05/01/2021   Goal on track:  Yes Evaluation of current treatment plan related to  Multiple Sclerosis , self-management and patient's adherence to plan as established by provider. Review of patient status, including review of consultant's reports, relevant laboratory and other test results, and medications completed. Reviewed medications with patient and discussed importance of medication adherence Advised patient to continue to follow MD recommendations as directed by Dr. Felecia Shelling for management of MS; Instructed to alert Dr. Felecia Shelling promptly of any/all symptoms suggestive of MS exacerbation; Discussed plans with patient for ongoing care management follow up and provided patient with direct contact information for care management team; Screening for signs and symptoms of depression related to chronic disease state;    Patient Goals/Self-Care Activities: Patient will self administer medications as prescribed Patient will attend all scheduled provider appointments Patient will call pharmacy for medication  refills Patient will attend church or other social activities Patient will continue to perform ADL's independently Patient will continue to perform IADL's independently Patient will call provider office for new concerns or questions  Follow Up Plan:  Telephone follow up appointment with care management team member scheduled for:  09/17/21      Consent to CCM Services: Ms. Mohs was given information about Chronic Care Management services including:  CCM service includes personalized support from designated clinical staff supervised by her physician, including individualized plan of care and coordination with other care providers 24/7 contact phone numbers for assistance for urgent and routine care needs. Service will only be billed when office clinical staff spend 20 minutes or more in a month to coordinate care. Only one practitioner may furnish and bill the service in a calendar month. The patient may stop CCM services at any time (effective at the end of the month) by phone call to the office staff. The patient will be responsible for cost sharing (co-pay) of up to 20% of the service fee (after annual deductible is met).  Patient agreed to services and verbal consent obtained.   The patient verbalized understanding of instructions, educational materials, and care plan provided today and declined offer to receive copy of patient instructions, educational materials, and care plan.   Telephone follow up appointment with care management team member scheduled for: 09/17/21  Barb Merino, RN, BSN, CCM Care Management Coordinator [facility]  Direct Phone: [phone]

## 2021-07-17 NOTE — Progress Notes (Signed)
This encounter was created in error - please disregard.

## 2021-07-24 ENCOUNTER — Encounter: Payer: Self-pay | Admitting: Neurology

## 2021-07-24 ENCOUNTER — Telehealth: Payer: Self-pay

## 2021-07-24 ENCOUNTER — Telehealth: Payer: Self-pay | Admitting: Neurology

## 2021-07-24 NOTE — Telephone Encounter (Signed)
Pt called states she forgot to inform us in the previous letter that Dr. Felecia Shelling wrote for her that she is needing a walk-in shower built. Pt asking if the letter could be emailed to her.

## 2021-07-24 NOTE — Progress Notes (Signed)
Letter written

## 2021-07-24 NOTE — Chronic Care Management (AMB) (Signed)
Chronic Care Management Pharmacy Assistant   Name: WESTLYNN FIFER  MRN: [id number] DOB: 1966/06/19  Reason for Encounter: Medication Review   Recent office visits:  07/16/2021- Barb Merino, RN (CCM)  07/03/2021- Daneen Schick, LSCW (CCM)  Recent consult visits:  06/27/2021- Arlice Colt, MD (Neurology)- No medication changes  Hospital visits:  None in previous 6 months  Reviewed chart for medication changes ahead of medication coordination call.  No medication changes indicated.   BP Readings from Last 3 Encounters:  05/01/21 132/80  09/09/20 140/73  08/02/20 139/90    Lab Results  Component Value Date   HGBA1C 11.8 (H) 05/01/2021     Patient obtains medications through Vials  30 Days   Last adherence delivery included: (New onboard) Farxiga 10 mg- 1 tablet before breakfast sent 07/03/2021 (new prescription)  Patient declined the following medications last month: (New onboard last month)  Patient is due for next adherence delivery on: 08/02/2021. Called patient and reviewed medications and coordinated delivery.  This delivery to include: Metformin 500 mg- 1 tablet twice daily  No short fill or acute fill needed.  Patient declined the following medications: Farxiga 10 mg- 1 tablet daily due to patient not wanting to take. Atrovastatin 10 mg- 1 tablet at bedtime Monday- Friday due to patient not wanting to take.   Patient needs refills for: None.  Confirmed delivery date of 08/02/2021, advised patient that pharmacy will contact them the morning of delivery.   NOTE: Patient expressed how she did not want to take too many medications. She declined taking the Farxiga 10 mg and Atorvastatin  10 mg. Patient states she will continue with take Metformin 500 mg twice daily and to watch her diet. Patient is checking her blood sugars and readings have improved. Patient checks her blood sugars daily fasting, recent readings: 07/24/2021- 98 07/23/2021- 168 (non  fasting) rechecked 2 more times later in the day readings of 156 and 117 07/22/2021- 117 07/20/2021- 123 07/18/2021- 106 07/17/2021- 110 Patient will continue to monitor without the additional medications. Patient has a follow up visit with [name], Clinical Pharmacist on 08/20/2021 and follow up visit with [name], NP on 09/11/2021. Patient inquired when should she have A1C rechecked. Informed patient they will check these levels every 3 months. Patient aware.  Medications: Outpatient Encounter Medications as of 07/24/2021  Medication Sig Note   atorvastatin (LIPITOR) 10 MG tablet Take 1 tablet by mouth at bedtime Mon - Fri    Blood Glucose Monitoring Suppl (FREESTYLE LITE) w/Device KIT Inject 1 each into the skin 2 (two) times daily. dxE11.65    Cholecalciferol (VITAMIN D3) 50 MCG (2000 UT) TABS TAKE 1 TABLET BY MOUTH EVERY DAY OR AS DIRECTED    [employer]S B12 QUICK DISSOLVE 500 MCG LOZG TAKE 2 TABS BY MOUTH DAILY    dapagliflozin propanediol (FARXIGA) 10 MG TABS tablet Take 1 tablet (10 mg total) by mouth daily before breakfast. (Patient not taking: Reported on 07/17/2021)    Dimethyl Fumarate 240 MG CPDR Take 240 mg by mouth 2 (two) times daily. 11/06/2020: CaseId:[id number];Status:Approved;Review Type:Prior Auth;Coverage Start Date:10/07/2020;Coverage End Date:11/06/2021;   doxycycline (VIBRAMYCIN) 100 MG capsule Take 1 capsule (100 mg total) by mouth 2 (two) times daily. (Patient not taking: Reported on 05/21/2021)    glucose blood (FREESTYLE LITE) test strip Use as instructed to check blood sugars 2 times per day dx:e11.65    ketoconazole (NIZORAL) 2 % cream Apply 1 application topically daily.    metFORMIN (GLUCOPHAGE) 500 MG  tablet Take 1 tablet (500 mg total) by mouth 2 (two) times daily with a meal.    Microlet Lancets MISC Use as directed to check blood sugars 2 times per day dx: e11.65    nystatin powder Apply 1 application topically 3 (three) times daily.    Probiotic Product  (PROBIOTIC PO) Take 1 Dose by mouth daily. (Patient not taking: Reported on 05/21/2021)    TURMERIC-GINGER PO Take 2 each by mouth daily. Gummies    UNABLE TO FIND Med Name: Terrence Dupont take 2 tablets daily    No facility-administered encounter medications on file as of 07/24/2021.    [name], [place] Pharmacist Assistant [phone]

## 2021-07-24 NOTE — Telephone Encounter (Signed)
Called the patient to clarify.  Patient states that the letter that was written in September is fine although it needs to have an additional information recommending the patient has a built in shower.  Patient was on a second level and is now been moved to a ground level.  On the ground level she only has a half bath and is needing to add an additional walk-in shower.  Advised I would write a letter and have Dr. Garth Bigness time.  Confirmed the address on file is correct.

## 2021-08-14 DIAGNOSIS — E1122 Type 2 diabetes mellitus with diabetic chronic kidney disease: Secondary | ICD-10-CM | POA: Diagnosis not present

## 2021-08-14 DIAGNOSIS — E782 Mixed hyperlipidemia: Secondary | ICD-10-CM

## 2021-08-14 DIAGNOSIS — Z7984 Long term (current) use of oral hypoglycemic drugs: Secondary | ICD-10-CM

## 2021-08-14 DIAGNOSIS — G35 Multiple sclerosis: Secondary | ICD-10-CM

## 2021-08-14 DIAGNOSIS — N181 Chronic kidney disease, stage 1: Secondary | ICD-10-CM

## 2021-08-19 ENCOUNTER — Telehealth: Payer: Self-pay

## 2021-08-19 NOTE — Chronic Care Management (AMB) (Signed)
   [name] was reminded to have all medications, supplements and any blood glucose and blood pressure readings available for review with [name], Pharm. D, at her telephone visit on 12-06-202 at 3:00.   Questions: Have you had any recent office visit or specialist visit outside of [place]? Patient stated No  Are there any concerns you would like to discuss during your office visit? Patient stated No  Are you having any problems obtaining your medications? (Whether it pharmacy issues or cost) Patient stated No  If patient has any PAP medications ask if they are having any problems getting their PAP medication or refill? No PAP medications  Care Gaps: URINE MICROALBUMIN (Yearly)   TETANUS/TDAP (Every 10 Years) Zoster Vaccines- Shingrix (1 of 2) PAP SMEAR-Modifier (Every 3 Years) COLONOSCOPY (Pts 45-20yrs Insurance coverage will need to be confirmed) (Every 10 Years)  MAMMOGRAM (Every 2 Years) INFLUENZA VACCINE (Every 8 Months, August to March) COVID-19 Vaccine (5 - Booster for [employer] series) AWV 08-29-2021  Star Rating Drug: Metformin 500 mg- Last filled 06-13-2021 [facility]  Any gaps in medications fill history?No  [place] Pharmacist Assistant [phone]

## 2021-08-20 ENCOUNTER — Ambulatory Visit: Payer: BC Managed Care – PPO

## 2021-08-20 DIAGNOSIS — E0822 Diabetes mellitus due to underlying condition with diabetic chronic kidney disease: Secondary | ICD-10-CM

## 2021-08-20 DIAGNOSIS — N181 Chronic kidney disease, stage 1: Secondary | ICD-10-CM

## 2021-08-20 DIAGNOSIS — E782 Mixed hyperlipidemia: Secondary | ICD-10-CM

## 2021-08-20 NOTE — Progress Notes (Signed)
Chronic Care Management Pharmacy Note  08/21/2021 Name:  [name] MRN:  [id number] DOB:  1965/10/28  Summary: Patient reports   Recommendations/Changes made from today's visit: Recommend patient increase the amount of water she is drinking.   Plan: She is going to increase the amount of water she drinks 4 cups of water. She is going to do more walking from one room to another room.    Subjective: [name] is an 55 y.o. year old female who is a primary patient of [name], [place].  The CCM team was consulted for assistance with disease management and care coordination needs.    Engaged with patient by telephone for follow up visit in response to provider referral for pharmacy case management and/or care coordination services.   Consent to Services:  The patient was given information about Chronic Care Management services, agreed to services, and gave verbal consent prior to initiation of services.  Please see initial visit note for detailed documentation.   Patient Care Team: [name], FNP as PCP - General (General Practice) Lynne Logan, RN as Case Manager Daneen Schick as Social Worker Mayford Knife, [facility] (Pharmacist)  Recent office visits: 05/01/2021 PCP OV   Recent consult visits: 06/27/2021 Neurology [facility] visits: None in previous 6 months   Objective:  Lab Results  Component Value Date   CREATININE 0.71 05/01/2021   BUN 11 05/01/2021   GFRNONAA 84 02/20/2020   GFRAA 97 02/20/2020   NA 137 05/01/2021   K 4.4 05/01/2021   CALCIUM 9.7 05/01/2021   CO2 21 05/01/2021   GLUCOSE 443 (H) 05/01/2021    Lab Results  Component Value Date/Time   HGBA1C 11.8 (H) 05/01/2021 04:38 PM    Last diabetic Eye exam: No results found for: HMDIABEYEEXA  Last diabetic Foot exam: No results found for: HMDIABFOOTEX   No results found for: CHOL, HDL, LDLCALC, LDLDIRECT, TRIG, CHOLHDL  Hepatic Function Latest Ref Rng & Units 05/01/2021  02/20/2020 06/15/2019  Total Protein 6.0 - 8.5 g/dL 7.2 7.6 6.9  Albumin 3.8 - 4.9 g/dL 4.4 4.3 4.3  AST 0 - 40 IU/L _0 ALT 0 - 32 IU/L _1 Alk Phosphatase 44 - 121 IU/L 80 69 60  Total Bilirubin 0.0 - 1.2 mg/dL 0.3 0.3 0.3  Bilirubin, Direct 0.00 - 0.40 mg/dL - - -    Lab Results  Component Value Date/Time   TSH 4.120 08/02/2020 04:44 PM   TSH 4.53 03/25/2020 12:00 AM    CBC Latest Ref Rng & Units 05/01/2021 08/02/2020 06/15/2019  WBC 3.4 - 10.8 x10E3/uL 8.0 9.7 8.2  Hemoglobin 11.1 - 15.9 g/dL 14.0 15.4 13.5  Hematocrit 34.0 - 46.6 % 41.5 45.8 39.8  Platelets 150 - 450 x10E3/uL 291 310 277    Lab Results  Component Value Date/Time   VD25OH 52.2 06/15/2019 04:31 PM   VD25OH 48.5 10/23/2015 10:15 AM    Clinical ASCVD: No  The ASCVD Risk score (Arnett DK, et al., 2019) failed to calculate for the following reasons:   Cannot find a previous HDL lab   Cannot find a previous total cholesterol lab    Depression screen [facility] 2/9 08/29/2020 02/20/2020 06/13/2019  Decreased Interest 0 0 0  Down, Depressed, Hopeless 0 0 0  PHQ - 2 Score 0 0 0    Social History   Tobacco Use  Smoking Status Never  Smokeless Tobacco Never   BP Readings from Last  3 Encounters:  05/01/21 132/80  09/09/20 140/73  08/02/20 139/90   Pulse Readings from Last 3 Encounters:  05/01/21 (!) 108  09/09/20 (!) 113  08/02/20 92   Wt Readings from Last 3 Encounters:  09/18/20 290 lb (131.5 kg)  08/29/20 290 lb (131.5 kg)  08/29/20 290 lb (131.5 kg)   BMI Readings from Last 3 Encounters:  09/18/20 46.81 kg/m  08/29/20 45.42 kg/m  08/29/20 45.42 kg/m    Assessment/Interventions: Review of patient past medical history, allergies, medications, health status, including review of consultants reports, laboratory and other test data, was performed as part of comprehensive evaluation and provision of chronic care management services.   SDOH:  (Social Determinants of Health) assessments and  interventions performed: No  SDOH Screenings   Alcohol Screen: Not on file  Depression (PHQ2-9): Low Risk    PHQ-2 Score: 0  Financial Resource Strain: Low Risk    Difficulty of Paying Living Expenses: Not very hard  Food Insecurity: No Food Insecurity   Worried About Charity fundraiser in the Last Year: Never true   Ran Out of Food in the Last Year: Never true  Housing: Low Risk    Last Housing Risk Score: 0  Physical Activity: Inactive   Days of Exercise per Week: 0 days   Minutes of Exercise per Session: 0 min  Social Connections: Not on file  Stress: No Stress Concern Present   Feeling of Stress : Not at all  Tobacco Use: Low Risk    Smoking Tobacco Use: Never   Smokeless Tobacco Use: Never   Passive Exposure: Not on file  Transportation Needs: No Transportation Needs   Lack of Transportation (Medical): No   Lack of Transportation (Non-Medical): No    CCM Care Plan  Allergies  Allergen Reactions   Food Other (See Comments)    WHAT FOOD CAUSES THIS REACTION? Eyes swelling and throat swelling   Oxycodone Hcl Hives   Soy Allergy     Other reaction(s): SWELLING    Medications Reviewed Today     Reviewed by Mayford Knife, RPH (Pharmacist) on 08/20/21 at 1526  Med List Status: <None>   Medication Order Taking? Sig Documenting Provider Last Dose Status Informant  atorvastatin (LIPITOR) 10 MG tablet [id number] No Take 1 tablet by mouth at bedtime Mon - Fri  Patient not taking: Reported on 08/20/2021   [name], FNP Not Taking Active   Blood Glucose Monitoring Suppl (FREESTYLE LITE) w/Device KIT [id number] Yes Inject 1 each into the skin 2 (two) times daily. dxE11.65 [name], NP Taking Active   Cholecalciferol (VITAMIN D3) 50 MCG (2000 UT) TABS [id number] Yes TAKE 1 TABLET BY MOUTH EVERY DAY OR AS DIRECTED Sater, Nanine Means, MD Taking Active   [facility] B12 QUICK DISSOLVE 500 MCG LOZG [id number] Yes TAKE 2 TABS BY MOUTH DAILY Sater, Nanine Means, MD Taking Active    dapagliflozin propanediol (FARXIGA) 10 MG TABS tablet [id number] No Take 1 tablet (10 mg total) by mouth daily before breakfast.  Patient not taking: Reported on 07/17/2021   [name], FNP Not Taking Active   Dimethyl Fumarate 240 MG CPDR [id number] Yes Take 240 mg by mouth 2 (two) times daily. [provider] Taking Active            Med Note Ronnald Ramp, EMMA L   Tue Nov 06, 2020 11:42 AM) CaseId:[id number];Status:Approved;Review Type:Prior Auth;Coverage Start Date:10/07/2020;Coverage End Date:11/06/2021;  glucose blood (FREESTYLE LITE) test strip [id number]  Use as instructed to check  blood sugars 2 times per day dx:e11.65 Ghumman, Ramandeep, NP  Active   ketoconazole (NIZORAL) 2 % cream [id number]  Apply 1 application topically daily. [name], NP  Active   metFORMIN (GLUCOPHAGE) 500 MG tablet [id number] Yes Take 1 tablet (500 mg total) by mouth 2 (two) times daily with a meal. [name], FNP Taking Active   Microlet Lancets MISC [id number] Yes Use as directed to check blood sugars 2 times per day dx: e11.65 [name], NP Taking Active   nystatin powder [id number]  Apply 1 application topically 3 (three) times daily. [name], NP  Active   Probiotic Product (PROBIOTIC PO) [id number]  Take 1 Dose by mouth daily.  Patient not taking: Reported on 05/21/2021   [provider]  Active   TURMERIC-GINGER PO [id number]  Take 2 each by mouth daily. Gummies [provider]  Active     Discontinued 08/20/21 1526 (Patient Preference)             Patient Active Problem List   Diagnosis Date Noted   Edema 08/29/2020   Lymphedema 08/02/2020   Stress 08/02/2020   Gait abnormality 08/02/2020   Acute urinary tract infection 10/28/2018   High risk medication use 10/21/2016   Urinary frequency 04/22/2016   Multiple sclerosis ([place]) 02/01/2015   Gait disturbance 02/01/2015   Left foot drop 02/01/2015   Other fatigue 02/01/2015   DS (disseminated  sclerosis) ([place]) 01/06/2013   Avitaminosis D 01/06/2013    Immunization History  Administered Date(s) Administered   Influenza,inj,Quad PF,6+ Mos 06/21/2019   PFIZER Comirnaty(Gray Top)Covid-19 Tri-Sucrose Vaccine 03/25/2021   PFIZER(Purple Top)SARS-COV-2 Vaccination 02/02/2020, 03/13/2020, 09/25/2020    Conditions to be addressed/monitored:  Hyperlipidemia and Diabetes  Care Plan : [place]  Updates made by Mayford Knife, RPH since 08/21/2021 12:00 AM     Problem: HLD, DM II      Long-Range Goal: Disease Management   Recent Progress: On track  Note:   Current Barriers:  Does not maintain contact with provider office Does not contact provider office for questions/concerns  Pharmacist Clinical Goal(s):  Patient will achieve adherence to monitoring guidelines and medication adherence to achieve therapeutic efficacy through collaboration with PharmD and provider.   Interventions: 1:1 collaboration with [name], FNP regarding development and update of comprehensive plan of care as evidenced by provider attestation and co-signature Inter-disciplinary care team collaboration (see longitudinal plan of care) Comprehensive medication review performed; medication list updated in electronic medical record  Hyperlipidemia: (LDL goal < 70) -Uncontrolled -Current treatment: Atorvastatin 10 mg tablet  -Medications previously tried: none noted at this time   -Current dietary patterns: avoiding fried foods,  -Current exercise habits: has a physical therapist come in twice per week for an hour. She is doing leg lifts, upper body exercise, and walking around the house. She is also doing weights for her arms.  -Patient reports she is not currently taking Atorvastatin 10 mg tablet daily.  -Educated on Cholesterol goals;  Benefits of statin for ASCVD risk reduction; -Counseled on diet and exercise extensively Educated on statin benefits but at this time she would like  to continue diet and lifestyle changes.   Diabetes (A1c goal <7%) -Uncontrolled -Current medications: Metformin 500 mg taking 1 tablet by mouth twice per day  -Medications previously tried:  -Current home glucose readings fasting glucose: 11/21-129, 11/22-122, 11/24-148, 11/25 - 129, 11/26-137, 11/27-124, 11/28-133, 11/29-132, 12/1-117, 12/2-148, 12/3-104, 12/4-120, 12/5-111, 12/6/-114  -Denies hypoglycemic/hyperglycemic symptoms -Current meal patterns:  breakfast:  cereal - apple cheerios   lunch: left over from dinner, or a salad - she is trying to stay away from the bread, carbs and pasta  dinner: protein - meat or fish, with a vegetable-no other sides, baked or fried  drinks: she is drinking water, about a 2 and 1/2 cups  -Current exercise: please see cholesterol  -Educated on A1c and blood sugar goals; Complications of diabetes including kidney damage, retinal damage, and cardiovascular disease; Benefits of weight loss; -Counseled to check feet daily and get yearly eye exams -Counseled on diet and exercise extensively  Patient Goals/Self-Care Activities Patient will:  - take medications as prescribed as evidenced by patient report and record review check glucose at least two times per day, document, and provide at future appointments  Follow Up Plan: The patient has been provided with contact information for the care management team and has been advised to call with any health related questions or concerns.       Medication Assistance: None required.  Patient affirms current coverage meets needs.  Compliance/Adherence/Medication fill history: Care Gaps: Pneumonia Vaccine Urine Microalbumin TDAP Shingrix Vaccine PAP Smear - Modifier Colonoscopy Mammogram Influenza Vaccine COVID-19 Vaccine Booster  Star-Rating Drugs: Metformin 500 mg tablet  Atorvastatin 10 mg tablet  Farxiga 10 mg tablet   Patient[facility]  [id number] [address]Phone: [phone]: [phone] [place] [address]Phone: [phone]: [phone] [facility] [place] [place]: [phone]: [phone] Uses pill box? No - patient does not use a pill box at this time  Pt endorses 90% compliance  We discussed: Benefits of medication synchronization, packaging and delivery as well as enhanced pharmacist oversight with [facility]. Patient decided to: Continue current medication management strategy  Care Plan and Follow Up Patient Decision:  Patient agrees to Care Plan and Follow-up.  Plan: The patient has been provided with contact information for the care management team and has been advised to call with any health related questions or concerns.   VOrlando Penner CPP, PharmD Clinical Pharmacist Practitioner [facility] [phone]

## 2021-08-21 NOTE — Patient Instructions (Addendum)
Visit Information It was great speaking with you today!  Please let me know if you have any questions about our visit.   Goals Addressed             This Visit's Progress    Manage My Medicine       Timeframe:  Long-Range Goal Priority:  High Start Date:                             Expected End Date:                       Follow Up Date 10/18/2021   In Progress:   - call for medicine refill 2 or 3 days before it runs out - call if I am sick and can't take my medicine - keep a list of all the medicines I take; vitamins and herbals too - use a pillbox to sort medicine - use an alarm clock or phone to remind me to take my medicine    Why is this important?   These steps will help you keep on track with your medicines.   Notes:  Please contact us if you have any questions about your medications        Patient Care Plan: CCM Pharmacy Care Plan     Problem Identified: HLD, DM II      Long-Range Goal: Disease Management   Recent Progress: On track  Note:   Current Barriers:  Does not maintain contact with provider office Does not contact provider office for questions/concerns  Pharmacist Clinical Goal(s):  Patient will achieve adherence to monitoring guidelines and medication adherence to achieve therapeutic efficacy through collaboration with PharmD and provider.   Interventions: 1:1 collaboration with [name], FNP regarding development and update of comprehensive plan of care as evidenced by provider attestation and co-signature Inter-disciplinary care team collaboration (see longitudinal plan of care) Comprehensive medication review performed; medication list updated in electronic medical record  Hyperlipidemia: (LDL goal < 70) -Uncontrolled -Current treatment: Atorvastatin 10 mg tablet  -Medications previously tried: none noted at this time   -Current dietary patterns: avoiding fried foods,  -Current exercise habits: has a physical therapist come in  twice per week for an hour. She is doing leg lifts, upper body exercise, and walking around the house. She is also doing weights for her arms.  -Patient reports  -Educated on Cholesterol goals;  Benefits of statin for ASCVD risk reduction; -Counseled on diet and exercise extensively Educated on medication benefits but at this time she would like to continue diet and lifestyle changes.   Diabetes (A1c goal <7%) -Uncontrolled -Current medications: Metformin 500 mg taking 1 tablet by mouth twice per day  -Medications previously tried:  -Current home glucose readings fasting glucose: 11/21-129, 11/22-122, 11/24-148, 11/25 - 129, 11/26-137, 11/27-124, 11/28-133, 11/29-132, 12/1-117, 12/2-148, 12/3-104, 12/4-120, 12/5-111, 12/6/-114  -Denies hypoglycemic/hyperglycemic symptoms -Current meal patterns:  breakfast: cereal - apple cheerios   lunch: left over from dinner, or a salad - she is trying to stay away from the bread, carbs and pasta  dinner: protein - meat or fish, with a vegetable-no other sides, baked or fried  drinks: she is drinking water, about a 2 and 1/2 cups  -Current exercise: please see cholesterol  -Educated on A1c and blood sugar goals; Complications of diabetes including kidney damage, retinal damage, and cardiovascular disease; Benefits of weight loss; -Counseled to check feet daily and  get yearly eye exams -Counseled on diet and exercise extensively  Patient Goals/Self-Care Activities Patient will:  - take medications as prescribed as evidenced by patient report and record review check glucose at least two times per day, document, and provide at future appointments  Follow Up Plan: The patient has been provided with contact information for the care management team and has been advised to call with any health related questions or concerns.      Patient agreed to services and verbal consent obtained.   The patient verbalized understanding of instructions, educational  materials, and care plan provided today and agreed to receive a mailed copy of patient instructions, educational materials, and care plan.   [name], PharmD Clinical Pharmacist [facility] [phone]

## 2021-08-22 ENCOUNTER — Telehealth: Payer: Self-pay

## 2021-08-22 NOTE — Progress Notes (Signed)
    Chronic Care Management Pharmacy Assistant   Name: [name]  MRN: [id number] DOB: Aug 08, 1966   Reason for Encounter: Medication Review    Recent office visits: None  Recent consult visits: None  Hospital visits:  None in previous 6 months  Medications: Outpatient Encounter Medications as of 08/22/2021  Medication Sig Note   atorvastatin (LIPITOR) 10 MG tablet Take 1 tablet by mouth at bedtime Mon - Fri (Patient not taking: Reported on 08/20/2021)    Blood Glucose Monitoring Suppl (FREESTYLE LITE) w/Device KIT Inject 1 each into the skin 2 (two) times daily. dxE11.65    Cholecalciferol (VITAMIN D3) 50 MCG (2000 UT) TABS TAKE 1 TABLET BY MOUTH EVERY DAY OR AS DIRECTED    [facility] B12 QUICK DISSOLVE 500 MCG LOZG TAKE 2 TABS BY MOUTH DAILY    dapagliflozin propanediol (FARXIGA) 10 MG TABS tablet Take 1 tablet (10 mg total) by mouth daily before breakfast. (Patient not taking: Reported on 07/17/2021)    Dimethyl Fumarate 240 MG CPDR Take 240 mg by mouth 2 (two) times daily. 11/06/2020: CaseId:[id number];Status:Approved;Review Type:Prior Auth;Coverage Start Date:10/07/2020;Coverage End Date:11/06/2021;   glucose blood (FREESTYLE LITE) test strip Use as instructed to check blood sugars 2 times per day dx:e11.65    ketoconazole (NIZORAL) 2 % cream Apply 1 application topically daily.    metFORMIN (GLUCOPHAGE) 500 MG tablet Take 1 tablet (500 mg total) by mouth 2 (two) times daily with a meal.    Microlet Lancets MISC Use as directed to check blood sugars 2 times per day dx: e11.65    nystatin powder Apply 1 application topically 3 (three) times daily.    Probiotic Product (PROBIOTIC PO) Take 1 Dose by mouth daily. (Patient not taking: Reported on 05/21/2021)    TURMERIC-GINGER PO Take 2 each by mouth daily. Gummies    No facility-administered encounter medications on file as of 08/22/2021.     BP Readings from Last 3 Encounters:  05/01/21 132/80  09/09/20 140/73  08/02/20 139/90    Lab  Results  Component Value Date   HGBA1C 11.8 (H) 05/01/2021     Patient obtains medications through Vials  30 Days   Last adherence delivery included:  Metformin $RemoveBe'500mg'bzVZOtMui$   1 tablet 2 times a day   Patient is due for next adherence delivery on: 09/03/21. Called patient and reviewed medications and coordinated delivery.    This delivery to include:  Metformin $RemoveBe'500mg'cKmssosSK$   1 tablet 2 times a day  Patient declined the following medications: Farxiga 10 mg- 1 tablet daily due to patient not wanting to take. Atrovastatin 10 mg- 1 tablet at bedtime Monday- Friday due to patient not wanting to take.    Patient needs refills for None.  Confirmed delivery date of 09/03/2021, advised patient that pharmacy will contact them the morning of delivery.   Called patient 08/22/2021 at 2:39pm line busy Called patient 08/23/2021 at 11:27am left messsage  [place] Pharmacist Assistant [phone]

## 2021-08-29 ENCOUNTER — Ambulatory Visit: Payer: BC Managed Care – PPO | Admitting: Nurse Practitioner

## 2021-08-29 ENCOUNTER — Ambulatory Visit (INDEPENDENT_AMBULATORY_CARE_PROVIDER_SITE_OTHER): Payer: BC Managed Care – PPO

## 2021-08-29 VITALS — Ht 67.0 in | Wt 285.0 lb

## 2021-08-29 DIAGNOSIS — Z0000 Encounter for general adult medical examination without abnormal findings: Secondary | ICD-10-CM | POA: Diagnosis not present

## 2021-08-29 NOTE — Progress Notes (Signed)
I connected with [name] today by telephone and verified that I am speaking with the correct person using two identifiers. Location patient: home Location provider: work Persons participating in the virtual visit: Mayleen Dekayla, [place] LPN.   I discussed the limitations, risks, security and privacy concerns of performing an evaluation and management service by telephone and the availability of in person appointments. I also discussed with the patient that there may be a patient responsible charge related to this service. The patient expressed understanding and verbally consented to this telephonic visit.    Interactive audio and video telecommunications were attempted between this provider and patient, however failed, due to patient having technical difficulties OR patient did not have access to video capability.  We continued and completed visit with audio only.     Vital signs may be patient reported or missing.  Subjective:   [name] is a 55 y.o. female who presents for Medicare Annual (Subsequent) preventive examination.  Review of Systems     Cardiac Risk Factors include: diabetes mellitus;obesity (BMI >30kg/m2)     Objective:    Today's Vitals   08/29/21 1127  Weight: 285 lb (129.3 kg)  Height: 5' 7" (1.702 m)   Body mass index is 44.64 kg/m.  Advanced Directives 08/29/2021 08/29/2020 06/13/2019 10/28/2018 10/28/2018  Does Patient Have a Medical Advance Directive? _0   Would patient like information on creating a medical advance directive? - - Yes (MAU/Ambulatory/Procedural Areas - Information given) No - Patient declined -    Current Medications (verified) Outpatient Encounter Medications as of 08/29/2021  Medication Sig   Blood Glucose Monitoring Suppl (FREESTYLE LITE) w/Device KIT Inject 1 each into the skin 2 (two) times daily. dxE11.65   Cholecalciferol (VITAMIN D3) 50 MCG (2000 UT) TABS TAKE 1 TABLET BY MOUTH EVERY DAY OR AS  DIRECTED   [facility] B12 QUICK DISSOLVE 500 MCG LOZG TAKE 2 TABS BY MOUTH DAILY   Dimethyl Fumarate 240 MG CPDR Take 240 mg by mouth 2 (two) times daily.   glucose blood (FREESTYLE LITE) test strip Use as instructed to check blood sugars 2 times per day dx:e11.65   metFORMIN (GLUCOPHAGE) 500 MG tablet Take 1 tablet (500 mg total) by mouth 2 (two) times daily with a meal.   Microlet Lancets MISC Use as directed to check blood sugars 2 times per day dx: e11.65   Probiotic Product (PROBIOTIC PO) Take 1 Dose by mouth daily.   atorvastatin (LIPITOR) 10 MG tablet Take 1 tablet by mouth at bedtime Mon - Fri (Patient not taking: Reported on 08/20/2021)   dapagliflozin propanediol (FARXIGA) 10 MG TABS tablet Take 1 tablet (10 mg total) by mouth daily before breakfast. (Patient not taking: Reported on 07/17/2021)   ketoconazole (NIZORAL) 2 % cream Apply 1 application topically daily. (Patient not taking: Reported on 08/29/2021)   nystatin powder Apply 1 application topically 3 (three) times daily. (Patient not taking: Reported on 08/29/2021)   TURMERIC-GINGER PO Take 2 each by mouth daily. Gummies (Patient not taking: Reported on 08/29/2021)   No facility-administered encounter medications on file as of 08/29/2021.    Allergies (verified) Food, Oxycodone hcl, and Soy allergy   History: Past Medical History:  Diagnosis Date   Movement disorder    Multiple sclerosis ([place])    Past Surgical History:  Procedure Laterality Date   CESAREAN SECTION     UTERINE FIBROID SURGERY     WISDOM TOOTH EXTRACTION     Family History  Problem Relation Age of Onset   Hypertension Mother    Hypertension Father    Diabetes Father    Social History   Socioeconomic History   Marital status: Married    Spouse name: Not on file   Number of children: Not on file   Years of education: Not on file   Highest education level: Not on file  Occupational History   Not on file  Tobacco Use   Smoking status: Never    Smokeless tobacco: Never  Vaping Use   Vaping Use: Never used  Substance and Sexual Activity   Alcohol use: No   Drug use: No   Sexual activity: Not Currently  Other Topics Concern   Not on file  Social History Narrative   Not on file   Social Determinants of Health   Financial Resource Strain: Low Risk    Difficulty of Paying Living Expenses: Not hard at all  Food Insecurity: No Food Insecurity   Worried About Charity fundraiser in the Last Year: Never true   [place] in the Last Year: Never true  Transportation Needs: No Transportation Needs   Lack of Transportation (Medical): No   Lack of Transportation (Non-Medical): No  Physical Activity: Inactive   Days of Exercise per Week: 0 days   Minutes of Exercise per Session: 0 min  Stress: No Stress Concern Present   Feeling of Stress : Not at all  Social Connections: Not on file    Tobacco Counseling Counseling given: Not Answered   Clinical Intake:  Pre-visit preparation completed: Yes  Pain : No/denies pain     Nutritional Status: BMI > 30  Obese Nutritional Risks: None Diabetes: Yes  How often do you need to have someone help you when you read instructions, pamphlets, or other written materials from your doctor or pharmacy?: 1 - Never What is the last grade level you completed in school?: BA degree  Diabetic? Yes Nutrition Risk Assessment:  Has the patient had any N/V/D within the last 2 months?  No  Does the patient have any non-healing wounds?  No  Has the patient had any unintentional weight loss or weight gain?  No   Diabetes:  Is the patient diabetic?  Yes  If diabetic, was a CBG obtained today?  No  Did the patient bring in their glucometer from home?  No  How often do you monitor your CBG's? daily.   Financial Strains and Diabetes Management:  Are you having any financial strains with the device, your supplies or your medication? No .  Does the patient want to be seen by Chronic Care  Management for management of their diabetes?  No  Would the patient like to be referred to a Nutritionist or for Diabetic Management?  No   Diabetic Exams:  Diabetic Eye Exam: Overdue for diabetic eye exam. Pt has been advised about the importance in completing this exam. Patient advised to call and schedule an eye exam. Diabetic Foot Exam: Overdue, Pt has been advised about the importance in completing this exam. Pt is scheduled for diabetic foot exam on next appointment.   Interpreter Needed?: No  Information entered by :: NAllen LPN   Activities of Daily Living In your present state of health, do you have any difficulty performing the following activities: 08/29/2021 08/29/2020  Hearing? N N  Vision? N N  Difficulty concentrating or making decisions? N N  Walking or climbing stairs? Y Y  Dressing or bathing?  N Y  Doing errands, shopping? [name]  Preparing Food and eating ? N Y  Using the Toilet? N N  In the past six months, have you accidently leaked urine? Y Y  Do you have problems with loss of bowel control? N Y  Managing your Medications? N N  Managing your Finances? N N  Housekeeping or managing your Housekeeping? [name]  Some recent data might be hidden    Patient Care Team: [name], FNP as PCP - General ([place]) Rex Kras, Claudette Stapler, RN as Case Manager Daneen Schick as Social Worker [place], Sharyn Blitz, [facility] (Pharmacist)  Indicate any recent Medical Services you may have received from other than [facility] providers in the past year (date may be approximate).     Assessment:   This is a routine wellness examination for Bhavika.  Hearing/Vision screen Vision Screening - Comments:: No regular eye exams,  Dietary issues and exercise activities discussed: Current Exercise Habits: The patient does not participate in regular exercise at present   Goals Addressed             This Visit's Progress    Patient Stated       08/29/2021, wants to stay on a healthy  eating track       Depression Screen PHQ 2/9 Scores 08/29/2021 08/29/2020 02/20/2020 06/13/2019  PHQ - 2 Score 0 0 0 0    Fall Risk Fall Risk  08/29/2021 08/29/2020 02/20/2020 06/13/2019 04/23/2017  Falls in the past year? 1 0 1 1 Yes  Comment trying to move too fast - - - -  Number falls in past yr: 1 - 0 0 2 or more  Comment - - - - 5 falls  Injury with Fall? 0 - 0 0 No  Risk for fall due to : Impaired balance/gait;Impaired mobility;Medication side effect Impaired balance/gait;Impaired mobility - - -  Follow up Falls evaluation completed;Education provided;Falls prevention discussed Falls evaluation completed;Education provided;Falls prevention discussed - - -    FALL RISK PREVENTION PERTAINING TO THE HOME:  Any stairs in or around the home? No  If so, are there any without handrails? N/a Home free of loose throw rugs in walkways, pet beds, electrical cords, etc? Yes  Adequate lighting in your home to reduce risk of falls? Yes   ASSISTIVE DEVICES UTILIZED TO PREVENT FALLS:  Life alert? Yes  Use of a cane, walker or w/c? Yes  Grab bars in the bathroom? Yes  Shower chair or bench in shower? Yes  Elevated toilet seat or a handicapped toilet? No   TIMED UP AND GO:  Was the test performed? No .      Cognitive Function:     6CIT Screen 08/29/2021 08/29/2020 06/13/2019  What Year? 0 points 0 points 0 points  What month? 0 points 0 points 0 points  What time? 0 points 0 points 0 points  Count back from 20 0 points 2 points 0 points  Months in reverse 0 points 0 points 0 points  Repeat phrase 0 points 0 points 4 points  Total Score 0 2 4    Immunizations Immunization History  Administered Date(s) Administered   Influenza,inj,Quad PF,6+ Mos 06/21/2019   PFIZER Comirnaty(Gray Top)Covid-19 Tri-Sucrose Vaccine 03/25/2021   PFIZER(Purple Top)SARS-COV-2 Vaccination 02/02/2020, 03/13/2020, 09/25/2020    TDAP status: Due, Education has been provided regarding the importance of  this vaccine. Advised may receive this vaccine at local pharmacy or Health Dept. Aware to provide a copy of the vaccination record  if obtained from local pharmacy or Health Dept. Verbalized acceptance and understanding.  Flu Vaccine status: Due, Education has been provided regarding the importance of this vaccine. Advised may receive this vaccine at local pharmacy or Health Dept. Aware to provide a copy of the vaccination record if obtained from local pharmacy or Health Dept. Verbalized acceptance and understanding.  Pneumococcal vaccine status: Due, Education has been provided regarding the importance of this vaccine. Advised may receive this vaccine at local pharmacy or Health Dept. Aware to provide a copy of the vaccination record if obtained from local pharmacy or Health Dept. Verbalized acceptance and understanding.  Covid-19 vaccine status: Completed vaccines  Qualifies for Shingles Vaccine? Yes   Zostavax completed No   Shingrix Completed?: No.    Education has been provided regarding the importance of this vaccine. Patient has been advised to call insurance company to determine out of pocket expense if they have not yet received this vaccine. Advised may also receive vaccine at local pharmacy or Health Dept. Verbalized acceptance and understanding.  Screening Tests Health Maintenance  Topic Date Due   Pneumococcal Vaccine 66-68 Years old (1 - PCV) Never done   URINE MICROALBUMIN  Never done   TETANUS/TDAP  Never done   Zoster Vaccines- Shingrix (1 of 2) Never done   PAP SMEAR-Modifier  Never done   COLONOSCOPY (Pts 45-108yr Insurance coverage will need to be confirmed)  Never done   MAMMOGRAM  08/22/2016   INFLUENZA VACCINE  04/15/2021   COVID-19 Vaccine (5 - Booster for Pfizer series) 05/20/2021   Hepatitis C Screening  Completed   HIV Screening  Completed   HPV VACCINES  Aged Out    Health Maintenance  Health Maintenance Due  Topic Date Due   Pneumococcal Vaccine 153611 Years old (1 - PCV) Never done   URINE MICROALBUMIN  Never done   TETANUS/TDAP  Never done   Zoster Vaccines- Shingrix (1 of 2) Never done   PAP SMEAR-Modifier  Never done   COLONOSCOPY (Pts 45-463yrInsurance coverage will need to be confirmed)  Never done   MAMMOGRAM  08/22/2016   INFLUENZA VACCINE  04/15/2021   COVID-19 Vaccine (5 - Booster for PfSt. Croixeries) 05/20/2021    Colorectal cancer screening: Type of screening: Cologuard. Completed 07/03/2020. Repeat every 3 years  Mammogram status: patient to schedule  Bone Density status: n/a  Lung Cancer Screening: (Low Dose CT Chest recommended if Age 55-80ears, 30 pack-year currently smoking OR have quit w/in 15years.) does not qualify.   Lung Cancer Screening Referral: no  Additional Screening:  Hepatitis C Screening: does qualify; Completed 02/20/2020  Vision Screening: Recommended annual ophthalmology exams for early detection of glaucoma and other disorders of the eye. Is the patient up to date with their annual eye exam?  No  Who is the provider or what is the name of the office in which the patient attends annual eye exams? none If pt is not established with a provider, would they like to be referred to a provider to establish care? No .   Dental Screening: Recommended annual dental exams for proper oral hygiene  Community Resource Referral / Chronic Care Management: CRR required this visit?  No   CCM required this visit?  No      Plan:     I have personally reviewed and noted the following in the patients chart:   Medical and social history Use of alcohol, tobacco or illicit drugs  Current medications and supplements including opioid  prescriptions.  Functional ability and status Nutritional status Physical activity Advanced directives List of other physicians Hospitalizations, surgeries, and ER visits in previous 12 months Vitals Screenings to include cognitive, depression, and falls Referrals and  appointments  In addition, I have reviewed and discussed with patient certain preventive protocols, quality metrics, and best practice recommendations. A written personalized care plan for preventive services as well as general preventive health recommendations were provided to patient.     [name], LPN   [id number]   Nurse Notes: none

## 2021-08-29 NOTE — Patient Instructions (Signed)
Ms. [name] , Thank you for taking time to come for your Medicare Wellness Visit. I appreciate your ongoing commitment to your health goals. Please review the following plan we discussed and let me know if I can assist you in the future.   Screening recommendations/referrals: Colonoscopy: cologuard 07/03/2020 Mammogram: patient to schedule Bone Density: n/a Recommended yearly ophthalmology/optometry visit for glaucoma screening and checkup Recommended yearly dental visit for hygiene and checkup  Vaccinations: Influenza vaccine: due Pneumococcal vaccine: due Tdap vaccine: due Shingles vaccine: discussed  Covid-19:  03/25/2021, 1/07/17/2021, 03/13/2020, 02/02/2020  Advanced directives: Advance directive discussed with you today.   Conditions/risks identified: none  Next appointment: Follow up in one year for your annual wellness visit.   Preventive Care 40-64 Years, Female Preventive care refers to lifestyle choices and visits with your health care provider that can promote health and wellness. What does preventive care include? A yearly physical exam. This is also called an annual well check. Dental exams once or twice a year. Routine eye exams. Ask your health care provider how often you should have your eyes checked. Personal lifestyle choices, including: Daily care of your teeth and gums. Regular physical activity. Eating a healthy diet. Avoiding tobacco and drug use. Limiting alcohol use. Practicing safe sex. Taking low-dose aspirin daily starting at age 35. Taking vitamin and mineral supplements as recommended by your health care provider. What happens during an annual well check? The services and screenings done by your health care provider during your annual well check will depend on your age, overall health, lifestyle risk factors, and family history of disease. Counseling  Your health care provider may ask you questions about your: Alcohol use. Tobacco use. Drug  use. Emotional well-being. Home and relationship well-being. Sexual activity. Eating habits. Work and work Statistician. Method of birth control. Menstrual cycle. Pregnancy history. Screening  You may have the following tests or measurements: Height, weight, and BMI. Blood pressure. Lipid and cholesterol levels. These may be checked every 5 years, or more frequently if you are over 61 years old. Skin check. Lung cancer screening. You may have this screening every year starting at age 84 if you have a 30-pack-year history of smoking and currently smoke or have quit within the past 15 years. Fecal occult blood test (FOBT) of the stool. You may have this test every year starting at age 79. Flexible sigmoidoscopy or colonoscopy. You may have a sigmoidoscopy every 5 years or a colonoscopy every 10 years starting at age 49. Hepatitis C blood test. Hepatitis B blood test. Sexually transmitted disease (STD) testing. Diabetes screening. This is done by checking your blood sugar (glucose) after you have not eaten for a while (fasting). You may have this done every 1-3 years. Mammogram. This may be done every 1-2 years. Talk to your health care provider about when you should start having regular mammograms. This may depend on whether you have a family history of breast cancer. BRCA-related cancer screening. This may be done if you have a family history of breast, ovarian, tubal, or peritoneal cancers. Pelvic exam and Pap test. This may be done every 3 years starting at age 50. Starting at age 6, this may be done every 5 years if you have a Pap test in combination with an HPV test. Bone density scan. This is done to screen for osteoporosis. You may have this scan if you are at high risk for osteoporosis. Discuss your test results, treatment options, and if necessary, the need for more tests  with your health care provider. Vaccines  Your health care provider may recommend certain vaccines, such  as: Influenza vaccine. This is recommended every year. Tetanus, diphtheria, and acellular pertussis (Tdap, Td) vaccine. You may need a Td booster every 10 years. Zoster vaccine. You may need this after age 41. Pneumococcal 13-valent conjugate (PCV13) vaccine. You may need this if you have certain conditions and were not previously vaccinated. Pneumococcal polysaccharide (PPSV23) vaccine. You may need one or two doses if you smoke cigarettes or if you have certain conditions. Talk to your health care provider about which screenings and vaccines you need and how often you need them. This information is not intended to replace advice given to you by your health care provider. Make sure you discuss any questions you have with your health care provider. Document Released: 09/28/2015 Document Revised: 05/21/2016 Document Reviewed: 07/03/2015 Elsevier Interactive Patient Education  2017 [place] Prevention in the Home Falls can cause injuries. They can happen to people of all ages. There are many things you can do to make your home safe and to help prevent falls. What can I do on the outside of my home? Regularly fix the edges of walkways and driveways and fix any cracks. Remove anything that might make you trip as you walk through a door, such as a raised step or threshold. Trim any bushes or trees on the path to your home. Use bright outdoor lighting. Clear any walking paths of anything that might make someone trip, such as rocks or tools. Regularly check to see if handrails are loose or broken. Make sure that both sides of any steps have handrails. Any raised decks and porches should have guardrails on the edges. Have any leaves, snow, or ice cleared regularly. Use sand or salt on walking paths during winter. Clean up any spills in your garage right away. This includes oil or grease spills. What can I do in the bathroom? Use night lights. Install grab bars by the toilet and in  the tub and shower. Do not use towel bars as grab bars. Use non-skid mats or decals in the tub or shower. If you need to sit down in the shower, use a plastic, non-slip stool. Keep the floor dry. Clean up any water that spills on the floor as soon as it happens. Remove soap buildup in the tub or shower regularly. Attach bath mats securely with double-sided non-slip rug tape. Do not have throw rugs and other things on the floor that can make you trip. What can I do in the bedroom? Use night lights. Make sure that you have a light by your bed that is easy to reach. Do not use any sheets or blankets that are too big for your bed. They should not hang down onto the floor. Have a firm chair that has side arms. You can use this for support while you get dressed. Do not have throw rugs and other things on the floor that can make you trip. What can I do in the kitchen? Clean up any spills right away. Avoid walking on wet floors. Keep items that you use a lot in easy-to-reach places. If you need to reach something above you, use a strong step stool that has a grab bar. Keep electrical cords out of the way. Do not use floor polish or wax that makes floors slippery. If you must use wax, use non-skid floor wax. Do not have throw rugs and other things on the  floor that can make you trip. What can I do with my stairs? Do not leave any items on the stairs. Make sure that there are handrails on both sides of the stairs and use them. Fix handrails that are broken or loose. Make sure that handrails are as long as the stairways. Check any carpeting to make sure that it is firmly attached to the stairs. Fix any carpet that is loose or worn. Avoid having throw rugs at the top or bottom of the stairs. If you do have throw rugs, attach them to the floor with carpet tape. Make sure that you have a light switch at the top of the stairs and the bottom of the stairs. If you do not have them, ask someone to add them  for you. What else can I do to help prevent falls? Wear shoes that: Do not have high heels. Have rubber bottoms. Are comfortable and fit you well. Are closed at the toe. Do not wear sandals. If you use a stepladder: Make sure that it is fully opened. Do not climb a closed stepladder. Make sure that both sides of the stepladder are locked into place. Ask someone to hold it for you, if possible. Clearly mark and make sure that you can see: Any grab bars or handrails. First and last steps. Where the edge of each step is. Use tools that help you move around (mobility aids) if they are needed. These include: Canes. Walkers. Scooters. Crutches. Turn on the lights when you go into a dark area. Replace any light bulbs as soon as they burn out. Set up your furniture so you have a clear path. Avoid moving your furniture around. If any of your floors are uneven, fix them. If there are any pets around you, be aware of where they are. Review your medicines with your doctor. Some medicines can make you feel dizzy. This can increase your chance of falling. Ask your doctor what other things that you can do to help prevent falls. This information is not intended to replace advice given to you by your health care provider. Make sure you discuss any questions you have with your health care provider. Document Released: 06/28/2009 Document Revised: 02/07/2016 Document Reviewed: 10/06/2014 Elsevier Interactive Patient Education  2017 [employer].

## 2021-09-02 ENCOUNTER — Other Ambulatory Visit: Payer: Self-pay

## 2021-09-02 MED ORDER — FREESTYLE LITE TEST VI STRP: ORAL_STRIP | 5 refills | Status: AC

## 2021-09-11 ENCOUNTER — Telehealth: Payer: Self-pay | Admitting: *Deleted

## 2021-09-11 ENCOUNTER — Other Ambulatory Visit: Payer: Self-pay

## 2021-09-11 ENCOUNTER — Encounter: Payer: Self-pay | Admitting: Nurse Practitioner

## 2021-09-11 ENCOUNTER — Ambulatory Visit (INDEPENDENT_AMBULATORY_CARE_PROVIDER_SITE_OTHER): Payer: BC Managed Care – PPO | Admitting: Nurse Practitioner

## 2021-09-11 VITALS — BP 130/82 | HR 95 | Temp 98.2°F

## 2021-09-11 DIAGNOSIS — M7989 Other specified soft tissue disorders: Secondary | ICD-10-CM

## 2021-09-11 DIAGNOSIS — E559 Vitamin D deficiency, unspecified: Secondary | ICD-10-CM | POA: Diagnosis not present

## 2021-09-11 DIAGNOSIS — Z0000 Encounter for general adult medical examination without abnormal findings: Secondary | ICD-10-CM | POA: Diagnosis not present

## 2021-09-11 DIAGNOSIS — E782 Mixed hyperlipidemia: Secondary | ICD-10-CM

## 2021-09-11 DIAGNOSIS — Z1159 Encounter for screening for other viral diseases: Secondary | ICD-10-CM | POA: Diagnosis not present

## 2021-09-11 DIAGNOSIS — E1165 Type 2 diabetes mellitus with hyperglycemia: Secondary | ICD-10-CM

## 2021-09-11 DIAGNOSIS — E663 Overweight: Secondary | ICD-10-CM

## 2021-09-11 DIAGNOSIS — Z23 Encounter for immunization: Secondary | ICD-10-CM | POA: Diagnosis not present

## 2021-09-11 LAB — POCT URINALYSIS DIPSTICK
Bilirubin, UA: NEGATIVE
Glucose, UA: NEGATIVE
Ketones, UA: NEGATIVE
Leukocytes, UA: NEGATIVE
Nitrite, UA: NEGATIVE
Protein, UA: NEGATIVE
Spec Grav, UA: 1.015 (ref 1.010–1.025)
Urobilinogen, UA: 0.2 E.U./dL
pH, UA: 7 (ref 5.0–8.0)

## 2021-09-11 LAB — POCT UA - MICROALBUMIN
Albumin/Creatinine Ratio, Urine, POC: <30
Creatinine, POC: 50 mg/dL
Microalbumin Ur, POC: 10 mg/L

## 2021-09-11 MED ORDER — TETANUS-DIPHTH-ACELL PERTUSSIS 5-2.5-18.5 LF-MCG/0.5 IM SUSP: 0.5000 mL | Freq: Once | INTRAMUSCULAR | 0 refills | Status: AC

## 2021-09-11 NOTE — Patient Instructions (Signed)

## 2021-09-11 NOTE — Chronic Care Management (AMB) (Signed)
°  Care Management   Note  09/11/2021 Name: [name] MRN: [id number] DOB: [birth year]  [name] is a 55 y.o. year old female who is a primary care patient of [name], [place] and is actively engaged with the care management team. I reached out to [name] by phone today to assist with re-scheduling a follow up visit with the RN Case Manager  Follow up plan: Unsuccessful telephone outreach attempt made. A HIPAA compliant phone message was left for the patient providing contact information and requesting a return call.  The care management team will reach out to the patient again over the next 7 days.  If patient returns call to provider office, please advise to call [place] at [phone].  [place] Management  Direct Dial: [phone]

## 2021-09-11 NOTE — Progress Notes (Signed)
I,Tianna Badgett,acting as a Education administrator for [employer], NP.,have documented all relevant documentation on the behalf of [employer], NP,as directed by  [name], NP while in the presence of [name], NP.  This visit occurred during the SARS-CoV-2 public health emergency.  Safety protocols were in place, including screening questions prior to the visit, additional usage of staff PPE, and extensive cleaning of exam room while observing appropriate contact time as indicated for disinfecting solutions.  Subjective:     Patient ID: [name] , female    DOB: [birth year] , 55 y.o.   MRN: [id number]  Chief Complaint  Patient presents with   Annual Exam    HPI  Patient is here for hm.  Her BS is 120.  She has MS. She sees them every 4-5 months. She is interested in taking either the ozempic or rybelsus but will look at her Hgb A1c first.  She is going to wait until she sees her OBGYN for paps smear and mammogram. She does have an IUD x 6 years which needs to be taken out.  Diet: she is trying to eat healthy   Wt Readings from Last 3 Encounters:  08/29/21 : 285 lb (129.3 kg) 09/18/20 : 290 lb (131.5 kg) 08/29/20 : 290 lb (131.5 kg) She lost 5 pounds  She is getting physical therapy twice week. She is mostly in her wheelchair and also using her walker to walk due to her MS.  Drink or smoke: NO  Sexually active: No  She will go see a optometrist and she goes to the dentist.     Past Medical History:  Diagnosis Date   Movement disorder    Multiple sclerosis ([place])      Family History  Problem Relation Age of Onset   Hypertension Mother    Hypertension Father    Diabetes Father      Current Outpatient Medications:    Blood Glucose Monitoring Suppl (FREESTYLE LITE) w/Device KIT, Inject 1 each into the skin 2 (two) times daily. dxE11.65, Disp: 1 kit, Rfl: 1   Cholecalciferol (VITAMIN D3) 50 MCG (2000 UT) TABS, TAKE 1 TABLET BY MOUTH EVERY DAY OR AS  DIRECTED, Disp: 90 tablet, Rfl: 3   CVS B12 QUICK DISSOLVE 500 MCG LOZG, TAKE 2 TABS BY MOUTH DAILY, Disp: 180 lozenge, Rfl: 1   Tdap (BOOSTRIX) 5-2.5-18.5 LF-MCG/0.5 injection, Inject 0.5 mLs into the muscle once for 1 dose., Disp: 0.5 mL, Rfl: 0   Dimethyl Fumarate 240 MG CPDR, Take 240 mg by mouth 2 (two) times daily., Disp: , Rfl:    glucose blood (FREESTYLE LITE) test strip, Use as instructed to check blood sugars 2 times per day dx:e11.65, Disp: 100 each, Rfl: 5   ketoconazole (NIZORAL) 2 % cream, Apply 1 application topically daily. (Patient not taking: Reported on 08/29/2021), Disp: 15 g, Rfl: 1   metFORMIN (GLUCOPHAGE) 500 MG tablet, Take 1 tablet (500 mg total) by mouth 2 (two) times daily with a meal., Disp: 180 tablet, Rfl: 1   Microlet Lancets MISC, Use as directed to check blood sugars 2 times per day dx: e11.65, Disp: 100 each, Rfl: 11   Probiotic Product (PROBIOTIC PO), Take 1 Dose by mouth daily., Disp: , Rfl:    Allergies  Allergen Reactions   Food Other (See Comments)    WHAT FOOD CAUSES THIS REACTION? Eyes swelling and throat swelling   Oxycodone Hcl Hives   Soy Allergy     Other reaction(s): SWELLING  The patient states she uses none for birth control. Last LMP was No LMP recorded. (Menstrual status: IUD).. Negative for Dysmenorrhea. Negative for: breast discharge, breast lump(s), breast pain and breast self exam. Associated symptoms include abnormal vaginal bleeding. Pertinent negatives include abnormal bleeding (hematology), anxiety, decreased libido, depression, difficulty falling sleep, dyspareunia, history of infertility, nocturia, sexual dysfunction, sleep disturbances, urinary incontinence, urinary urgency, vaginal discharge and vaginal itching. Diet regular.The patient states her exercise level is    . The patient's tobacco use is:  Social History   Tobacco Use  Smoking Status Never  Smokeless Tobacco Never  She has been exposed to passive smoke. The  patient's alcohol use is:  Social History   Substance and Sexual Activity  Alcohol Use No  . Additional information: Last pap , next one scheduled for 2023.    Review of Systems  Constitutional: Negative.  Negative for chills and unexpected weight change.  HENT: Negative.  Negative for congestion, rhinorrhea and sinus pain.   Eyes: Negative.   Respiratory: Negative.  Negative for cough, shortness of breath and wheezing.   Cardiovascular: Negative.  Negative for chest pain and palpitations.  Gastrointestinal: Negative.  Negative for constipation and diarrhea.  Endocrine: Negative.  Negative for polydipsia, polyphagia and polyuria.  Genitourinary: Negative.   Musculoskeletal:  Positive for myalgias.  Skin: Negative.   Allergic/Immunologic: Negative.   Neurological:  Positive for weakness. Negative for dizziness and headaches.       Weakness to her legs due to MS   Hematological: Negative.   Psychiatric/Behavioral: Negative.      Today's Vitals   09/11/21 1415  BP: 130/82  Pulse: 95  Temp: 98.2 F (36.8 C)  TempSrc: Oral   There is no height or weight on file to calculate BMI.   Objective:  Physical Exam Vitals and nursing note reviewed.  Constitutional:      Appearance: Normal appearance. She is obese.  HENT:     Head: Normocephalic and atraumatic.     Right Ear: Tympanic membrane, ear canal and external ear normal. There is no impacted cerumen.     Left Ear: Tympanic membrane, ear canal and external ear normal. There is no impacted cerumen.     Nose: Nose normal. No congestion or rhinorrhea.     Mouth/Throat:     Mouth: Mucous membranes are moist.     Pharynx: Oropharynx is clear.  Eyes:     Extraocular Movements: Extraocular movements intact.     Conjunctiva/sclera: Conjunctivae normal.     Pupils: Pupils are equal, round, and reactive to light.  Cardiovascular:     Rate and Rhythm: Normal rate and regular rhythm.     Pulses: Normal pulses.     Heart sounds:  Normal heart sounds. No murmur heard. Pulmonary:     Effort: Pulmonary effort is normal. No respiratory distress.     Breath sounds: Normal breath sounds. No wheezing.  Chest:     Comments: Refused breast exam Abdominal:     General: Abdomen is flat. Bowel sounds are normal.     Palpations: Abdomen is soft.  Genitourinary:    Comments: Refused  Musculoskeletal:        General: Swelling present. No tenderness. Normal range of motion.     Cervical back: Normal range of motion and neck supple.     Right lower leg: Edema present.     Left lower leg: Edema present.  Skin:    General: Skin is warm and dry.  Capillary Refill: Capillary refill takes less than 2 seconds.  Neurological:     General: No focal deficit present.     Mental Status: She is alert and oriented to person, place, and time.  Psychiatric:        Mood and Affect: Mood normal.        Behavior: Behavior normal.        Assessment And Plan:     1. Encounter for annual physical exam -Patient is here for their annual physical exam and we discussed any changes to medication and medical history.  -Behavior modification was discussed as well as diet and exercise history  -Patient will continue to exercise regularly and modify their diet.  -Recommendation for yearly physical annuals, immunization and screenings including mammogram and colonoscopy were discussed with the patient.  -Recommended intake of multivitamin, vitamin D and calcium.  -Individualized advise was given to the patient pertaining to their own health history in regards to diet, exercise, medical condition and referrals.   2. Type 2 diabetes mellitus with hyperglycemia, without long-term current use of insulin (HCC) -Chronic, continue meds -Will check Hgb A1c and will consider adding ozempic or rybelsus  - POCT Urinalysis Dipstick (81002) - POCT UA - Microalbumin - EKG 12-Lead - CBC - Hemoglobin A1c - CMP14+EGFR  3. Mixed hyperlipidemia --Educated  patient about a diet that is low in fat and high fatty foods including dairy products. Increase in take of fish and fiber. Decrease intake of red meats and fast foods. Exercise for atleast 4-5 times a week or atleast 30-45 min. Drink a lot of water.   - Lipid panel  4. Vitamin D deficiency -Will check and supplement if needed. Advised patient to spend atleast 15 min. Daily in sunlight.  - Vitamin D (25 hydroxy)  5. Leg swelling - EKG 12-Lead - CMP14+EGFR- NSR   6. Need for hepatitis C screening test - Hepatitis C antibody  7. Need for influenza vaccination - Flu Vaccine QUAD 6+ mos PF IM (Fluarix Quad PF)  8. Need for Tdap vaccination - Tdap ([place]) 5-2.5-18.5 LF-MCG/0.5 injection; Inject 0.5 mLs into the muscle once for 1 dose.  Dispense: 0.5 mL; Refill: 0  9. Overweight -Advised patient on a healthy diet including avoiding fast food and red meats. Increase the intake of lean meats including grilled chicken and [place].  Drink a lot of water. Decrease intake of fatty foods. Exercise for 30-45 min. 4-5 a week to decrease the risk of cardiac event.   The patient was encouraged to call or send a message through [place] for any questions or concerns.   Follow up: if symptoms persist or do not get better.   Side effects and appropriate use of all the medication(s) were discussed with the patient today. Patient advised to use the medication(s) as directed by their healthcare provider. The patient was encouraged to read, review, and understand all associated package inserts and contact our office with any questions or concerns. The patient accepts the risks of the treatment plan and had an opportunity to ask questions.   Patient was given opportunity to ask questions. Patient verbalized understanding of the plan and was able to repeat key elements of the plan. All questions were answered to their satisfaction.  Raman Nitisha Civello, DNP   I, Raman Blandon Offerdahl have reviewed all documentation for this  visit. The documentation on 01/24/21 for the exam, diagnosis, procedures, and orders are all accurate and complete.    THE PATIENT IS ENCOURAGED TO PRACTICE SOCIAL  DISTANCING DUE TO THE COVID-19 PANDEMIC.

## 2021-09-12 LAB — LIPID PANEL
Chol/HDL Ratio: 3.4 ratio (ref 0.0–4.4)
Cholesterol, Total: 175 mg/dL (ref 100–199)
HDL: 52 mg/dL (ref >39)
LDL Chol Calc (NIH): 112 mg/dL — ABNORMAL HIGH (ref 0–99)
Triglycerides: 58 mg/dL (ref 0–149)
VLDL Cholesterol Cal: 11 mg/dL (ref 5–40)

## 2021-09-12 LAB — CBC
Hematocrit: 44.9 % (ref 34.0–46.6)
Hemoglobin: 15 g/dL (ref 11.1–15.9)
MCH: 29.3 pg (ref 26.6–33.0)
MCHC: 33.4 g/dL (ref 31.5–35.7)
MCV: 88 fL (ref 79–97)
Platelets: 354 10*3/uL (ref 150–450)
RBC: 5.12 x10E6/uL (ref 3.77–5.28)
RDW: 13.2 % (ref 11.7–15.4)
WBC: 11.7 10*3/uL — ABNORMAL HIGH (ref 3.4–10.8)

## 2021-09-12 LAB — CMP14+EGFR
ALT: 21 IU/L (ref 0–32)
AST: 17 IU/L (ref 0–40)
Albumin/Globulin Ratio: 1.7 (ref 1.2–2.2)
Albumin: 5.1 g/dL — ABNORMAL HIGH (ref 3.8–4.9)
Alkaline Phosphatase: 72 IU/L (ref 44–121)
BUN/Creatinine Ratio: 14 (ref 9–23)
BUN: 10 mg/dL (ref 6–24)
Bilirubin Total: 0.5 mg/dL (ref 0.0–1.2)
CO2: 26 mmol/L (ref 20–29)
Calcium: 9.9 mg/dL (ref 8.7–10.2)
Chloride: 101 mmol/L (ref 96–106)
Creatinine, Ser: 0.74 mg/dL (ref 0.57–1.00)
Globulin, Total: 3 g/dL (ref 1.5–4.5)
Glucose: 94 mg/dL (ref 70–99)
Potassium: 4.1 mmol/L (ref 3.5–5.2)
Sodium: 142 mmol/L (ref 134–144)
Total Protein: 8.1 g/dL (ref 6.0–8.5)
eGFR: 95 mL/min/{1.73_m2} (ref >59)

## 2021-09-12 LAB — VITAMIN D 25 HYDROXY (VIT D DEFICIENCY, FRACTURES): Vit D, 25-Hydroxy: 69 ng/mL (ref 30.0–100.0)

## 2021-09-12 LAB — HEPATITIS C ANTIBODY: Hep C Virus Ab: <0.1 s/co ratio (ref 0.0–0.9)

## 2021-09-12 LAB — HEMOGLOBIN A1C
Est. average glucose Bld gHb Est-mCnc: 131 mg/dL
Hgb A1c MFr Bld: 6.2 % — ABNORMAL HIGH (ref 4.8–5.6)

## 2021-09-17 ENCOUNTER — Telehealth: Payer: BC Managed Care – PPO

## 2021-09-17 NOTE — Chronic Care Management (AMB) (Signed)
°  Care Management   Note  09/17/2021 Name: [name] MRN: [id number] DOB: Feb 26, 1966  [name] is a 56 y.o. year old female who is a primary care patient of [name], [place] and is actively engaged with the care management team. I reached out to [name] by phone today to assist with re-scheduling a follow up visit with the RN Case Manager  Follow up plan: Telephone appointment with care management team member scheduled for:10/22/21  [place] [facility]  Direct Dial: [phone]

## 2021-09-19 ENCOUNTER — Telehealth: Payer: Self-pay

## 2021-09-19 NOTE — Progress Notes (Cosign Needed)
° ° °  Chronic Care Management Pharmacy Assistant   Name: [name]  MRN: [id number] DOB: [birth year]   Reason for Encounter: Disease State/ Diabetes Adherence Call    Recent office visits:   09/11/2021 [name] NT (CCM)  09/11/2021 Christina Castilla NP (PCP) Start Tetanus-Diphth-Acell Pertussis 5-2.5-18.5 LF-MCG/0.99mls Intramuscular Once   08/29/2021 Christina Durand LPN (PCP) Medicare Wellness Exam   Recent consult visits: None  Hospital visits:  None in previous 6 months  Medications: Outpatient Encounter Medications as of 09/19/2021  Medication Sig Note   Blood Glucose Monitoring Suppl (FREESTYLE LITE) w/Device KIT Inject 1 each into the skin 2 (two) times daily. dxE11.65    Cholecalciferol (VITAMIN D3) 50 MCG (2000 UT) TABS TAKE 1 TABLET BY MOUTH EVERY DAY OR AS DIRECTED    CVS B12 QUICK DISSOLVE 500 MCG LOZG TAKE 2 TABS BY MOUTH DAILY    Dimethyl Fumarate 240 MG CPDR Take 240 mg by mouth 2 (two) times daily. 11/06/2020: CaseId:[id number];Status:Approved;Review Type:Prior Auth;Coverage Start Date:10/07/2020;Coverage End Date:11/06/2021;   glucose blood (FREESTYLE LITE) test strip Use as instructed to check blood sugars 2 times per day dx:e11.65    ketoconazole (NIZORAL) 2 % cream Apply 1 application topically daily. (Patient not taking: Reported on 08/29/2021)    metFORMIN (GLUCOPHAGE) 500 MG tablet Take 1 tablet (500 mg total) by mouth 2 (two) times daily with a meal.    Microlet Lancets MISC Use as directed to check blood sugars 2 times per day dx: e11.65    Probiotic Product (PROBIOTIC PO) Take 1 Dose by mouth daily.    No facility-administered encounter medications on file as of 09/19/2021.   Recent Relevant Labs: Lab Results  Component Value Date/Time   HGBA1C 6.2 (H) 09/11/2021 03:27 PM   HGBA1C 11.8 (H) 05/01/2021 04:38 PM   MICROALBUR 10 09/11/2021 04:27 PM    Kidney Function Lab Results  Component Value Date/Time   CREATININE 0.74 09/11/2021 03:27 PM    CREATININE 0.71 05/01/2021 04:38 PM   GFRNONAA 84 02/20/2020 04:05 PM   GFRAA 97 02/20/2020 04:05 PM     Called patient 09/19/2021 left message Called patient 1/10 /2023 left message Called patient 1/11 /2023 left message  Care Gaps: Pneumonia Vaccine Urine Microalbumin TDAP Shingrix Vaccine PAP Smear - Modifier Colonoscopy Mammogram Influenza Vaccine COVID-19 Vaccine Booster   Star Rating Drugs:  Medication:  Last Fill: Day Supply  Metformin 500 mg tablet            11/11, 12/13     30 DS   [facility] Clinical Pharmacist Assistant [phone]

## 2021-09-20 ENCOUNTER — Telehealth: Payer: Self-pay

## 2021-09-20 NOTE — Chronic Care Management (AMB) (Signed)
° ° °  Chronic Care Management Pharmacy Assistant   Name: [name]  MRN: [id number] DOB: 1966/03/22  Reason for Encounter: Medication Review/ Medication coordination  Recent office visits:  08-29-2021 [name], LPN. Medicare wellness visit.   09-11-2021 [name], NP. Tdap ordered. Patient reported not taking Atorvastatin, farxiga, tumeric and nystatin. Flu vaccine given.  Recent consult visits:  None  Hospital visits:  None in previous 6 months  Medications: Outpatient Encounter Medications as of 09/20/2021  Medication Sig Note   Blood Glucose Monitoring Suppl (FREESTYLE LITE) w/Device KIT Inject 1 each into the skin 2 (two) times daily. dxE11.65    Cholecalciferol (VITAMIN D3) 50 MCG (2000 UT) TABS TAKE 1 TABLET BY MOUTH EVERY DAY OR AS DIRECTED    CVS B12 QUICK DISSOLVE 500 MCG LOZG TAKE 2 TABS BY MOUTH DAILY    Dimethyl Fumarate 240 MG CPDR Take 240 mg by mouth 2 (two) times daily. 11/06/2020: CaseId:[id number];Status:Approved;Review Type:Prior Auth;Coverage Start Date:10/07/2020;Coverage End Date:11/06/2021;   glucose blood (FREESTYLE LITE) test strip Use as instructed to check blood sugars 2 times per day dx:e11.65    ketoconazole (NIZORAL) 2 % cream Apply 1 application topically daily. (Patient not taking: Reported on 08/29/2021)    metFORMIN (GLUCOPHAGE) 500 MG tablet Take 1 tablet (500 mg total) by mouth 2 (two) times daily with a meal.    Microlet Lancets MISC Use as directed to check blood sugars 2 times per day dx: e11.65    Probiotic Product (PROBIOTIC PO) Take 1 Dose by mouth daily.    No facility-administered encounter medications on file as of 09/20/2021.  Reviewed chart for medication changes ahead of medication coordination call.  No OVs, Consults, since last care coordination call/Pharmacist visit.   BP Readings from Last 3 Encounters:  09/11/21 130/82  05/01/21 132/80  09/09/20 140/73    Lab Results  Component Value Date   HGBA1C 6.2 (H)  09/11/2021     Patient obtains medications through V[facility]  30 Days   Last adherence delivery included:  Metformin 500 mg- One tablet twice daily with meals.  Patient declined (meds) last month: Farxiga 10 mg- Discontinued Atorvastatin 10 mg- Discontinued  Patient is due for next adherence delivery on: 10-02-2021  Called patient and reviewed medications and coordinated delivery.  This delivery to include: Metformin 500 mg- One tablet twice daily with meals. Freestyle lite strips  No short/acute fill needed  Patient declined the following medications: Metformin 500 mg- One tablet twice daily with meals. Patient states she has a 30 day bottle on hand. Freestyle lite strips- Patient states she has plenty of supply to last until next fill.  Patient needs refills for: None  NOTES: Informed pharmacist Chasity that patient doesn't need medications this month.  Care Gaps: PAP smear overdue AWV 08-29-2021  Star Rating Drugs: Metformin 500 mg- Last filled 08-27-2021 30 DS [facility]  [place] Clinical Pharmacist Assistant [phone]

## 2021-10-03 ENCOUNTER — Ambulatory Visit (INDEPENDENT_AMBULATORY_CARE_PROVIDER_SITE_OTHER): Payer: BC Managed Care – PPO

## 2021-10-03 DIAGNOSIS — E782 Mixed hyperlipidemia: Secondary | ICD-10-CM

## 2021-10-03 DIAGNOSIS — G35 Multiple sclerosis: Secondary | ICD-10-CM

## 2021-10-03 DIAGNOSIS — E1165 Type 2 diabetes mellitus with hyperglycemia: Secondary | ICD-10-CM

## 2021-10-03 NOTE — Chronic Care Management (AMB) (Signed)
Chronic Care Management    Social Work Note  10/03/2021 Name: [name] MRN: [id number] DOB: October 07, 1965  [name] is a 56 y.o. year old female who is a primary care patient of [name], [place]. The CCM team was consulted to assist the patient with chronic disease management and/or care coordination needs related to:  MS, DM II, CKD I .   Engaged with patient by telephone for follow up visit in response to provider referral for social work chronic care management and care coordination services.   Consent to Services:  The patient was given information about Chronic Care Management services, agreed to services, and gave verbal consent prior to initiation of services.  Please see initial visit note for detailed documentation.   Patient agreed to services and consent obtained.   Assessment: Review of patient past medical history, allergies, medications, and health status, including review of relevant consultants reports was performed today as part of a comprehensive evaluation and provision of chronic care management and care coordination services.     SDOH (Social Determinants of Health) assessments and interventions performed:  SDOH Interventions    Flowsheet Row Most Recent Value  SDOH Interventions   Food Insecurity Interventions Intervention Not Indicated  Housing Interventions Intervention Not Indicated  Transportation Interventions Intervention Not Indicated        Advanced Directives Status: Not addressed in this encounter.  CCM Care Plan  Allergies  Allergen Reactions   Food Other (See Comments)    WHAT FOOD CAUSES THIS REACTION? Eyes swelling and throat swelling   Oxycodone Hcl Hives   Soy Allergy     Other reaction(s): SWELLING    Outpatient Encounter Medications as of 10/03/2021  Medication Sig Note   Blood Glucose Monitoring Suppl (FREESTYLE LITE) w/Device KIT Inject 1 each into the skin 2 (two) times daily. dxE11.65    Cholecalciferol (VITAMIN  D3) 50 MCG (2000 UT) TABS TAKE 1 TABLET BY MOUTH EVERY DAY OR AS DIRECTED    CVS B12 QUICK DISSOLVE 500 MCG LOZG TAKE 2 TABS BY MOUTH DAILY    Dimethyl Fumarate 240 MG CPDR Take 240 mg by mouth 2 (two) times daily. 11/06/2020: CaseId:[id number];Status:Approved;Review Type:Prior Auth;Coverage Start Date:10/07/2020;Coverage End Date:11/06/2021;   glucose blood (FREESTYLE LITE) test strip Use as instructed to check blood sugars 2 times per day dx:e11.65    ketoconazole (NIZORAL) 2 % cream Apply 1 application topically daily. (Patient not taking: Reported on 08/29/2021)    metFORMIN (GLUCOPHAGE) 500 MG tablet Take 1 tablet (500 mg total) by mouth 2 (two) times daily with a meal.    Microlet Lancets MISC Use as directed to check blood sugars 2 times per day dx: e11.65    Probiotic Product (PROBIOTIC PO) Take 1 Dose by mouth daily.    No facility-administered encounter medications on file as of 10/03/2021.    Patient Active Problem List   Diagnosis Date Noted   Edema 08/29/2020   Lymphedema 08/02/2020   Stress 08/02/2020   Gait abnormality 08/02/2020   Acute urinary tract infection 10/28/2018   High risk medication use 10/21/2016   Urinary frequency 04/22/2016   Multiple sclerosis ([place]) 02/01/2015   Gait disturbance 02/01/2015   Left foot drop 02/01/2015   Other fatigue 02/01/2015   DS (disseminated sclerosis) ([place]) 01/06/2013   Avitaminosis D 01/06/2013    Conditions to be addressed/monitored: DMII, CKD Stage I, and MS  Care Plan : Social Work Care Plan  Updates made by Daneen Schick since 10/03/2021 12:00 AM  Completed 10/03/2021   Problem: Quality of Life (General Plan of Care) Resolved 10/03/2021     Long-Range Goal: Quality of Life Maintained Completed 10/03/2021  Start Date: 11/08/2020  Recent Progress: On track  Priority: Low  Note:   Current Barriers:  Chronic disease management support and education needs related to  Multiple Sclerosis and Vitamin B12 Deficiency   ADL IADL  limitations  Social Worker Clinical Goal(s):  patient will work with SW to identify and address any acute and/or chronic care coordination needs related to the self health management of  Multiple Sclerosis and Vitamin B12 Deficiency    CCM SW Interventions:  Inter-disciplinary care team collaboration (see longitudinal plan of care) Collaboration with [name], FNP regarding development and update of comprehensive plan of care as evidenced by provider attestation and co-signature Telephonic visit completed with the patient to assess for care coordination needs Determined the patient continues to receive in home physical therapy twice weekly Discussed patient has been taking Metformin and checking fasting blood sugars daily - patient happy to report A1C is down to 6.2 Encouraged patient to continue adhering to provider recommendation regarding management or pre-diabetes Assessed for SDoH needs - patient denies acute needs. Reports she is awaiting estimates on a walk in shower for the downstairs Encouraged the patient to contact SW as needed  Patient Goals/Self-Care Activities patient will:   -  Engage with RN Care Manager to address care management needs -Contact SW as needed       Follow Up Plan:  No SW follow up planned at this time. The patient will remain engaged with RN Care Manager.      Daneen Schick, BSW, CDP Social Worker, Certified Dementia Practitioner [place] [facility] [place] [facility] [phone]

## 2021-10-03 NOTE — Patient Instructions (Signed)
Social Worker Visit Information  Goals we discussed today:   Goals Addressed             This Visit's Progress    COMPLETED: Quality of life maintained       Timeframe:  Long-Range Goal Priority:  Low Start Date:  2.24.22          Patient Goals/Self-Care Activities patient will:   - Engage with RN Care Manager to address care management needs -Contact SW as needed          Patient verbalizes understanding of instructions and care plan provided today and agrees to view in [facility]. Active MyChart status confirmed with patient.    Follow Up Plan:  No follow up planned at this time. Please contact me as needed.   Daneen Schick, BSW, CDP Social Worker, Certified Dementia Practitioner [place] [facility] [place] [facility] [phone]

## 2021-10-13 ENCOUNTER — Other Ambulatory Visit: Payer: Self-pay | Admitting: Neurology

## 2021-10-14 ENCOUNTER — Telehealth: Payer: Self-pay

## 2021-10-14 NOTE — Chronic Care Management (AMB) (Addendum)
° ° °  Chronic Care Management Pharmacy Assistant   Name: SHY GUALLPA  MRN: [id number] DOB: February 15, 1966  Reason for Encounter: No fill report   Medications: Outpatient Encounter Medications as of 10/14/2021  Medication Sig Note   Blood Glucose Monitoring Suppl (FREESTYLE LITE) w/Device KIT Inject 1 each into the skin 2 (two) times daily. dxE11.65    Cholecalciferol (VITAMIN D3) 50 MCG (2000 UT) TABS TAKE 1 TABLET BY MOUTH EVERY DAY OR AS DIRECTED    [facility] B12 QUICK DISSOLVE 500 MCG LOZG TAKE 2 TABS BY MOUTH DAILY    Dimethyl Fumarate 240 MG CPDR Take 240 mg by mouth 2 (two) times daily. 11/06/2020: CaseId:[id number];Status:Approved;Review Type:Prior Auth;Coverage Start Date:10/07/2020;Coverage End Date:11/06/2021;   glucose blood (FREESTYLE LITE) test strip Use as instructed to check blood sugars 2 times per day dx:e11.65    ketoconazole (NIZORAL) 2 % cream Apply 1 application topically daily. (Patient not taking: Reported on 08/29/2021)    metFORMIN (GLUCOPHAGE) 500 MG tablet Take 1 tablet (500 mg total) by mouth 2 (two) times daily with a meal.    Microlet Lancets MISC Use as directed to check blood sugars 2 times per day dx: e11.65    Probiotic Product (PROBIOTIC PO) Take 1 Dose by mouth daily.    No facility-administered encounter medications on file as of 10/14/2021.    10-14-2021: Patient is on the no fill report for  Freestyle lite strips. Patient declined strips on 09-20-2021 for delivery due to having plenty of supply. Updated report.   10-15-2021: appointment reminder  Danniela A Roa was reminded to have all medications, supplements and any blood glucose and blood pressure readings available for review with [name], Pharm. D, at her telephone visit on 10-18-2021 at 2:00.   Questions: Have you had any recent office visit or specialist visit outside of [place]? Patient stated no  Are there any concerns you would like to discuss during your office visit?  Patient stated should she discontinue metformin since last A1C was 6.2. Patient has decreased to one tablet every morning.  Are you having any problems obtaining your medications? (Whether it pharmacy issues or cost) Patient stated no   If patient has any PAP medications ask if they are having any problems getting their PAP medication or refill?  Care Gaps: PAP SMEAR overdue Covid booster overdue AWV 09-02-2022  Star Rating Drug: Metformin 500 mg- Last filled 08-27-2021 30 [facility] ( Patient stated she has supply on hand)  [place] Pharmacist Assistant [phone]

## 2021-10-15 DIAGNOSIS — E782 Mixed hyperlipidemia: Secondary | ICD-10-CM

## 2021-10-15 DIAGNOSIS — E1165 Type 2 diabetes mellitus with hyperglycemia: Secondary | ICD-10-CM

## 2021-10-18 ENCOUNTER — Ambulatory Visit (INDEPENDENT_AMBULATORY_CARE_PROVIDER_SITE_OTHER): Payer: BC Managed Care – PPO

## 2021-10-18 DIAGNOSIS — E1165 Type 2 diabetes mellitus with hyperglycemia: Secondary | ICD-10-CM

## 2021-10-18 DIAGNOSIS — G35 Multiple sclerosis: Secondary | ICD-10-CM

## 2021-10-18 NOTE — Progress Notes (Signed)
Chronic Care Management Pharmacy Note  10/30/2021 Name:  [name] MRN:  [id number] DOB:  13-Feb-1966  Summary: Patient reports that she has started doing more exercise.  Recommendations/Changes made from today's visit: Suggested patient start taking Atorvastatin 10 mg tablet three days per week for LDL >70   Plan: Patient reports that at this time she doe not want to start any medication.    Subjective: [name] is an 56 y.o. year old female who is a primary patient of [name], [place].  The CCM team was consulted for assistance with disease management and care coordination needs.    Engaged with patient by telephone for follow up visit in response to provider referral for pharmacy case management and/or care coordination services.   Consent to Services:  The patient was given information about Chronic Care Management services, agreed to services, and gave verbal consent prior to initiation of services.  Please see initial visit note for detailed documentation.   Patient Care Team: [name], FNP as PCP - General (General Practice) Rex Kras Claudette Stapler, RN as Case Manager Mayford Knife, [facility] (Pharmacist)  Recent office visits: 09/11/2021 PCP Office visit   Recent consult visits: 07/24/2021 Neurology [facility] visits: None in previous 6 months   Objective:  Lab Results  Component Value Date   CREATININE 0.74 09/11/2021   BUN 10 09/11/2021   EGFR 95 09/11/2021   GFRNONAA 84 02/20/2020   GFRAA 97 02/20/2020   NA 142 09/11/2021   K 4.1 09/11/2021   CALCIUM 9.9 09/11/2021   CO2 26 09/11/2021   GLUCOSE 94 09/11/2021    Lab Results  Component Value Date/Time   HGBA1C 6.2 (H) 09/11/2021 03:27 PM   HGBA1C 11.8 (H) 05/01/2021 04:38 PM   MICROALBUR 10 09/11/2021 04:27 PM    Last diabetic Eye exam: No results found for: HMDIABEYEEXA  Last diabetic Foot exam: No results found for: HMDIABFOOTEX   Lab Results  Component Value Date   CHOL 175  09/11/2021   HDL 52 09/11/2021   LDLCALC 112 (H) 09/11/2021   TRIG 58 09/11/2021   CHOLHDL 3.4 09/11/2021    Hepatic Function Latest Ref Rng & Units 09/11/2021 05/01/2021 02/20/2020  Total Protein 6.0 - 8.5 g/dL 8.1 7.2 7.6  Albumin 3.8 - 4.9 g/dL 5.1(H) 4.4 4.3  AST 0 - 40 IU/L $Remov'17 13 17  'GcRWMV$ ALT 0 - 32 IU/L $Remov'21 27 23  'uIEJNJ$ Alk Phosphatase 44 - 121 IU/L 72 80 69  Total Bilirubin 0.0 - 1.2 mg/dL 0.5 0.3 0.3  Bilirubin, Direct 0.00 - 0.40 mg/dL - - -    Lab Results  Component Value Date/Time   TSH 4.120 08/02/2020 04:44 PM   TSH 4.53 03/25/2020 12:00 AM    CBC Latest Ref Rng & Units 09/11/2021 05/01/2021 08/02/2020  WBC 3.4 - 10.8 x10E3/uL 11.7(H) 8.0 9.7  Hemoglobin 11.1 - 15.9 g/dL 15.0 14.0 15.4  Hematocrit 34.0 - 46.6 % 44.9 41.5 45.8  Platelets 150 - 450 x10E3/uL 354 291 310    Lab Results  Component Value Date/Time   VD25OH 69.0 09/11/2021 03:27 PM   VD25OH 52.2 06/15/2019 04:31 PM    Clinical ASCVD: Yes  The 10-year ASCVD risk score (Arnett DK, et al., 2019) is: 7.5%   Values used to calculate the score:     Age: 60 years     Sex: Female     Is Non-Hispanic African American: Yes     Diabetic: Yes  Tobacco smoker: No     Systolic Blood Pressure: 741 mmHg     Is BP treated: No     HDL Cholesterol: 52 mg/dL     Total Cholesterol: 175 mg/dL    Depression screen [facility] 2/9 08/29/2021 08/29/2020 02/20/2020  Decreased Interest 0 0 0  Down, Depressed, Hopeless 0 0 0  PHQ - 2 Score 0 0 0     Social History   Tobacco Use  Smoking Status Never  Smokeless Tobacco Never   BP Readings from Last 3 Encounters:  09/11/21 130/82  05/01/21 132/80  09/09/20 140/73   Pulse Readings from Last 3 Encounters:  09/11/21 95  05/01/21 (!) 108  09/09/20 (!) 113   Wt Readings from Last 3 Encounters:  08/29/21 285 lb (129.3 kg)  09/18/20 290 lb (131.5 kg)  08/29/20 290 lb (131.5 kg)   BMI Readings from Last 3 Encounters:  08/29/21 44.64 kg/m  09/18/20 46.81 kg/m  08/29/20 45.42  kg/m    Assessment/Interventions: Review of patient past medical history, allergies, medications, health status, including review of consultants reports, laboratory and other test data, was performed as part of comprehensive evaluation and provision of chronic care management services.   SDOH:  (Social Determinants of Health) assessments and interventions performed: No  SDOH Screenings   Alcohol Screen: Not on file  Depression (PHQ2-9): Low Risk    PHQ-2 Score: 0  Financial Resource Strain: Low Risk    Difficulty of Paying Living Expenses: Not hard at all  Food Insecurity: No Food Insecurity   Worried About Charity fundraiser in the Last Year: Never true   Ran Out of Food in the Last Year: Never true  Housing: Low Risk    Last Housing Risk Score: 0  Physical Activity: Inactive   Days of Exercise per Week: 0 days   Minutes of Exercise per Session: 0 min  Social Connections: Not on file  Stress: No Stress Concern Present   Feeling of Stress : Not at all  Tobacco Use: Low Risk    Smoking Tobacco Use: Never   Smokeless Tobacco Use: Never   Passive Exposure: Not on file  Transportation Needs: No Transportation Needs   Lack of Transportation (Medical): No   Lack of Transportation (Non-Medical): No    CCM Care Plan  Allergies  Allergen Reactions   Food Other (See Comments)    WHAT FOOD CAUSES THIS REACTION? Eyes swelling and throat swelling   Oxycodone Hcl Hives   Soy Allergy     Other reaction(s): SWELLING    Medications Reviewed Today     Reviewed by Lynne Logan, RN (Registered Nurse) on 10/22/21 at 8  Med List Status: <None>   Medication Order Taking? Sig Documenting Provider Last Dose Status Informant  Blood Glucose Monitoring Suppl (FREESTYLE LITE) w/Device KIT [id number] No Inject 1 each into the skin 2 (two) times daily. dxE11.65 [name], NP Taking Active   Cholecalciferol (VITAMIN D3) 50 MCG (2000 UT) capsule [id number]  TAKE 1 CAPSULE BY MOUTH  EVERY DAY AS DIRECTED Sater, Nanine Means, MD  Active   [facility] B12 QUICK DISSOLVE 500 MCG LOZG [id number] No TAKE 2 TABS BY MOUTH DAILY Sater, Nanine Means, MD Taking Active   Dimethyl Fumarate 240 MG CPDR [id number] No Take 240 mg by mouth 2 (two) times daily. [provider] Taking Active            Med Note Ronnald Ramp, EMMA L   Tue Nov 06, 2020 11:42 AM)  CaseId:[id number];Status:Approved;Review Type:Prior Auth;Coverage Start Date:10/07/2020;Coverage End Date:11/06/2021;  glucose blood (FREESTYLE LITE) test strip [id number]  Use as instructed to check blood sugars 2 times per day dx:e11.65 [name], FNP  Active   ketoconazole (NIZORAL) 2 % cream [id number] No Apply 1 application topically daily.  Patient not taking: Reported on 08/29/2021   [name], NP Not Taking Active   metFORMIN (GLUCOPHAGE) 500 MG tablet [id number] No Take 1 tablet (500 mg total) by mouth 2 (two) times daily with a meal. [name], FNP Taking Expired 09/11/21 2359   Microlet Lancets MISC [id number] No Use as directed to check blood sugars 2 times per day dx: e11.65 [name], NP Taking Active   Probiotic Product (PROBIOTIC PO) [id number] No Take 1 Dose by mouth daily. [provider] Taking Active             Patient Active Problem List   Diagnosis Date Noted   Edema 08/29/2020   Lymphedema 08/02/2020   Stress 08/02/2020   Gait abnormality 08/02/2020   Acute urinary tract infection 10/28/2018   High risk medication use 10/21/2016   Urinary frequency 04/22/2016   Multiple sclerosis ([place]) 02/01/2015   Gait disturbance 02/01/2015   Left foot drop 02/01/2015   Other fatigue 02/01/2015   DS (disseminated sclerosis) ([place]) 01/06/2013   Avitaminosis D 01/06/2013    Immunization History  Administered Date(s) Administered   Influenza,inj,Quad PF,6+ Mos 06/21/2019, 09/11/2021   PFIZER Comirnaty(Gray Top)Covid-19 Tri-Sucrose Vaccine 03/25/2021   PFIZER(Purple Top)SARS-COV-2 Vaccination  02/02/2020, 03/13/2020, 09/25/2020    Conditions to be addressed/monitored:  Hypertension  Care Plan : [place]  Updates made by Mayford Knife, RPH since 10/30/2021 12:00 AM     Problem: HLD, DM II      Long-Range Goal: Disease Management   Recent Progress: On track  Note:   Current Barriers:  Unable to independently monitor therapeutic efficacy  Pharmacist Clinical Goal(s):  Patient will achieve adherence to monitoring guidelines and medication adherence to achieve therapeutic efficacy through collaboration with PharmD and provider.   Interventions: 1:1 collaboration with [name], FNP regarding development and update of comprehensive plan of care as evidenced by provider attestation and co-signature Inter-disciplinary care team collaboration (see longitudinal plan of care) Comprehensive medication review performed; medication list updated in electronic medical record  Diabetes (A1c goal <7%) -Controlled -Current medications: Metformin 500 mg tablet twice per day Appropriate, Effective, Safe, Accessible -Medications previously tried: None noted   -Current home glucose readings fasting glucose: 90-110-130  -Denies hypoglycemic/hyperglycemic symptoms -Congratulated Mrs.Bunda on her change in A1c -Current meal patterns: she has lowered the amount of carbohydrates, and she is not eating a lot of bread, using flat bread or the wrap bread. She has increased the amount of vegetables  drinks: she is increasing the amount of water she is drinking  -Current exercise: Exercising twice per week for an hour with the physical therapist -Educated on Prevention and management of hypoglycemic episodes; -Counseled to check feet daily and get yearly eye exams -Recommended to continue current medication Collaborated with PCP   Multiple Sclerosis (Goal: reduce flare ups) -Controlled -Current treatment  Dimethyl Fumarate 240 MG CPDR Appropriate, Effective, Safe,  Accessible -Recommended to continue current medication   Patient Goals/Self-Care Activities Patient will:  - take medications as prescribed as evidenced by patient report and record review  Follow Up Plan: The patient has been provided with contact information for the care management team and has been advised to call with any health related  questions or concerns.        Medication Assistance: None required.  Patient affirms current coverage meets needs.  Compliance/Adherence/Medication fill history: Care Gaps: PAP -Smear COVID-19 Vaccine  Star-Rating Drugs: Metformin 500 mg tablet  Patient's preferred pharmacy is:  [id number] [address], [place] [name] [place] [address] Phone: [phone] Fax: [phone]  [place], [address] [place] [address] Phone: [phone] Fax: [phone]  [facility] - [place], [place] - [address] [place] [address] Phone: [phone] Fax: [phone]  Uses pill box? Yes Pt endorses 80% compliance  We discussed: Benefits of medication synchronization, packaging and delivery as well as enhanced pharmacist oversight with [facility]. Patient decided to: Continue current medication management strategy  Care Plan and Follow Up Patient Decision:  Patient agrees to Care Plan and Follow-up.  Plan: The patient has been provided with contact information for the care management team and has been advised to call with any health related questions or concerns.   [name], CPP, PharmD Clinical Pharmacist Practitioner [facility] [phone]

## 2021-10-21 ENCOUNTER — Telehealth: Payer: Self-pay

## 2021-10-21 NOTE — Chronic Care Management (AMB) (Signed)
° ° °  Chronic Care Management Pharmacy Assistant   Name: MENUCHA DICESARE  MRN: [id number] DOB: 1965-10-14  Reason for Encounter: Medication Review/ Medication  Recent office visits:  None  Recent consult visits:  None  Hospital visits:  None in previous 6 months  Medications: Outpatient Encounter Medications as of 10/21/2021  Medication Sig Note   Blood Glucose Monitoring Suppl (FREESTYLE LITE) w/Device KIT Inject 1 each into the skin 2 (two) times daily. dxE11.65    Cholecalciferol (VITAMIN D3) 50 MCG (2000 UT) capsule TAKE 1 CAPSULE BY MOUTH EVERY DAY AS DIRECTED    [facility]S B12 QUICK DISSOLVE 500 MCG LOZG TAKE 2 TABS BY MOUTH DAILY    Dimethyl Fumarate 240 MG CPDR Take 240 mg by mouth 2 (two) times daily. 11/06/2020: CaseId:[id number];Status:Approved;Review Type:Prior Auth;Coverage Start Date:10/07/2020;Coverage End Date:11/06/2021;   glucose blood (FREESTYLE LITE) test strip Use as instructed to check blood sugars 2 times per day dx:e11.65    ketoconazole (NIZORAL) 2 % cream Apply 1 application topically daily. (Patient not taking: Reported on 08/29/2021)    metFORMIN (GLUCOPHAGE) 500 MG tablet Take 1 tablet (500 mg total) by mouth 2 (two) times daily with a meal.    Microlet Lancets MISC Use as directed to check blood sugars 2 times per day dx: e11.65    Probiotic Product (PROBIOTIC PO) Take 1 Dose by mouth daily.    No facility-administered encounter medications on file as of 10/21/2021.   Reviewed chart for medication changes ahead of medication coordination call.  No OVs, Consults, or hospital visits since last care coordination call/Pharmacist visit.  BP Readings from Last 3 Encounters:  09/11/21 130/82  05/01/21 132/80  09/09/20 140/73    Lab Results  Component Value Date   HGBA1C 6.2 (H) 09/11/2021     Patient obtains medications through Vials  30 Days   Last adherence delivery included:  Metformin 500 mg- One tablet twice daily with meals. Freestyle lite  strips  Patient declined (meds) last month: Metformin 500 mg- One tablet twice daily with meals. Patient stated she has a 30 day bottle on hand. Freestyle lite strips-  Patient states she has plenty of supply to last until next fill.  Patient is due for next adherence delivery on: 10-31-2021  Called patient and reviewed medications and coordinated delivery.  This delivery to include: Metformin 500 mg- One tablet twice daily with meals. Freestyle lite strips  No short/acute fill needed  Patient declined the following medications: Metformin- Patient states she is no longer taking medication.  Patient needs refills for: None  Confirmed delivery date of advised patient that pharmacy will contact them the morning of delivery.  Care Gaps: PAP smear overdue Covid booster overdue AWV 09-02-2022  Star Rating Drugs: Metformin 500 mg- Last filled   [place] Clinical Pharmacist Assistant [phone]

## 2021-10-22 ENCOUNTER — Ambulatory Visit: Payer: Self-pay

## 2021-10-22 ENCOUNTER — Telehealth: Payer: BC Managed Care – PPO

## 2021-10-22 DIAGNOSIS — E1165 Type 2 diabetes mellitus with hyperglycemia: Secondary | ICD-10-CM

## 2021-10-22 DIAGNOSIS — R609 Edema, unspecified: Secondary | ICD-10-CM

## 2021-10-22 DIAGNOSIS — E782 Mixed hyperlipidemia: Secondary | ICD-10-CM

## 2021-10-22 DIAGNOSIS — G35 Multiple sclerosis: Secondary | ICD-10-CM

## 2021-10-23 ENCOUNTER — Telehealth: Payer: Self-pay | Admitting: Neurology

## 2021-10-23 NOTE — Patient Instructions (Signed)
Visit Information  Thank you for taking time to visit with me today. Please don't hesitate to contact me if I can be of assistance to you before our next scheduled telephone appointment.  Following are the goals we discussed today:  (Copy and paste patient goals from clinical care plan here)  Our next appointment is by telephone on 01/02/22 at 11:15 AM  Please call the care guide team at [phone] if you need to cancel or reschedule your appointment.   If you are experiencing a Mental Health or [place] or need someone to talk to, please call 1-800-273-TALK (toll free, 24 hour hotline)   Patient verbalizes understanding of instructions and care plan provided today and agrees to view in [place]. Active MyChart status confirmed with patient.    Barb Merino, RN, BSN, CCM Care Management Coordinator [place] [facility]  Direct Phone: [phone]

## 2021-10-23 NOTE — Telephone Encounter (Signed)
PA completed on [facility] KEY: [id number] Will await response

## 2021-10-23 NOTE — Chronic Care Management (AMB) (Signed)
Chronic Care Management   CCM RN Visit Note  10/22/2021 Name: [name] MRN: [id number] DOB: July 19, 1966  Subjective: [name] is a 56 y.o. year old female who is a primary care patient of [name], [place]. The care management team was consulted for assistance with disease management and care coordination needs.    Engaged with patient by telephone for follow up visit in response to provider referral for case management and/or care coordination services.   Consent to Services:  The patient was given information about Chronic Care Management services, agreed to services, and gave verbal consent prior to initiation of services.  Please see initial visit note for detailed documentation.   Patient agreed to services and verbal consent obtained.   Assessment: Review of patient past medical history, allergies, medications, health status, including review of consultants reports, laboratory and other test data, was performed as part of comprehensive evaluation and provision of chronic care management services.   SDOH (Social Determinants of Health) assessments and interventions performed: Yes, no acute needs  CCM Care Plan  Allergies  Allergen Reactions   Food Other (See Comments)    WHAT FOOD CAUSES THIS REACTION? Eyes swelling and throat swelling   Oxycodone Hcl Hives   Soy Allergy     Other reaction(s): SWELLING    Outpatient Encounter Medications as of 10/22/2021  Medication Sig Note   Blood Glucose Monitoring Suppl (FREESTYLE LITE) w/Device KIT Inject 1 each into the skin 2 (two) times daily. dxE11.65    Cholecalciferol (VITAMIN D3) 50 MCG (2000 UT) capsule TAKE 1 CAPSULE BY MOUTH EVERY DAY AS DIRECTED    [employer] B12 QUICK DISSOLVE 500 MCG LOZG TAKE 2 TABS BY MOUTH DAILY    Dimethyl Fumarate 240 MG CPDR Take 240 mg by mouth 2 (two) times daily. 11/06/2020: CaseId:[id number];Status:Approved;Review Type:Prior Auth;Coverage Start Date:10/07/2020;Coverage End Date:11/06/2021;    glucose blood (FREESTYLE LITE) test strip Use as instructed to check blood sugars 2 times per day dx:e11.65    ketoconazole (NIZORAL) 2 % cream Apply 1 application topically daily. (Patient not taking: Reported on 08/29/2021)    metFORMIN (GLUCOPHAGE) 500 MG tablet Take 1 tablet (500 mg total) by mouth 2 (two) times daily with a meal.    Microlet Lancets MISC Use as directed to check blood sugars 2 times per day dx: e11.65    Probiotic Product (PROBIOTIC PO) Take 1 Dose by mouth daily.    No facility-administered encounter medications on file as of 10/22/2021.    Patient Active Problem List   Diagnosis Date Noted   Edema 08/29/2020   Lymphedema 08/02/2020   Stress 08/02/2020   Gait abnormality 08/02/2020   Acute urinary tract infection 10/28/2018   High risk medication use 10/21/2016   Urinary frequency 04/22/2016   Multiple sclerosis ([place]) 02/01/2015   Gait disturbance 02/01/2015   Left foot drop 02/01/2015   Other fatigue 02/01/2015   DS (disseminated sclerosis) ([place]) 01/06/2013   Avitaminosis D 01/06/2013    Conditions to be addressed/monitored: Diabetes Mellitus due to underlying condition with type 1 chronic kidney disease; Multiple Sclerosis, Mixed Hyperlipidemia, Edema unspecified    Care Plan : RN Care Manager Plan of Care  Updates made by Lynne Logan, RN since 10/22/2021 12:00 AM     Problem: No plan established for management of chronic disease states (Diabetes Mellitus due to underlying condition with type 1 chronic kidney disease; Multiple Sclerosis, Mixed Hyperlipidemia, Edema unspecified)   Priority: High     Long-Range Goal: Development of  plan of care for chronic disease management for Diabetes Mellitus due to underlying condition with type 1 chronic kidney disease; Multiple Sclerosis, Mixed Hyperlipidemia, Edema unspecified   Start Date: 07/16/2021  Expected End Date: 07/16/2022  Recent Progress: On track  Priority: High  Note:   Current Barriers:  Knowledge  Deficits related to plan of care for management of Diabetes Mellitus due to underlying condition with type 1 chronic kidney disease; Multiple Sclerosis, Mixed Hyperlipidemia, Edema unspecified   Chronic Disease Management support and education needs related to Diabetes Mellitus due to underlying condition with type 1 chronic kidney disease; Multiple Sclerosis, Mixed Hyperlipidemia, Edema unspecified    RNCM Clinical Goal(s):  Patient will verbalize basic understanding of  Diabetes Mellitus due to underlying condition with type 1 chronic kidney disease; Multiple Sclerosis, Mixed Hyperlipidemia, Edema unspecified   disease process and self health management plan   take all medications exactly as prescribed and will call provider for medication related questions demonstrate Improved health management independence   continue to work with RN Care Manager to address care management and care coordination needs related to  Diabetes Mellitus due to underlying condition with type 1 chronic kidney disease; Multiple Sclerosis, Mixed Hyperlipidemia, Edema unspecified   will demonstrate ongoing self health care management ability    through collaboration with RN Care manager, provider, and care team.   Interventions: 1:1 collaboration with primary care provider regarding development and update of comprehensive plan of care as evidenced by provider attestation and co-signature Inter-disciplinary care team collaboration (see longitudinal plan of care) Evaluation of current treatment plan related to  self management and patient's adherence to plan as established by provider  Edema to Lower Extremities: Status: (Condition stable.  Not addressed this visit.) Long Term Goal Evaluation of current treatment plan related to  Edema to lower extremities , self-management and patient's adherence to plan as established by provider. Discussed plans with patient for ongoing care management follow up and provided patient with  direct contact information for care management team Provided education to patient re: the importance of adherence to low Sodium diet; increasing water intake; Discussed plans with patient for ongoing care management follow up and provided patient with direct contact information for care management team; Educated patient providing rationale, the importance to continue to wear compression socks to help reduce her edema and reduce the risk for impaired skin integrity and or DVT  Diabetes Interventions:  (Status:  Goal on track:  Yes.) Long Term Goal Assessed patient's understanding of A1c goal: <6.5% Re-educated patient about basic DM disease process Review of patient status, including review of consultants reports, relevant laboratory and other test results, and medications completed Reviewed medications with patient and discussed importance of medication adherence, determined patient has opted to stop taking Metformin, she will manage her DM with diet and exercise  Counseled on importance of regular laboratory monitoring as prescribed Advised patient, providing education and rationale, to continue checking her cbg's daily before meals and at bedtime and record, calling PCP for findings outside established parameters Educated patient on dietary and exercise recommendations; daily glycemic control FBS 80-130, <180 after meals;15'15' rule Assessed social determinant of health barriers Discussed plans with patient for ongoing care management follow up and provided patient with direct contact information for care management team Lab Results  Component Value Date   HGBA1C 6.2 (H) 09/11/2021    Relapsing Remitting Multiple Sclerosis Interventions: Status: (Goal on track:  Yes) Long Term Goal Evaluation of current treatment plan related to  Multiple Sclerosis , self-management and patient's adherence to plan as established by provider. Review of patient status, including review of consultant's reports,  relevant laboratory and other test results, and medications completed. Reviewed medications with patient and discussed importance of medication adherence Determined patient continues to work with in home PT twice weekly, she is finding this to be effective Educated patient on the importance to follow her HEP as directed, educated on recommendations for 150 minutes as tolerated Technical sales engineer related to How to Perform Chair Exercises Discussed plans with patient for ongoing care management follow up and provided patient with direct contact information for care management team  Patient Goals/Self-Care Activities: Take all medications as prescribed Attend all scheduled provider appointments Call pharmacy for medication refills 3-7 days in advance of running out of medications Call provider office for new concerns or questions  drink 6 to 8 glasses of water each day fill half of plate with vegetables manage portion size  Follow Up Plan:  Telephone follow up appointment with care management team member scheduled for:  01/02/22     Plan:Telephone follow up appointment with care management team member scheduled for:  01/02/22  Barb Merino, RN, BSN, CCM Care Management Coordinator [facility]  Direct Phone: [phone]

## 2021-10-30 NOTE — Patient Instructions (Addendum)
Visit Information It was great speaking with you today!  Please let me know if you have any questions about our visit.   Goals Addressed             This Visit's Progress    Manage My Medicine       Timeframe:  Long-Range Goal Priority:  High Start Date:                             Expected End Date:                       Follow Up Date 04/17/2022   In Progress:   - call for medicine refill 2 or 3 days before it runs out - call if I am sick and can't take my medicine - keep a list of all the medicines I take; vitamins and herbals too - use a pillbox to sort medicine - use an alarm clock or phone to remind me to take my medicine    Why is this important?   These steps will help you keep on track with your medicines.   Notes:  Please contact us if you have any questions about your medications         Patient Care Plan: CCM Pharmacy Care Plan     Problem Identified: HLD, DM II      Long-Range Goal: Disease Management   Recent Progress: On track  Note:   Current Barriers:  Unable to independently monitor therapeutic efficacy  Pharmacist Clinical Goal(s):  Patient will achieve adherence to monitoring guidelines and medication adherence to achieve therapeutic efficacy through collaboration with PharmD and provider.   Interventions: 1:1 collaboration with [name], FNP regarding development and update of comprehensive plan of care as evidenced by provider attestation and co-signature Inter-disciplinary care team collaboration (see longitudinal plan of care) Comprehensive medication review performed; medication list updated in electronic medical record  Diabetes (A1c goal <7%) -Controlled -Current medications: Metformin 500 mg tablet twice per day Appropriate, Effective, Safe, Accessible -Medications previously tried: None noted   -Current home glucose readings fasting glucose: 90-110-130  -Denies hypoglycemic/hyperglycemic symptoms -Congratulated  [name] on her change in A1c -Current meal patterns: she has lowered the amount of carbohydrates, and she is not eating a lot of bread, using flat bread or the wrap bread. She has increased the amount of vegetables  drinks: she is increasing the amount of water she is drinking  -Current exercise: Exercising twice per week for an hour with the physical therapist -Educated on Prevention and management of hypoglycemic episodes; -Counseled to check feet daily and get yearly eye exams -Recommended to continue current medication Collaborated with PCP   Multiple Sclerosis (Goal: reduce flare ups) -Controlled -Current treatment  Dimethyl Fumarate 240 MG CPDR Appropriate, Effective, Safe, Accessible -Recommended to continue current medication   Patient Goals/Self-Care Activities Patient will:  - take medications as prescribed as evidenced by patient report and record review  Follow Up Plan: The patient has been provided with contact information for the care management team and has been advised to call with any health related questions or concerns.        Patient agreed to services and verbal consent obtained.   The patient verbalized understanding of instructions, educational materials, and care plan provided today and agreed to receive a mailed copy of patient instructions, educational materials, and care plan.   [name], PharmD Clinical Pharmacist  [place] [phone]

## 2021-10-31 ENCOUNTER — Other Ambulatory Visit: Payer: Self-pay | Admitting: Neurology

## 2021-10-31 MED ORDER — DIMETHYL FUMARATE 240 MG PO CPDR: 240.0000 mg | DELAYED_RELEASE_CAPSULE | Freq: Two times a day (BID) | ORAL | 5 refills | Status: DC

## 2021-11-04 NOTE — Telephone Encounter (Signed)
IBBCWU:[id number];WTUUEK:[id number];Review Type:Prior Auth;Coverage Start Date:09/23/2021;Coverage End Date:11/01/2022;

## 2021-11-19 ENCOUNTER — Other Ambulatory Visit: Payer: Self-pay

## 2021-11-19 ENCOUNTER — Ambulatory Visit (INDEPENDENT_AMBULATORY_CARE_PROVIDER_SITE_OTHER): Payer: BC Managed Care – PPO | Admitting: Nurse Practitioner

## 2021-11-19 ENCOUNTER — Encounter: Payer: Self-pay | Admitting: Nurse Practitioner

## 2021-11-19 VITALS — BP 124/76 | HR 95 | Temp 98.3°F | Ht 67.0 in | Wt 267.0 lb

## 2021-11-19 DIAGNOSIS — Z79899 Other long term (current) drug therapy: Secondary | ICD-10-CM

## 2021-11-19 DIAGNOSIS — E782 Mixed hyperlipidemia: Secondary | ICD-10-CM

## 2021-11-19 DIAGNOSIS — E1165 Type 2 diabetes mellitus with hyperglycemia: Secondary | ICD-10-CM

## 2021-11-19 DIAGNOSIS — R600 Localized edema: Secondary | ICD-10-CM

## 2021-11-19 DIAGNOSIS — G35 Multiple sclerosis: Secondary | ICD-10-CM | POA: Diagnosis not present

## 2021-11-19 LAB — CBC
Hematocrit: 43.1 % (ref 34.0–46.6)
Hemoglobin: 14.8 g/dL (ref 11.1–15.9)
MCH: 30 pg (ref 26.6–33.0)
MCHC: 34.3 g/dL (ref 31.5–35.7)
MCV: 87 fL (ref 79–97)
Platelets: 236 10*3/uL (ref 150–450)
RBC: 4.94 x10E6/uL (ref 3.77–5.28)
RDW: 13.3 % (ref 11.7–15.4)
WBC: 6.7 10*3/uL (ref 3.4–10.8)

## 2021-11-19 LAB — BMP8+EGFR
BUN/Creatinine Ratio: 11 (ref 9–23)
BUN: 9 mg/dL (ref 6–24)
CO2: 20 mmol/L (ref 20–29)
Calcium: 9.3 mg/dL (ref 8.7–10.2)
Chloride: 103 mmol/L (ref 96–106)
Creatinine, Ser: 0.81 mg/dL (ref 0.57–1.00)
Glucose: 118 mg/dL — ABNORMAL HIGH (ref 70–99)
Potassium: 3.8 mmol/L (ref 3.5–5.2)
Sodium: 139 mmol/L (ref 134–144)
eGFR: 86 mL/min/{1.73_m2} (ref >59)

## 2021-11-19 LAB — HEMOGLOBIN A1C
Est. average glucose Bld gHb Est-mCnc: 143 mg/dL
Hgb A1c MFr Bld: 6.6 % — ABNORMAL HIGH (ref 4.8–5.6)

## 2021-11-19 MED ORDER — FUROSEMIDE 20 MG PO TABS: ORAL_TABLET | ORAL | 11 refills | Status: AC

## 2021-11-19 MED ORDER — CHLORTHALIDONE 15 MG PO TABS: ORAL_TABLET | ORAL | 1 refills | Status: DC

## 2021-11-19 NOTE — Progress Notes (Signed)
I,Tianna Badgett,acting as a Neurosurgeon for [employer], FNP.,have documented all relevant documentation on the behalf of Arnette Felts, FNP,as directed by  Arnette Felts, FNP while in the presence of Arnette Felts, FNP.  This visit occurred during the SARS-CoV-2 public health emergency.  Safety protocols were in place, including screening questions prior to the visit, additional usage of staff PPE, and extensive cleaning of exam room while observing appropriate contact time as indicated for disinfecting solutions.  Subjective:     Patient ID: [name] , female    DOB: 06-07-1966 , 56 y.o.   MRN: [id number]   Chief Complaint  Patient presents with   Diabetes   Hyperlipidemia    HPI  Patient presents for HTN and DM follow up. She was on metformin and just stopped taking on her own due to concerns about her muscles detiorating after 6 months. She reports she is watching what she eats. States she wants to try to improve her diabetes on her own.   She was changed from Tecfidera to the generic dimethyl fumarate  She is taking sea moss that is helping for her back pain and the ability to walk further than usual.  She continues to go to [facility] next appt in this summer  Diabetes She presents for her follow-up diabetic visit. She has type 2 diabetes mellitus. There are no hypoglycemic associated symptoms. There are no diabetic associated symptoms. There are no hypoglycemic complications. There are no diabetic complications. Risk factors for coronary artery disease include sedentary lifestyle and obesity. When asked about current treatments, none (patient not taking metformin) were reported.    Past Medical History:  Diagnosis Date   Movement disorder    Multiple sclerosis (HCC)      Family History  Problem Relation Age of Onset   Hypertension Mother    Hypertension Father    Diabetes Father      Current Outpatient Medications:    Blood Glucose Monitoring Suppl  (FREESTYLE LITE) w/Device KIT, Inject 1 each into the skin 2 (two) times daily. dxE11.65, Disp: 1 kit, Rfl: 1   Cholecalciferol (VITAMIN D3) 50 MCG (2000 UT) capsule, TAKE 1 CAPSULE BY MOUTH EVERY DAY AS DIRECTED, Disp: 90 capsule, Rfl: 0   CVS B12 QUICK DISSOLVE 500 MCG LOZG, TAKE 2 TABS BY MOUTH DAILY, Disp: 180 lozenge, Rfl: 1   Dimethyl Fumarate 240 MG CPDR, Take 1 capsule (240 mg total) by mouth 2 (two) times daily., Disp: 60 capsule, Rfl: 5   furosemide (LASIX) 20 MG tablet, Take 1 tab by mouth daily as needed for leg edema, Disp: 30 tablet, Rfl: 11   glucose blood (FREESTYLE LITE) test strip, Use as instructed to check blood sugars 2 times per day dx:e11.65, Disp: 100 each, Rfl: 5   Microlet Lancets MISC, Use as directed to check blood sugars 2 times per day dx: e11.65, Disp: 100 each, Rfl: 11   OVER THE COUNTER MEDICATION, Sea Moss [employer] food 1 - 2 capsules daily, Disp: , Rfl:    Probiotic Product (PROBIOTIC PO), Take 1 Dose by mouth daily., Disp: , Rfl:    ketoconazole (NIZORAL) 2 % cream, Apply 1 application topically daily. (Patient not taking: Reported on 08/29/2021), Disp: 15 g, Rfl: 1   metFORMIN (GLUCOPHAGE) 500 MG tablet, Take 1 tablet (500 mg total) by mouth 2 (two) times daily with a meal., Disp: 180 tablet, Rfl: 1   Allergies  Allergen Reactions   Food Other (See Comments)  WHAT FOOD CAUSES THIS REACTION? Eyes swelling and throat swelling   Oxycodone Hcl Hives   Soy Allergy     Other reaction(s): SWELLING     Review of Systems  Constitutional: Negative.   Respiratory: Negative.    Cardiovascular: Negative.   Gastrointestinal: Negative.   Neurological: Negative.     Today's Vitals   11/19/21 1048  BP: 124/76  Pulse: 95  Temp: 98.3 F (36.8 C)  Weight: 267 lb (121.1 kg)  Height: 5\' 7"  (1.702 m)   Body mass index is 41.82 kg/m.  Wt Readings from Last 3 Encounters:  11/19/21 267 lb (121.1 kg)  08/29/21 285 lb (129.3 kg)  09/18/20 290 lb (131.5 kg)     BP Readings from Last 3 Encounters:  11/19/21 124/76  09/11/21 130/82  05/01/21 132/80    Objective:  Physical Exam Vitals reviewed.  Constitutional:      General: She is not in acute distress.    Appearance: Normal appearance.  Cardiovascular:     Rate and Rhythm: Normal rate and regular rhythm.     Pulses: Normal pulses.     Heart sounds: Normal heart sounds. No murmur heard. Pulmonary:     Effort: Pulmonary effort is normal. No respiratory distress.     Breath sounds: Normal breath sounds. No wheezing.  Skin:    Capillary Refill: Capillary refill takes less than 2 seconds.  Neurological:     General: No focal deficit present.     Mental Status: She is alert and oriented to person, place, and time.     Cranial Nerves: No cranial nerve deficit.     Motor: No weakness.  Psychiatric:        Mood and Affect: Mood normal.        Behavior: Behavior normal.        Thought Content: Thought content normal.        Judgment: Judgment normal.        Assessment And Plan:     1. Type 2 diabetes mellitus with hyperglycemia, without long-term current use of insulin (HCC) Comments: She continues to not want to take any medications for diabetes, discussed the risk for microvascular damage. Discussed side effects of metformin  - BMP8+EGFR - Hemoglobin A1c  2. Mixed hyperlipidemia Comments: LDL was elevated at last visit, continue to follow low fat diet. - Lipid panel  3. Multiple sclerosis (HCC) Comments: Continue follow up with Neurology (Dr. Epimenio Foot). Her next visit is in August   4. Leg edema Comments: Continues to have swelling to her lower extremities 1+, advised to wear support socks, keep legs elevated. Will treat with lasix as needed - furosemide (LASIX) 20 MG tablet; Take 1 tab by mouth daily as needed for leg edema  Dispense: 30 tablet; Refill: 11  5. Other long term (current) drug therapy - CBC no Diff     Patient was given opportunity to ask questions. Patient  verbalized understanding of the plan and was able to repeat key elements of the plan. All questions were answered to their satisfaction.  Arnette Felts, FNP   I, Arnette Felts, FNP, have reviewed all documentation for this visit. The documentation on 11/19/21 for the exam, diagnosis, procedures, and orders are all accurate and complete.   IF YOU HAVE BEEN REFERRED TO A SPECIALIST, IT MAY TAKE 1-2 WEEKS TO SCHEDULE/PROCESS THE REFERRAL. IF YOU HAVE NOT HEARD FROM US/SPECIALIST IN TWO WEEKS, PLEASE GIVE [place] A CALL AT [phone] X [phone].   THE PATIENT IS  ENCOURAGED TO PRACTICE SOCIAL DISTANCING DUE TO THE COVID-19 PANDEMIC.

## 2021-11-19 NOTE — Patient Instructions (Signed)
Mediterranean Diet °A Mediterranean diet refers to food and lifestyle choices that are based on the traditions of countries located on the [place]. It focuses on eating more fruits, vegetables, whole grains, beans, nuts, seeds, and heart-healthy fats, and eating less dairy, meat, eggs, and processed foods with added sugar, salt, and fat. This way of eating has been shown to help prevent certain conditions and improve outcomes for people who have chronic diseases, like kidney disease and heart disease. °What are tips for following this plan? °Reading food labels °Check the serving size of packaged foods. For foods such as rice and pasta, the serving size refers to the amount of cooked product, not dry. °Check the total fat in packaged foods. Avoid foods that have saturated fat or trans fats. °Check the ingredient list for added sugars, such as corn syrup. °Shopping ° °Buy a variety of foods that offer a balanced diet, including: °Fresh fruits and vegetables (produce). °Grains, beans, nuts, and seeds. Some of these may be available in unpackaged forms or large amounts (in bulk). °Fresh seafood. °Poultry and eggs. °Low-fat dairy products. °Buy whole ingredients instead of prepackaged foods. °Buy fresh fruits and vegetables in-season from local farmers markets. °Buy plain frozen fruits and vegetables. °If you do not have access to quality fresh seafood, buy precooked frozen shrimp or canned fish, such as tuna, salmon, or sardines. °Stock your pantry so you always have certain foods on hand, such as olive oil, canned tuna, canned tomatoes, rice, pasta, and beans. °Cooking °Cook foods with extra-virgin olive oil instead of using butter or other vegetable oils. °Have meat as a side dish, and have vegetables or grains as your main dish. This means having meat in small portions or adding small amounts of meat to foods like pasta or stew. °Use beans or vegetables instead of meat in common dishes like chili or  lasagna. °Experiment with different cooking methods. Try roasting, broiling, steaming, and sautéing vegetables. °Add frozen vegetables to soups, stews, pasta, or rice. °Add nuts or seeds for added healthy fats and plant protein at each meal. You can add these to yogurt, salads, or vegetable dishes. °Marinate fish or vegetables using olive oil, lemon juice, garlic, and fresh herbs. °Meal planning °Plan to eat one vegetarian meal one day each week. Try to work up to two vegetarian meals, if possible. °Eat seafood two or more times a week. °Have healthy snacks readily available, such as: °Vegetable sticks with hummus. °Greek yogurt. °Fruit and nut trail mix. °Eat balanced meals throughout the week. This includes: °Fruit: 2-3 servings a day. °Vegetables: 4-5 servings a day. °Low-fat dairy: 2 servings a day. °Fish, poultry, or lean meat: 1 serving a day. °Beans and legumes: 2 or more servings a week. °Nuts and seeds: 1-2 servings a day. °Whole grains: 6-8 servings a day. °Extra-virgin olive oil: 3-4 servings a day. °Limit red meat and sweets to only a few servings a month. °Lifestyle ° °Cook and eat meals together with your family, when possible. °Drink enough fluid to keep your urine pale yellow. °Be physically active every day. This includes: °Aerobic exercise like running or swimming. °Leisure activities like gardening, walking, or housework. °Get 7-8 hours of sleep each night. °If recommended by your health care provider, drink red wine in moderation. This means 1 glass a day for nonpregnant women and 2 glasses a day for men. A glass of wine equals 5 oz (150 mL). °What foods should I eat? °Fruits °Apples. Apricots. Avocado. Berries. Bananas. Cherries. Dates.   Figs. Grapes. Lemons. Melon. Oranges. Peaches. Plums. Pomegranate. °Vegetables °Artichokes. Beets. Broccoli. Cabbage. Carrots. Eggplant. Green beans. Chard. Kale. Spinach. Onions. Leeks. Peas. Squash. Tomatoes. Peppers. Radishes. °Grains °Whole-grain pasta. Brown  rice. Bulgur wheat. Polenta. Couscous. Whole-wheat bread. Oatmeal. Quinoa. °Meats and other proteins °Beans. Almonds. Sunflower seeds. Pine nuts. Peanuts. Cod. Salmon. Scallops. Shrimp. Tuna. Tilapia. Clams. Oysters. Eggs. Poultry without skin. °Dairy °Low-fat milk. Cheese. Greek yogurt. °Fats and oils °Extra-virgin olive oil. Avocado oil. Grapeseed oil. °Beverages °Water. Red wine. Herbal tea. °Sweets and desserts °Greek yogurt with honey. Baked apples. Poached pears. Trail mix. °Seasonings and condiments °Basil. Cilantro. Coriander. Cumin. Mint. Parsley. Sage. Rosemary. Tarragon. Garlic. Oregano. Thyme. Pepper. Balsamic vinegar. Tahini. Hummus. Tomato sauce. Olives. Mushrooms. °The items listed above may not be a complete list of foods and beverages you can eat. Contact a dietitian for more information. °What foods should I limit? °This is a list of foods that should be eaten rarely or only on special occasions. °Fruits °Fruit canned in syrup. °Vegetables °Deep-fried potatoes (french fries). °Grains °Prepackaged pasta or rice dishes. Prepackaged cereal with added sugar. Prepackaged snacks with added sugar. °Meats and other proteins °Beef. Pork. Lamb. Poultry with skin. Hot dogs. Bacon. °Dairy °Ice cream. Sour cream. Whole milk. °Fats and oils °Butter. Canola oil. Vegetable oil. Beef fat (tallow). Lard. °Beverages °Juice. Sugar-sweetened soft drinks. Beer. Liquor and spirits. °Sweets and desserts °Cookies. Cakes. Pies. Candy. °Seasonings and condiments °Mayonnaise. Pre-made sauces and marinades. °The items listed above may not be a complete list of foods and beverages you should limit. Contact a dietitian for more information. °Summary °The Mediterranean diet includes both food and lifestyle choices. °Eat a variety of fresh fruits and vegetables, beans, nuts, seeds, and whole grains. °Limit the amount of red meat and sweets that you eat. °If recommended by your health care provider, drink red wine in moderation.  This means 1 glass a day for nonpregnant women and 2 glasses a day for men. A glass of wine equals 5 oz (150 mL). °This information is not intended to replace advice given to you by your health care provider. Make sure you discuss any questions you have with your health care provider. °Document Revised: 10/07/2019 Document Reviewed: 08/04/2019 °Elsevier Patient Education © 2022 [employer] ° °

## 2021-11-20 LAB — LIPID PANEL
Chol/HDL Ratio: 3.2 ratio (ref 0.0–4.4)
Cholesterol, Total: 165 mg/dL (ref 100–199)
HDL: 51 mg/dL (ref >39)
LDL Chol Calc (NIH): 104 mg/dL — ABNORMAL HIGH (ref 0–99)
Triglycerides: 47 mg/dL (ref 0–149)
VLDL Cholesterol Cal: 10 mg/dL (ref 5–40)

## 2021-11-21 ENCOUNTER — Other Ambulatory Visit: Payer: Self-pay | Admitting: Nurse Practitioner

## 2021-11-21 MED ORDER — METFORMIN HCL 500 MG PO TABS: 500.0000 mg | ORAL_TABLET | Freq: Two times a day (BID) | ORAL | 1 refills | Status: DC

## 2021-11-28 ENCOUNTER — Telehealth: Payer: Self-pay | Admitting: Neurology

## 2021-11-28 NOTE — Telephone Encounter (Signed)
Pt is asking for a letter from Dr Felecia Shelling to be excused from [employer] Duty ?

## 2021-11-28 NOTE — Telephone Encounter (Signed)
Dr Felecia Shelling will be ok with providing the pt a letter to assist with [employer] Duty. Due to after hours this will have to wait until next week. Once letter is completed we will let the patient know.  ? ?** If pt calls back before letter completed, please advise it will be completed. ?

## 2021-12-02 ENCOUNTER — Encounter: Payer: Self-pay | Admitting: Neurology

## 2021-12-02 NOTE — Telephone Encounter (Signed)
Called the patient back to advise the letter was completed. Pt asked that it is mailed to her. Confirmed address on file is correct. Pt verbalized understanding. ?Pt had no questions at this time but was encouraged to call back if questions arise. ? ?

## 2021-12-05 ENCOUNTER — Telehealth: Payer: Self-pay

## 2021-12-05 NOTE — Progress Notes (Signed)
? ? ?  Chronic Care Management ?Pharmacy Assistant  ? ?Name: [name]  MRN: [id number] DOB: August 18, 1966 ? ?Reason for Encounter: Medication Review/ Medication Coordination ? ?Recent office visits:  ?11-19-2021 [name], [place]. Glucose= 118. A1C= 6.6. LDL= 104. STOP metformin patient preference. START Lasix 20 mg as needed for edema. ? ?10-22-2021 Little, Claudette Stapler, RN (CCM) ? ?Recent consult visits:  ?None ? ?Hospital visits:  ?None in previous 6 months ? ?Medications: ?Outpatient Encounter Medications as of 12/05/2021  ?Medication Sig  ? Blood Glucose Monitoring Suppl (FREESTYLE LITE) w/Device KIT Inject 1 each into the skin 2 (two) times daily. dxE11.65  ? Cholecalciferol (VITAMIN D3) 50 MCG (2000 UT) capsule TAKE 1 CAPSULE BY MOUTH EVERY DAY AS DIRECTED  ? [facility] B12 QUICK DISSOLVE 500 MCG LOZG TAKE 2 TABS BY MOUTH DAILY  ? Dimethyl Fumarate 240 MG CPDR Take 1 capsule (240 mg total) by mouth 2 (two) times daily.  ? furosemide (LASIX) 20 MG tablet Take 1 tab by mouth daily as needed for leg edema  ? glucose blood (FREESTYLE LITE) test strip Use as instructed to check blood sugars 2 times per day dx:e11.65  ? ketoconazole (NIZORAL) 2 % cream Apply 1 application topically daily. (Patient not taking: Reported on 08/29/2021)  ? metFORMIN (GLUCOPHAGE) 500 MG tablet Take 1 tablet (500 mg total) by mouth 2 (two) times daily with a meal.  ? Microlet Lancets MISC Use as directed to check blood sugars 2 times per day dx: e11.65  ? OVER THE COUNTER MEDICATION [place] food ?1 - 2 capsules daily  ? Probiotic Product (PROBIOTIC PO) Take 1 Dose by mouth daily.  ? ?No facility-administered encounter medications on file as of 12/05/2021.  ?Reviewed chart for medication changes ahead of medication coordination call. ? ?No Consults, or hospital visits since last care coordination call/Pharmacist visit. ? ?BP Readings from Last 3 Encounters:  ?11/19/21 124/76  ?09/11/21 130/82  ?05/01/21 132/80  ?  ?Lab Results   ?Component Value Date  ? HGBA1C 6.6 (H) 11/19/2021  ?  ? ?Patient obtains medications through Vials  30 Days  ? ?Last adherence delivery included:  ?Freestyle lite strips ? ?Patient declined (meds) last month: ?Metformin- Patient states she is no longer taking medication. ? ?Patient is due for next adherence delivery on: 12-17-2021 ? ?Called patient and reviewed medications and coordinated delivery. ? ?This delivery to include: ?Lasix 20 mg daily as needed ?Embrace talk test strips ? ?No acute/ short fill needed ? ?Patient declined the following medications: ?Metformin- Discontinued ? ?Patient needs refills for: ?None ? ?Confirmed delivery date of 12-17-2021 advised patient that pharmacy will contact them the morning of delivery. ? ?Care Gaps: ?PAP smear overdue ?Covid booster overdue ?AWV 09-02-2022 ? ?Star Rating Drugs: ?None ? ?Malecca Hicks CMA ?Clinical Pharmacist Assistant ?[phone] ? ?

## 2021-12-09 ENCOUNTER — Encounter: Payer: Self-pay | Admitting: Nurse Practitioner

## 2021-12-09 DIAGNOSIS — Z5320 Procedure and treatment not carried out because of patient's decision for unspecified reasons: Secondary | ICD-10-CM | POA: Insufficient documentation

## 2021-12-19 ENCOUNTER — Telehealth: Payer: Self-pay

## 2021-12-19 NOTE — Chronic Care Management (AMB) (Addendum)
? ? ?Chronic Care Management ?Pharmacy Assistant  ? ?Name: [name]  MRN: [id number] DOB: [birth year] ? ?Reason for Encounter: Disease State/ Diabetes ?  ?Recent office visits:  ?None ? ?Recent consult visits:  ?None ? ?Hospital visits:  ?None in previous 6 months ? ?Medications: ?Outpatient Encounter Medications as of 12/19/2021  ?Medication Sig  ? Blood Glucose Monitoring Suppl (FREESTYLE LITE) w/Device KIT Inject 1 each into the skin 2 (two) times daily. dxE11.65  ? Cholecalciferol (VITAMIN D3) 50 MCG (2000 UT) capsule TAKE 1 CAPSULE BY MOUTH EVERY DAY AS DIRECTED  ? [facility] B12 QUICK DISSOLVE 500 MCG LOZG TAKE 2 TABS BY MOUTH DAILY  ? Dimethyl Fumarate 240 MG CPDR Take 1 capsule (240 mg total) by mouth 2 (two) times daily.  ? furosemide (LASIX) 20 MG tablet Take 1 tab by mouth daily as needed for leg edema  ? glucose blood (FREESTYLE LITE) test strip Use as instructed to check blood sugars 2 times per day dx:e11.65  ? ketoconazole (NIZORAL) 2 % cream Apply 1 application topically daily. (Patient not taking: Reported on 08/29/2021)  ? metFORMIN (GLUCOPHAGE) 500 MG tablet Take 1 tablet (500 mg total) by mouth 2 (two) times daily with a meal.  ? Microlet Lancets MISC Use as directed to check blood sugars 2 times per day dx: e11.65  ? OVER THE COUNTER MEDICATION [place] food ?1 - 2 capsules daily  ? Probiotic Product (PROBIOTIC PO) Take 1 Dose by mouth daily.  ? ?No facility-administered encounter medications on file as of 12/19/2021.  ?Recent Relevant Labs: ?Lab Results  ?Component Value Date/Time  ? HGBA1C 6.6 (H) 11/19/2021 11:18 AM  ? HGBA1C 6.2 (H) 09/11/2021 03:27 PM  ? MICROALBUR 10 09/11/2021 04:27 PM  ?  ?Kidney Function ?Lab Results  ?Component Value Date/Time  ? CREATININE 0.81 11/19/2021 11:18 AM  ? CREATININE 0.74 09/11/2021 03:27 PM  ? GFRNONAA 84 02/20/2020 04:05 PM  ? GFRAA 97 02/20/2020 04:05 PM  ? ? ?Current antihyperglycemic regimen:  ?None ? ?What recent interventions/DTPs have been  made to improve glycemic control:  ?-Educated on Prevention and management of hypoglycemic episodes; ?-Counseled to check feet daily and get yearly eye exams ?-Recommended to continue current medication ? ?Have there been any recent hospitalizations or ED visits since last visit with CPP? No ? ?Patient denies hypoglycemic symptoms ? ?Patient denies hyperglycemic symptoms ? ?How often are you checking your blood sugar? once daily ? ?What are your blood sugars ranging?  ?Fasting: 115, 136, 145, 160 (2 hours later 133) ?Before meals: None ?After meals: None ?Bedtime: None ? ?During the week, how often does your blood glucose drop below 70? Never ? ?Are you checking your feet daily/regularly? Daily ? ?Adherence Review: ?Is the patient currently on a STATIN medication? No ?Is the patient currently on ACE/ARB medication? No ?Does the patient have >5 day gap between last estimated fill dates? Yes ? ?NOTES: ?Patient stated she hasn't been drinking water the past 2 days because she has been busy. Patient will start back on her water regimen today. Patient is still prescribed metformin but refuses to take. ? ?Care Gaps: ?Shingrix overdue ?PAP smear overdue ?Covid booster overdue ?AWV 09-02-2022 ? ?Star Rating Drugs: ?Metformin 500 mg- Last filled 11-21-2021 [facility].  ? ?Jeannette How CMA ?Clinical Pharmacist Assistant ?[phone] ? ? ?Pharmacist follow up - on 11/22/2021 Patient denied delivery of Metformin because she is not taking it and doesn't think she needs to be on it right  now.  I called patient to follow up again on 12/31/2021, please see note patient still requests to not be on medication at this time.  ? ?[name], CPP, PharmD ?Clinical Pharmacist Practitioner ?[facility] ?[phone] ? ?

## 2021-12-20 ENCOUNTER — Telehealth: Payer: BC Managed Care – PPO

## 2021-12-31 ENCOUNTER — Ambulatory Visit: Payer: BC Managed Care – PPO | Admitting: Neurology

## 2021-12-31 ENCOUNTER — Telehealth: Payer: Self-pay

## 2021-12-31 NOTE — Telephone Encounter (Signed)
?  Chronic Care Management  ? ?Telephone call  ? ? ?12/31/2021 ?Name: [name] MRN: [id number] DOB: 10/24/1965 ? ?Referred by: [name], FNP ?Reason for referral : No chief complaint on file. ? ? ?[name] is a 56 y.o. year old female who is a primary care patient of [name], [place]. The CCM team was consulted for assistance with chronic disease management and care coordination needs.   ? ?Review of patient status, including review of consultants reports, relevant laboratory and other test results, and collaboration with appropriate care team members and the patient's provider was performed as part of comprehensive patient evaluation and provision of chronic care management services.   ? ?Objectives: Patient reports that she is not taking any medication at this time. Per pharmacy patient refused Metformin. Informed PCP team. Will try to follow up with patient but at this time would prefer to manage her diabetes without medication.  Will follow up based on patients need.  ? ?Plan:  ? ?The patient has been provided with contact information for the care management team and has been advised to call with any health related questions or concerns.  ? ? ?[name], CPP, PharmD ?Clinical Pharmacist Practitioner ?[facility] ?[phone] ? ? ? ?

## 2022-01-02 ENCOUNTER — Ambulatory Visit (INDEPENDENT_AMBULATORY_CARE_PROVIDER_SITE_OTHER): Payer: BC Managed Care – PPO

## 2022-01-02 ENCOUNTER — Telehealth: Payer: BC Managed Care – PPO

## 2022-01-02 DIAGNOSIS — R609 Edema, unspecified: Secondary | ICD-10-CM

## 2022-01-02 DIAGNOSIS — G35 Multiple sclerosis: Secondary | ICD-10-CM

## 2022-01-02 DIAGNOSIS — E782 Mixed hyperlipidemia: Secondary | ICD-10-CM

## 2022-01-02 DIAGNOSIS — E1165 Type 2 diabetes mellitus with hyperglycemia: Secondary | ICD-10-CM

## 2022-01-03 NOTE — Patient Instructions (Signed)
Visit Information ? ?Thank you for taking time to visit with me today. Please don't hesitate to contact me if I can be of assistance to you before our next scheduled telephone appointment. ? ?Following are the goals we discussed today:  ?(Copy and paste patient goals from clinical care plan here) ? ?Our next appointment is by telephone on 03/10/22 at 12 PM ? ?Please call the care guide team at [phone] if you need to cancel or reschedule your appointment.  ? ?If you are experiencing a Mental Health or [place] or need someone to talk to, please call [phone]-TALK (toll free, 24 hour hotline)  ? ?Patient verbalizes understanding of instructions and care plan provided today and agrees to view in [place]. Active MyChart status confirmed with patient.   ? ?Barb Merino, RN, BSN, CCM ?Care Management Coordinator ?[place] [facility]  ?Direct Phone: [phone] ? ? ?

## 2022-01-03 NOTE — Chronic Care Management (AMB) (Addendum)
?Chronic Care Management  ? ?CCM RN Visit Note ? ?01/02/2022 ?Name: [name] MRN: [id number] DOB: [birth year] ? ?Subjective: ?[name] is a 56 y.o. year old female who is a primary care patient of [name], [place]. The care management team was consulted for assistance with disease management and care coordination needs.   ? ?Engaged with patient by telephone for follow up visit in response to provider referral for case management and/or care coordination services.  ? ?Consent to Services:  ?The patient was given information about Chronic Care Management services, agreed to services, and gave verbal consent prior to initiation of services.  Please see initial visit note for detailed documentation.  ? ?Patient agreed to services and verbal consent obtained.  ? ?Assessment: Review of patient past medical history, allergies, medications, health status, including review of consultants reports, laboratory and other test data, was performed as part of comprehensive evaluation and provision of chronic care management services.  ? ?SDOH (Social Determinants of Health) assessments and interventions performed:  Yes, no acute changes ? ?CCM Care Plan ? ?Allergies  ?Allergen Reactions  ? Food Other (See Comments)  ?  WHAT FOOD CAUSES THIS REACTION? ?Eyes swelling and throat swelling  ? Oxycodone Hcl Hives  ? Soy Allergy   ?  Other reaction(s): SWELLING  ? ? ?Outpatient Encounter Medications as of 01/02/2022  ?Medication Sig  ? Blood Glucose Monitoring Suppl (FREESTYLE LITE) w/Device KIT Inject 1 each into the skin 2 (two) times daily. dxE11.65  ? Cholecalciferol (VITAMIN D3) 50 MCG (2000 UT) capsule TAKE 1 CAPSULE BY MOUTH EVERY DAY AS DIRECTED  ? [facility] B12 QUICK DISSOLVE 500 MCG LOZG TAKE 2 TABS BY MOUTH DAILY  ? Dimethyl Fumarate 240 MG CPDR Take 1 capsule (240 mg total) by mouth 2 (two) times daily.  ? furosemide (LASIX) 20 MG tablet Take 1 tab by mouth daily as needed for leg edema  ? glucose blood (FREESTYLE  LITE) test strip Use as instructed to check blood sugars 2 times per day dx:e11.65  ? ketoconazole (NIZORAL) 2 % cream Apply 1 application topically daily. (Patient not taking: Reported on 08/29/2021)  ? metFORMIN (GLUCOPHAGE) 500 MG tablet Take 1 tablet (500 mg total) by mouth 2 (two) times daily with a meal.  ? Microlet Lancets MISC Use as directed to check blood sugars 2 times per day dx: e11.65  ? OVER THE COUNTER MEDICATION [place] food ?1 - 2 capsules daily  ? Probiotic Product (PROBIOTIC PO) Take 1 Dose by mouth daily.  ? ?No facility-administered encounter medications on file as of 01/02/2022.  ? ? ?Patient Active Problem List  ? Diagnosis Date Noted  ? Patient refuses to take medication 12/09/2021  ? Edema 08/29/2020  ? Lymphedema 08/02/2020  ? Stress 08/02/2020  ? Gait abnormality 08/02/2020  ? Acute urinary tract infection 10/28/2018  ? High risk medication use 10/21/2016  ? Urinary frequency 04/22/2016  ? Multiple sclerosis ([place]) 02/01/2015  ? Gait disturbance 02/01/2015  ? Left foot drop 02/01/2015  ? Other fatigue 02/01/2015  ? DS (disseminated sclerosis) ([place]) 01/06/2013  ? Avitaminosis D 01/06/2013  ? ? ?Conditions to be addressed/monitored: Diabetes Mellitus due to underlying condition with type 1 chronic kidney disease; Multiple Sclerosis, Mixed Hyperlipidemia, Edema unspecified  ? ?Care Plan : RN Care Manager Plan of Care  ?Updates made by Lynne Logan, RN since 01/02/2022 12:00 AM  ?  ? ?Problem: No plan established for management of chronic disease states (Diabetes Mellitus  due to underlying condition with type 1 chronic kidney disease; Multiple Sclerosis, Mixed Hyperlipidemia, Edema unspecified)   ?Priority: High  ?  ? ?Long-Range Goal: Development of plan of care for chronic disease management for Diabetes Mellitus due to underlying condition with type 1 chronic kidney disease; Multiple Sclerosis, Mixed Hyperlipidemia, Edema unspecified   ?Start Date: 07/16/2021  ?Expected End  Date: 07/16/2022  ?Recent Progress: On track  ?Priority: High  ?Note:   ?Current Barriers:  ?Knowledge Deficits related to plan of care for management of Diabetes Mellitus due to underlying condition with type 1 chronic kidney disease; Multiple Sclerosis, Mixed Hyperlipidemia, Edema unspecified   ?Chronic Disease Management support and education needs related to Diabetes Mellitus due to underlying condition with type 1 chronic kidney disease; Multiple Sclerosis, Mixed Hyperlipidemia, Edema unspecified   ? ?RNCM Clinical Goal(s):  ?Patient will verbalize basic understanding of  Diabetes Mellitus due to underlying condition with type 1 chronic kidney disease; Multiple Sclerosis, Mixed Hyperlipidemia, Edema unspecified   disease process and self health management plan   ?take all medications exactly as prescribed and will call provider for medication related questions ?demonstrate Improved health management independence   ?continue to work with RN Care Manager to address care management and care coordination needs related to  Diabetes Mellitus due to underlying condition with type 1 chronic kidney disease; Multiple Sclerosis, Mixed Hyperlipidemia, Edema unspecified   ?will demonstrate ongoing self health care management ability    through collaboration with RN Care manager, provider, and care team.  ? ?Interventions: ?1:1 collaboration with primary care provider regarding development and update of comprehensive plan of care as evidenced by provider attestation and co-signature ?Inter-disciplinary care team collaboration (see longitudinal plan of care) ?Evaluation of current treatment plan related to  self management and patient's adherence to plan as established by provider ? ?Edema to Lower Extremities: Status: (Goal on track:  Yes.) Long Term Goal ?Evaluation of current treatment plan related to  Edema to lower extremities , self-management and patient's adherence to plan as established by provider ?Determined patient  continues to have edema to her lower extremities, she is wearing compression socks daily and elevating while resting ?Educated patient providing rationale, the importance to continue to wear compression socks to help reduce her edema and reduce the risk for impaired skin integrity and or DVT ?Provided education to patient re: the importance of adherence to low Sodium diet; increasing water intake, shooting for 48-64 oz daily unless otherwise directed ?Discussed plans with patient for ongoing care management follow up and provided patient with direct contact information for care management team; ? ?Diabetes Interventions:  (Status:  Goal on track:  Yes.) Long Term Goal ?Assessed patient's understanding of A1c goal: <6.5% ?Provided education to patient about basic DM disease process ?Reviewed medications with patient and discussed importance of medication adherence ?Counseled on importance of regular laboratory monitoring as prescribed ?Provided patient with written educational materials related to hypo and hyperglycemia and importance of correct treatment ?Advised patient, providing education and rationale, to check cbg FBS before breakfast and at bedtime and record, calling PCP for findings outside established parameters ?Review of patient status, including review of consultants reports, relevant laboratory and other test results, and medications completed ?Educated patient on dietary and exercise recommendations; daily glycemic control FBS 80-130, <180 after meals;15'15' rule ?Mailed printed educational materials related to Diabetes Care Schedule; What is Diabetes?; Preventing Complications from Diabetes ?Lab Results  ?Component Value Date  ? HGBA1C 6.6 (H) 11/19/2021  ? ?Relapsing Remitting Multiple  Sclerosis Interventions: Status: (Goal on track:  Yes) Long Term Goal ?Evaluation of current treatment plan related to  Multiple Sclerosis , self-management and patient's adherence to plan as established by  provider. ?Review of patient status, including review of consultant's reports, relevant laboratory and other test results, and medications completed. ?Reviewed medications with patient and discussed importance of m

## 2022-01-06 ENCOUNTER — Telehealth: Payer: Self-pay

## 2022-01-06 NOTE — Chronic Care Management (AMB) (Signed)
    Chronic Care Management Pharmacy Assistant   Name: [name]  MRN: [id number] DOB: 10-21-1965   Reason for Encounter: Medication Review/ Medication Coordination   Recent office visits:  01-02-2022 Little, Claudette Stapler, RN (CCM)  Recent consult visits:  None  Hospital visits:  None in previous 6 months  Medications: Outpatient Encounter Medications as of 01/06/2022  Medication Sig   Blood Glucose Monitoring Suppl (FREESTYLE LITE) w/Device KIT Inject 1 each into the skin 2 (two) times daily. dxE11.65   Cholecalciferol (VITAMIN D3) 50 MCG (2000 UT) capsule TAKE 1 CAPSULE BY MOUTH EVERY DAY AS DIRECTED   [facility] B12 QUICK DISSOLVE 500 MCG LOZG TAKE 2 TABS BY MOUTH DAILY   Dimethyl Fumarate 240 MG CPDR Take 1 capsule (240 mg total) by mouth 2 (two) times daily.   furosemide (LASIX) 20 MG tablet Take 1 tab by mouth daily as needed for leg edema   glucose blood (FREESTYLE LITE) test strip Use as instructed to check blood sugars 2 times per day dx:e11.65   ketoconazole (NIZORAL) 2 % cream Apply 1 application topically daily. (Patient not taking: Reported on 08/29/2021)   metFORMIN (GLUCOPHAGE) 500 MG tablet Take 1 tablet (500 mg total) by mouth 2 (two) times daily with a meal.   Microlet Lancets MISC Use as directed to check blood sugars 2 times per day dx: e11.65   OVER THE COUNTER MEDICATION [place] food 1 - 2 capsules daily   Probiotic Product (PROBIOTIC PO) Take 1 Dose by mouth daily.   No facility-administered encounter medications on file as of 01/06/2022.   Reviewed chart for medication changes ahead of medication coordination call.  No OVs, Consults, or hospital visits since last care coordination call/Pharmacist visit. (If appropriate, list visit date, provider name)  No medication changes indicated OR if recent visit, treatment plan here.  BP Readings from Last 3 Encounters:  11/19/21 124/76  09/11/21 130/82  05/01/21 132/80    Lab Results  Component  Value Date   HGBA1C 6.6 (H) 11/19/2021     Patient obtains medications through Vials  30 Days   Last adherence delivery included:  Lasix 20 mg daily as needed Embrace talk test strips  Patient declined (meds) last month: Metformin- Medication prescribed but patient refuses to take.  Patient is due for next adherence delivery on: 01-16-2022  Called patient and reviewed medications and coordinated delivery.  This delivery to include: None  No acute/short fill needed  Patient declined the following medications: Lasix 20 mg daily as needed- Patient has plenty supply Embrace talk test strips- Patient has plenty supply  Patient needs refills for: None  Patient will not receive delivery scheduled for 01-16-2022.  [place] Pharmacist Assistant [phone]

## 2022-01-12 DIAGNOSIS — E782 Mixed hyperlipidemia: Secondary | ICD-10-CM

## 2022-01-12 DIAGNOSIS — E1165 Type 2 diabetes mellitus with hyperglycemia: Secondary | ICD-10-CM

## 2022-01-14 ENCOUNTER — Telehealth: Payer: Self-pay | Admitting: Neurology

## 2022-01-14 NOTE — Telephone Encounter (Signed)
Called pt. Advised I reviewed her chart.  Last in office visit was 08/02/2020. She had VV 10/25/20 and 06/27/21. She would need to come in for office visit for this next appt. She verbalized understanding.  I rescheduled appt to be sooner on 02/12/22 at 2:30pm with Dr. Felecia Shelling. Cx appt in August.  ?

## 2022-01-14 NOTE — Telephone Encounter (Signed)
Pt requesting to move her 04/23/22 office visit to virtual mychart.  ?Pt needs transportation from her daughter and will not have this on 04/23/22.  ?Pt is having no new symptoms or medication issues.  ?Would like call back  ?

## 2022-02-04 ENCOUNTER — Other Ambulatory Visit: Payer: Self-pay | Admitting: Neurology

## 2022-02-04 ENCOUNTER — Telehealth: Payer: Self-pay

## 2022-02-04 NOTE — Chronic Care Management (AMB) (Addendum)
    Chronic Care Management Pharmacy Assistant   Name: [name]  MRN: [id number] DOB: 26-Mar-1966  Reason for Encounter: Medication Review/ Medication coordination  Recent office visits:  None  Recent consult visits:  None  Hospital visits:  None in previous 6 months  Medications: Outpatient Encounter Medications as of 02/04/2022  Medication Sig   Blood Glucose Monitoring Suppl (FREESTYLE LITE) w/Device KIT Inject 1 each into the skin 2 (two) times daily. dxE11.65   Cholecalciferol (VITAMIN D3) 50 MCG (2000 UT) capsule TAKE 1 CAPSULE BY MOUTH EVERY DAY AS DIRECTED   [facility] B12 QUICK DISSOLVE 500 MCG LOZG TAKE 2 TABS BY MOUTH DAILY   Dimethyl Fumarate 240 MG CPDR Take 1 capsule (240 mg total) by mouth 2 (two) times daily.   furosemide (LASIX) 20 MG tablet Take 1 tab by mouth daily as needed for leg edema   glucose blood (FREESTYLE LITE) test strip Use as instructed to check blood sugars 2 times per day dx:e11.65   ketoconazole (NIZORAL) 2 % cream Apply 1 application topically daily. (Patient not taking: Reported on 08/29/2021)   metFORMIN (GLUCOPHAGE) 500 MG tablet Take 1 tablet (500 mg total) by mouth 2 (two) times daily with a meal.   Microlet Lancets MISC Use as directed to check blood sugars 2 times per day dx: e11.65   OVER THE COUNTER MEDICATION [place] food 1 - 2 capsules daily   Probiotic Product (PROBIOTIC PO) Take 1 Dose by mouth daily.   No facility-administered encounter medications on file as of 02/04/2022.   Reviewed chart for medication changes ahead of medication coordination call.  No OVs, Consults, or hospital visits since last care coordination call/Pharmacist visit.   No medication changes indicated OR if recent visit, treatment plan here.  BP Readings from Last 3 Encounters:  11/19/21 124/76  09/11/21 130/82  05/01/21 132/80    Lab Results  Component Value Date   HGBA1C 6.6 (H) 11/19/2021     Patient obtains medications through  Vials  30 Days   Last adherence delivery included:  None  Patient declined (meds) last month: Lasix 20 mg daily as needed- Patient has plenty supply Embrace talk test strips- Patient has plenty supply  Patient is due for next adherence delivery on: 02-14-2022  Called patient and reviewed medications and coordinated delivery.  This delivery to include: Microlet lancets Embrace talk test strips  No acute/short fill needed  Patient declined the following medications: Lasix- Patient stated she isn't taking medication  Patient needs refills for: rx request sent to PCP  NOTES: Patient stated she stopped lasix on her own. Patient reports not taking any medications by mouth and is managing her health with lifestyle changes. Patient is in need of lancets. Informed patient that [facility] has never filled lancets and to [facility] see if [facility]ills are [facility] since they were last filled on 06-06-2021. Patient stated she would like to switch lancets over to [facility] if there's no copay. [facility] and will call me back on 02-05-2022. Sent a request to PCP for lancet rx to be sent to [facility]. Informed [name] and [name] about lasix.  Confirmed delivery date of 02-14-2022 advised patient that pharmacy will contact them the morning of delivery.   [place] Pharmacist Assistant [phone]

## 2022-02-07 ENCOUNTER — Other Ambulatory Visit: Payer: Self-pay

## 2022-02-07 MED ORDER — MICROLET LANCETS MISC: 11 refills | Status: AC

## 2022-02-12 ENCOUNTER — Encounter: Payer: Self-pay | Admitting: Neurology

## 2022-02-12 ENCOUNTER — Ambulatory Visit (INDEPENDENT_AMBULATORY_CARE_PROVIDER_SITE_OTHER): Payer: BC Managed Care – PPO | Admitting: Neurology

## 2022-02-12 VITALS — BP 162/93 | HR 96 | Ht 67.0 in | Wt 279.0 lb

## 2022-02-12 DIAGNOSIS — G35 Multiple sclerosis: Secondary | ICD-10-CM

## 2022-02-12 DIAGNOSIS — R269 Unspecified abnormalities of gait and mobility: Secondary | ICD-10-CM | POA: Diagnosis not present

## 2022-02-12 DIAGNOSIS — R350 Frequency of micturition: Secondary | ICD-10-CM | POA: Diagnosis not present

## 2022-02-12 DIAGNOSIS — R5383 Other fatigue: Secondary | ICD-10-CM

## 2022-02-12 DIAGNOSIS — Z79899 Other long term (current) drug therapy: Secondary | ICD-10-CM | POA: Diagnosis not present

## 2022-02-12 NOTE — Progress Notes (Signed)
[facility]  PATIENT: [name] DOB: [birth year]  REFERRING CLINICIAN: Willey Blade HISTORY FROM: Patient and mother REASON FOR VISIT: Multiple sclerosis   HISTORICAL  CHIEF COMPLAINT:  Chief Complaint  Patient presents with   Follow-up    Rm 1, alone. Here to f/u for MS, on Tecfidera and tolerating well. Right leg weaker and more stiff than the left leg.     HISTORY OF PRESENT ILLNESS:  Ms. Randle is a 56 y.o. woman with relapsing remitting MS diagnosed in 1997.    Update 02/12/2022: She is on Tecfidera and tolerates it well.   She feels stable on Tecfidera 240 mg bid     Labs 05/01/2021 showed 1.4 lymphocyte count.    She feels her MS has been stable.  She has no exacerbations or new symptoms.   Gait is the same.   She uses a walker and can go 100 feet, sometimes more, about the same as earlier this year.  She has bilateral leg weakness.  She has some mild spasticity. She denies any weakness in her hands/arms but has som muscle aches.   She has ankle edema and is  wearing compression stockings.     She can transfer to shower chair independently.    Bladder is doing well.    Vision is fine.     Fatigue is doing better last fe wmonths than last year.  She sleeps well most well.  She takes occasional naps.    She is eating well.   She takes vitamin D supplements.  She denies depression or anxiety.  Cognition is doing well.    She was diagnosed with NIDDM and was on metformin but stopped as she prefers to lose weight and eat better.      MS History:    In 1997, she presented with poor gait and visual blurring. She had an MRI and a lumbar puncture. The studies were consistent with multiple sclerosis.  She turned down a disease modifying therapy at that time. Her legs and her walking got completely better and she was back at baseline after the steroids. Over the next 15 years or so she had no additional symptoms. She had worsening symptoms in the left leg  that was affecting her balance.  . She opted not to go on medication initially.  She had some progression and started Tecfidera around 2014   Images: MRI Brain 06/17/2021 T2/FLAIR hyperintense foci in the brainstem and hemispheres in a pattern consistent with chronic demyelinating plaque associated with multiple sclerosis.  None of the foci appear to be acute.  Compared to the MRI dated 06/17/2021, there are no new lesions.   Mild cortical and corpus callosum atrophy.   MRI cervical spine 06/17/2021 Multiple T2 hyperintense foci within the spinal cord in a pattern consistent with chronic demyelinating plaque associated with multiple sclerosis.  Compared to the MRI from 06/06/2015, allowing for differences in technique, there do not appear to be any new lesions.       At C3-C4, there are degenerative changes causing borderline spinal stenosis and moderately severe right foraminal narrowing with potential for right C4 nerve root compression.   At C5-C6, there are degenerative changes causing mild spinal stenosis and moderate right foraminal narrowing but no nerve root compression.   Milder degenerative changes at C4-C5, and C6-C7 that do not cause spinal stenosis or nerve root compression.   MRI Brain 06/22/2017 shows T2/FLAIR hyperintense foci in the cerebral hemispheres and brainstem in a  pattern and configuration consistent with chronic demyelinating plaque associated with multiple sclerosis. None of the foci appears to be acute. When compared to the MRI dated 06/06/2015, there is no interval change.     There is a normal enhancement pattern and there are no acute findings.   MRI cervical spine 06/22/2017 showed several lesions within the spinal cord adjacent to C2, C2-C3, C4 and C7 as described above. None of these enhance after contrast was administered. When compared to the MRI dated 06/06/2015, there has been no definite interval change.     Multilevel degenerative changes as detailed above. There is mild  spinal stenosis at C3-C4.  There is moderate right foraminal narrowing at C3-C4 and at C5-C6. The degenerative changes encroach upon the right C4 and C6 nerve roots though there is no definite nerve root compression.    Compared to the MRI dated 06/06/2015, there is no definite interval change.    There is a normal enhancement pattern.   MRI thoracic spine 06/22/2017 showed hyperintense foci within the spinal cord adjacent to C7, T3, T4, T4-T5, T7, T8-T9, T10 and T11 as described above. None of these appear to enhance after contrast was administered. These are consistent with chronic demyelinating plaque associated with multiple sclerosis.    Small disc protrusion at T7-T8 that does not lead to any nerve root compression.    REVIEW OF SYSTEMS:  Constitutional: No fevers, chills, sweats, or change in appetite Eyes: No visual changes, double vision, eye pain Ear, nose and throat: No hearing loss, ear pain, nasal congestion, sore throat Cardiovascular: No chest pain, palpitations Respiratory:  No shortness of breath at rest or with exertion.   No wheezes GastrointestinaI: No nausea, vomiting, diarrhea, abdominal pain, fecal incontinence Genitourinary:  No dysuria, urinary retention or frequency.  No nocturia. Musculoskeletal:  No neck pain.   She reports some back pain and occasionally proximal leg pain. Integumentary: No rash, pruritus, skin lesions Neurological: as above Psychiatric: She denies depression but notes that she is sometimes moody. There is no anxiety.  She notes some marital stress Endocrine: No palpitations, diaphoresis, change in appetite or increased thirst Hematologic/Lymphatic:  No anemia, purpura, petechiae. Allergic/Immunologic: No itchy/runny eyes, nasal congestion, recent allergic reactions, rashes  ALLERGIES: Allergies  Allergen Reactions   Food Other (See Comments)    WHAT FOOD CAUSES THIS REACTION? Eyes swelling and throat swelling   Oxycodone Hcl Hives   Soy  Allergy     Other reaction(s): SWELLING    HOME MEDICATIONS: Outpatient Medications Prior to Visit  Medication Sig Dispense Refill   APPLE CIDER VINEGAR PO Take 1 tablet by mouth daily. gummies     Blood Glucose Monitoring Suppl (FREESTYLE LITE) w/Device KIT Inject 1 each into the skin 2 (two) times daily. dxE11.65 1 kit 1   Cholecalciferol (VITAMIN D3) 50 MCG (2000 UT) capsule TAKE 1 CAPSULE BY MOUTH EVERY DAY AS DIRECTED 90 capsule 0   CVS B12 QUICK DISSOLVE 500 MCG LOZG TAKE 2 TABS BY MOUTH DAILY 180 lozenge 1   Dimethyl Fumarate 240 MG CPDR Take 1 capsule (240 mg total) by mouth 2 (two) times daily. 60 capsule 5   furosemide (LASIX) 20 MG tablet Take 1 tab by mouth daily as needed for leg edema 30 tablet 11   glucose blood (FREESTYLE LITE) test strip Use as instructed to check blood sugars 2 times per day dx:e11.65 100 each 5   Microlet Lancets MISC Use as directed to check blood sugars 2 times per day  dx: e11.65 100 each 11   OVER THE COUNTER MEDICATION [place] food 1 - 2 capsules daily     ketoconazole (NIZORAL) 2 % cream Apply 1 application topically daily. (Patient not taking: Reported on 08/29/2021) 15 g 1   metFORMIN (GLUCOPHAGE) 500 MG tablet Take 1 tablet (500 mg total) by mouth 2 (two) times daily with a meal. 180 tablet 1   Probiotic Product (PROBIOTIC PO) Take 1 Dose by mouth daily.     No facility-administered medications prior to visit.    PAST MEDICAL HISTORY: Past Medical History:  Diagnosis Date   Movement disorder    Multiple sclerosis ([place])     PAST SURGICAL HISTORY: Past Surgical History:  Procedure Laterality Date   CESAREAN SECTION     UTERINE FIBROID SURGERY     WISDOM TOOTH EXTRACTION      FAMILY HISTORY: Family History  Problem Relation Age of Onset   Hypertension Mother    Hypertension Father    Diabetes Father     SOCIAL HISTORY:  Social History   Socioeconomic History   Marital status: Married    Spouse name: Not on  file   Number of children: Not on file   Years of education: Not on file   Highest education level: Not on file  Occupational History   Not on file  Tobacco Use   Smoking status: Never   Smokeless tobacco: Never  Vaping Use   Vaping Use: Never used  Substance and Sexual Activity   Alcohol use: No   Drug use: No   Sexual activity: Not Currently  Other Topics Concern   Not on file  Social History Narrative   Not on file   Social Determinants of Health   Financial Resource Strain: Low Risk    Difficulty of Paying Living Expenses: Not hard at all  Food Insecurity: No Food Insecurity   Worried About Charity fundraiser in the Last Year: Never true   [place] in the Last Year: Never true  Transportation Needs: No Transportation Needs   Lack of Transportation (Medical): No   Lack of Transportation (Non-Medical): No  Physical Activity: Inactive   Days of Exercise per Week: 0 days   Minutes of Exercise per Session: 0 min  Stress: No Stress Concern Present   Feeling of Stress : Not at all  Social Connections: Not on file  Intimate Partner Violence: Not on file     PHYSICAL EXAM  Vitals:   02/12/22 1451  BP: (!) 162/93  Pulse: 96  Weight: 279 lb (126.6 kg)  Height: _0  (1.702 m)     Body mass index is 43.7 kg/m.   General: The patient is well-developed and well-nourished and in no acute distress.   Lymphedema bilaterally   Neurologic Exam  Mental status: The patient is alert and oriented x 3 at the time of the examination. The patient has apparent normal recent and remote memory, with an apparently normal attention span and concentration ability.   Speech is normal.  Cranial nerves: She has nystagmus consistent with mild bilateral INO's .  Facial symmetry is present. Facial strength and sensation is normal. Trapezius and sternocleidomastoid strength is normal. No dysarthria is noted.   No obvious hearing deficits are noted.  Motor:  Muscle bulk is normal.   Muscle tone is increased in the legs, left greater than right.  Muscle tone was fairly normal in the arms.  Strength was normal in the  right arm and 4+/5 in the upper left arm and 5/5 in the hand.  She as reduced rapid alternating movements in the right hand relative left.  In the legs, she has reduced strength bilaterally , 2+/5 hip extension on the right with 35 right quads and 3 to 4-/5  elsewhere on the left leg  Sensory: In the arms, there was normal sensation to touch and vibration. She had normal touch sensation in the legs but reduced vibratory sensation in the left leg.    Coordination: Cerebellar testing reveals good finger-nose-finger and is unable to do heel-to-shin  Gait and station: She needs strong bilateral support to stand and can walk with walker but not independently  Reflexes: Deep tendon reflexes are increased in knees relative to arms.     DIAGNOSTIC DATA (LABS, IMAGING, TESTING) - I reviewed patient records, labs, notes, testing and imaging myself where available.  Lab Results  Component Value Date   WBC 6.7 11/19/2021   HGB 14.8 11/19/2021   HCT 43.1 11/19/2021   MCV 87 11/19/2021   PLT 236 11/19/2021      ASSESSMENT AND PLAN  Multiple sclerosis ([place]) - Plan: CBC with Differential/Platelet  Gait abnormality  High risk medication use - Plan: CBC with Differential/Platelet  Other fatigue  Urinary frequency  1.   She has had sone progression over the last 2 years walking shorter distance.   Continue Tecfidera for now.   She has SPMS.  No recent exacerbation  Consider a BTKi if available next year or if we do a SPMS clinical trial.  We will check a CBC with differential to determine if there is any lymphopenia. 2.   Continue supplementary vitamin D and B12.. 3.   She will use a walker around the house and for short distance outside.  And use wheelchair for longer distance.  outside the house. 4.    rtc 6 months, sooner if problems    40-minute office  visit with the majority of the time spent face-to-face for history and physical, discussion/counseling and decision-making.  Additional time with record review and documentation.  Sirena Riddle A. Felecia Shelling, MD, PhD 01/18/1832, 5:82 PM Certified in Neurology, Clinical Neurophysiology, Sleep Medicine, Pain Medicine and Neuroimaging  [facility] [address], [place], [place] [address] [phone]

## 2022-02-13 LAB — CBC WITH DIFFERENTIAL/PLATELET
Basophils Absolute: 0 10*3/uL (ref 0.0–0.2)
Basos: 0 %
EOS (ABSOLUTE): 0.1 10*3/uL (ref 0.0–0.4)
Eos: 2 %
Hematocrit: 39.8 % (ref 34.0–46.6)
Hemoglobin: 13.6 g/dL (ref 11.1–15.9)
Immature Grans (Abs): 0 10*3/uL (ref 0.0–0.1)
Immature Granulocytes: 0 %
Lymphocytes Absolute: 1.9 10*3/uL (ref 0.7–3.1)
Lymphs: 21 %
MCH: 30.2 pg (ref 26.6–33.0)
MCHC: 34.2 g/dL (ref 31.5–35.7)
MCV: 88 fL (ref 79–97)
Monocytes Absolute: 0.8 10*3/uL (ref 0.1–0.9)
Monocytes: 9 %
Neutrophils Absolute: 6 10*3/uL (ref 1.4–7.0)
Neutrophils: 68 %
Platelets: 264 10*3/uL (ref 150–450)
RBC: 4.51 x10E6/uL (ref 3.77–5.28)
RDW: 13.5 % (ref 11.7–15.4)
WBC: 8.9 10*3/uL (ref 3.4–10.8)

## 2022-02-17 ENCOUNTER — Telehealth: Payer: Self-pay

## 2022-02-17 NOTE — Chronic Care Management (AMB) (Signed)
   [name] was reminded to have all medications, supplements and any blood glucose and blood pressure readings available for review with [name], Pharm. D, at her telephone visit on 02-19-2022 at 11:00.   [place] Pharmacist Assistant [phone]

## 2022-02-19 ENCOUNTER — Telehealth: Payer: Self-pay

## 2022-02-19 ENCOUNTER — Ambulatory Visit (INDEPENDENT_AMBULATORY_CARE_PROVIDER_SITE_OTHER): Payer: BC Managed Care – PPO

## 2022-02-19 DIAGNOSIS — E782 Mixed hyperlipidemia: Secondary | ICD-10-CM

## 2022-02-19 DIAGNOSIS — E1165 Type 2 diabetes mellitus with hyperglycemia: Secondary | ICD-10-CM

## 2022-02-19 MED ORDER — RYBELSUS 3 MG PO TABS: 3.0000 mg | ORAL_TABLET | Freq: Every day | ORAL | 0 refills | Status: DC

## 2022-02-19 NOTE — Patient Instructions (Signed)
Visit Information It was great speaking with you today!  Please let me know if you have any questions about our visit.   Goals Addressed             This Visit's Progress    Manage My Medicine       Timeframe:  Long-Range Goal Priority:  High Start Date:                             Expected End Date:                       Follow Up Date 04/08/2022   In Progress:   - call for medicine refill 2 or 3 days before it runs out - call if I am sick and can't take my medicine - keep a list of all the medicines I take; vitamins and herbals too - use a pillbox to sort medicine - use an alarm clock or phone to remind me to take my medicine    Why is this important?   These steps will help you keep on track with your medicines.   Notes:  Please contact [name] if you have any questions about your medications        Patient Care Plan: CCM Pharmacy Care Plan     Problem Identified: HLD, DM II      Long-Range Goal: Disease Management   Recent Progress: On track  Note:   Current Barriers:  Unable to independently monitor therapeutic efficacy Does not adhere to prescribed medication regimen  Pharmacist Clinical Goal(s):  Patient will achieve adherence to monitoring guidelines and medication adherence to achieve therapeutic efficacy through collaboration with PharmD and provider.   Interventions: 1:1 collaboration with [name], FNP regarding development and update of comprehensive plan of care as evidenced by provider attestation and co-signature Inter-disciplinary care team collaboration (see longitudinal plan of care) Comprehensive medication review performed; medication list updated in electronic medical record  Query Hypertension (BP goal <130/80) -Not ideally controlled -Current treatment: Not currently taking any medication for BP, she reports that she is not interested in taking anti-hypertensive medications   -Current home readings: she is going to start checking her  BP at home twice per day.  -Denies hypotensive/hypertensive symptoms -Educated on BP goals and benefits of medications for prevention of heart attack, stroke and kidney damage; Daily salt intake goal < 2300 mg; Importance of home blood pressure monitoring; Proper BP monitoring technique; -Counseled to monitor BP at home twice per day, document, and provide log at future appointments -Counseled on the importance of checking BP at the same time twice per day   Hyperlipidemia: (LDL goal < 70) -Not ideally controlled -Current treatment: Not currently on any medication for cholesterol At this time patient would not like to be started on medication for cholesterol because she does not want to be put on medicine after medicine -Medications previously tried: none taken previously   -Current dietary patterns: Patient reports hat she trying to cut out fried foods from her diet. She reports eating fried fish, instead of fried chicken. She does not eat a lot of red meat. She usually has chicken or fish.  -Current exercise habits: she does therapy and does exercises there.  -Educated on Benefits of statin for ASCVD risk reduction; Importance of limiting foods high in cholesterol; -Recommended to continue current medication  Diabetes (A1c goal <7%) -Not ideally controlled -Current medications: Rybelsus  3 mg once per day. Appropriate, Query effective -Medications previously tried: Metformin 500 mg ER - patient has denied this medication multiple times, she is concerned about using this medication  -Current home glucose readings fasting glucose: 6/6- 165, 170, 162 -Denies hypoglycemic/hyperglycemic symptoms -Current meal patterns:  -Current exercise: physical therapy  -Educated on A1c and blood sugar goals; Complications of diabetes including kidney damage, retinal damage, and cardiovascular disease; -Tried to communicate with patient the importance of taking medication daily, and its  benefits -Patient reports that is open to trying a different medication  -We discussed in great detail how medications are chosen, we discussed the use of guidelines and how choices are made for medications. [name] is open to starting a different medication and would like to know more about Januvia because her brother is taking it. She asked me why Metformin does not have any commercials, and I discussed that Metformin is an older medication so they do not have advertisements. I did state that if she was uncomfortable with taking any medication if she lets [name] know we can try another medication. I discussed in detail the options of medications based on her diagnosis of morbid obesity, and diabetes. Recommended Ozempic, once weekly shot, at this time, [name] is not comfortable with that choice. She would like to be started on medication that she can take by mouth. I explained how Rybelsus is taken, and counseled her on the potential side effects. She agreed that this was a good choice but she does not want to be too skinny. I assured her that we can see how she does with Rybelsus and if she is not comfortable, we can change her medication to another option. We discussed patient assistance and its benefit. We also discussed how she will start taking the Rybelsus 3 mg tablet once per day as a sample and then increase to 7 mg tablet daily after 30 days. She voiced understanding.  -Counseled to check feet daily and get yearly eye exams -We discussed the importance of her taking her medication every day  -Recommended to continue current medication Collaborated with PCP team to discuss her current options for medications. PCP agreed that patent can start Rybelsus 3 mg tablet daily.        Patient agreed to services and verbal consent obtained.   The patient verbalized understanding of instructions, educational materials, and care plan provided today and agreed to receive a mailed copy of patient  instructions, educational materials, and care plan.   [name], PharmD Clinical Pharmacist [facility] [phone]

## 2022-02-19 NOTE — Addendum Note (Signed)
Addended by: Mayford Knife on: 02/19/2022 05:22 PM   Modules accepted: Orders

## 2022-02-19 NOTE — Chronic Care Management (AMB) (Signed)
    Chronic Care Management Pharmacy Assistant   Name: [name]  MRN: [id number] DOB: 09/23/1965   Reason for Encounter: Patient Assistance Coordination    Patient assistance application filled out for Rybelsus 3 mg with [employer] patient assistance program. Sent to [name] to print. Called patient to inform that Rybelsus sample was available for pick up and will be at the front desk. Will have application and blood pressure log handout for patient to pick up. Patient stated her daughter will be by the office tomorrow to pick up.   Medications: Outpatient Encounter Medications as of 02/19/2022  Medication Sig   APPLE CIDER VINEGAR PO Take 1 tablet by mouth daily. gummies   Blood Glucose Monitoring Suppl (FREESTYLE LITE) w/Device KIT Inject 1 each into the skin 2 (two) times daily. dxE11.65   Cholecalciferol (VITAMIN D3) 50 MCG (2000 UT) capsule TAKE 1 CAPSULE BY MOUTH EVERY DAY AS DIRECTED   [facility]S B12 QUICK DISSOLVE 500 MCG LOZG TAKE 2 TABS BY MOUTH DAILY   Dimethyl Fumarate 240 MG CPDR Take 1 capsule (240 mg total) by mouth 2 (two) times daily.   furosemide (LASIX) 20 MG tablet Take 1 tab by mouth daily as needed for leg edema   glucose blood (FREESTYLE LITE) test strip Use as instructed to check blood sugars 2 times per day dx:e11.65   Microlet Lancets MISC Use as directed to check blood sugars 2 times per day dx: e11.65   OVER THE COUNTER MEDICATION [place] food 1 - 2 capsules daily   No facility-administered encounter medications on file as of 02/19/2022.    [name], [place] Pharmacist Assistant [phone]

## 2022-02-19 NOTE — Progress Notes (Signed)
Chronic Care Management Pharmacy Note  02/19/2022 Name:  [name] MRN:  [id number] DOB:  November 22, 1965  Summary: Patient reports that she is interested in a herbal approach to her diabetes,  Recommendations/Changes made from today's visit: Recommend patient be started on medication for diabetes and cholesterol.  Recommend patient apply for patient assistance for Rybelsus due to cost.  Recommend patient start checking her BP twice per day and writing in a log book.   Plan: She is going to move around more and try to take the rybelsus on a daily schedule. Collaborated with PCP team to start patient on Rybelsus 3 mg tablet once per day. Thirty day sample of medication provided for patient.  Patient is going to start checking her BP twice per day and bring her readings with her to the next office visit on 6/22   Subjective: [name] is an 56 y.o. year old female who is a primary patient of [name], [place].  The CCM team was consulted for assistance with disease management and care coordination needs.    Engaged with patient by telephone for follow up visit in response to provider referral for pharmacy case management and/or care coordination services.   Consent to Services:  The patient was given information about Chronic Care Management services, agreed to services, and gave verbal consent prior to initiation of services.  Please see initial visit note for detailed documentation.   Patient Care Team: [name], FNP as PCP - General (General Practice) Rex Kras Claudette Stapler, RN as Case Manager Mayford Knife, [facility] (Pharmacist)  Recent office visits: 02/12/2022 PCP OV  11/19/2021 PCP OV  Recent consult visits: 02/12/2022 Neurology [facility] visits: None in previous 6 months   Objective:  Lab Results  Component Value Date   CREATININE 0.81 11/19/2021   BUN 9 11/19/2021   EGFR 86 11/19/2021   GFRNONAA 84 02/20/2020   GFRAA 97 02/20/2020   NA 139 11/19/2021    K 3.8 11/19/2021   CALCIUM 9.3 11/19/2021   CO2 20 11/19/2021   GLUCOSE 118 (H) 11/19/2021    Lab Results  Component Value Date/Time   HGBA1C 6.6 (H) 11/19/2021 11:18 AM   HGBA1C 6.2 (H) 09/11/2021 03:27 PM   MICROALBUR 10 09/11/2021 04:27 PM    Last diabetic Eye exam: No results found for: HMDIABEYEEXA  Last diabetic Foot exam: No results found for: HMDIABFOOTEX   Lab Results  Component Value Date   CHOL 165 11/19/2021   HDL 51 11/19/2021   LDLCALC 104 (H) 11/19/2021   TRIG 47 11/19/2021   CHOLHDL 3.2 11/19/2021       Latest Ref Rng & Units 09/11/2021    3:27 PM 05/01/2021    4:38 PM 02/20/2020    4:05 PM  Hepatic Function  Total Protein 6.0 - 8.5 g/dL 8.1   7.2   7.6    Albumin 3.8 - 4.9 g/dL 5.1   4.4   4.3    AST 0 - 40 IU/L 17   13   17     ALT 0 - 32 IU/L 21   27   23     Alk Phosphatase 44 - 121 IU/L 72   80   69    Total Bilirubin 0.0 - 1.2 mg/dL 0.5   0.3   0.3      Lab Results  Component Value Date/Time   TSH 4.120 08/02/2020 04:44 PM   TSH 4.53 03/25/2020 12:00 AM  Latest Ref Rng & Units 02/12/2022    3:49 PM 11/19/2021   11:18 AM 09/11/2021    3:27 PM  CBC  WBC 3.4 - 10.8 x10E3/uL 8.9   6.7   11.7    Hemoglobin 11.1 - 15.9 g/dL 13.6   14.8   15.0    Hematocrit 34.0 - 46.6 % 39.8   43.1   44.9    Platelets 150 - 450 x10E3/uL 264   236   354      Lab Results  Component Value Date/Time   VD25OH 69.0 09/11/2021 03:27 PM   VD25OH 52.2 06/15/2019 04:31 PM    Clinical ASCVD: No  The 10-year ASCVD risk score (Arnett DK, et al., 2019) is: 15.6%   Values used to calculate the score:     Age: 56 years     Sex: Female     Is Non-Hispanic African American: Yes     Diabetic: Yes     Tobacco smoker: No     Systolic Blood Pressure: 462 mmHg     Is BP treated: No     HDL Cholesterol: 51 mg/dL     Total Cholesterol: 165 mg/dL       08/29/2021   11:36 AM 08/29/2020    9:22 AM 02/20/2020    3:20 PM  Depression screen PHQ 2/9  Decreased Interest 0  0 0  Down, Depressed, Hopeless 0 0 0  PHQ - 2 Score 0 0 0     Social History   Tobacco Use  Smoking Status Never  Smokeless Tobacco Never   BP Readings from Last 3 Encounters:  02/12/22 (!) 162/93  11/19/21 124/76  09/11/21 130/82   Pulse Readings from Last 3 Encounters:  02/12/22 96  11/19/21 95  09/11/21 95   Wt Readings from Last 3 Encounters:  02/12/22 279 lb (126.6 kg)  11/19/21 267 lb (121.1 kg)  08/29/21 285 lb (129.3 kg)   BMI Readings from Last 3 Encounters:  02/12/22 43.70 kg/m  11/19/21 41.82 kg/m  08/29/21 44.64 kg/m    Assessment/Interventions: Review of patient past medical history, allergies, medications, health status, including review of consultants reports, laboratory and other test data, was performed as part of comprehensive evaluation and provision of chronic care management services.   SDOH:  (Social Determinants of Health) assessments and interventions performed: Yes  SDOH Screenings   Alcohol Screen: Not on file  Depression (PHQ2-9): Low Risk    PHQ-2 Score: 0  Financial Resource Strain: Low Risk    Difficulty of Paying Living Expenses: Not hard at all  Food Insecurity: No Food Insecurity   Worried About Charity fundraiser in the Last Year: Never true   Ran Out of Food in the Last Year: Never true  Housing: Low Risk    Last Housing Risk Score: 0  Physical Activity: Inactive   Days of Exercise per Week: 0 days   Minutes of Exercise per Session: 0 min  Social Connections: Not on file  Stress: No Stress Concern Present   Feeling of Stress : Not at all  Tobacco Use: Low Risk    Smoking Tobacco Use: Never   Smokeless Tobacco Use: Never   Passive Exposure: Not on file  Transportation Needs: No Transportation Needs   Lack of Transportation (Medical): No   Lack of Transportation (Non-Medical): No    CCM Care Plan  Allergies  Allergen Reactions   Food Other (See Comments)    WHAT FOOD CAUSES THIS REACTION? Eyes swelling  and  throat swelling   Oxycodone Hcl Hives   Soy Allergy     Other reaction(s): SWELLING    Medications Reviewed Today     Reviewed by Mayford Knife, RPH (Pharmacist) on 02/19/22 at 1710  Med List Status: <None>   Medication Order Taking? Sig Documenting Provider Last Dose Status Informant  APPLE CIDER VINEGAR PO [id number] No Take 1 tablet by mouth daily. gummies [provider] Taking Active   Blood Glucose Monitoring Suppl (FREESTYLE LITE) w/Device KIT [id number] No Inject 1 each into the skin 2 (two) times daily. dxE11.65 [name], NP Taking Active   Cholecalciferol (VITAMIN D3) 50 MCG (2000 UT) capsule [id number] No TAKE 1 CAPSULE BY MOUTH EVERY DAY AS DIRECTED Sater, Nanine Means, MD Taking Active   [facility] B12 QUICK DISSOLVE 500 MCG LOZG [id number] No TAKE 2 TABS BY MOUTH DAILY Sater, Nanine Means, MD Taking Active   Dimethyl Fumarate 240 MG CPDR [id number] No Take 1 capsule (240 mg total) by mouth 2 (two) times daily. Sater, Nanine Means, MD Taking Active   furosemide (LASIX) 20 MG tablet [id number] No Take 1 tab by mouth daily as needed for leg edema [name], FNP Taking Active   glucose blood (FREESTYLE LITE) test strip [id number] No Use as instructed to check blood sugars 2 times per day dx:e11.65 [name], FNP Taking Active   Microlet Lancets MISC [id number] No Use as directed to check blood sugars 2 times per day dx: e11.65 [name], FNP Taking Active   OVER THE COUNTER MEDICATION [id number] No Sea Moss Advanced Super food 1 - 2 capsules daily [provider] Taking Active             Patient Active Problem List   Diagnosis Date Noted   Patient refuses to take medication 12/09/2021   Edema 08/29/2020   Lymphedema 08/02/2020   Stress 08/02/2020   Gait abnormality 08/02/2020   Acute urinary tract infection 10/28/2018   High risk medication use 10/21/2016   Urinary frequency 04/22/2016   Multiple sclerosis ([place]) 02/01/2015   Gait disturbance  02/01/2015   Left foot drop 02/01/2015   Other fatigue 02/01/2015   DS (disseminated sclerosis) ([place]) 01/06/2013   Avitaminosis D 01/06/2013    Immunization History  Administered Date(s) Administered   Influenza,inj,Quad PF,6+ Mos 06/21/2019, 09/11/2021   PFIZER Comirnaty(Gray Top)Covid-19 Tri-Sucrose Vaccine 03/25/2021   PFIZER(Purple Top)SARS-COV-2 Vaccination 02/02/2020, 03/13/2020, 09/25/2020    Conditions to be addressed/monitored:  Hyperlipidemia and Diabetes  Care Plan : [place]  Updates made by Mayford Knife, RPH since 02/19/2022 12:00 AM     Problem: HLD, DM II, Query HTN      Long-Range Goal: Disease Management   Recent Progress: On track  Note:   Current Barriers:  Unable to independently monitor therapeutic efficacy Does not adhere to prescribed medication regimen  Pharmacist Clinical Goal(s):  Patient will achieve adherence to monitoring guidelines and medication adherence to achieve therapeutic efficacy through collaboration with PharmD and provider.   Interventions: 1:1 collaboration with [name], FNP regarding development and update of comprehensive plan of care as evidenced by provider attestation and co-signature Inter-disciplinary care team collaboration (see longitudinal plan of care) Comprehensive medication review performed; medication list updated in electronic medical record  Query Hypertension (BP goal <130/80) -Not ideally controlled -Current treatment: Not currently taking any medication for BP, she reports that she is not interested in taking anti-hypertensive medications   -Current home readings: she is going to start checking  her BP at home twice per day.  -Denies hypotensive/hypertensive symptoms -Educated on BP goals and benefits of medications for prevention of heart attack, stroke and kidney damage; Daily salt intake goal < 2300 mg; Importance of home blood pressure monitoring; Proper BP monitoring  technique; -Counseled to monitor BP at home twice per day, document, and provide log at future appointments -Counseled on the importance of checking BP at the same time twice per day   Hyperlipidemia: (LDL goal < 70) -Not ideally controlled -Current treatment: Not currently on any medication for cholesterol At this time patient would not like to be started on medication for cholesterol because she does not want to be put on medicine after medicine -Medications previously tried: none taken previously   -Current dietary patterns: Patient reports hat she trying to cut out fried foods from her diet. She reports eating fried fish, instead of fried chicken. She does not eat a lot of red meat. She usually has chicken or fish.  -Current exercise habits: she does therapy and does exercises there.  -Educated on Benefits of statin for ASCVD risk reduction; Importance of limiting foods high in cholesterol; -Recommended to continue current medication  Diabetes (A1c goal <7%) -Not ideally controlled -Current medications: Rybelsus 3 mg once per day. Appropriate, Query effective -Medications previously tried: Metformin 500 mg ER - patient has denied this medication multiple times, she is concerned about using this medication  -Current home glucose readings fasting glucose: 6/6- 165, 170, 162 -Denies hypoglycemic/hyperglycemic symptoms -Current meal patterns:  -Current exercise: physical therapy  -Educated on A1c and blood sugar goals; Complications of diabetes including kidney damage, retinal damage, and cardiovascular disease; -Tried to communicate with patient the importance of taking medication daily, and its benefits -Patient reports that is open to trying a different medication  -We discussed in great detail how medications are chosen, we discussed the use of guidelines and how choices are made for medications. Ms. Stewart is open to starting a different medication and would like to know more  about Januvia because her brother is taking it. She asked me why Metformin does not have any commercials, and I discussed that Metformin is an older medication so they do not have advertisements. I did state that if she was uncomfortable with taking any medication if she lets [place] know we can try another medication. I discussed in detail the options of medications based on her diagnosis of morbid obesity, and diabetes. Recommended Ozempic, once weekly shot, at this time, Ms. Pardue is not comfortable with that choice. She would like to be started on medication that she can take by mouth. I explained how Rybelsus is taken, and counseled her on the potential side effects. She agreed that this was a good choice but she does not want to be too skinny. I assured her that we can see how she does with Rybelsus and if she is not comfortable, we can change her medication to another option. We discussed patient assistance and its benefit. We also discussed how she will start taking the Rybelsus 3 mg tablet once per day as a sample and then increase to 7 mg tablet daily after 30 days. She voiced understanding.  -Counseled to check feet daily and get yearly eye exams -We discussed the importance of her taking her medication every day  -Recommended to continue current medication Collaborated with PCP team to discuss her current options for medications. PCP agreed that patent can start Rybelsus 3 mg tablet daily.  Medication Assistance: Application for Rybelsus  3 mg tablet   medication assistance program. in process.  Anticipated assistance start date 03/2022.  See plan of care for additional detail.  Compliance/Adherence/Medication fill history: Care Gaps: Pap Smear Shingrix Vaccine COVID-19 Vaccine  Star-Rating Drugs: Rybelsus 3 mg table   [facility]  [id number] [place] [address]Phone: [phone]:  [phone] [place] [address]Phone: [phone]: [phone] [facility] [place] [place] [place]Phone: [phone]: [phone] Uses pill box? No - patient keeps her medication in bottles  Pt endorses 70% compliance  We discussed: Benefits of medication synchronization, packaging and delivery as well as enhanced pharmacist oversight with [facility]. Patient decided to: Continue current medication management strategy  Care Plan and Follow Up Patient Decision:  Patient agrees to Care Plan and Follow-up.  Plan: The patient has been provided with contact information for the care management team and has been advised to call with any health related questions or concerns.   VOrlando Penner CPP, PharmD Clinical Pharmacist Practitioner [facility] [phone]

## 2022-02-21 ENCOUNTER — Telehealth: Payer: Self-pay

## 2022-02-21 NOTE — Telephone Encounter (Signed)
Returned patients call. Patient has completed paperwork and would like to turn in information for patient assistance for Rybelsus. Spoke with patient briefly and let her know that she can drop of her signed application any time Monday through Thursday. Patient voiced understanding.   [name], CPP, PharmD Clinical Pharmacist Practitioner [facility] [phone]

## 2022-02-25 ENCOUNTER — Ambulatory Visit: Payer: BC Managed Care – PPO | Admitting: Nurse Practitioner

## 2022-03-02 ENCOUNTER — Other Ambulatory Visit: Payer: Self-pay | Admitting: Neurology

## 2022-03-03 ENCOUNTER — Other Ambulatory Visit: Payer: Self-pay | Admitting: Neurology

## 2022-03-04 ENCOUNTER — Telehealth: Payer: Self-pay

## 2022-03-04 NOTE — Chronic Care Management (AMB) (Signed)
    Chronic Care Management Pharmacy Assistant   Name: [name]  MRN: [id number] DOB: [birth year]   Reason for Encounter: Medication Review/ Medication coordination  Recent office visits:  None  Recent consult visits:  None  Hospital visits:  None in previous 6 months  Medications: Outpatient Encounter Medications as of 03/04/2022  Medication Sig   APPLE CIDER VINEGAR PO Take 1 tablet by mouth daily. gummies   Blood Glucose Monitoring Suppl (FREESTYLE LITE) w/Device KIT Inject 1 each into the skin 2 (two) times daily. dxE11.65   Cholecalciferol (VITAMIN D3) 50 MCG (2000 UT) capsule TAKE 1 CAPSULE BY MOUTH EVERY DAY AS DIRECTED   [facility] B12 QUICK DISSOLVE 500 MCG LOZG TAKE 2 TABS BY MOUTH DAILY   Dimethyl Fumarate 240 MG CPDR Take 1 capsule (240 mg total) by mouth 2 (two) times daily.   furosemide (LASIX) 20 MG tablet Take 1 tab by mouth daily as needed for leg edema   glucose blood (FREESTYLE LITE) test strip Use as instructed to check blood sugars 2 times per day dx:e11.65   Microlet Lancets MISC Use as directed to check blood sugars 2 times per day dx: e11.65   OVER THE COUNTER MEDICATION Sea [place] food 1 - 2 capsules daily   Semaglutide (RYBELSUS) 3 MG TABS Take 3 mg by mouth daily.   No facility-administered encounter medications on file as of 03/04/2022.   Reviewed chart for medication changes ahead of medication coordination call.  No OVs, Consults, or hospital visits since last care coordination call/Pharmacist visit.   No medication changes indicated   BP Readings from Last 3 Encounters:  02/12/22 (!) 162/93  11/19/21 124/76  09/11/21 130/82    Lab Results  Component Value Date   HGBA1C 6.6 (H) 11/19/2021     Patient obtains medications through Vials  30 Days   Last adherence delivery included:  Microlet lancets Embrace talk test strips  Patient declined (meds) last month: Lasix- Patient stated she isn't taking medication  Patient is  due for next adherence delivery on: 03-17-2022  Called patient and reviewed medications and coordinated delivery.  This delivery to include: Microlet lancets Embrace talk test strips  No short/acute fill needed  Patient declined the following medications: Microlet lancets- Patient requested to call when running low.  Embrace talk test strips- Patient requested to call when running low.  Patient needs refills for: None  Patient declined 03-17-2022 delivery. Patient will call a week prior to needing delivery.   [place] Pharmacist Assistant [phone]

## 2022-03-06 ENCOUNTER — Ambulatory Visit: Payer: BC Managed Care – PPO | Admitting: Nurse Practitioner

## 2022-03-10 ENCOUNTER — Telehealth: Payer: BC Managed Care – PPO

## 2022-03-10 ENCOUNTER — Ambulatory Visit: Payer: Self-pay

## 2022-03-10 ENCOUNTER — Telehealth: Payer: Self-pay | Admitting: Neurology

## 2022-03-10 DIAGNOSIS — N181 Chronic kidney disease, stage 1: Secondary | ICD-10-CM

## 2022-03-10 DIAGNOSIS — G35D Multiple sclerosis, unspecified: Secondary | ICD-10-CM

## 2022-03-10 DIAGNOSIS — R609 Edema, unspecified: Secondary | ICD-10-CM

## 2022-03-10 DIAGNOSIS — G35 Multiple sclerosis: Secondary | ICD-10-CM

## 2022-03-10 DIAGNOSIS — E782 Mixed hyperlipidemia: Secondary | ICD-10-CM

## 2022-03-10 NOTE — Telephone Encounter (Signed)
Pt said, have brought Bpzone, blood pressure support supplement from [facility] want to know if ok to take. Medication instructed to take on a empty stomach. Would like a call from the nurse

## 2022-03-10 NOTE — Chronic Care Management (AMB) (Signed)
Chronic Care Management   CCM RN Visit Note  03/10/2022 Name: [name] MRN: [id number] DOB: 04/25/1966  Subjective: [name] is a 56 y.o. year old female who is a primary care patient of [name], [place]. The care management team was consulted for assistance with disease management and care coordination needs.    Engaged with patient by telephone for follow up visit in response to provider referral for case management and/or care coordination services.   Consent to Services:  The patient was given information about Chronic Care Management services, agreed to services, and gave verbal consent prior to initiation of services.  Please see initial visit note for detailed documentation.   Patient agreed to services and verbal consent obtained.   Assessment: Review of patient past medical history, allergies, medications, health status, including review of consultants reports, laboratory and other test data, was performed as part of comprehensive evaluation and provision of chronic care management services.   SDOH (Social Determinants of Health) assessments and interventions performed:   CCM Care Plan  Allergies  Allergen Reactions   Food Other (See Comments)    WHAT FOOD CAUSES THIS REACTION? Eyes swelling and throat swelling   Oxycodone Hcl Hives   Soy Allergy     Other reaction(s): SWELLING    Outpatient Encounter Medications as of 03/10/2022  Medication Sig   APPLE CIDER VINEGAR PO Take 1 tablet by mouth daily. gummies   Blood Glucose Monitoring Suppl (FREESTYLE LITE) w/Device KIT Inject 1 each into the skin 2 (two) times daily. dxE11.65   Cholecalciferol (VITAMIN D3) 50 MCG (2000 UT) capsule TAKE 1 CAPSULE BY MOUTH EVERY DAY AS DIRECTED   [facility] B12 QUICK DISSOLVE 500 MCG LOZG TAKE 2 TABS BY MOUTH DAILY   Dimethyl Fumarate 240 MG CPDR Take 1 capsule (240 mg total) by mouth 2 (two) times daily.   furosemide (LASIX) 20 MG tablet Take 1 tab by mouth daily as needed for  leg edema   glucose blood (FREESTYLE LITE) test strip Use as instructed to check blood sugars 2 times per day dx:e11.65   Microlet Lancets MISC Use as directed to check blood sugars 2 times per day dx: e11.65   OVER THE COUNTER MEDICATION [place] food 1 - 2 capsules daily   Semaglutide (RYBELSUS) 3 MG TABS Take 3 mg by mouth daily.   No facility-administered encounter medications on file as of 03/10/2022.    Patient Active Problem List   Diagnosis Date Noted   Patient refuses to take medication 12/09/2021   Edema 08/29/2020   Lymphedema 08/02/2020   Stress 08/02/2020   Gait abnormality 08/02/2020   Acute urinary tract infection 10/28/2018   High risk medication use 10/21/2016   Urinary frequency 04/22/2016   Multiple sclerosis ([place]) 02/01/2015   Gait disturbance 02/01/2015   Left foot drop 02/01/2015   Other fatigue 02/01/2015   DS (disseminated sclerosis) ([place]) 01/06/2013   Avitaminosis D 01/06/2013    Conditions to be addressed/monitored: Diabetes Mellitus due to underlying condition with type 1 chronic kidney disease; Multiple Sclerosis, Mixed Hyperlipidemia, Edema unspecified    Care Plan : RN Care Manager Plan of Care  Updates made by Christina Logan, RN since 03/10/2022 12:00 AM     Problem: No plan established for management of chronic disease states (Diabetes Mellitus due to underlying condition with type 1 chronic kidney disease; Multiple Sclerosis, Mixed Hyperlipidemia, Edema unspecified)   Priority: High     Long-Range Goal: Development of plan  of care for chronic disease management for Diabetes Mellitus due to underlying condition with type 1 chronic kidney disease; Multiple Sclerosis, Mixed Hyperlipidemia, Edema unspecified   Start Date: 07/16/2021  Expected End Date: 07/16/2022  Recent Progress: On track  Priority: High  Note:   Current Barriers:  Knowledge Deficits related to plan of care for management of Diabetes Mellitus due to underlying  condition with type 1 chronic kidney disease; Multiple Sclerosis, Mixed Hyperlipidemia, Edema unspecified   Chronic Disease Management support and education needs related to Diabetes Mellitus due to underlying condition with type 1 chronic kidney disease; Multiple Sclerosis, Mixed Hyperlipidemia, Edema unspecified    RNCM Clinical Goal(s):  Patient will verbalize basic understanding of  Diabetes Mellitus due to underlying condition with type 1 chronic kidney disease; Multiple Sclerosis, Mixed Hyperlipidemia, Edema unspecified   disease process and self health management plan   take all medications exactly as prescribed and will call provider for medication related questions demonstrate Improved health management independence   continue to work with RN Care Manager to address care management and care coordination needs related to  Diabetes Mellitus due to underlying condition with type 1 chronic kidney disease; Multiple Sclerosis, Mixed Hyperlipidemia, Edema unspecified   will demonstrate ongoing self health care management ability    through collaboration with RN Care manager, provider, and care team.   Interventions: 1:1 collaboration with primary care provider regarding development and update of comprehensive plan of care as evidenced by provider attestation and co-signature Inter-disciplinary care team collaboration (see longitudinal plan of care) Evaluation of current treatment plan related to  self management and patient's adherence to plan as established by provider  Edema to Lower Extremities: Status: (Condition stable.  Not addressed this visit.) Long Term Goal Evaluation of current treatment plan related to  Edema to lower extremities , self-management and patient's adherence to plan as established by provider Determined patient continues to have edema to her lower extremities, she is wearing compression socks daily and elevating while resting Educated patient providing rationale, the  importance to continue to wear compression socks to help reduce her edema and reduce the risk for impaired skin integrity and or DVT Provided education to patient re: the importance of adherence to low Sodium diet; increasing water intake, shooting for 48-64 oz daily unless otherwise directed Discussed plans with patient for ongoing care management follow up and provided patient with direct contact information for care management team;  Diabetes Interventions:  (Status:  Condition stable.  Not addressed this visit.) Long Term Goal Assessed patient's understanding of A1c goal: <6.5% Provided education to patient about basic DM disease process Reviewed medications with patient and discussed importance of medication adherence Counseled on importance of regular laboratory monitoring as prescribed Provided patient with written educational materials related to hypo and hyperglycemia and importance of correct treatment Advised patient, providing education and rationale, to check cbg FBS before breakfast and at bedtime and record, calling PCP for findings outside established parameters Review of patient status, including review of consultants reports, relevant laboratory and other test results, and medications completed Educated patient on dietary and exercise recommendations; daily glycemic control FBS 80-130, <180 after meals;15'15' rule Mailed printed educational materials related to Diabetes Care Schedule; What is Diabetes?; Preventing Complications from Diabetes Lab Results  Component Value Date   HGBA1C 6.6 (H) 11/19/2021   Relapsing Remitting Multiple Sclerosis Interventions: Status: (Condition stable.  Not addressed this visit.) Long Term Goal Evaluation of current treatment plan related to  Multiple Sclerosis , self-management  and patient's adherence to plan as established by provider. Review of patient status, including review of consultant's reports, relevant laboratory and other test results,  and medications completed. Reviewed medications with patient and discussed importance of medication adherence Determined patient continues to work with in home PT twice weekly, she is finding this to be effective Educated patient on the importance to follow her HEP as directed, educated on recommendations for 150 minutes as tolerated Determined patient has increased her physical activity and is adhering to her HEP as directed  Reviewed scheduled/upcoming provider appointments including: next PCP follow up appointment scheduled for 03/06/22 $RemoveBef'@10'RPlFOVraBB$ :20 AM; Dr. Felecia Shelling Neuro follow up scheduled for 04/23/22 $RemoveBe'@11'EBuMfwmjI$ :00 AM Discussed plans with patient for ongoing care management follow up and provided patient with direct contact information for care management team  Hyperlipidemia Interventions:  (Status:  Condition stable.  Not addressed this visit.) Long Term Goal Medication review performed; medication list updated in electronic medical record.  Provider established cholesterol goals reviewed Counseled on importance of regular laboratory monitoring as prescribed Reviewed importance of limiting foods high in cholesterol Reviewed exercise goals and target of 150 minutes per week Lipid Panel     Component Value Date/Time   CHOL 165 11/19/2021 1133   TRIG 47 11/19/2021 1133   HDL 51 11/19/2021 1133   CHOLHDL 3.2 11/19/2021 1133   LDLCALC 104 (H) 11/19/2021 1133   LABVLDL 10 11/19/2021 1133     Grief Counseling Interventions:   (Status:  New goal.)  Short Term Goal Evaluation of current treatment plan related to  grief , self-management and patient's adherence to plan as established by provider Determined patient's mother passed away unexpectedly last week, she would like to reschedule this RN CM follow up call  Educated patient re: the availability of grief counseling provided by the [facility] and encouraged her to contact this agency if needed Determined patient cancelled her PCP follow up  visit but will reschedule when ready, sent in basket message to PCP provider regarding patient's circumstances Discussed plans with patient for ongoing care management follow up and provided patient with direct contact information for care management team   Patient Goals/Self-Care Activities: Take all medications as prescribed Attend all scheduled provider appointments Call pharmacy for medication refills 3-7 days in advance of running out of medications Call provider office for new concerns or questions  Contact [facility] for grief counseling if needed   Follow Up Plan:  Telephone follow up appointment with care management team member scheduled for:  05/09/22     Barb Merino, RN, BSN, CCM Care Management Coordinator [facility] [facility]  Direct Phone: [phone]

## 2022-03-14 DIAGNOSIS — E785 Hyperlipidemia, unspecified: Secondary | ICD-10-CM

## 2022-03-14 DIAGNOSIS — I169 Hypertensive crisis, unspecified: Secondary | ICD-10-CM | POA: Diagnosis not present

## 2022-03-14 DIAGNOSIS — E1165 Type 2 diabetes mellitus with hyperglycemia: Secondary | ICD-10-CM

## 2022-03-14 DIAGNOSIS — Z7984 Long term (current) use of oral hypoglycemic drugs: Secondary | ICD-10-CM | POA: Diagnosis not present

## 2022-03-19 ENCOUNTER — Telehealth: Payer: Self-pay

## 2022-03-19 NOTE — Chronic Care Management (AMB) (Signed)
[employer] patient assistance program notification:  Patient approved to receive Rybelsus 3 mg and a 120- day supply was filled 03/17/2022 and should arrive to the office in 10-14 business days. Patient enrollment will expire on 08/14/2022.  [name], [place] Pharmacist Assistant [phone]

## 2022-03-31 ENCOUNTER — Telehealth: Payer: Self-pay

## 2022-03-31 NOTE — Chronic Care Management (AMB) (Signed)
    Chronic Care Management Pharmacy [facility]   Name: [name]  MRN: [id number] DOB: April 22, 1966   Reason for Encounter: Disease State/ Diabetes  Recent office visits:  03-10-2022 Christina Logan, RN (CCM)  Recent consult visits:  None  Hospital visits:  None in previous 6 months  Medications: Outpatient Encounter Medications as of 03/31/2022  Medication Sig   APPLE CIDER VINEGAR PO Take 1 tablet by mouth daily. gummies   Blood Glucose Monitoring Suppl (FREESTYLE LITE) w/Device KIT Inject 1 each into the skin 2 (two) times daily. dxE11.65   Cholecalciferol (VITAMIN D3) 50 MCG (2000 UT) capsule TAKE 1 CAPSULE BY MOUTH EVERY DAY AS DIRECTED   [facility] B12 QUICK DISSOLVE 500 MCG LOZG TAKE 2 TABS BY MOUTH DAILY   Dimethyl Fumarate 240 MG CPDR Take 1 capsule (240 mg total) by mouth 2 (two) times daily.   furosemide (LASIX) 20 MG tablet Take 1 tab by mouth daily as needed for leg edema   glucose blood (FREESTYLE LITE) test strip Use as instructed to check blood sugars 2 times per day dx:e11.65   Microlet Lancets MISC Use as directed to check blood sugars 2 times per day dx: e11.65   OVER THE COUNTER MEDICATION [place] food 1 - 2 capsules daily   Semaglutide (RYBELSUS) 3 MG TABS Take 3 mg by mouth daily.   No facility-administered encounter medications on file as of 03/31/2022.  Recent Relevant Labs: Lab Results  Component Value Date/Time   HGBA1C 6.6 (H) 11/19/2021 11:18 AM   HGBA1C 6.2 (H) 09/11/2021 03:27 PM   MICROALBUR 10 09/11/2021 04:27 PM    Kidney Function Lab Results  Component Value Date/Time   CREATININE 0.81 11/19/2021 11:18 AM   CREATININE 0.74 09/11/2021 03:27 PM   GFRNONAA 84 02/20/2020 04:05 PM   GFRAA 97 02/20/2020 04:05 PM    Current antihyperglycemic regimen:  Rybelsus 3 mg daily (Patient stated she has been out of samples for a week)  What recent interventions/DTPs have been made to improve glycemic control:  Educated on A1c and blood  sugar goals; Complications of diabetes including kidney damage, retinal damage, and cardiovascular disease; -Tried to communicate with patient the importance of taking medication daily, and its benefits  Have there been any recent hospitalizations or ED visits since last visit with CPP? No  Patient denies hypoglycemic symptoms  Patient denies hyperglycemic symptoms  How often are you checking your blood sugar? once daily  What are your blood sugars ranging?  Fasting: 07-17 136, 07-16 191, 07-15 140, 07-14 132, 07-13 140, 07-12 136 , 07-11 141, 07-09 122, 07-08 245 Before meals: None After meals: None Bedtime: None  During the week, how often does your blood glucose drop below 70? Never  Are you checking your feet daily/regularly? Daily  Adherence Review: Is the patient currently on a STATIN medication? No Is the patient currently on ACE/ARB medication? No Does the patient have >5 day gap between last estimated fill dates? No  NOTES: Patient has been taking BP zone supplement since being out of rybelsus samples for a week. Patient doesn't wish to continue with rybelsus due to constipation and elevated sugar readings. Informed [name] and PCP.  Care Gaps: Shingrix overdue Covid booster overdue AWV 09-02-2022  Star Rating Drugs: Rybelsus 3 mg- Samples (PAP approved)  [name] [phone]

## 2022-04-04 ENCOUNTER — Telehealth: Payer: Self-pay

## 2022-04-04 NOTE — Chronic Care Management (AMB) (Signed)
    Chronic Care Management Pharmacy Assistant   Name: SAMARAH HOGLE  MRN: [id number] DOB: September 25, 1965  Reason for Encounter: Medication Review/ Medication coordination  Recent office visits:  None  Recent consult visits:  None  Hospital visits:  None in previous 6 months  Medications: Outpatient Encounter Medications as of 04/04/2022  Medication Sig   APPLE CIDER VINEGAR PO Take 1 tablet by mouth daily. gummies   Blood Glucose Monitoring Suppl (FREESTYLE LITE) w/Device KIT Inject 1 each into the skin 2 (two) times daily. dxE11.65   Cholecalciferol (VITAMIN D3) 50 MCG (2000 UT) capsule TAKE 1 CAPSULE BY MOUTH EVERY DAY AS DIRECTED   CVS B12 QUICK DISSOLVE 500 MCG LOZG TAKE 2 TABS BY MOUTH DAILY   Dimethyl Fumarate 240 MG CPDR Take 1 capsule (240 mg total) by mouth 2 (two) times daily.   furosemide (LASIX) 20 MG tablet Take 1 tab by mouth daily as needed for leg edema   glucose blood (FREESTYLE LITE) test strip Use as instructed to check blood sugars 2 times per day dx:e11.65   Microlet Lancets MISC Use as directed to check blood sugars 2 times per day dx: e11.65   OVER THE COUNTER MEDICATION [place] food 1 - 2 capsules daily   Semaglutide (RYBELSUS) 3 MG TABS Take 3 mg by mouth daily.   No facility-administered encounter medications on file as of 04/04/2022.  Reviewed chart for medication changes ahead of medication coordination call.  No OVs, Consults, or hospital visits since last care coordination call/Pharmacist visit.   No medication changes indicated   BP Readings from Last 3 Encounters:  02/12/22 (!) 162/93  11/19/21 124/76  09/11/21 130/82    Lab Results  Component Value Date   HGBA1C 6.6 (H) 11/19/2021     Patient obtains medications through Vials  30 Days   Last adherence delivery included: Patient denied last months delivery  Patient declined (meds) last month: Microlet lancets- [facility] supply Embrace talk test strips- [facility]  supply  Patient is due for next adherence delivery on: 04-16-2022  Called patient and reviewed medications   This delivery to include: None  No acute/short fill needed  Patient declined the following medications: Microlet lancets- [facility] supply Embrace talk test strips- [facility] supply  Patient needs refills for: None  Patient declined 04-16-2022. Patient will call a week prior to needing delivery.   Care Gaps: Shingrix overdue PAP smear overdue Covid booster overdue  Star Rating Drugs: Rybelsus 3 mg- Patient assistance (Patient reported not taking)  Jeannette How [place] Pharmacist Assistant [phone]

## 2022-04-08 ENCOUNTER — Telehealth: Payer: BC Managed Care – PPO

## 2022-04-09 ENCOUNTER — Telehealth: Payer: Self-pay

## 2022-04-09 NOTE — Chronic Care Management (AMB) (Signed)
Care Gap(s) Not Met that Need to be Addressed:  Breast Cancer Screening  Action Taken: Reviewed chart. Patient's last mammogram 08-22-2014. Patient stated mammogram scheduled for 05-11-2022   Follow Up: None needed  Care Gap(s) Not Met that Need to be Addressed:  Eye Exam for Patients With Diabetes   Action Taken: Reviewed chart. Unable to clarify last eye exam. Patient stated next appointment 05-01-2022   Follow Up: None needed  Tees T[place] Pharmacist Assistant [phone]

## 2022-04-10 DIAGNOSIS — Z01411 Encounter for gynecological examination (general) (routine) with abnormal findings: Secondary | ICD-10-CM | POA: Diagnosis not present

## 2022-04-10 DIAGNOSIS — Z30432 Encounter for removal of intrauterine contraceptive device: Secondary | ICD-10-CM | POA: Diagnosis not present

## 2022-04-10 DIAGNOSIS — Z124 Encounter for screening for malignant neoplasm of cervix: Secondary | ICD-10-CM | POA: Diagnosis not present

## 2022-04-10 DIAGNOSIS — T8389XD Other specified complication of genitourinary prosthetic devices, implants and grafts, subsequent encounter: Secondary | ICD-10-CM | POA: Diagnosis not present

## 2022-04-10 DIAGNOSIS — Z1231 Encounter for screening mammogram for malignant neoplasm of breast: Secondary | ICD-10-CM | POA: Diagnosis not present

## 2022-04-18 DIAGNOSIS — G35 Multiple sclerosis: Secondary | ICD-10-CM | POA: Diagnosis not present

## 2022-04-23 ENCOUNTER — Ambulatory Visit: Payer: BC Managed Care – PPO | Admitting: Neurology

## 2022-05-05 DIAGNOSIS — G35 Multiple sclerosis: Secondary | ICD-10-CM | POA: Diagnosis not present

## 2022-05-05 DIAGNOSIS — I890 Lymphedema, not elsewhere classified: Secondary | ICD-10-CM | POA: Diagnosis not present

## 2022-05-05 DIAGNOSIS — I10 Essential (primary) hypertension: Secondary | ICD-10-CM | POA: Diagnosis not present

## 2022-05-05 DIAGNOSIS — D849 Immunodeficiency, unspecified: Secondary | ICD-10-CM | POA: Diagnosis not present

## 2022-05-07 NOTE — Progress Notes (Signed)
This encounter was created in error - please disregard.

## 2022-05-09 ENCOUNTER — Ambulatory Visit: Payer: Self-pay

## 2022-05-09 NOTE — Patient Outreach (Signed)
  Care Coordination   Follow Up Visit Note   05/09/2022 Name: [name] MRN: [id number] DOB: [birth year]  [name] is a 56 y.o. year old female who sees [name], [place] for primary care. I spoke with  [name] by phone today.  What matters to the patients health and wellness today?  Patient would like to continue to work on lowering her A1c.     Goals Addressed     Patient Stated     COMPLETED: I want to keep working to get my A1c down (pt-stated)        Care Coordination Interventions: Provided education to patient about basic DM disease process Reviewed medications with patient and discussed importance of medication adherence Advised patient, providing education and rationale, to check cbg daily before meals and record, calling PCP for findings outside established parameters Review of patient status, including review of consultants reports, relevant laboratory and other test results, and medications completed Educated patient regarding daily glycemic control, FBS 80-130, <180 within 2 hours after meals  Mailed printed educational materials to patient related to Diabetes Hypo/Hyperglycemic management  Determined patient has transitioned over to [place] for primary care Sent in basket message to PCP provider [name] FNP with notification of transfer to [place]     SDOH assessments and interventions completed:  No     Care Coordination Interventions Activated:  Yes  Care Coordination Interventions:  Yes, provided   Follow up plan: No further intervention required.   Encounter Outcome:  Pt. Visit Completed

## 2022-05-09 NOTE — Patient Instructions (Signed)
Visit Information  Thank you for taking time to visit with me today. Please don't hesitate to contact me if I can be of assistance to you.   Following are the goals we discussed today:   Goals Addressed     Patient Stated     COMPLETED: I want to keep working to get my A1c down (pt-stated)        Care Coordination Interventions: Provided education to patient about basic DM disease process Reviewed medications with patient and discussed importance of medication adherence Advised patient, providing education and rationale, to check cbg daily before meals and record, calling PCP for findings outside established parameters Review of patient status, including review of consultants reports, relevant laboratory and other test results, and medications completed Educated patient regarding daily glycemic control, FBS 80-130, <180 within 2 hours after meals  Mailed printed educational materials to patient related to Diabetes Hypo/Hyperglycemic management  Determined patient has transitioned over to [place] for primary care Sent in basket message to PCP provider [name] FNP with notification of transfer to [place]     If you are experiencing a Mental Health or [place] or need someone to talk to, please call 1-800-273-TALK (toll free, 24 hour hotline)  Patient verbalizes understanding of instructions and care plan provided today and agrees to view in [place]. Active MyChart status and patient understanding of how to access instructions and care plan via MyChart confirmed with patient.     Barb Merino, RN, BSN, CCM Care Management Coordinator [facility] Management Direct Phone: [phone]

## 2022-05-13 ENCOUNTER — Telehealth: Payer: BC Managed Care – PPO

## 2022-05-16 ENCOUNTER — Telehealth: Payer: Self-pay

## 2022-05-16 DIAGNOSIS — E1165 Type 2 diabetes mellitus with hyperglycemia: Secondary | ICD-10-CM | POA: Diagnosis not present

## 2022-05-16 DIAGNOSIS — D849 Immunodeficiency, unspecified: Secondary | ICD-10-CM | POA: Diagnosis not present

## 2022-05-16 DIAGNOSIS — I10 Essential (primary) hypertension: Secondary | ICD-10-CM | POA: Diagnosis not present

## 2022-05-16 DIAGNOSIS — G35 Multiple sclerosis: Secondary | ICD-10-CM | POA: Diagnosis not present

## 2022-05-16 DIAGNOSIS — I890 Lymphedema, not elsewhere classified: Secondary | ICD-10-CM | POA: Diagnosis not present

## 2022-05-16 DIAGNOSIS — E559 Vitamin D deficiency, unspecified: Secondary | ICD-10-CM | POA: Diagnosis not present

## 2022-05-16 NOTE — Chronic Care Management (AMB) (Unsigned)
Prior authorization request recevied from [facility] for Ozempic, patient insurance needs additional clinical information to process prior authozation, plan only covers drug if A1C is greater than or equal to 6.5%. Faxed recent labs to [facility] prior authorization department. Awaiting determination.  Billee Cashing, CMA Clinical Pharmacist Assistant [phone]

## 2022-05-21 ENCOUNTER — Telehealth: Payer: Self-pay

## 2022-05-21 NOTE — Telephone Encounter (Signed)
Spoke with patient who confirmed they are being seen by Sharyn Lull, NP through [facility]. Spoke with [employer] and let them know that patient was approved for patient assistance and that we received a prior authorization request. Information received by a Freight forwarder.   [name], CPP, PharmD Clinical Pharmacist Practitioner [facility] [phone]

## 2022-05-21 NOTE — Chronic Care Management (AMB) (Signed)
05-21-2022: Contacted [employer] to send a return label for rybelsus and to unenroll patient due to declining use of medication.  [place] Pharmacist Assistant [phone]

## 2022-05-23 NOTE — Telephone Encounter (Signed)
Duplicate note opened in error  

## 2022-05-29 ENCOUNTER — Other Ambulatory Visit: Payer: Self-pay | Admitting: Neurology

## 2022-05-29 ENCOUNTER — Other Ambulatory Visit: Payer: Self-pay

## 2022-06-04 DIAGNOSIS — E1165 Type 2 diabetes mellitus with hyperglycemia: Secondary | ICD-10-CM | POA: Diagnosis not present

## 2022-06-04 DIAGNOSIS — E538 Deficiency of other specified B group vitamins: Secondary | ICD-10-CM | POA: Diagnosis not present

## 2022-06-09 ENCOUNTER — Other Ambulatory Visit: Payer: Self-pay | Admitting: Neurology

## 2022-06-12 ENCOUNTER — Other Ambulatory Visit: Payer: Self-pay | Admitting: *Deleted

## 2022-06-12 ENCOUNTER — Telehealth: Payer: Self-pay | Admitting: Neurology

## 2022-06-12 MED ORDER — CVS B12 QUICK DISSOLVE 500 MCG PO LOZG: 1000.0000 ug | LOZENGE | Freq: Every day | ORAL | 1 refills | Status: AC

## 2022-06-12 NOTE — Telephone Encounter (Signed)
Pt is calling. Requesting a refill on [facility] B12 QUICK DISSOLVE 500 MCG LOZG. [id number]

## 2022-06-12 NOTE — Telephone Encounter (Signed)
Spoke w/ Dr. Felecia Shelling. He refilled last for pt in 2021. He was ok with refilling. I e-scribed rx to pharmacy that pt requested.

## 2022-07-11 DIAGNOSIS — E1165 Type 2 diabetes mellitus with hyperglycemia: Secondary | ICD-10-CM | POA: Diagnosis not present

## 2022-08-06 ENCOUNTER — Telehealth: Payer: Self-pay | Admitting: Neurology

## 2022-08-06 NOTE — Telephone Encounter (Signed)
..   Pt understands that although there may be some limitations with this type of visit, we will take all precautions to reduce any security or privacy concerns.  Pt understands that this will be treated like an in office visit and we will file with pt's insurance, and there may be a patient responsible charge related to this service. ? ?

## 2022-08-12 DIAGNOSIS — G35 Multiple sclerosis: Secondary | ICD-10-CM | POA: Diagnosis not present

## 2022-08-12 DIAGNOSIS — I10 Essential (primary) hypertension: Secondary | ICD-10-CM | POA: Diagnosis not present

## 2022-08-12 DIAGNOSIS — E1165 Type 2 diabetes mellitus with hyperglycemia: Secondary | ICD-10-CM | POA: Diagnosis not present

## 2022-08-12 DIAGNOSIS — Z7984 Long term (current) use of oral hypoglycemic drugs: Secondary | ICD-10-CM | POA: Diagnosis not present

## 2022-08-28 NOTE — Progress Notes (Signed)
PATIENT: [name] DOB: Apr 02, 1966  REASON FOR VISIT: follow up HISTORY FROM: patient  Virtual Visit via Telephone Note  I connected with [name] on 09/02/22 at  2:30 PM EST by telephone and verified that I am speaking with the correct person using two identifiers.   I discussed the limitations, risks, security and privacy concerns of performing an evaluation and management service by telephone and the availability of in person appointments. I also discussed with the patient that there may be a patient responsible charge related to this service. The patient expressed understanding and agreed to proceed.   History of Present Illness:  09/02/22 ALL: [name] is a 56 y.o. female here today for follow up for RRMS. She continues dimethyl fumerate. Labs have been stable. Imaging stable 06/2021.   She feels that she is fairly stable. No new or exacerbating symptoms. She continues to have lower extremity weakness. She has been working with PT through a friend who is in school. She denies any recent falls. She continues to use her walker.   She continues vitamin d and B12 supplements. She does not drive and has limited transportation options. Her husband helps with appts if on Fridays and her daughter tries to help when she can.   History (copied from Dr Clydene Fake previous note)  [name] is a 56 y.o. woman with relapsing remitting MS diagnosed in 1997.     Update 02/12/2022: She is on Tecfidera and tolerates it well.   She feels stable on Tecfidera 240 mg bid     Labs 05/01/2021 showed 1.4 lymphocyte count.    She feels her MS has been stable.  She has no exacerbations or new symptoms.   Gait is the same.   She uses a walker and can go 100 feet, sometimes more, about the same as earlier this year.  She has bilateral leg weakness.  She has some mild spasticity. She denies any weakness in her hands/arms but has som muscle aches.   She has ankle edema and is  wearing  compression stockings.     She can transfer to shower chair independently.    Bladder is doing well.    Vision is fine.     Fatigue is doing better last fe wmonths than last year.  She sleeps well most well.  She takes occasional naps.    She is eating well.   She takes vitamin D supplements.  She denies depression or anxiety.  Cognition is doing well.     She was diagnosed with NIDDM and was on metformin but stopped as she prefers to lose weight and eat better.        MS History:    In 1997, she presented with poor gait and visual blurring. She had an MRI and a lumbar puncture. The studies were consistent with multiple sclerosis.  She turned down a disease modifying therapy at that time. Her legs and her walking got completely better and she was back at baseline after the steroids. Over the next 15 years or so she had no additional symptoms. She had worsening symptoms in the left leg that was affecting her balance.  . She opted not to go on medication initially.  She had some progression and started Tecfidera around 2014    Images: MRI Brain 06/17/2021 T2/FLAIR hyperintense foci in the brainstem and hemispheres in a pattern consistent with chronic demyelinating plaque associated with multiple sclerosis.  None of the foci appear to  be acute.  Compared to the MRI dated 06/17/2021, there are no new lesions.   Mild cortical and corpus callosum atrophy.   MRI cervical spine 06/17/2021 Multiple T2 hyperintense foci within the spinal cord in a pattern consistent with chronic demyelinating plaque associated with multiple sclerosis.  Compared to the MRI from 06/06/2015, allowing for differences in technique, there do not appear to be any new lesions.       At C3-C4, there are degenerative changes causing borderline spinal stenosis and moderately severe right foraminal narrowing with potential for right C4 nerve root compression.   At C5-C6, there are degenerative changes causing mild spinal stenosis and moderate  right foraminal narrowing but no nerve root compression.   Milder degenerative changes at C4-C5, and C6-C7 that do not cause spinal stenosis or nerve root compression.   MRI Brain 06/22/2017 shows T2/FLAIR hyperintense foci in the cerebral hemispheres and brainstem in a pattern and configuration consistent with chronic demyelinating plaque associated with multiple sclerosis. None of the foci appears to be acute. When compared to the MRI dated 06/06/2015, there is no interval change.     There is a normal enhancement pattern and there are no acute findings.   MRI cervical spine 06/22/2017 showed several lesions within the spinal cord adjacent to C2, C2-C3, C4 and C7 as described above. None of these enhance after contrast was administered. When compared to the MRI dated 06/06/2015, there has been no definite interval change.     Multilevel degenerative changes as detailed above. There is mild spinal stenosis at C3-C4.  There is moderate right foraminal narrowing at C3-C4 and at C5-C6. The degenerative changes encroach upon the right C4 and C6 nerve roots though there is no definite nerve root compression.    Compared to the MRI dated 06/06/2015, there is no definite interval change.    There is a normal enhancement pattern.   MRI thoracic spine 06/22/2017 showed hyperintense foci within the spinal cord adjacent to C7, T3, T4, T4-T5, T7, T8-T9, T10 and T11 as described above. None of these appear to enhance after contrast was administered. These are consistent with chronic demyelinating plaque associated with multiple sclerosis.    Small disc protrusion at T7-T8 that does not lead to any nerve root compression.   Observations/Objective:  Generalized: Well developed, in no acute distress  Mentation: Alert oriented to time, place, history taking. Follows all commands speech and language fluent   Assessment and Plan:  56 y.o. year old female  has a past medical history of Movement disorder and Multiple  sclerosis ([place]). here with    ICD-10-CM   1. Multiple sclerosis ([place])  G35     2. High risk medication use  Z79.899     3. Gait abnormality  R26.9     4. Other fatigue  R53.83     5. Urinary frequency  R35.0     6. Left foot drop  M21.372       Esta reports MS is stable. We will update labs. She will come by the office for labs in the next two weeks. She will continue regular physical activity. Follow up with Dr Felecia Shelling in 6 months.   No orders of the defined types were placed in this encounter.   No orders of the defined types were placed in this encounter.    Follow Up Instructions:  I discussed the assessment and treatment plan with the patient. The patient was provided an opportunity to ask questions and all were  answered. The patient agreed with the plan and demonstrated an understanding of the instructions.   The patient was advised to call back or seek an in-person evaluation if the symptoms worsen or if the condition fails to improve as anticipated.  I provided 20 minutes of non-face-to-face time during this encounter. Patient located at their place of residence during [place] visit. Provider is in the office.    Debbora Presto, NP

## 2022-08-29 ENCOUNTER — Other Ambulatory Visit: Payer: Self-pay | Admitting: Neurology

## 2022-09-02 ENCOUNTER — Telehealth (INDEPENDENT_AMBULATORY_CARE_PROVIDER_SITE_OTHER): Payer: BC Managed Care – PPO | Admitting: Family Medicine

## 2022-09-02 ENCOUNTER — Encounter: Payer: Self-pay | Admitting: Family Medicine

## 2022-09-02 ENCOUNTER — Ambulatory Visit: Payer: BC Managed Care – PPO | Admitting: Nurse Practitioner

## 2022-09-02 DIAGNOSIS — R350 Frequency of micturition: Secondary | ICD-10-CM | POA: Diagnosis not present

## 2022-09-02 DIAGNOSIS — R269 Unspecified abnormalities of gait and mobility: Secondary | ICD-10-CM | POA: Diagnosis not present

## 2022-09-02 DIAGNOSIS — M21372 Foot drop, left foot: Secondary | ICD-10-CM

## 2022-09-02 DIAGNOSIS — G35 Multiple sclerosis: Secondary | ICD-10-CM | POA: Diagnosis not present

## 2022-09-02 DIAGNOSIS — Z79899 Other long term (current) drug therapy: Secondary | ICD-10-CM | POA: Diagnosis not present

## 2022-09-02 DIAGNOSIS — R5383 Other fatigue: Secondary | ICD-10-CM

## 2022-10-07 DIAGNOSIS — Z7984 Long term (current) use of oral hypoglycemic drugs: Secondary | ICD-10-CM | POA: Diagnosis not present

## 2022-10-07 DIAGNOSIS — G35 Multiple sclerosis: Secondary | ICD-10-CM | POA: Diagnosis not present

## 2022-10-09 ENCOUNTER — Telehealth: Payer: Self-pay | Admitting: Family Medicine

## 2022-10-09 NOTE — Telephone Encounter (Signed)
Please call pt and let her know lab orders still good and she can come in to complete as planned.

## 2022-10-09 NOTE — Telephone Encounter (Signed)
Pt would not be able to come for labs until 10/23/22 due to transportation issues. Pt asking will order for labs still good. Would like a call back.

## 2022-10-09 NOTE — Telephone Encounter (Signed)
Called pt back. Informed her of message nurse [facility] sent. Pt said thank you for returning my call.

## 2022-10-09 NOTE — Telephone Encounter (Signed)
Noted  

## 2022-10-13 DIAGNOSIS — I10 Essential (primary) hypertension: Secondary | ICD-10-CM | POA: Diagnosis not present

## 2022-10-16 ENCOUNTER — Encounter: Payer: Self-pay | Admitting: Internal Medicine

## 2022-10-16 LAB — HM DIABETES EYE EXAM

## 2022-10-23 DIAGNOSIS — N950 Postmenopausal bleeding: Secondary | ICD-10-CM | POA: Diagnosis not present

## 2022-10-23 DIAGNOSIS — G35 Multiple sclerosis: Secondary | ICD-10-CM | POA: Diagnosis not present

## 2022-10-28 ENCOUNTER — Other Ambulatory Visit: Payer: Self-pay | Admitting: Obstetrics & Gynecology

## 2022-10-28 DIAGNOSIS — N950 Postmenopausal bleeding: Secondary | ICD-10-CM

## 2022-11-03 ENCOUNTER — Other Ambulatory Visit (HOSPITAL_COMMUNITY): Payer: Self-pay | Admitting: Obstetrics & Gynecology

## 2022-11-03 DIAGNOSIS — N939 Abnormal uterine and vaginal bleeding, unspecified: Secondary | ICD-10-CM | POA: Diagnosis not present

## 2022-11-03 DIAGNOSIS — R54 Age-related physical debility: Secondary | ICD-10-CM | POA: Diagnosis not present

## 2022-11-03 DIAGNOSIS — G35 Multiple sclerosis: Secondary | ICD-10-CM | POA: Diagnosis not present

## 2022-11-03 DIAGNOSIS — N950 Postmenopausal bleeding: Secondary | ICD-10-CM

## 2022-11-03 DIAGNOSIS — R29898 Other symptoms and signs involving the musculoskeletal system: Secondary | ICD-10-CM | POA: Diagnosis not present

## 2022-11-03 DIAGNOSIS — I10 Essential (primary) hypertension: Secondary | ICD-10-CM | POA: Diagnosis not present

## 2022-11-03 DIAGNOSIS — Z7984 Long term (current) use of oral hypoglycemic drugs: Secondary | ICD-10-CM | POA: Diagnosis not present

## 2022-11-03 DIAGNOSIS — E1165 Type 2 diabetes mellitus with hyperglycemia: Secondary | ICD-10-CM | POA: Diagnosis not present

## 2022-11-04 DIAGNOSIS — I10 Essential (primary) hypertension: Secondary | ICD-10-CM | POA: Diagnosis not present

## 2022-11-04 DIAGNOSIS — E1165 Type 2 diabetes mellitus with hyperglycemia: Secondary | ICD-10-CM | POA: Diagnosis not present

## 2022-11-04 DIAGNOSIS — R54 Age-related physical debility: Secondary | ICD-10-CM | POA: Diagnosis not present

## 2022-11-05 ENCOUNTER — Telehealth: Payer: Self-pay | Admitting: *Deleted

## 2022-11-05 NOTE — Telephone Encounter (Signed)
Submitted PA Dimethyl fumarate on covermymeds. Key: [id number]. Received instant approval: "[id number];Review Type:Prior Auth;Coverage Start Date:10/06/2022;Coverage End Date:11/05/2023;"

## 2022-11-14 ENCOUNTER — Ambulatory Visit (HOSPITAL_COMMUNITY)
Admission: RE | Admit: 2022-11-14 | Discharge: 2022-11-14 | Disposition: A | Discharge status: Home / Self Care | Payer: BC Managed Care – PPO | Source: Ambulatory Visit | Attending: Obstetrics & Gynecology | Admitting: Obstetrics & Gynecology

## 2022-11-14 DIAGNOSIS — N950 Postmenopausal bleeding: Secondary | ICD-10-CM | POA: Insufficient documentation

## 2022-11-14 DIAGNOSIS — D259 Leiomyoma of uterus, unspecified: Secondary | ICD-10-CM | POA: Diagnosis not present

## 2022-12-01 ENCOUNTER — Telehealth: Payer: Self-pay | Admitting: Neurology

## 2022-12-01 MED ORDER — VITAMIN D3 50 MCG (2000 UT) PO TABS: 1.0000 | ORAL_TABLET | Freq: Every day | ORAL | 0 refills | Status: AC

## 2022-12-01 NOTE — Telephone Encounter (Signed)
Pt called stated she wants to know if Dr. Felecia Shelling will prescript more  Cholecalciferol (VITAMIN D3) 50 MCG (2000 UT) TABS. Refill [id number]

## 2022-12-01 NOTE — Telephone Encounter (Signed)
Called pt and informed her. Pt verbalized appreciation.

## 2022-12-01 NOTE — Telephone Encounter (Signed)
E-scribed refill. Please call pt to let her know

## 2022-12-03 DIAGNOSIS — D250 Submucous leiomyoma of uterus: Secondary | ICD-10-CM | POA: Diagnosis not present

## 2022-12-03 DIAGNOSIS — D251 Intramural leiomyoma of uterus: Secondary | ICD-10-CM | POA: Diagnosis not present

## 2022-12-03 DIAGNOSIS — N950 Postmenopausal bleeding: Secondary | ICD-10-CM | POA: Diagnosis not present

## 2022-12-05 DIAGNOSIS — R54 Age-related physical debility: Secondary | ICD-10-CM | POA: Diagnosis not present

## 2022-12-05 DIAGNOSIS — G35 Multiple sclerosis: Secondary | ICD-10-CM | POA: Diagnosis not present

## 2022-12-05 DIAGNOSIS — I890 Lymphedema, not elsewhere classified: Secondary | ICD-10-CM | POA: Diagnosis not present

## 2022-12-05 DIAGNOSIS — E1165 Type 2 diabetes mellitus with hyperglycemia: Secondary | ICD-10-CM | POA: Diagnosis not present

## 2022-12-05 DIAGNOSIS — D8481 Immunodeficiency due to conditions classified elsewhere: Secondary | ICD-10-CM | POA: Diagnosis not present

## 2022-12-18 ENCOUNTER — Telehealth: Payer: Self-pay | Admitting: Neurology

## 2022-12-18 NOTE — Telephone Encounter (Signed)
Left renewal handicap form in Dr. Felecia Shelling sign off box. Pt requesting form be mailed to address.  [place] [phone]

## 2022-12-18 NOTE — Telephone Encounter (Signed)
Form Signed and going to be mailed on 12/18/2022

## 2022-12-30 DIAGNOSIS — G35 Multiple sclerosis: Secondary | ICD-10-CM | POA: Diagnosis not present

## 2023-01-01 ENCOUNTER — Telehealth: Payer: Self-pay | Admitting: *Deleted

## 2023-01-01 NOTE — Telephone Encounter (Signed)
   Given to [name] in medical records. [name] said she will email to patient.

## 2023-01-12 ENCOUNTER — Other Ambulatory Visit: Payer: Self-pay | Admitting: Neurology

## 2023-01-13 ENCOUNTER — Other Ambulatory Visit: Payer: Self-pay

## 2023-01-26 DIAGNOSIS — I10 Essential (primary) hypertension: Secondary | ICD-10-CM | POA: Diagnosis not present

## 2023-01-29 ENCOUNTER — Encounter (HOSPITAL_BASED_OUTPATIENT_CLINIC_OR_DEPARTMENT_OTHER): Payer: Self-pay | Admitting: Obstetrics & Gynecology

## 2023-01-29 ENCOUNTER — Other Ambulatory Visit: Payer: Self-pay

## 2023-01-29 NOTE — Progress Notes (Signed)
Spoke w/ via phone for pre-op interview---pt Lab needs dos---- I stat and EKG            Lab results------ COVID test -----patient states asymptomatic no test needed Arrive at -------0945 NPO after MN NO Solid Food.  Clear liquids from MN until---0845 Med rec completed Medications to take morning of surgery -----Dimethyl Fumarater Diabetic medication -----n/a Patient instructed no nail polish to be worn day of surgery Patient instructed to bring photo id and insurance card day of surgery Patient aware to have Driver (ride ) / caregiver husband [name]    for 24 hours after surgery  Patient Special Instructions ----- Pre-Op special Instructions ----- Patient verbalized understanding of instructions that were given at this phone interview. Patient denies shortness of breath, chest pain, fever, cough at this phone interview.   Pt has MS with lower extremities weakness, pt requires a walker to walk.    Pt unable to climb a flight of stairs due to needs to use walker to leg weakness.

## 2023-02-02 DIAGNOSIS — D8481 Immunodeficiency due to conditions classified elsewhere: Secondary | ICD-10-CM | POA: Diagnosis not present

## 2023-02-02 DIAGNOSIS — L0292 Furuncle, unspecified: Secondary | ICD-10-CM | POA: Diagnosis not present

## 2023-02-02 DIAGNOSIS — E1165 Type 2 diabetes mellitus with hyperglycemia: Secondary | ICD-10-CM | POA: Diagnosis not present

## 2023-02-02 DIAGNOSIS — G35 Multiple sclerosis: Secondary | ICD-10-CM | POA: Diagnosis not present

## 2023-02-02 DIAGNOSIS — Z1331 Encounter for screening for depression: Secondary | ICD-10-CM | POA: Diagnosis not present

## 2023-02-13 ENCOUNTER — Encounter (HOSPITAL_COMMUNITY): Payer: Self-pay | Admitting: Obstetrics & Gynecology

## 2023-02-13 ENCOUNTER — Other Ambulatory Visit: Payer: Self-pay

## 2023-02-13 DIAGNOSIS — N950 Postmenopausal bleeding: Secondary | ICD-10-CM | POA: Diagnosis present

## 2023-02-13 NOTE — Progress Notes (Signed)
Spoke with patient for pre-op call. Pt denies cardiac history or HTN. Pt is diabetic. States her blood sugar has been running high since she has been on Keflex. States it's usually around 160, but it has gotten as high as 224 this week. Pt is not on any diabetic medications. She does not know what her A1C or when it was last taken. She states that the home health agency that visits her will be checking it at their next visit.   Pt uses a walker due to her MS. She states she will use a wheelchair while here.

## 2023-02-15 NOTE — H&P (Incomplete)
[name] is an 57 y.o. female para 2 with postmenopausal bleeding and fibroids here for a dilation curettage Hysteroscopy with myosure.    Pertinent Gynecological History: Menses: post-menopausal Bleeding: Not present currently.   Contraception: post menopausal status DES exposure: unknown Blood transfusions: none Sexually transmitted diseases: no past history Previous GYN Procedures:  D & C hysteroscopy   Last mammogram: normal Date: 04/15/22 Last pap: normal Date: 03/2022 OB History: G3, P2012   Menstrual History: No LMP recorded (lmp unknown).    Past Medical History:  Diagnosis Date   COVID 09/15/2020   mild   Diabetes mellitus without complication (HCC)    Movement disorder    pt walks with walker, bilateral low extremities weakness due to MS. 01/28/2023   Multiple sclerosis (HCC)     Past Surgical History:  Procedure Laterality Date   CESAREAN SECTION     UTERINE FIBROID SURGERY     WISDOM TOOTH EXTRACTION      Family History  Problem Relation Age of Onset   Hypertension Mother    Hypertension Father    Diabetes Father     Social History:  reports that she has never smoked. She has never been exposed to tobacco smoke. She has never used smokeless tobacco. She reports that she does not drink alcohol and does not use drugs.  Allergies:  Allergies  Allergen Reactions   Food Other (See Comments)    WHAT FOOD CAUSES THIS REACTION?  Soy Milk Eyes swelling and throat swelling   Milk (Cow) Other (See Comments)   Oxycodone Hcl Hives   Soy Allergy Swelling    Other reaction(s): SWELLING  and throat swelling .   milk    Current Outpatient Medications  Medication Instructions   B-12 Microlozenge 500 mcg, Oral, 2 times daily   Barberry-Oreg Grape-Goldenseal (BERBERINE COMPLEX PO) 1 tablet, Oral, Daily   Blood Glucose Monitoring Suppl (FREESTYLE LITE) w/Device KIT 1 each, Intradermal, 2 times daily, dxE11.65   cephALEXin (KEFLEX) 500 mg, Oral, 3 times daily    [facility] B12 Quick Dissolve 1,000 mcg, Oral, Daily   Dimethyl Fumarate 240 mg, Oral, 2 times daily   furosemide (LASIX) 20 MG tablet Take 1 tab by mouth daily as needed for leg edema   glucose blood (FREESTYLE LITE) test strip Use as instructed to check blood sugars 2 times per day dx:e11.65   Microlet Lancets MISC Use as directed to check blood sugars 2 times per day dx: e11.65   OVER THE COUNTER MEDICATION 1-2 capsules, Oral, Daily, Sea Moss Advanced Super food<BR>   Probiotic Product (PROBIOTIC PO) 1 tablet, Oral, Daily, Good Ezmes   Rybelsus 3 mg, Oral, Daily   Vitamin D3 2,000 Units, Oral, Daily   . Allergies  Allergen Reactions   Food Other (See Comments)    WHAT FOOD CAUSES THIS REACTION?  Soy Milk Eyes swelling and throat swelling   Milk (Cow) Other (See Comments)   Oxycodone Hcl Hives   Soy Allergy Swelling    Other reaction(s): SWELLING  and throat swelling .   milk     Review of Systems Constitutional: Denies fevers/chills Cardiovascular: Denies chest pain or palpitations Pulmonary: Denies coughing or wheezing Gastrointestinal: Denies nausea, vomiting or diarrhea Genitourinary: Denies pelvic pain, unusual vaginal bleeding, unusual vaginal discharge, dysuria, urgency or frequency.  Musculoskeletal: Denies muscle or joint aches and pain.  Neurology: Denies abnormal sensations such as tingling or numbness.    Height 5\' 7"  (1.702 m), weight 127 kg. Blood pressure 137/69,  pulse 100, temperature 99 F (37.2 C), temperature source Oral, resp. rate 18, height 5\' 7"  (1.702 m), weight 127 kg, SpO2 100 %.  Physical Exam Constitutional: She is oriented to person, place, and time. She appears morbidly obese, sitted on a wheel chair.  HENT:  Head: Normocephalic and atraumatic.  Neck: Normal range of motion.  Cardiovascular: Normal rate, regular rhythm and normal heart sounds.   Respiratory: Effort normal and breath sounds normal.  GI: Soft. Bowel sounds are normal.   Neurological: She is alert and oriented to person, place, and time.  Skin: Skin is warm and dry.  Psychiatric: She has a normal mood and affect. Her behavior is normal.   Extremities: Warm and well perfused, with 3+ edema bilaterally.   Pelvic Ultrasound: 10/23/22:Uterus is 10 x 6.7 x 6.1 cm.  With fundal fibroid measuring 2.7 x 2.4 x 2.4 cm.  Hypodensity within endometrium measures 1.6 x 1.6 x 1.8 cm, possible submucosal fibroid. EMS 10mm. Left and right ovary not seen.    FSH 10/23/22: 31.2 in postmenopausal range.     Recent Results (from the past 2160 hour(s))  CBC     Status: Abnormal   Collection Time: 02/16/23 12:11 PM  Result Value Ref Range   WBC 8.9 4.0 - 10.5 K/uL   RBC 3.99 3.87 - 5.11 MIL/uL   Hemoglobin 10.9 (L) 12.0 - 15.0 g/dL   HCT 40.9 (L) 81.1 - 91.4 %   MCV 85.5 80.0 - 100.0 fL   MCH 27.3 26.0 - 34.0 pg   MCHC 32.0 30.0 - 36.0 g/dL   RDW 78.2 95.6 - 21.3 %   Platelets 305 150 - 400 K/uL   nRBC 0.0 0.0 - 0.2 %    Comment: Performed at [facility], [address] [place], [place] [address]  Glucose, capillary     Status: Abnormal   Collection Time: 02/16/23 12:37 PM  Result Value Ref Range   Glucose-Capillary 157 (H) 70 - 99 mg/dL    Comment: Glucose reference range applies only to samples taken after fasting for at least 8 hours.  Type and screen [facility]     Status: None (Preliminary result)   Collection Time: 02/16/23 12:55 PM  Result Value Ref Range   ABO/RH(D) PENDING    Antibody Screen PENDING    Sample Expiration      02/19/2023,2359 Performed at [facility], [address] [place], [place] [address]   ABO/Rh     Status: None (Preliminary result)   Collection Time: 02/16/23  1:00 PM  Result Value Ref Range   ABO/RH(D) PENDING      Urine pregnancy test 02/16/23: Negative.   Assessment/Plan: 57 y/o Para 2 with postmenopausal bleeding and intramural and submucosal fibroids, here for Dilation and Curettage hysteroscopy  with myosure, with goal of removing intracavitary lesions and endometrial sampling, - This procedure has been fully reviewed with the patient and written informed consent has been obtained. We discussed risks including but not limited to risks of bleeding, infection, damage to organs and vessels that may necessitate additional procedures.   - Admit to [facility] OR- outpatient.  - NPO and IV fluids.   Prescilla Sours, MD.  02/16/2023, 1:20 PM

## 2023-02-16 ENCOUNTER — Other Ambulatory Visit: Payer: Self-pay

## 2023-02-16 ENCOUNTER — Ambulatory Visit (HOSPITAL_COMMUNITY): Payer: BC Managed Care – PPO | Admitting: Anesthesiology

## 2023-02-16 ENCOUNTER — Encounter (HOSPITAL_COMMUNITY)
Admission: RE | Disposition: A | Discharge status: Home / Self Care | Payer: Self-pay | Source: Home / Self Care | Attending: Obstetrics & Gynecology

## 2023-02-16 ENCOUNTER — Encounter (HOSPITAL_COMMUNITY): Payer: Self-pay | Admitting: Obstetrics & Gynecology

## 2023-02-16 ENCOUNTER — Ambulatory Visit (HOSPITAL_COMMUNITY)
Admission: RE | Admit: 2023-02-16 | Discharge: 2023-02-16 | Disposition: A | Discharge status: Home / Self Care | Payer: BC Managed Care – PPO | Attending: Obstetrics & Gynecology | Admitting: Obstetrics & Gynecology

## 2023-02-16 DIAGNOSIS — N950 Postmenopausal bleeding: Secondary | ICD-10-CM | POA: Diagnosis not present

## 2023-02-16 DIAGNOSIS — Z7984 Long term (current) use of oral hypoglycemic drugs: Secondary | ICD-10-CM

## 2023-02-16 DIAGNOSIS — E1149 Type 2 diabetes mellitus with other diabetic neurological complication: Secondary | ICD-10-CM | POA: Diagnosis not present

## 2023-02-16 DIAGNOSIS — E119 Type 2 diabetes mellitus without complications: Secondary | ICD-10-CM | POA: Diagnosis not present

## 2023-02-16 DIAGNOSIS — G35 Multiple sclerosis: Secondary | ICD-10-CM

## 2023-02-16 DIAGNOSIS — D251 Intramural leiomyoma of uterus: Secondary | ICD-10-CM | POA: Diagnosis not present

## 2023-02-16 DIAGNOSIS — D250 Submucous leiomyoma of uterus: Secondary | ICD-10-CM | POA: Insufficient documentation

## 2023-02-16 HISTORY — DX: Type 2 diabetes mellitus without complications: E11.9

## 2023-02-16 HISTORY — PX: DILATATION & CURETTAGE/HYSTEROSCOPY WITH MYOSURE: SHX6511

## 2023-02-16 LAB — POCT PREGNANCY, URINE: Preg Test, Ur: NEGATIVE

## 2023-02-16 LAB — GLUCOSE, CAPILLARY
Glucose-Capillary: 157 mg/dL — ABNORMAL HIGH (ref 70–99)
Glucose-Capillary: 138 mg/dL — ABNORMAL HIGH (ref 70–99)

## 2023-02-16 LAB — TYPE AND SCREEN
ABO/RH(D): A POS
Antibody Screen: NEGATIVE

## 2023-02-16 LAB — CBC
HCT: 34.1 % — ABNORMAL LOW (ref 36.0–46.0)
Hemoglobin: 10.9 g/dL — ABNORMAL LOW (ref 12.0–15.0)
MCH: 27.3 pg (ref 26.0–34.0)
MCHC: 32 g/dL (ref 30.0–36.0)
MCV: 85.5 fL (ref 80.0–100.0)
Platelets: 305 10*3/uL (ref 150–400)
RBC: 3.99 MIL/uL (ref 3.87–5.11)
RDW: 14.5 % (ref 11.5–15.5)
WBC: 8.9 10*3/uL (ref 4.0–10.5)
nRBC: 0 % (ref 0.0–0.2)

## 2023-02-16 LAB — ABO/RH: ABO/RH(D): A POS

## 2023-02-16 LAB — BASIC METABOLIC PANEL
Anion gap: 13 (ref 5–15)
BUN: 7 mg/dL (ref 6–20)
CO2: 21 mmol/L — ABNORMAL LOW (ref 22–32)
Calcium: 8.7 mg/dL — ABNORMAL LOW (ref 8.9–10.3)
Chloride: 103 mmol/L (ref 98–111)
Creatinine, Ser: 0.66 mg/dL (ref 0.44–1.00)
GFR, Estimated: >60 mL/min (ref >60)
Glucose, Bld: 163 mg/dL — ABNORMAL HIGH (ref 70–99)
Potassium: 3.6 mmol/L (ref 3.5–5.1)
Sodium: 137 mmol/L (ref 135–145)

## 2023-02-16 SURGERY — DILATATION & CURETTAGE/HYSTEROSCOPY WITH MYOSURE
Anesthesia: General | Site: Uterus

## 2023-02-16 MED ORDER — SILVER NITRATE-POT NITRATE 75-25 % EX MISC
CUTANEOUS | Status: DC | PRN
  Administered 2023-02-16: 3

## 2023-02-16 MED ORDER — FENTANYL CITRATE (PF) 250 MCG/5ML IJ SOLN
INTRAMUSCULAR | Status: DC | PRN
  Administered 2023-02-16: 50 ug via INTRAVENOUS
  Administered 2023-02-16 (×2): 25 ug via INTRAVENOUS

## 2023-02-16 MED ORDER — PROPOFOL 10 MG/ML IV BOLUS
INTRAVENOUS | Status: DC | PRN
  Administered 2023-02-16: 170 mg via INTRAVENOUS

## 2023-02-16 MED ORDER — LACTATED RINGERS IV SOLN: INTRAVENOUS | Status: DC

## 2023-02-16 MED ORDER — BUPIVACAINE-EPINEPHRINE 0.5% -1:200000 IJ SOLN
INTRAMUSCULAR | Status: DC | PRN
  Administered 2023-02-16: 20 mL

## 2023-02-16 MED ORDER — SCOPOLAMINE 1 MG/3DAYS TD PT72
1.0000 | MEDICATED_PATCH | TRANSDERMAL | Status: DC
  Administered 2023-02-16: 1.5 mg via TRANSDERMAL
  Filled 2023-02-16: qty 1

## 2023-02-16 MED ORDER — AMISULPRIDE (ANTIEMETIC) 5 MG/2ML IV SOLN: 10.0000 mg | Freq: Once | INTRAVENOUS | Status: DC | PRN

## 2023-02-16 MED ORDER — ACETAMINOPHEN 10 MG/ML IV SOLN: 1000.0000 mg | Freq: Once | INTRAVENOUS | Status: DC | PRN

## 2023-02-16 MED ORDER — SODIUM CHLORIDE 0.9 % IR SOLN
Status: DC | PRN
  Administered 2023-02-16 (×3): 3000 mL

## 2023-02-16 MED ORDER — MIDAZOLAM HCL 2 MG/2ML IJ SOLN
INTRAMUSCULAR | Status: AC
  Filled 2023-02-16: qty 2

## 2023-02-16 MED ORDER — ACETAMINOPHEN 325 MG PO TABS: 325.0000 mg | ORAL_TABLET | ORAL | Status: DC | PRN

## 2023-02-16 MED ORDER — OXYCODONE HCL 5 MG/5ML PO SOLN: 5.0000 mg | Freq: Once | ORAL | Status: DC | PRN

## 2023-02-16 MED ORDER — ACETAMINOPHEN 500 MG PO TABS
1000.0000 mg | ORAL_TABLET | Freq: Once | ORAL | Status: AC
  Administered 2023-02-16: 1000 mg via ORAL
  Filled 2023-02-16: qty 2

## 2023-02-16 MED ORDER — MIDAZOLAM HCL 2 MG/2ML IJ SOLN
INTRAMUSCULAR | Status: DC | PRN
  Administered 2023-02-16: 2 mg via INTRAVENOUS

## 2023-02-16 MED ORDER — IBUPROFEN 600 MG PO TABS: 600.0000 mg | ORAL_TABLET | Freq: Four times a day (QID) | ORAL | 0 refills | Status: AC | PRN

## 2023-02-16 MED ORDER — HYDROCODONE-ACETAMINOPHEN 5-300 MG PO TABS: 1.0000 | ORAL_TABLET | ORAL | 0 refills | Status: AC | PRN

## 2023-02-16 MED ORDER — DEXAMETHASONE SODIUM PHOSPHATE 10 MG/ML IJ SOLN
INTRAMUSCULAR | Status: DC | PRN
  Administered 2023-02-16: 5 mg via INTRAVENOUS

## 2023-02-16 MED ORDER — CHLORHEXIDINE GLUCONATE 0.12 % MT SOLN
15.0000 mL | Freq: Once | OROMUCOSAL | Status: AC
  Administered 2023-02-16: 15 mL via OROMUCOSAL
  Filled 2023-02-16: qty 15

## 2023-02-16 MED ORDER — FENTANYL CITRATE (PF) 100 MCG/2ML IJ SOLN
INTRAMUSCULAR | Status: AC
  Filled 2023-02-16: qty 2

## 2023-02-16 MED ORDER — ORAL CARE MOUTH RINSE: 15.0000 mL | Freq: Once | OROMUCOSAL | Status: AC

## 2023-02-16 MED ORDER — POVIDONE-IODINE 10 % EX SWAB
2.0000 | Freq: Once | CUTANEOUS | Status: AC
  Administered 2023-02-16: 2 via TOPICAL

## 2023-02-16 MED ORDER — LIDOCAINE 2% (20 MG/ML) 5 ML SYRINGE
INTRAMUSCULAR | Status: DC | PRN
  Administered 2023-02-16: 60 mg via INTRAVENOUS

## 2023-02-16 MED ORDER — PROMETHAZINE HCL 25 MG/ML IJ SOLN: 6.2500 mg | INTRAMUSCULAR | Status: DC | PRN

## 2023-02-16 MED ORDER — FENTANYL CITRATE (PF) 100 MCG/2ML IJ SOLN: 25.0000 ug | INTRAMUSCULAR | Status: DC | PRN

## 2023-02-16 MED ORDER — BUPIVACAINE-EPINEPHRINE (PF) 0.5% -1:200000 IJ SOLN
INTRAMUSCULAR | Status: AC
  Filled 2023-02-16: qty 30

## 2023-02-16 MED ORDER — ONDANSETRON HCL 4 MG/2ML IJ SOLN
INTRAMUSCULAR | Status: DC | PRN
  Administered 2023-02-16: 4 mg via INTRAVENOUS

## 2023-02-16 MED ORDER — ACETAMINOPHEN 160 MG/5ML PO SOLN: 325.0000 mg | ORAL | Status: DC | PRN

## 2023-02-16 MED ORDER — OXYCODONE HCL 5 MG PO TABS: 5.0000 mg | ORAL_TABLET | Freq: Once | ORAL | Status: DC | PRN

## 2023-02-16 SURGICAL SUPPLY — 14 items
CANISTER SUCT 3000ML PPV (MISCELLANEOUS) ×2 IMPLANT
CATH ROBINSON RED A/P 16FR (CATHETERS) ×2 IMPLANT
DEVICE MYOSURE REACH (MISCELLANEOUS) ×1 IMPLANT
ELECT REM PT RETURN 9FT ADLT (ELECTROSURGICAL)
ELECTRODE REM PT RTRN 9FT ADLT (ELECTROSURGICAL) IMPLANT
GLOVE SURG SS PI 6.5 STRL IVOR (GLOVE) ×4 IMPLANT
GLOVE SURG UNDER POLY LF SZ7 (GLOVE) ×2 IMPLANT
GOWN STRL REUS W/ TWL LRG LVL3 (GOWN DISPOSABLE) ×4 IMPLANT
GOWN STRL REUS W/TWL LRG LVL3 (GOWN DISPOSABLE) ×4
PACK VAGINAL MINOR WOMEN LF (CUSTOM PROCEDURE TRAY) ×2 IMPLANT
PAD OB MATERNITY 4.3X12.25 (PERSONAL CARE ITEMS) ×2 IMPLANT
SET GENESYS HTA PROCERVA (MISCELLANEOUS) ×2 IMPLANT
TOWEL GREEN STERILE FF (TOWEL DISPOSABLE) ×4 IMPLANT
UNDERPAD 30X36 HEAVY ABSORB (UNDERPADS AND DIAPERS) ×2 IMPLANT

## 2023-02-16 NOTE — Anesthesia Procedure Notes (Cosign Needed)
Procedure Name: LMA Insertion Date/Time: 02/16/2023 1:55 PM  Performed by: Shelton Silvas, MDPre-anesthesia Checklist: Patient identified, Emergency Drugs available, Suction available and Patient being monitored Patient Re-evaluated:Patient Re-evaluated prior to induction Oxygen Delivery Method: Circle system utilized Preoxygenation: Pre-oxygenation with 100% oxygen Induction Type: IV induction LMA: LMA with gastric port inserted LMA Size: 4.0 Number of attempts: 1 Placement Confirmation: breath sounds checked- equal and bilateral and positive ETCO2 Dental Injury: Teeth and Oropharynx as per pre-operative assessment

## 2023-02-16 NOTE — Op Note (Addendum)
  Patient: [name] DOB: 1966/09/05 MRN: [id number]  PREOP DIAGNOSIS:  1. Uterus with submucosal and intramural fibroids.  2. Postmenopausal bleeding.   Post operative diagnosis: Same as above  Procedures: Dilation and Currettage hysteroscopy with Myosure.   Surgeon: Dr. Hoover Browns  Assistant: None  Anesthesiologist: General anesthesia- LMA  Complications: None  Fluid deficit:  495 cc  Urine: Straight catheterization  EBL: 5 cc.   Indications: 57 y/o P2 with postmenopausal bleeding and intracavitary lesion found on ultrasound, likely uterine fibroid here for dilation and currettage hysteroscopy with myosure.     Procedure:  Informed consent was obtained from the patient to undergo the procedure including risks of bleeding, infection and damage to organs.  She was taken to the operating room and anesthesia was administered without difficulty.  She was placed in dorsal lithotomy position, prepped with betadine.  She was straight catheterized.  She was draped in the usual sterile fashion.   An exam was then performed under anesthesia revealing an anteverted uterus and a closed cervix. There were no palpable adnexal masses. A graves speculum was used to view the cervix. Single-tooth tenaculum was placed on the anterior cervix.  20 cc of 0.25% marcaine with epinephrine was injected as a paracervical block.  The cervix was dilated to #15 pratt dilators and the hysteroscope was advanced through the internal cervical os. Bilateral tubal ostia were visualized and uterus noted to be angled to her right side.  The uterine cavity was noted to have two pink lesions located on bilateral sides in mid uterus, each about 1 to 2 cm long.  There was also a 0.5 cm lesion within the endocervical canal.  A second single tooth tenaculum was placed on the posterior cervix to help decrease fluid loss from the cervical os.  The lesions were shaved off with MYOSURE REACH device under direct visualization to  completely remove the intracavitary and endocervical lesions.  Sharp currettage was then performed to obtain endocervical and endometrial curettings.   All instruments were then removed and the patient was awoken from anesthesia and taken to recovery room in stable condition  Specimen: Endometrial lesion suspect fibroid, endometrial curettings and endocervical curettings.    Disposition: Stable to PACU.   Dr. Hoover Browns.  02/16/2023.

## 2023-02-16 NOTE — Anesthesia Preprocedure Evaluation (Addendum)
Anesthesia Evaluation  Patient identified by MRN, date of birth, ID band Patient awake    Reviewed: Allergy & Precautions, NPO status , Patient's Chart, lab work & pertinent test results  Airway Mallampati: II  TM Distance: >3 FB Neck ROM: Full    Dental  (+) Teeth Intact, Dental Advisory Given   Pulmonary neg pulmonary ROS   breath sounds clear to auscultation       Cardiovascular negative cardio ROS  Rhythm:Regular Rate:Normal     Neuro/Psych  Neuromuscular disease  negative psych ROS   GI/Hepatic negative GI ROS, Neg liver ROS,,,  Endo/Other  diabetes, Type 2, Oral Hypoglycemic Agents    Renal/GU negative Renal ROS     Musculoskeletal negative musculoskeletal ROS (+)    Abdominal   Peds  Hematology negative hematology ROS (+)   Anesthesia Other Findings - Multiple Sclerosis  Reproductive/Obstetrics                             Anesthesia Physical Anesthesia Plan  ASA: 3  Anesthesia Plan: General   Post-op Pain Management: Tylenol PO (pre-op)*   Induction: Intravenous  PONV Risk Score and Plan: 4 or greater and Ondansetron, Dexamethasone, Midazolam and Scopolamine patch - Pre-op  Airway Management Planned: LMA  Additional Equipment: None  Intra-op Plan:   Post-operative Plan: Extubation in OR  Informed Consent: I have reviewed the patients History and Physical, chart, labs and discussed the procedure including the risks, benefits and alternatives for the proposed anesthesia with the patient or authorized representative who has indicated his/her understanding and acceptance.     Dental advisory given  Plan Discussed with: CRNA  Anesthesia Plan Comments:        Anesthesia Quick Evaluation

## 2023-02-16 NOTE — Interval H&P Note (Signed)
History and Physical Interval Note:  02/16/2023 1:24 PM  [name]  has presented today for surgery, with the diagnosis of Postmenopausal bleeding and fibroids.  The various methods of treatment have been discussed with the patient and family. After consideration of risks, benefits and other options for treatment, the patient has consented to  Procedure(s) with comments: DILATATION & CURETTAGE/HYSTEROSCOPY WITH MYOSURE as a surgical intervention.  The patient's history has been reviewed, patient examined, no change in status, stable for surgery.  I have reviewed the patient's chart and labs.  Questions were answered to the patient's satisfaction.     Prescilla Sours,  MD.

## 2023-02-16 NOTE — Transfer of Care (Cosign Needed)
Immediate Anesthesia Transfer of Care Note  Patient: [name]  Procedure(s) Performed: DILATATION & CURETTAGE/HYSTEROSCOPY WITH MYOSURE (Uterus)  Patient Location: PACU  Anesthesia Type:General  Level of Consciousness: awake and drowsy  Airway & Oxygen Therapy: Patient Spontanous Breathing and Patient connected to face mask oxygen  Post-op Assessment: Report given to RN and Post -op Vital signs reviewed and stable  Post vital signs: Reviewed and stable  Last Vitals:  Vitals Value Taken Time  BP 121/50 02/16/23 1446  Temp    Pulse 99 02/16/23 1450  Resp 15 02/16/23 1450  SpO2 100 % 02/16/23 1450  Vitals shown include unvalidated device data.  Last Pain:  Vitals:   02/16/23 1308  TempSrc:   PainSc: 0-No pain         Complications: No notable events documented.

## 2023-02-16 NOTE — Discharge Instructions (Signed)
  [place], 1. Nothing in vagina x 2 weeks, i.e no sexual intercourse, no tampon usage.  2. Expect some vaginal bleeding for next several days, call me if with excessive bleeding requiring you to change a pad every hour.  3.  Expect some abdominal cramping, take tylenol/acetaminophen as needed.  You may also use ibuprofen if pain is not well controlled.  Use hydrocodone-acetaminophen if the first two are not working ensuring not to use more than 4000 mg of acetaminophen in 24 hrs from acetaminophen and hydrocodone-acetaminophen sources.  Call me if pain is intolerable despite medication use. 4. Please keep our upcoming scheduled office appointment.  5. Please call me if with any other concerns such as fever, chills, nausea, vomiting, problems with urination.   I wish you a quick recovery. Dr. Sallye Ober.  Phone [phone].

## 2023-02-17 ENCOUNTER — Encounter (HOSPITAL_COMMUNITY): Payer: Self-pay | Admitting: Obstetrics & Gynecology

## 2023-02-17 DIAGNOSIS — Z1212 Encounter for screening for malignant neoplasm of rectum: Secondary | ICD-10-CM | POA: Diagnosis not present

## 2023-02-17 DIAGNOSIS — Z1211 Encounter for screening for malignant neoplasm of colon: Secondary | ICD-10-CM | POA: Diagnosis not present

## 2023-02-17 LAB — SURGICAL PATHOLOGY

## 2023-02-17 NOTE — Anesthesia Postprocedure Evaluation (Signed)
Anesthesia Post Note  Patient: [name]  Procedure(s) Performed: DILATATION & CURETTAGE/HYSTEROSCOPY WITH MYOSURE (Uterus)     Patient location during evaluation: PACU Anesthesia Type: General Level of consciousness: awake and alert Pain management: pain level controlled Vital Signs Assessment: post-procedure vital signs reviewed and stable Respiratory status: spontaneous breathing, nonlabored ventilation, respiratory function stable and patient connected to nasal cannula oxygen Cardiovascular status: blood pressure returned to baseline and stable Postop Assessment: no apparent nausea or vomiting Anesthetic complications: no   No notable events documented.              Shelton Silvas

## 2023-02-23 DIAGNOSIS — I10 Essential (primary) hypertension: Secondary | ICD-10-CM | POA: Diagnosis not present

## 2023-02-23 DIAGNOSIS — E1165 Type 2 diabetes mellitus with hyperglycemia: Secondary | ICD-10-CM | POA: Diagnosis not present

## 2023-02-23 LAB — COLOGUARD: COLOGUARD: POSITIVE — AB

## 2023-03-03 DIAGNOSIS — D250 Submucous leiomyoma of uterus: Secondary | ICD-10-CM | POA: Diagnosis not present

## 2023-03-03 DIAGNOSIS — Z4889 Encounter for other specified surgical aftercare: Secondary | ICD-10-CM | POA: Diagnosis not present

## 2023-03-05 ENCOUNTER — Ambulatory Visit: Payer: BC Managed Care – PPO | Admitting: Neurology

## 2023-03-05 DIAGNOSIS — E1165 Type 2 diabetes mellitus with hyperglycemia: Secondary | ICD-10-CM | POA: Diagnosis not present

## 2023-03-10 ENCOUNTER — Ambulatory Visit: Payer: Self-pay

## 2023-03-10 ENCOUNTER — Other Ambulatory Visit: Payer: Self-pay

## 2023-03-10 ENCOUNTER — Ambulatory Visit
Admission: RE | Admit: 2023-03-10 | Discharge: 2023-03-10 | Disposition: A | Discharge status: Home / Self Care | Payer: Medicare Other | Source: Ambulatory Visit | Attending: Family Medicine | Admitting: Family Medicine

## 2023-03-10 VITALS — BP 134/82 | HR 102 | Temp 98.2°F | Resp 18

## 2023-03-10 DIAGNOSIS — R21 Rash and other nonspecific skin eruption: Secondary | ICD-10-CM

## 2023-03-10 MED ORDER — PREDNISONE 20 MG PO TABS: 40.0000 mg | ORAL_TABLET | Freq: Every day | ORAL | 0 refills | Status: AC

## 2023-03-10 NOTE — ED Triage Notes (Signed)
Pt here for possible abscess to right lower leg with hx of similar in past; pt sts taking antibiotics

## 2023-03-10 NOTE — ED Provider Notes (Signed)
[facility]    CSN: [id number] Arrival date & time: 03/10/23  1524      History   Chief Complaint Chief Complaint  Patient presents with   Blister    Boil on right leg - Entered by patient    HPI [name] is a 57 y.o. female.   HPI Here for what she deems an abscess on her right lower leg. She was on some antibiotics for some abnormal skin on both lower legs.  Then about 3 days ago she noted a large swelling on her right lower leg.  Primary care changed antibiotic.  It is not hurting and is there is no itching or fever.  She does have diabetes, and last A1c was 6.6 in epic. Past Medical History:  Diagnosis Date   COVID 09/15/2020   mild   Diabetes mellitus without complication (HCC)    Movement disorder    pt walks with walker, bilateral low extremities weakness due to MS. 01/28/2023   Multiple sclerosis (HCC)     Patient Active Problem List   Diagnosis Date Noted   Postmenopausal bleeding 02/13/2023   Patient refuses to take medication 12/09/2021   Edema 08/29/2020   Lymphedema 08/02/2020   Stress 08/02/2020   Gait abnormality 08/02/2020   Acute urinary tract infection 10/28/2018   High risk medication use 10/21/2016   Urinary frequency 04/22/2016   Multiple sclerosis (HCC) 02/01/2015   Gait disturbance 02/01/2015   Left foot drop 02/01/2015   Other fatigue 02/01/2015   DS (disseminated sclerosis) (HCC) 01/06/2013   Avitaminosis D 01/06/2013    Past Surgical History:  Procedure Laterality Date   CESAREAN SECTION     DILATATION & CURETTAGE/HYSTEROSCOPY WITH MYOSURE N/A 02/16/2023   Procedure: DILATATION & CURETTAGE/HYSTEROSCOPY WITH MYOSURE;  Surgeon: Hoover Browns, MD;  Location: [facility] OR;  Service: Gynecology;  Laterality: N/A;  Myosure   UTERINE FIBROID SURGERY     WISDOM TOOTH EXTRACTION      OB History   No obstetric history on file.      Home Medications    Prior to Admission medications   Medication Sig Start Date End Date  Taking? Authorizing Provider  predniSONE (DELTASONE) 20 MG tablet Take 2 tablets (40 mg total) by mouth daily with breakfast for 3 days. 03/10/23 03/13/23 Yes Sariyah Corcino, Janace Aris, MD  B-12 MICROLOZENGE 500 MCG SUBL Take 500 mcg by mouth 2 (two) times daily. 12/08/22   [provider]  Barberry-Oreg Grape-Goldenseal (BERBERINE COMPLEX PO) Take 1 tablet by mouth daily.    [provider]  Blood Glucose Monitoring Suppl (FREESTYLE LITE) w/Device KIT Inject 1 each into the skin 2 (two) times daily. dxE11.65 05/21/21   Ghumman, Lowella Fairy, NP  cephALEXin (KEFLEX) 500 MG capsule Take 500 mg by mouth 3 (three) times daily.    [provider]  Cholecalciferol (VITAMIN D3) 50 MCG (2000 UT) TABS Take 1 tablet (2,000 Units total) by mouth daily. Patient taking differently: Take 1,000 Units by mouth daily. 12/01/22   Sater, Pearletha Furl, MD  Cyanocobalamin ([employer] B12 QUICK DISSOLVE) 500 MCG LOZG Take 2 lozenges (1,000 mcg total) by mouth daily. Patient not taking: Reported on 02/11/2023 06/12/22   Asa Lente, MD  Dimethyl Fumarate 240 MG CPDR TAKE 1 CAPSULE TWICE A DAY 01/13/23   Sater, Pearletha Furl, MD  furosemide (LASIX) 20 MG tablet Take 1 tab by mouth daily as needed for leg edema Patient taking differently: Take 20 mg by mouth as needed for fluid  or edema. Take 1 tab by mouth daily as needed for leg edema 11/19/21   Arnette Felts, FNP  glucose blood (FREESTYLE LITE) test strip Use as instructed to check blood sugars 2 times per day dx:e11.65 09/02/21   Arnette Felts, FNP  HYDROcodone-Acetaminophen 5-300 MG TABS Take 1 tablet by mouth every 4 (four) hours as needed for up to 15 doses (severe pain). 02/16/23   Hoover Browns, MD  ibuprofen (ADVIL) 600 MG tablet Take 1 tablet (600 mg total) by mouth every 6 (six) hours as needed. 02/16/23   Hoover Browns, MD  Microlet Lancets MISC Use as directed to check blood sugars 2 times per day dx: e11.65 02/07/22   Arnette Felts, FNP  OVER THE COUNTER MEDICATION Take  1-2 capsules by mouth daily. Sea [employer]    [provider]  Probiotic Product (PROBIOTIC PO) Take 1 tablet by mouth daily. Good Ezmes    [provider]    Family History Family History  Problem Relation Age of Onset   Hypertension Mother    Hypertension Father    Diabetes Father     Social History Social History   Tobacco Use   Smoking status: Never    Passive exposure: Never   Smokeless tobacco: Never  Vaping Use   Vaping Use: Never used  Substance Use Topics   Alcohol use: No   Drug use: No     Allergies   Food, Milk (cow), Oxycodone hcl, and Soy allergy   Review of Systems Review of Systems   Physical Exam Triage Vital Signs ED Triage Vitals  Enc Vitals Group     BP 03/10/23 1546 134/82     Pulse Rate 03/10/23 1546 (!) 102     Resp 03/10/23 1546 18     Temp 03/10/23 1546 98.2 F (36.8 C)     Temp Source 03/10/23 1546 Oral     SpO2 03/10/23 1546 95 %     Weight --      Height --      Head Circumference --      Peak Flow --      Pain Score 03/10/23 1547 0     Pain Loc --      Pain Edu? --      Excl. in GC? --    No data found.  Updated Vital Signs BP 134/82 (BP Location: Left Arm)   Pulse (!) 102   Temp 98.2 F (36.8 C) (Oral)   Resp 18   LMP  (LMP Unknown) Comment: dos upreg negative  SpO2 95%   Visual Acuity Right Eye Distance:   Left Eye Distance:   Bilateral Distance:    Right Eye Near:   Left Eye Near:    Bilateral Near:     Physical Exam Vitals reviewed.  Constitutional:      General: She is not in acute distress.    Appearance: She is not ill-appearing, toxic-appearing or diaphoretic.  Skin:    Coloration: Skin is not pale.     Comments: On the lateral right lower leg about midway between the knee and ankle there is a large blister about 4 cm in diameter.  It appears to have clear fluid in it.  There is no surrounding erythema or induration.  It is not tense.  On both lower legs in the  circumferential distribution there are smaller lesions that for the most part seem to be blistering.  Most of them are about 5 mm or  so in diameter.  There is no erythema with those lesions.  Neurological:     Mental Status: She is alert and oriented to person, place, and time.  Psychiatric:        Behavior: Behavior normal.      UC Treatments / Results  Labs (all labs ordered are listed, but only abnormal results are displayed) Labs Reviewed - No data to display  EKG   Radiology No results found.  Procedures Procedures (including critical care time)  Medications Ordered in UC Medications - No data to display  Initial Impression / Assessment and Plan / UC Course  I have reviewed the triage vital signs and the nursing notes.  Pertinent labs & imaging results that were available during my care of the patient were reviewed by me and considered in my medical decision making (see chart for details).        Discussed with the patient that this is not an abscess that needs to be drained.  For the most part we usually do not rupture cysts or blisters as the covering is protected against infection.  Often they do spontaneously ruptured.  It sounds like some of the smaller blisters and improved in their appearance to her while she has been on the antibiotics, so have asked her to go ahead and finish that out.  I discussed with her that I think she has some kind of blistering skin condition, though it could be all due to venous stasis.  In case he could help any 3 days of prednisone is sent in to see if that will help the blistering at all.  She has seen a dermatologist in the past and she will endeavor to make an appointment with them, and have asked her to follow-up with her primary care. Final Clinical Impressions(s) / UC Diagnoses   Final diagnoses:  Blistering rash     Discharge Instructions      Take prednisone 20 mg--2 daily for 3 days; this medication is meant to  help any inflammation.  There are some blistering rash conditions that can be helped by prednisone.  The prednisone can raise your sugar.  Make sure you are drinking plenty of fluids and following a really good diet while you are taking it.  Please make an appointment with dermatology as were going to do Please follow-up with your primary care  Please finish your antibiotics  The blister will eventually pop.  For the most part we should leave the blisters alone until they spontaneously ruptured.           ED Prescriptions     Medication Sig Dispense Auth. Provider   predniSONE (DELTASONE) 20 MG tablet Take 2 tablets (40 mg total) by mouth daily with breakfast for 3 days. 6 tablet Marlinda Mike Janace Aris, MD      PDMP not reviewed this encounter.   Zenia Resides, MD 03/10/23 [phone]

## 2023-03-10 NOTE — Discharge Instructions (Signed)
Take prednisone 20 mg--2 daily for 3 days; this medication is meant to help any inflammation.  There are some blistering rash conditions that can be helped by prednisone.  The prednisone can raise your sugar.  Make sure you are drinking plenty of fluids and following a really good diet while you are taking it.  Please make an appointment with dermatology as were going to do Please follow-up with your primary care  Please finish your antibiotics  The blister will eventually pop.  For the most part we should leave the blisters alone until they spontaneously ruptured.

## 2023-03-12 DIAGNOSIS — I110 Hypertensive heart disease with heart failure: Secondary | ICD-10-CM | POA: Diagnosis not present

## 2023-03-12 DIAGNOSIS — R54 Age-related physical debility: Secondary | ICD-10-CM | POA: Diagnosis not present

## 2023-04-02 DIAGNOSIS — I5022 Chronic systolic (congestive) heart failure: Secondary | ICD-10-CM | POA: Diagnosis not present

## 2023-04-02 DIAGNOSIS — E1165 Type 2 diabetes mellitus with hyperglycemia: Secondary | ICD-10-CM | POA: Diagnosis not present

## 2023-04-02 DIAGNOSIS — I10 Essential (primary) hypertension: Secondary | ICD-10-CM | POA: Diagnosis not present

## 2023-04-08 DIAGNOSIS — R54 Age-related physical debility: Secondary | ICD-10-CM | POA: Diagnosis not present

## 2023-04-08 DIAGNOSIS — I110 Hypertensive heart disease with heart failure: Secondary | ICD-10-CM | POA: Diagnosis not present

## 2023-04-20 ENCOUNTER — Telehealth: Payer: Self-pay | Admitting: Family Medicine

## 2023-04-20 MED ORDER — DIMETHYL FUMARATE 240 MG PO CPDR: 1.0000 | DELAYED_RELEASE_CAPSULE | Freq: Two times a day (BID) | ORAL | 1 refills | Status: DC

## 2023-04-20 NOTE — Telephone Encounter (Signed)
Pt request refill for Dimethyl Fumarate 240 MG CPDR sent to [facility], [place]

## 2023-04-20 NOTE — Telephone Encounter (Signed)
Requested Prescriptions   Pending Prescriptions Disp Refills   Dimethyl Fumarate 240 MG CPDR 60 capsule 1    Sig: Take 1 capsule (240 mg total) by mouth 2 (two) times daily.   refilled

## 2023-04-28 NOTE — Telephone Encounter (Signed)
As a result of pt still waiting for her Dimethyl Fumarate 240 MG CPDR , she will be out before her mail order pharmacy delivers her medication.  Pt is asking if 7 days worth can be called in to [facility] [phone]  with a request that they be delivered to her, pt's address confirmed

## 2023-04-29 NOTE — Telephone Encounter (Signed)
Called patient to discuss, however patient voicemail is full.

## 2023-04-30 DIAGNOSIS — E1151 Type 2 diabetes mellitus with diabetic peripheral angiopathy without gangrene: Secondary | ICD-10-CM | POA: Diagnosis not present

## 2023-05-04 NOTE — Telephone Encounter (Signed)
Called and spoke with Patient who states she did have to go one day without medication but no has it and no new symptoms or problems to report at this time. Pt had no questions at this time but was encouraged to call back if questions arise.

## 2023-05-28 DIAGNOSIS — E1151 Type 2 diabetes mellitus with diabetic peripheral angiopathy without gangrene: Secondary | ICD-10-CM | POA: Diagnosis not present

## 2023-06-12 ENCOUNTER — Other Ambulatory Visit: Payer: Self-pay

## 2023-06-12 ENCOUNTER — Telehealth: Payer: Self-pay

## 2023-06-12 ENCOUNTER — Other Ambulatory Visit: Payer: Self-pay | Admitting: Neurology

## 2023-06-12 MED ORDER — DIMETHYL FUMARATE 240 MG PO CPDR: 1.0000 | DELAYED_RELEASE_CAPSULE | Freq: Two times a day (BID) | ORAL | 0 refills | Status: DC

## 2023-06-12 NOTE — Telephone Encounter (Signed)
I attempting to send a refill in for the patient but it is coming back indicated a PA is needed and will not send the script to the pharmacy. Will route to PA team for them to see if a request is needed. The information on file indicated the PA on file is valid until feb 2025.

## 2023-06-15 ENCOUNTER — Other Ambulatory Visit (HOSPITAL_COMMUNITY): Payer: Self-pay

## 2023-06-15 DIAGNOSIS — I110 Hypertensive heart disease with heart failure: Secondary | ICD-10-CM | POA: Diagnosis not present

## 2023-06-15 DIAGNOSIS — R54 Age-related physical debility: Secondary | ICD-10-CM | POA: Diagnosis not present

## 2023-06-18 ENCOUNTER — Other Ambulatory Visit: Payer: Self-pay

## 2023-06-18 ENCOUNTER — Other Ambulatory Visit (HOSPITAL_COMMUNITY): Payer: Self-pay

## 2023-06-18 ENCOUNTER — Other Ambulatory Visit: Payer: Self-pay | Admitting: Neurology

## 2023-06-18 NOTE — Telephone Encounter (Signed)
I'm unable to see a test claim result because this medication has to be filled at [facility]

## 2023-06-18 NOTE — Telephone Encounter (Signed)
Last seen on 09/02/22 Follow up scheduled on 07/27/23 Rx was sent 6 days ago

## 2023-06-18 NOTE — Telephone Encounter (Signed)
The prescription was sent to the [facility] today and refill notification showed it went through.

## 2023-06-22 DIAGNOSIS — I110 Hypertensive heart disease with heart failure: Secondary | ICD-10-CM | POA: Diagnosis not present

## 2023-06-22 DIAGNOSIS — E1151 Type 2 diabetes mellitus with diabetic peripheral angiopathy without gangrene: Secondary | ICD-10-CM | POA: Diagnosis not present

## 2023-07-16 DIAGNOSIS — I110 Hypertensive heart disease with heart failure: Secondary | ICD-10-CM | POA: Diagnosis not present

## 2023-07-16 DIAGNOSIS — R54 Age-related physical debility: Secondary | ICD-10-CM | POA: Diagnosis not present

## 2023-07-23 DIAGNOSIS — E1151 Type 2 diabetes mellitus with diabetic peripheral angiopathy without gangrene: Secondary | ICD-10-CM | POA: Diagnosis not present

## 2023-07-27 ENCOUNTER — Telehealth: Payer: Self-pay | Admitting: Family Medicine

## 2023-07-27 ENCOUNTER — Encounter: Payer: Self-pay | Admitting: Neurology

## 2023-07-27 ENCOUNTER — Telehealth (INDEPENDENT_AMBULATORY_CARE_PROVIDER_SITE_OTHER): Payer: BC Managed Care – PPO | Admitting: Neurology

## 2023-07-27 DIAGNOSIS — G35 Multiple sclerosis: Secondary | ICD-10-CM

## 2023-07-27 DIAGNOSIS — R5383 Other fatigue: Secondary | ICD-10-CM | POA: Diagnosis not present

## 2023-07-27 DIAGNOSIS — R350 Frequency of micturition: Secondary | ICD-10-CM | POA: Diagnosis not present

## 2023-07-27 DIAGNOSIS — R269 Unspecified abnormalities of gait and mobility: Secondary | ICD-10-CM | POA: Diagnosis not present

## 2023-07-27 DIAGNOSIS — Z79899 Other long term (current) drug therapy: Secondary | ICD-10-CM

## 2023-07-27 DIAGNOSIS — M21372 Foot drop, left foot: Secondary | ICD-10-CM

## 2023-07-27 NOTE — Telephone Encounter (Signed)
..   Pt understands that although there may be some limitations with this type of visit, we will take all precautions to reduce any security or privacy concerns.  Pt understands that this will be treated like an in office visit and we will file with pt's insurance, and there may be a patient responsible charge related to this service. ? ?

## 2023-07-27 NOTE — Progress Notes (Signed)
[facility]  PATIENT: [name] DOB: December 15, 1965  REFERRING CLINICIAN: Andi Devon HISTORY FROM: Patient and mother  REASON FOR VISIT: Multiple sclerosis   HISTORICAL  CHIEF COMPLAINT:  Chief Complaint  Patient presents with   Multiple Sclerosis    HISTORY OF PRESENT ILLNESS:  [name] is a 57 y.o. woman with relapsing remitting MS diagnosed in 1997.    Virtual Visit via Video Note I connected with [name] on 07/27/23 at 11:30 AM EST by a video enabled telemedicine application and verified that I am speaking with the correct person.  I discussed the limitations of evaluation and management by telemedicine and the availability of in person appointments. The patient expressed understanding and agreed to proceed.  Patient was at home; provider was in the office.  Update 07/27/2023: She continues on dimethyl fumarate 240 mg bid     Labs 02/12/2022 showed 1.9 lymphocyte count.   She will be having some bloodwork with PCP soon.  I asked her to request a CBC with differential   She feels her MS has been stable.  She has no exacerbations or new symptoms.   Her gait is stable and she uses a walker -- can do about 200 feet.   She has bilateral leg weakness, left mildly stronger than right.  She has some mild spasticity. She denies any weakness in her hands/arms.   She has ankle edema and is  wearing a vibrating wrap.     She can transfer to shower chair independently.    Bladder is doing well.    Vision is fine.     Fatigue is doing better last fe wmonths than last year.  She sleeps well most well.  She takes occasional naps.    She is eating well.   She takes vitamin D supplements.  She denies depression or anxiety.  Cognition is doing well.    She was diagnosed with NIDDM and was on metformin but stopped as she prefers to lose weight and eat better.    She is considering Ozempic.     PE: She is a well-developed well-nourished woman in no acute  distress.  The head is normocephalic and atraumatic.  Sclera are anicteric.  Visible skin appears normal.  The neck has a good range of motion.  .  She is alert and fully oriented with fluent speech and good attention, knowledge and memory.  Extraocular muscles are intact.  Facial strength is normal.   She appears to have normal strength in the arms.  Rapid alternating movements mildly reduced in the right hand compared to the left.  Finger-nose-finger was adequate.     MS History:    In 1997, she presented with poor gait and visual blurring. She had an MRI and a lumbar puncture. The studies were consistent with multiple sclerosis.  She turned down a disease modifying therapy at that time. Her legs and her walking got completely better and she was back at baseline after the steroids. Over the next 15 years or so she had no additional symptoms. She had worsening symptoms in the left leg that was affecting her balance.  . She opted not to go on medication initially.  She had some progression and started Tecfidera around 2014   Images: MRI Brain 06/17/2021 T2/FLAIR hyperintense foci in the brainstem and hemispheres in a pattern consistent with chronic demyelinating plaque associated with multiple sclerosis.  None of the foci appear to be acute.  Compared to the  MRI dated 06/17/2021, there are no new lesions.   Mild cortical and corpus callosum atrophy.   MRI cervical spine 06/17/2021 Multiple T2 hyperintense foci within the spinal cord in a pattern consistent with chronic demyelinating plaque associated with multiple sclerosis.  Compared to the MRI from 06/06/2015, allowing for differences in technique, there do not appear to be any new lesions.       At C3-C4, there are degenerative changes causing borderline spinal stenosis and moderately severe right foraminal narrowing with potential for right C4 nerve root compression.   At C5-C6, there are degenerative changes causing mild spinal stenosis and moderate  right foraminal narrowing but no nerve root compression.   Milder degenerative changes at C4-C5, and C6-C7 that do not cause spinal stenosis or nerve root compression.   MRI Brain 06/22/2017 shows T2/FLAIR hyperintense foci in the cerebral hemispheres and brainstem in a pattern and configuration consistent with chronic demyelinating plaque associated with multiple sclerosis. None of the foci appears to be acute. When compared to the MRI dated 06/06/2015, there is no interval change.     There is a normal enhancement pattern and there are no acute findings.   MRI cervical spine 06/22/2017 showed several lesions within the spinal cord adjacent to C2, C2-C3, C4 and C7 as described above. None of these enhance after contrast was administered. When compared to the MRI dated 06/06/2015, there has been no definite interval change.     Multilevel degenerative changes as detailed above. There is mild spinal stenosis at C3-C4.  There is moderate right foraminal narrowing at C3-C4 and at C5-C6. The degenerative changes encroach upon the right C4 and C6 nerve roots though there is no definite nerve root compression.    Compared to the MRI dated 06/06/2015, there is no definite interval change.    There is a normal enhancement pattern.   MRI thoracic spine 06/22/2017 showed hyperintense foci within the spinal cord adjacent to C7, T3, T4, T4-T5, T7, T8-T9, T10 and T11 as described above. None of these appear to enhance after contrast was administered. These are consistent with chronic demyelinating plaque associated with multiple sclerosis.    Small disc protrusion at T7-T8 that does not lead to any nerve root compression.    REVIEW OF SYSTEMS:  Constitutional: No fevers, chills, sweats, or change in appetite Eyes: No visual changes, double vision, eye pain Ear, nose and throat: No hearing loss, ear pain, nasal congestion, sore throat Cardiovascular: No chest pain, palpitations Respiratory:  No shortness of breath at  rest or with exertion.   No wheezes GastrointestinaI: No nausea, vomiting, diarrhea, abdominal pain, fecal incontinence Genitourinary:  No dysuria, urinary retention or frequency.  No nocturia. Musculoskeletal:  No neck pain.   She reports some back pain and occasionally proximal leg pain. Integumentary: No rash, pruritus, skin lesions Neurological: as above Psychiatric: She denies depression but notes that she is sometimes moody. There is no anxiety.  She notes some marital stress Endocrine: No palpitations, diaphoresis, change in appetite or increased thirst Hematologic/Lymphatic:  No anemia, purpura, petechiae. Allergic/Immunologic: No itchy/runny eyes, nasal congestion, recent allergic reactions, rashes  ALLERGIES: Allergies  Allergen Reactions   Food Other (See Comments)    WHAT FOOD CAUSES THIS REACTION?  Soy Milk Eyes swelling and throat swelling   Milk (Cow) Other (See Comments)   Oxycodone Hcl Hives   Soy Allergy Swelling    Other reaction(s): SWELLING  and throat swelling .   milk    HOME MEDICATIONS: Outpatient Medications  Prior to Visit  Medication Sig Dispense Refill   B-12 MICROLOZENGE 500 MCG SUBL Take 500 mcg by mouth 2 (two) times daily.     Barberry-Oreg Grape-Goldenseal (BERBERINE COMPLEX PO) Take 1 tablet by mouth daily.     Blood Glucose Monitoring Suppl (FREESTYLE LITE) w/Device KIT Inject 1 each into the skin 2 (two) times daily. dxE11.65 1 kit 1   cephALEXin (KEFLEX) 500 MG capsule Take 500 mg by mouth 3 (three) times daily.     Cholecalciferol (VITAMIN D3) 50 MCG (2000 UT) TABS Take 1 tablet (2,000 Units total) by mouth daily. (Patient taking differently: Take 1,000 Units by mouth daily.) 90 tablet 0   Cyanocobalamin ([employer] B12 QUICK DISSOLVE) 500 MCG LOZG Take 2 lozenges (1,000 mcg total) by mouth daily. (Patient not taking: Reported on 02/11/2023) 180 lozenge 1   Dimethyl Fumarate 240 MG CPDR TAKE 1 CAPSULE TWICE A DAY 180 capsule 0   furosemide (LASIX) 20 MG  tablet Take 1 tab by mouth daily as needed for leg edema (Patient taking differently: Take 20 mg by mouth as needed for fluid or edema. Take 1 tab by mouth daily as needed for leg edema) 30 tablet 11   glucose blood (FREESTYLE LITE) test strip Use as instructed to check blood sugars 2 times per day dx:e11.65 100 each 5   HYDROcodone-Acetaminophen 5-300 MG TABS Take 1 tablet by mouth every 4 (four) hours as needed for up to 15 doses (severe pain). 15 tablet 0   ibuprofen (ADVIL) 600 MG tablet Take 1 tablet (600 mg total) by mouth every 6 (six) hours as needed. 30 tablet 0   Microlet Lancets MISC Use as directed to check blood sugars 2 times per day dx: e11.65 100 each 11   OVER THE COUNTER MEDICATION Take 1-2 capsules by mouth daily. Sea [employer]     Probiotic Product (PROBIOTIC PO) Take 1 tablet by mouth daily. Good Ezmes     No facility-administered medications prior to visit.    PAST MEDICAL HISTORY: Past Medical History:  Diagnosis Date   COVID 09/15/2020   mild   Diabetes mellitus without complication (HCC)    Movement disorder    pt walks with walker, bilateral low extremities weakness due to MS. 01/28/2023   Multiple sclerosis (HCC)     PAST SURGICAL HISTORY: Past Surgical History:  Procedure Laterality Date   CESAREAN SECTION     DILATATION & CURETTAGE/HYSTEROSCOPY WITH MYOSURE N/A 02/16/2023   Procedure: DILATATION & CURETTAGE/HYSTEROSCOPY WITH MYOSURE;  Surgeon: Hoover Browns, MD;  Location: [facility] OR;  Service: Gynecology;  Laterality: N/A;  Myosure   UTERINE FIBROID SURGERY     WISDOM TOOTH EXTRACTION      FAMILY HISTORY: Family History  Problem Relation Age of Onset   Hypertension Mother    Hypertension Father    Diabetes Father     SOCIAL HISTORY:  Social History   Socioeconomic History   Marital status: Married    Spouse name: Not on file   Number of children: Not on file   Years of education: Not on file   Highest education level: Not on file   Occupational History   Not on file  Tobacco Use   Smoking status: Never    Passive exposure: Never   Smokeless tobacco: Never  Vaping Use   Vaping status: Never Used  Substance and Sexual Activity   Alcohol use: No   Drug use: No   Sexual activity: Not Currently  Other Topics  Concern   Not on file  Social History Narrative   Not on file   Social Determinants of Health   Financial Resource Strain: Low Risk  (08/29/2021)   Overall Financial Resource Strain (CARDIA)    Difficulty of Paying Living Expenses: Not hard at all  Food Insecurity: No Food Insecurity (10/03/2021)   Hunger Vital Sign    Worried About Running Out of Food in the Last Year: Never true    Ran Out of Food in the Last Year: Never true  Transportation Needs: No Transportation Needs (10/03/2021)   PRAPARE - Administrator, Civil Service (Medical): No    Lack of Transportation (Non-Medical): No  Physical Activity: Inactive (08/29/2021)   Exercise Vital Sign    Days of Exercise per Week: 0 days    Minutes of Exercise per Session: 0 min  Stress: No Stress Concern Present (08/29/2021)   [employer] of Occupational Health - Occupational Stress Questionnaire    Feeling of Stress : Not at all  Social Connections: Not on file  Intimate Partner Violence: Not on file     PHYSICAL EXAM (from 2023)  There were no vitals filed for this visit.    There is no height or weight on file to calculate BMI.   General: The patient is well-developed and well-nourished and in no acute distress.   Lymphedema bilaterally   Neurologic Exam  Mental status: The patient is alert and oriented x 3 at the time of the examination. The patient has apparent normal recent and remote memory, with an apparently normal attention span and concentration ability.   Speech is normal.  Cranial nerves: She has nystagmus consistent with partial bilateral INO's .  Facial symmetry is present. Facial strength and sensation is  normal. Trapezius and sternocleidomastoid strength is normal. No dysarthria is noted.   No obvious hearing deficits are noted.  Motor:  Muscle bulk is normal.  Muscle tone is increased in the legs, left greater than right.  Muscle tone was fairly normal in the arms.  Strength was normal in the right arm and 4+/5 in the upper left arm and 5/5 in the hand.  She as reduced rapid alternating movements in the right hand relative left.  In the legs, she has reduced strength bilaterally , 2+/5 hip extension on the right with 35 right quads and 3 to 4-/5  elsewhere on the left leg  Sensory: In the arms, there was normal sensation to touch and vibration. She had normal touch sensation in the legs but reduced vibratory sensation in the left leg.    Coordination: Cerebellar testing reveals good finger-nose-finger and is unable to do heel-to-shin  Gait and station: She needs strong bilateral support to stand and can walk with walker but not independently  Reflexes: Deep tendon reflexes are increased in knees relative to arms.     DIAGNOSTIC DATA (LABS, IMAGING, TESTING) - I reviewed patient records, labs, notes, testing and imaging myself where available.  Lab Results  Component Value Date   WBC 8.9 02/16/2023   HGB 10.9 (L) 02/16/2023   HCT 34.1 (L) 02/16/2023   MCV 85.5 02/16/2023   PLT 305 02/16/2023      ASSESSMENT AND PLAN  Multiple sclerosis (HCC)  High risk medication use  Gait abnormality  Other fatigue  Urinary frequency  Left foot drop  1.   She will continue Tecfidera for now.   She has SPMS.  No recent exacerbation  Consider a BTKi if available  next year or if we do an active form of SPMS.  She has been advised to get a CBC with differential when she next sees her primary care doctor.   2.   Continue supplementary vitamin D and B12.. 3.   She will use a walker around the house and for short distance outside.  And use wheelchair for longer distance.  outside the house. 4.     rtc 6 months, sooner if problems     Follow Up Instructions: I discussed the assessment and treatment plan with the patient. The patient was provided an opportunity to ask questions and all were answered. The patient agreed with the plan and demonstrated an understanding of the instructions.    The patient was advised to call back or seek an in-person evaluation if the symptoms worsen or if the condition fails to improve as anticipated.  I provided 15 minutes of non-face-to-face time during this encounter.  Alika Eppes A. Epimenio Foot, MD, PhD 07/27/2023, 3:32 PM Certified in Neurology, Clinical Neurophysiology, Sleep Medicine, Pain Medicine and Neuroimaging  [facility] [address] [place], [place] [address] [phone]

## 2023-08-18 DIAGNOSIS — E1151 Type 2 diabetes mellitus with diabetic peripheral angiopathy without gangrene: Secondary | ICD-10-CM | POA: Diagnosis not present

## 2023-08-18 DIAGNOSIS — I10 Essential (primary) hypertension: Secondary | ICD-10-CM | POA: Diagnosis not present

## 2023-08-18 DIAGNOSIS — I5022 Chronic systolic (congestive) heart failure: Secondary | ICD-10-CM | POA: Diagnosis not present

## 2023-08-19 ENCOUNTER — Telehealth: Payer: Self-pay | Admitting: Neurology

## 2023-08-19 NOTE — Telephone Encounter (Signed)
Noted  

## 2023-08-19 NOTE — Telephone Encounter (Signed)
Pt called stating that she has done the CBC Differential and was able to screen shot the results. Pt would like to inform the provider that she will be sending them momentarily on mychart.

## 2023-09-02 DIAGNOSIS — I110 Hypertensive heart disease with heart failure: Secondary | ICD-10-CM | POA: Diagnosis not present

## 2023-09-02 DIAGNOSIS — R54 Age-related physical debility: Secondary | ICD-10-CM | POA: Diagnosis not present

## 2023-09-14 ENCOUNTER — Other Ambulatory Visit: Payer: Self-pay | Admitting: Neurology

## 2023-09-14 DIAGNOSIS — D251 Intramural leiomyoma of uterus: Secondary | ICD-10-CM | POA: Diagnosis not present

## 2023-09-14 DIAGNOSIS — Z01411 Encounter for gynecological examination (general) (routine) with abnormal findings: Secondary | ICD-10-CM | POA: Diagnosis not present

## 2023-09-14 DIAGNOSIS — Z30430 Encounter for insertion of intrauterine contraceptive device: Secondary | ICD-10-CM | POA: Diagnosis not present

## 2023-09-14 DIAGNOSIS — Z1231 Encounter for screening mammogram for malignant neoplasm of breast: Secondary | ICD-10-CM | POA: Diagnosis not present

## 2023-09-14 DIAGNOSIS — N950 Postmenopausal bleeding: Secondary | ICD-10-CM | POA: Diagnosis not present

## 2023-09-14 DIAGNOSIS — Z124 Encounter for screening for malignant neoplasm of cervix: Secondary | ICD-10-CM | POA: Diagnosis not present

## 2023-09-14 NOTE — Telephone Encounter (Signed)
Last seen on 07/27/23 Follow up scheduled on 03/08/24

## 2023-09-21 DIAGNOSIS — E1151 Type 2 diabetes mellitus with diabetic peripheral angiopathy without gangrene: Secondary | ICD-10-CM | POA: Diagnosis not present

## 2023-10-05 DIAGNOSIS — I110 Hypertensive heart disease with heart failure: Secondary | ICD-10-CM | POA: Diagnosis not present

## 2023-10-05 DIAGNOSIS — R54 Age-related physical debility: Secondary | ICD-10-CM | POA: Diagnosis not present

## 2023-10-22 ENCOUNTER — Telehealth: Payer: Self-pay

## 2023-10-22 NOTE — Telephone Encounter (Signed)
*  [facility]  Pharmacy Patient Advocate Encounter   Received notification from Fax that prior authorization for Dimethyl Fumarate  240MG  dr capsules  is required/requested.   Insurance verification completed.   The patient is insured through [employer] .   Per test claim: PA required; PA submitted to above mentioned insurance via CoverMyMeds Key/confirmation #/EOC [id number] Status is pending

## 2023-10-23 ENCOUNTER — Encounter: Payer: Self-pay | Admitting: Internal Medicine

## 2023-10-26 DIAGNOSIS — E1165 Type 2 diabetes mellitus with hyperglycemia: Secondary | ICD-10-CM | POA: Diagnosis not present

## 2023-11-02 ENCOUNTER — Other Ambulatory Visit (HOSPITAL_COMMUNITY): Payer: Self-pay

## 2023-11-02 NOTE — Telephone Encounter (Signed)
Pharmacy Patient Advocate Encounter  Received notification from [facility] that Prior Authorization for Dimethyl Fumarate 240mg  has been APPROVED from 10/31/2023 to 10/29/2024. Unable to obtain price due to refill too soon rejection, last fill date 09/29/2023 next available fill date03/22/2025

## 2023-11-04 DIAGNOSIS — R54 Age-related physical debility: Secondary | ICD-10-CM | POA: Diagnosis not present

## 2023-11-04 DIAGNOSIS — I110 Hypertensive heart disease with heart failure: Secondary | ICD-10-CM | POA: Diagnosis not present

## 2023-11-23 DIAGNOSIS — Z7409 Other reduced mobility: Secondary | ICD-10-CM | POA: Diagnosis not present

## 2023-11-23 DIAGNOSIS — E1165 Type 2 diabetes mellitus with hyperglycemia: Secondary | ICD-10-CM | POA: Diagnosis not present

## 2023-11-23 DIAGNOSIS — K219 Gastro-esophageal reflux disease without esophagitis: Secondary | ICD-10-CM | POA: Diagnosis not present

## 2023-11-23 DIAGNOSIS — Z7985 Long-term (current) use of injectable non-insulin antidiabetic drugs: Secondary | ICD-10-CM | POA: Diagnosis not present

## 2023-11-23 DIAGNOSIS — Z7984 Long term (current) use of oral hypoglycemic drugs: Secondary | ICD-10-CM | POA: Diagnosis not present

## 2023-12-14 DIAGNOSIS — R54 Age-related physical debility: Secondary | ICD-10-CM | POA: Diagnosis not present

## 2023-12-14 DIAGNOSIS — D649 Anemia, unspecified: Secondary | ICD-10-CM | POA: Diagnosis not present

## 2023-12-14 DIAGNOSIS — I110 Hypertensive heart disease with heart failure: Secondary | ICD-10-CM | POA: Diagnosis not present

## 2023-12-18 DIAGNOSIS — R54 Age-related physical debility: Secondary | ICD-10-CM | POA: Diagnosis not present

## 2023-12-18 DIAGNOSIS — I110 Hypertensive heart disease with heart failure: Secondary | ICD-10-CM | POA: Diagnosis not present

## 2023-12-18 DIAGNOSIS — D649 Anemia, unspecified: Secondary | ICD-10-CM | POA: Diagnosis not present

## 2023-12-21 DIAGNOSIS — E559 Vitamin D deficiency, unspecified: Secondary | ICD-10-CM | POA: Diagnosis not present

## 2023-12-21 DIAGNOSIS — R609 Edema, unspecified: Secondary | ICD-10-CM | POA: Diagnosis not present

## 2023-12-21 DIAGNOSIS — K219 Gastro-esophageal reflux disease without esophagitis: Secondary | ICD-10-CM | POA: Diagnosis not present

## 2023-12-21 DIAGNOSIS — Z7409 Other reduced mobility: Secondary | ICD-10-CM | POA: Diagnosis not present

## 2023-12-21 DIAGNOSIS — D649 Anemia, unspecified: Secondary | ICD-10-CM | POA: Diagnosis not present

## 2024-01-07 ENCOUNTER — Other Ambulatory Visit (HOSPITAL_COMMUNITY): Payer: Self-pay

## 2024-01-07 ENCOUNTER — Telehealth: Payer: Self-pay | Admitting: Neurology

## 2024-01-07 MED ORDER — DIMETHYL FUMARATE 240 MG PO CPDR: 1.0000 | DELAYED_RELEASE_CAPSULE | Freq: Two times a day (BID) | ORAL | 2 refills | Status: DC

## 2024-01-07 NOTE — Telephone Encounter (Signed)
 I called patient and explained she can speak with [facility] to see if they have a patient assistant program she can use. I explained not PA is needed, it was done in Feb 2025 and approved.   Pt asked I send in refill and she will discuss cost of medication with pharmacy.

## 2024-01-07 NOTE — Telephone Encounter (Signed)
 Pt called stating that she is needing a refill on her Dimethyl Fumarate  240 MG CPDR but is wanting to discuss with nurse if it is possibly needing a PA due to them now charging her $100 which is more than what she normally pays. Please advise.

## 2024-01-18 DIAGNOSIS — G35 Multiple sclerosis: Secondary | ICD-10-CM | POA: Diagnosis not present

## 2024-01-18 DIAGNOSIS — I110 Hypertensive heart disease with heart failure: Secondary | ICD-10-CM | POA: Diagnosis not present

## 2024-01-18 DIAGNOSIS — E559 Vitamin D deficiency, unspecified: Secondary | ICD-10-CM | POA: Diagnosis not present

## 2024-01-18 DIAGNOSIS — K219 Gastro-esophageal reflux disease without esophagitis: Secondary | ICD-10-CM | POA: Diagnosis not present

## 2024-01-18 DIAGNOSIS — Z7409 Other reduced mobility: Secondary | ICD-10-CM | POA: Diagnosis not present

## 2024-01-18 DIAGNOSIS — D649 Anemia, unspecified: Secondary | ICD-10-CM | POA: Diagnosis not present

## 2024-01-18 DIAGNOSIS — E1165 Type 2 diabetes mellitus with hyperglycemia: Secondary | ICD-10-CM | POA: Diagnosis not present

## 2024-01-25 DIAGNOSIS — R54 Age-related physical debility: Secondary | ICD-10-CM | POA: Diagnosis not present

## 2024-01-25 DIAGNOSIS — D649 Anemia, unspecified: Secondary | ICD-10-CM | POA: Diagnosis not present

## 2024-01-25 DIAGNOSIS — I110 Hypertensive heart disease with heart failure: Secondary | ICD-10-CM | POA: Diagnosis not present

## 2024-02-09 ENCOUNTER — Telehealth: Payer: BC Managed Care – PPO | Admitting: Neurology

## 2024-02-15 DIAGNOSIS — K5909 Other constipation: Secondary | ICD-10-CM | POA: Diagnosis not present

## 2024-02-15 DIAGNOSIS — L853 Xerosis cutis: Secondary | ICD-10-CM | POA: Diagnosis not present

## 2024-02-15 DIAGNOSIS — R6889 Other general symptoms and signs: Secondary | ICD-10-CM | POA: Diagnosis not present

## 2024-02-15 DIAGNOSIS — G259 Extrapyramidal and movement disorder, unspecified: Secondary | ICD-10-CM | POA: Diagnosis not present

## 2024-02-15 DIAGNOSIS — M21372 Foot drop, left foot: Secondary | ICD-10-CM | POA: Diagnosis not present

## 2024-02-15 DIAGNOSIS — I10 Essential (primary) hypertension: Secondary | ICD-10-CM | POA: Diagnosis not present

## 2024-02-15 DIAGNOSIS — E1165 Type 2 diabetes mellitus with hyperglycemia: Secondary | ICD-10-CM | POA: Diagnosis not present

## 2024-02-15 DIAGNOSIS — E559 Vitamin D deficiency, unspecified: Secondary | ICD-10-CM | POA: Diagnosis not present

## 2024-02-16 ENCOUNTER — Telehealth: Payer: Self-pay | Admitting: Neurology

## 2024-02-16 NOTE — Telephone Encounter (Signed)
 [name], it looks like she may have spoken with you? Were you able to get a copy of labs drawn on 02/15/24?

## 2024-02-16 NOTE — Telephone Encounter (Signed)
 Is it okay if the pt use the lab work she had done for her next appointment, or do she still need to have her labs drawn by our office?

## 2024-02-16 NOTE — Telephone Encounter (Signed)
 Patient said, [facility] attempted to draw on May 5, but had to come back due to could not get enough blood. Came to my home and did blood work on 02/15/24 and have results.  Asking if Dr. Sater can use that instead of doing blood work when come for appointment.  Patient how can I send results to your office before the appointment. Transferred call to Medical Records.

## 2024-02-18 LAB — LAB REPORT - SCANNED
A1c: 6.4
EGFR: 101

## 2024-03-07 DIAGNOSIS — I110 Hypertensive heart disease with heart failure: Secondary | ICD-10-CM | POA: Diagnosis not present

## 2024-03-07 DIAGNOSIS — D649 Anemia, unspecified: Secondary | ICD-10-CM | POA: Diagnosis not present

## 2024-03-07 DIAGNOSIS — R54 Age-related physical debility: Secondary | ICD-10-CM | POA: Diagnosis not present

## 2024-03-08 ENCOUNTER — Telehealth (INDEPENDENT_AMBULATORY_CARE_PROVIDER_SITE_OTHER): Payer: BC Managed Care – PPO | Admitting: Neurology

## 2024-03-08 ENCOUNTER — Encounter: Payer: Self-pay | Admitting: Neurology

## 2024-03-08 DIAGNOSIS — R269 Unspecified abnormalities of gait and mobility: Secondary | ICD-10-CM

## 2024-03-08 DIAGNOSIS — I890 Lymphedema, not elsewhere classified: Secondary | ICD-10-CM

## 2024-03-08 DIAGNOSIS — G35 Multiple sclerosis: Secondary | ICD-10-CM

## 2024-03-08 DIAGNOSIS — R5383 Other fatigue: Secondary | ICD-10-CM

## 2024-03-08 DIAGNOSIS — Z79899 Other long term (current) drug therapy: Secondary | ICD-10-CM | POA: Diagnosis not present

## 2024-03-08 DIAGNOSIS — R350 Frequency of micturition: Secondary | ICD-10-CM | POA: Diagnosis not present

## 2024-03-08 NOTE — Progress Notes (Signed)
 [facility]  PATIENT: [name] DOB: 1966/03/09  REFERRING CLINICIAN: Suzen Lamer HISTORY FROM: Patient and mother  REASON FOR VISIT: Multiple sclerosis   HISTORICAL  CHIEF COMPLAINT:  No chief complaint on file.   HISTORY OF PRESENT ILLNESS:  [name] is a 58 y.o. woman with relapsing remitting MS diagnosed in 1997.    Virtual Visit via Video Note I connected with [name] on 03/08/24 at  3:00 PM EDT by a video enabled telemedicine application and verified that I am speaking with the correct person.  I discussed the limitations of evaluation and management by telemedicine and the availability of in person appointments. The patient expressed understanding and agreed to proceed.  Patient was at home; provider was in the office.  Update 03/08/2024: She continues on dimethyl fumarate  240 mg bid     Labs 02/12/2022 showed 1.9 lymphocyte count.   She will be having some bloodwork with PCP soon.  I asked her to request a CBC with differential   She feels her MS is stable.  She has no exacerbations or new symptoms.   Her gait is stable and she uses a walker -- can do about 200 feet.   She has bilateral leg weakness, left mildly stronger than right.  She has some mild spasticity. She has mild right arm weakness and reduced RAM.  Denies significant numbness.   She has ankle edema and is  wearing a wrap and taking a diuretic,.     She can transfer to shower chair independently.    Bladder is doing well.  Vision is fine.     Fatigue is mild and better than last year.,  .  She sleeps well most well.  She takes occasional naps.    She is eating well.   She takes vitamin D  supplements.  She denies depression or anxiety.  Cognition is doing well.    She was diagnosed with NIDDM and was on metformin  but stopped as she prefers to lose weight and eat better.    She is on  Mounjaro and has also lost 20 pounds    PE: She is a well-developed well-nourished  woman in no acute distress.  The head is normocephalic and atraumatic.  Sclera are anicteric.  Visible skin appears normal.  The neck has a good range of motion.  .  She is alert and fully oriented with fluent speech and good attention, knowledge and memory.  Extraocular muscles are intact.  Facial strength is normal.   She appears to have normal strength in the arms.  Rapid alternating movements mildly reduced in the right hand compared to the left.  Finger-nose-finger was smooth.     MS History:    In 1997, she presented with poor gait and visual blurring. She had an MRI and a lumbar puncture. The studies were consistent with multiple sclerosis.  She turned down a disease modifying therapy at that time. Her legs and her walking got completely better and she was back at baseline after the steroids. Over the next 15 years or so she had no additional symptoms. She had worsening symptoms in the left leg that was affecting her balance.  . She opted not to go on medication initially.  She had some progression and started Tecfidera  around 2014   Images: MRI Brain 06/17/2021 T2/FLAIR hyperintense foci in the brainstem and hemispheres in a pattern consistent with chronic demyelinating plaque associated with multiple sclerosis.  None of the foci appear  to be acute.  Compared to the MRI dated 06/17/2021, there are no new lesions.   Mild cortical and corpus callosum atrophy.   MRI cervical spine 06/17/2021 Multiple T2 hyperintense foci within the spinal cord in a pattern consistent with chronic demyelinating plaque associated with multiple sclerosis.  Compared to the MRI from 06/06/2015, allowing for differences in technique, there do not appear to be any new lesions.       At C3-C4, there are degenerative changes causing borderline spinal stenosis and moderately severe right foraminal narrowing with potential for right C4 nerve root compression.   At C5-C6, there are degenerative changes causing mild spinal stenosis  and moderate right foraminal narrowing but no nerve root compression.   Milder degenerative changes at C4-C5, and C6-C7 that do not cause spinal stenosis or nerve root compression.   MRI Brain 06/22/2017 shows T2/FLAIR hyperintense foci in the cerebral hemispheres and brainstem in a pattern and configuration consistent with chronic demyelinating plaque associated with multiple sclerosis. None of the foci appears to be acute. When compared to the MRI dated 06/06/2015, there is no interval change.     There is a normal enhancement pattern and there are no acute findings.   MRI cervical spine 06/22/2017 showed several lesions within the spinal cord adjacent to C2, C2-C3, C4 and C7 as described above. None of these enhance after contrast was administered. When compared to the MRI dated 06/06/2015, there has been no definite interval change.     Multilevel degenerative changes as detailed above. There is mild spinal stenosis at C3-C4.  There is moderate right foraminal narrowing at C3-C4 and at C5-C6. The degenerative changes encroach upon the right C4 and C6 nerve roots though there is no definite nerve root compression.    Compared to the MRI dated 06/06/2015, there is no definite interval change.    There is a normal enhancement pattern.   MRI thoracic spine 06/22/2017 showed hyperintense foci within the spinal cord adjacent to C7, T3, T4, T4-T5, T7, T8-T9, T10 and T11 as described above. None of these appear to enhance after contrast was administered. These are consistent with chronic demyelinating plaque associated with multiple sclerosis.    Small disc protrusion at T7-T8 that does not lead to any nerve root compression.    REVIEW OF SYSTEMS:  Constitutional: No fevers, chills, sweats, or change in appetite Eyes: No visual changes, double vision, eye pain Ear, nose and throat: No hearing loss, ear pain, nasal congestion, sore throat Cardiovascular: No chest pain, palpitations Respiratory:  No shortness  of breath at rest or with exertion.   No wheezes GastrointestinaI: No nausea, vomiting, diarrhea, abdominal pain, fecal incontinence Genitourinary:  No dysuria, urinary retention or frequency.  No nocturia. Musculoskeletal:  No neck pain.   She reports some back pain and occasionally proximal leg pain. Integumentary: No rash, pruritus, skin lesions Neurological: as above Psychiatric: She denies depression but notes that she is sometimes moody. There is no anxiety.  She notes some marital stress Endocrine: No palpitations, diaphoresis, change in appetite or increased thirst Hematologic/Lymphatic:  No anemia, purpura, petechiae. Allergic/Immunologic: No itchy/runny eyes, nasal congestion, recent allergic reactions, rashes  ALLERGIES: Allergies  Allergen Reactions   Food Other (See Comments)    WHAT FOOD CAUSES THIS REACTION?  Soy Milk Eyes swelling and throat swelling   Milk (Cow) Other (See Comments)   Oxycodone  Hcl Hives   Soy Allergy (Obsolete) Swelling    Other reaction(s): SWELLING  and throat swelling .  milk    HOME MEDICATIONS: Outpatient Medications Prior to Visit  Medication Sig Dispense Refill   B-12 MICROLOZENGE 500 MCG SUBL Take 500 mcg by mouth 2 (two) times daily.     Barberry-Oreg Grape-Goldenseal (BERBERINE COMPLEX PO) Take 1 tablet by mouth daily.     Blood Glucose Monitoring Suppl (FREESTYLE LITE) w/Device KIT Inject 1 each into the skin 2 (two) times daily. dxE11.65 1 kit 1   cephALEXin (KEFLEX) 500 MG capsule Take 500 mg by mouth 3 (three) times daily.     Cholecalciferol  (VITAMIN D3) 50 MCG (2000 UT) TABS Take 1 tablet (2,000 Units total) by mouth daily. (Patient taking differently: Take 1,000 Units by mouth daily.) 90 tablet 0   Cyanocobalamin  ([employer]S B12 QUICK DISSOLVE) 500 MCG LOZG Take 2 lozenges (1,000 mcg total) by mouth daily. (Patient not taking: Reported on 02/11/2023) 180 lozenge 1   Dimethyl Fumarate  240 MG CPDR Take 1 capsule (240 mg total) by mouth 2  (two) times daily. 60 capsule 2   furosemide  (LASIX ) 20 MG tablet Take 1 tab by mouth daily as needed for leg edema (Patient taking differently: Take 20 mg by mouth as needed for fluid or edema. Take 1 tab by mouth daily as needed for leg edema) 30 tablet 11   glucose blood (FREESTYLE LITE) test strip Use as instructed to check blood sugars 2 times per day dx:e11.65 100 each 5   HYDROcodone -Acetaminophen  5-300 MG TABS Take 1 tablet by mouth every 4 (four) hours as needed for up to 15 doses (severe pain). 15 tablet 0   ibuprofen  (ADVIL ) 600 MG tablet Take 1 tablet (600 mg total) by mouth every 6 (six) hours as needed. 30 tablet 0   Microlet Lancets MISC Use as directed to check blood sugars 2 times per day dx: e11.65 100 each 11   OVER THE COUNTER MEDICATION Take 1-2 capsules by mouth daily. Sea [employer]     Probiotic Product (PROBIOTIC PO) Take 1 tablet by mouth daily. Good Ezmes     No facility-administered medications prior to visit.    PAST MEDICAL HISTORY: Past Medical History:  Diagnosis Date   COVID 09/15/2020   mild   Diabetes mellitus without complication (HCC)    Movement disorder    pt walks with walker, bilateral low extremities weakness due to MS. 01/28/2023   Multiple sclerosis (HCC)     PAST SURGICAL HISTORY: Past Surgical History:  Procedure Laterality Date   CESAREAN SECTION     DILATATION & CURETTAGE/HYSTEROSCOPY WITH MYOSURE N/A 02/16/2023   Procedure: DILATATION & CURETTAGE/HYSTEROSCOPY WITH MYOSURE;  Surgeon: Gloriann Chick, MD;  Location: [facility] OR;  Service: Gynecology;  Laterality: N/A;  Myosure   UTERINE FIBROID SURGERY     WISDOM TOOTH EXTRACTION      FAMILY HISTORY: Family History  Problem Relation Age of Onset   Hypertension Mother    Hypertension Father    Diabetes Father     SOCIAL HISTORY:  Social History   Socioeconomic History   Marital status: Married    Spouse name: Not on file   Number of children: Not on file   Years of  education: Not on file   Highest education level: Not on file  Occupational History   Not on file  Tobacco Use   Smoking status: Never    Passive exposure: Never   Smokeless tobacco: Never  Vaping Use   Vaping status: Never Used  Substance and Sexual Activity   Alcohol use: No  Drug use: No   Sexual activity: Not Currently  Other Topics Concern   Not on file  Social History Narrative   Not on file   Social Drivers of Health   Financial Resource Strain: Low Risk  (08/29/2021)   Overall Financial Resource Strain (CARDIA)    Difficulty of Paying Living Expenses: Not hard at all  Food Insecurity: No Food Insecurity (10/03/2021)   Hunger Vital Sign    Worried About Running Out of Food in the Last Year: Never true    Ran Out of Food in the Last Year: Never true  Transportation Needs: No Transportation Needs (10/03/2021)   PRAPARE - Administrator, Civil Service (Medical): No    Lack of Transportation (Non-Medical): No  Physical Activity: Inactive (08/29/2021)   Exercise Vital Sign    Days of Exercise per Week: 0 days    Minutes of Exercise per Session: 0 min  Stress: No Stress Concern Present (08/29/2021)   [employer] of Occupational Health - Occupational Stress Questionnaire    Feeling of Stress : Not at all  Social Connections: Not on file  Intimate Partner Violence: Not on file     PHYSICAL EXAM (from 2023)  There were no vitals filed for this visit.    There is no height or weight on file to calculate BMI.   General: The patient is well-developed and well-nourished and in no acute distress.   Lymphedema bilaterally   Neurologic Exam  Mental status: The patient is alert and oriented x 3 at the time of the examination. The patient has apparent normal recent and remote memory, with an apparently normal attention span and concentration ability.   Speech is normal.  Cranial nerves: She has nystagmus consistent with partial bilateral INO's .   Facial symmetry is present. Facial strength and sensation is normal. Trapezius and sternocleidomastoid strength is normal. No dysarthria is noted.   No obvious hearing deficits are noted.  Motor:  Muscle bulk is normal.  Muscle tone is increased in the legs, left greater than right.  Muscle tone was fairly normal in the arms.  Strength was normal in the right arm and 4+/5 in the upper left arm and 5/5 in the hand.  She as reduced rapid alternating movements in the right hand relative left.  In the legs, she has reduced strength bilaterally , 2+/5 hip extension on the right with 35 right quads and 3 to 4-/5  elsewhere on the left leg  Sensory: In the arms, there was normal sensation to touch and vibration. She had normal touch sensation in the legs but reduced vibratory sensation in the left leg.    Coordination: Cerebellar testing reveals good finger-nose-finger and is unable to do heel-to-shin  Gait and station: She needs strong bilateral support to stand and can walk with walker but not independently  Reflexes: Deep tendon reflexes are increased in knees relative to arms.     DIAGNOSTIC DATA (LABS, IMAGING, TESTING) - I reviewed patient records, labs, notes, testing and imaging myself where available.  Lab Results  Component Value Date   WBC 8.9 02/16/2023   HGB 10.9 (L) 02/16/2023   HCT 34.1 (L) 02/16/2023   MCV 85.5 02/16/2023   PLT 305 02/16/2023      ASSESSMENT AND PLAN  Multiple sclerosis (HCC)  High risk medication use  Gait abnormality  Other fatigue  Urinary frequency  Lymphedema  1.   She will continue Tecfidera  for now.   She has a  relapsing form of SPMS with no recent exacerbation while on DMF.     Consider a BTKi if available in the future    She will forward lab results to us .  She has been advised to get a CBC with differential when she next sees her primary care doctor.    Check MRI brain to see if breakthrough activity present.   2.   Continue  supplementary vitamin D  and B12.. 3.   She will use a walker around the house and for short distance outside.    4.    rtc 6 months, sooner if problems     Follow Up Instructions: I discussed the assessment and treatment plan with the patient. The patient was provided an opportunity to ask questions and all were answered. The patient agreed with the plan and demonstrated an understanding of the instructions.    The patient was advised to call back or seek an in-person evaluation if the symptoms worsen or if the condition fails to improve as anticipated.  I provided 15 minutes of non-face-to-face time during this encounter.  Darchelle Nunes A. Vear, MD, PhD 03/08/2024, 3:23 PM Certified in Neurology, Clinical Neurophysiology, Sleep Medicine, Pain Medicine and Neuroimaging  [facility] [address] [place], [place] [address] [phone]

## 2024-03-09 ENCOUNTER — Telehealth: Payer: Self-pay | Admitting: Neurology

## 2024-03-09 NOTE — Telephone Encounter (Signed)
 no auth required for either insurance sent to [facility] [phone]

## 2024-03-11 ENCOUNTER — Telehealth: Payer: Self-pay | Admitting: Neurology

## 2024-03-11 NOTE — Telephone Encounter (Signed)
 02/18/2024 outside labs: Thyroid  panel, vitamin D , BMP, CBC with differential were normal or unremarkable.[name]

## 2024-03-14 ENCOUNTER — Telehealth: Payer: Self-pay | Admitting: Pharmacist

## 2024-03-14 DIAGNOSIS — E1165 Type 2 diabetes mellitus with hyperglycemia: Secondary | ICD-10-CM | POA: Diagnosis not present

## 2024-03-14 NOTE — Progress Notes (Signed)
 Patient was identified as falling into the [place] Measure - Diabetes.   Patient was: Attribution and/or data issue.  Validation/Investigation needed.  Explanation:  Claims indicate she is seen by [facility] in [place], [place].

## 2024-07-11 ENCOUNTER — Other Ambulatory Visit: Payer: Self-pay | Admitting: *Deleted

## 2024-07-11 DIAGNOSIS — G35D Multiple sclerosis, unspecified: Secondary | ICD-10-CM

## 2024-07-11 MED ORDER — DIMETHYL FUMARATE 240 MG PO CPDR
1.0000 | DELAYED_RELEASE_CAPSULE | Freq: Two times a day (BID) | ORAL | 2 refills | Status: AC
Start: 2024-07-11 — End: ?

## 2024-07-11 NOTE — Progress Notes (Signed)
 Received fax from [facility] asking for refill on dimethyl fumarate . E-scribed

## 2024-08-10 ENCOUNTER — Other Ambulatory Visit

## 2024-08-18 ENCOUNTER — Ambulatory Visit
Admission: RE | Admit: 2024-08-18 | Discharge: 2024-08-18 | Disposition: A | Source: Ambulatory Visit | Attending: Neurology | Admitting: Neurology

## 2024-08-18 DIAGNOSIS — G35D Multiple sclerosis, unspecified: Secondary | ICD-10-CM

## 2024-08-18 DIAGNOSIS — R269 Unspecified abnormalities of gait and mobility: Secondary | ICD-10-CM

## 2024-08-18 MED ORDER — GADOPICLENOL 0.5 MMOL/ML IV SOLN
10.0000 mL | Freq: Once | INTRAVENOUS | Status: AC | PRN
  Administered 2024-08-18: 10 mL via INTRAVENOUS

## 2024-08-19 ENCOUNTER — Ambulatory Visit: Payer: Self-pay | Admitting: Neurology

## 2024-08-19 DIAGNOSIS — R269 Unspecified abnormalities of gait and mobility: Secondary | ICD-10-CM | POA: Diagnosis not present

## 2024-08-19 DIAGNOSIS — G35D Multiple sclerosis, unspecified: Secondary | ICD-10-CM | POA: Diagnosis not present

## 2024-09-19 ENCOUNTER — Telehealth: Payer: Self-pay | Admitting: Neurology

## 2024-09-19 NOTE — Telephone Encounter (Signed)
 Pt is requesting to be called if an apt open for any Monday in January   in the afternoon Pt  transportation may not be able tto bring Pt on the time scheduled the patient is added to wait list

## 2024-10-03 NOTE — Telephone Encounter (Signed)
 Patient to reschedule due to transportation issues.

## 2024-10-05 ENCOUNTER — Ambulatory Visit: Admitting: Neurology

## 2025-05-11 ENCOUNTER — Ambulatory Visit: Admitting: Neurology
